# Patient Record
Sex: Female | Born: 1945 | Race: White | Hispanic: No | Marital: Married | Smoking: Former smoker
Health system: Southern US, Community
[De-identification: ages and names within clinical notes are randomized; demographics above are authoritative.]

## PROBLEM LIST (undated history)

## (undated) DIAGNOSIS — Z8719 Personal history of other diseases of the digestive system: Secondary | ICD-10-CM

## (undated) DIAGNOSIS — Z9981 Dependence on supplemental oxygen: Secondary | ICD-10-CM

## (undated) DIAGNOSIS — R0902 Hypoxemia: Secondary | ICD-10-CM

## (undated) DIAGNOSIS — J189 Pneumonia, unspecified organism: Secondary | ICD-10-CM

## (undated) DIAGNOSIS — T4145XA Adverse effect of unspecified anesthetic, initial encounter: Secondary | ICD-10-CM

## (undated) DIAGNOSIS — K219 Gastro-esophageal reflux disease without esophagitis: Secondary | ICD-10-CM

## (undated) DIAGNOSIS — M810 Age-related osteoporosis without current pathological fracture: Secondary | ICD-10-CM

## (undated) DIAGNOSIS — R519 Headache, unspecified: Secondary | ICD-10-CM

## (undated) DIAGNOSIS — T8859XA Other complications of anesthesia, initial encounter: Secondary | ICD-10-CM

## (undated) DIAGNOSIS — I700 Atherosclerosis of aorta: Secondary | ICD-10-CM

## (undated) DIAGNOSIS — I2510 Atherosclerotic heart disease of native coronary artery without angina pectoris: Secondary | ICD-10-CM

## (undated) DIAGNOSIS — E785 Hyperlipidemia, unspecified: Secondary | ICD-10-CM

## (undated) DIAGNOSIS — I10 Essential (primary) hypertension: Secondary | ICD-10-CM

## (undated) DIAGNOSIS — J439 Emphysema, unspecified: Secondary | ICD-10-CM

## (undated) DIAGNOSIS — M1990 Unspecified osteoarthritis, unspecified site: Secondary | ICD-10-CM

## (undated) DIAGNOSIS — K429 Umbilical hernia without obstruction or gangrene: Secondary | ICD-10-CM

## (undated) DIAGNOSIS — R0600 Dyspnea, unspecified: Secondary | ICD-10-CM

## (undated) DIAGNOSIS — J449 Chronic obstructive pulmonary disease, unspecified: Secondary | ICD-10-CM

## (undated) HISTORY — DX: Age-related osteoporosis without current pathological fracture: M81.0

## (undated) HISTORY — PX: ABDOMINAL HYSTERECTOMY: SHX81

## (undated) HISTORY — PX: EYE SURGERY: SHX253

## (undated) HISTORY — PX: FOOT SURGERY: SHX648

## (undated) HISTORY — DX: Essential (primary) hypertension: I10

## (undated) HISTORY — DX: Chronic obstructive pulmonary disease, unspecified: J44.9

## (undated) HISTORY — DX: Hyperlipidemia, unspecified: E78.5

## (undated) HISTORY — PX: CHOLECYSTECTOMY: SHX55

## (undated) HISTORY — DX: Unspecified osteoarthritis, unspecified site: M19.90

## (undated) HISTORY — DX: Emphysema, unspecified: J43.9

---

## 2006-05-24 ENCOUNTER — Ambulatory Visit: Payer: Self-pay | Admitting: Specialist

## 2006-06-10 ENCOUNTER — Ambulatory Visit: Payer: Self-pay | Admitting: Specialist

## 2007-01-27 ENCOUNTER — Ambulatory Visit: Payer: Self-pay | Admitting: Family Medicine

## 2007-03-25 ENCOUNTER — Ambulatory Visit: Payer: Self-pay | Admitting: Internal Medicine

## 2007-11-09 ENCOUNTER — Ambulatory Visit: Payer: Self-pay | Admitting: Internal Medicine

## 2008-12-05 ENCOUNTER — Ambulatory Visit: Payer: Self-pay | Admitting: Specialist

## 2009-01-25 ENCOUNTER — Ambulatory Visit: Payer: Self-pay | Admitting: Anesthesiology

## 2009-03-01 ENCOUNTER — Ambulatory Visit: Payer: Self-pay | Admitting: Anesthesiology

## 2009-03-28 ENCOUNTER — Ambulatory Visit: Payer: Self-pay | Admitting: Anesthesiology

## 2009-05-22 ENCOUNTER — Inpatient Hospital Stay: Payer: Self-pay | Admitting: Internal Medicine

## 2010-07-14 ENCOUNTER — Ambulatory Visit: Payer: Self-pay | Admitting: Internal Medicine

## 2011-04-21 DIAGNOSIS — H903 Sensorineural hearing loss, bilateral: Secondary | ICD-10-CM | POA: Diagnosis not present

## 2011-04-21 DIAGNOSIS — H60509 Unspecified acute noninfective otitis externa, unspecified ear: Secondary | ICD-10-CM | POA: Diagnosis not present

## 2011-04-21 DIAGNOSIS — H6120 Impacted cerumen, unspecified ear: Secondary | ICD-10-CM | POA: Diagnosis not present

## 2011-04-21 DIAGNOSIS — H9319 Tinnitus, unspecified ear: Secondary | ICD-10-CM | POA: Diagnosis not present

## 2011-05-07 DIAGNOSIS — J209 Acute bronchitis, unspecified: Secondary | ICD-10-CM | POA: Diagnosis not present

## 2011-05-07 DIAGNOSIS — J438 Other emphysema: Secondary | ICD-10-CM | POA: Diagnosis not present

## 2011-05-07 DIAGNOSIS — J449 Chronic obstructive pulmonary disease, unspecified: Secondary | ICD-10-CM | POA: Diagnosis not present

## 2011-06-16 ENCOUNTER — Ambulatory Visit: Payer: Self-pay | Admitting: Gastroenterology

## 2011-08-20 DIAGNOSIS — J438 Other emphysema: Secondary | ICD-10-CM | POA: Diagnosis not present

## 2011-08-20 DIAGNOSIS — J449 Chronic obstructive pulmonary disease, unspecified: Secondary | ICD-10-CM | POA: Diagnosis not present

## 2011-08-24 DIAGNOSIS — E785 Hyperlipidemia, unspecified: Secondary | ICD-10-CM | POA: Diagnosis not present

## 2011-08-24 DIAGNOSIS — I10 Essential (primary) hypertension: Secondary | ICD-10-CM | POA: Diagnosis not present

## 2011-08-24 DIAGNOSIS — N390 Urinary tract infection, site not specified: Secondary | ICD-10-CM | POA: Diagnosis not present

## 2012-01-29 DIAGNOSIS — N6489 Other specified disorders of breast: Secondary | ICD-10-CM | POA: Diagnosis not present

## 2012-01-29 DIAGNOSIS — R928 Other abnormal and inconclusive findings on diagnostic imaging of breast: Secondary | ICD-10-CM | POA: Diagnosis not present

## 2012-01-29 DIAGNOSIS — Z853 Personal history of malignant neoplasm of breast: Secondary | ICD-10-CM | POA: Diagnosis not present

## 2012-01-29 DIAGNOSIS — N6089 Other benign mammary dysplasias of unspecified breast: Secondary | ICD-10-CM | POA: Diagnosis not present

## 2012-02-29 ENCOUNTER — Ambulatory Visit: Payer: Self-pay | Admitting: Internal Medicine

## 2012-02-29 DIAGNOSIS — I10 Essential (primary) hypertension: Secondary | ICD-10-CM | POA: Diagnosis not present

## 2012-02-29 DIAGNOSIS — R0602 Shortness of breath: Secondary | ICD-10-CM | POA: Diagnosis not present

## 2012-02-29 DIAGNOSIS — J449 Chronic obstructive pulmonary disease, unspecified: Secondary | ICD-10-CM | POA: Diagnosis not present

## 2012-02-29 DIAGNOSIS — R079 Chest pain, unspecified: Secondary | ICD-10-CM | POA: Diagnosis not present

## 2012-02-29 DIAGNOSIS — J438 Other emphysema: Secondary | ICD-10-CM | POA: Diagnosis not present

## 2012-02-29 DIAGNOSIS — M810 Age-related osteoporosis without current pathological fracture: Secondary | ICD-10-CM | POA: Diagnosis not present

## 2012-02-29 DIAGNOSIS — E785 Hyperlipidemia, unspecified: Secondary | ICD-10-CM | POA: Diagnosis not present

## 2012-02-29 DIAGNOSIS — J4489 Other specified chronic obstructive pulmonary disease: Secondary | ICD-10-CM | POA: Diagnosis not present

## 2012-02-29 DIAGNOSIS — Z0000 Encounter for general adult medical examination without abnormal findings: Secondary | ICD-10-CM | POA: Diagnosis not present

## 2012-03-14 DIAGNOSIS — J438 Other emphysema: Secondary | ICD-10-CM | POA: Diagnosis not present

## 2012-03-14 DIAGNOSIS — J449 Chronic obstructive pulmonary disease, unspecified: Secondary | ICD-10-CM | POA: Diagnosis not present

## 2012-03-14 DIAGNOSIS — R079 Chest pain, unspecified: Secondary | ICD-10-CM | POA: Diagnosis not present

## 2012-03-14 DIAGNOSIS — R0602 Shortness of breath: Secondary | ICD-10-CM | POA: Diagnosis not present

## 2012-03-16 ENCOUNTER — Ambulatory Visit: Payer: Self-pay | Admitting: Internal Medicine

## 2012-03-16 DIAGNOSIS — K219 Gastro-esophageal reflux disease without esophagitis: Secondary | ICD-10-CM | POA: Diagnosis not present

## 2012-03-16 DIAGNOSIS — K449 Diaphragmatic hernia without obstruction or gangrene: Secondary | ICD-10-CM | POA: Diagnosis not present

## 2012-03-18 DIAGNOSIS — R079 Chest pain, unspecified: Secondary | ICD-10-CM | POA: Diagnosis not present

## 2012-03-18 DIAGNOSIS — R0602 Shortness of breath: Secondary | ICD-10-CM | POA: Diagnosis not present

## 2012-03-21 DIAGNOSIS — R079 Chest pain, unspecified: Secondary | ICD-10-CM | POA: Diagnosis not present

## 2012-03-21 DIAGNOSIS — R0602 Shortness of breath: Secondary | ICD-10-CM | POA: Diagnosis not present

## 2012-03-29 DIAGNOSIS — K219 Gastro-esophageal reflux disease without esophagitis: Secondary | ICD-10-CM | POA: Diagnosis not present

## 2012-03-29 DIAGNOSIS — J449 Chronic obstructive pulmonary disease, unspecified: Secondary | ICD-10-CM | POA: Diagnosis not present

## 2012-03-29 DIAGNOSIS — R079 Chest pain, unspecified: Secondary | ICD-10-CM | POA: Diagnosis not present

## 2012-03-29 DIAGNOSIS — R1012 Left upper quadrant pain: Secondary | ICD-10-CM | POA: Diagnosis not present

## 2012-04-19 DIAGNOSIS — R1012 Left upper quadrant pain: Secondary | ICD-10-CM | POA: Diagnosis not present

## 2012-04-28 DIAGNOSIS — N281 Cyst of kidney, acquired: Secondary | ICD-10-CM | POA: Diagnosis not present

## 2012-04-28 DIAGNOSIS — K219 Gastro-esophageal reflux disease without esophagitis: Secondary | ICD-10-CM | POA: Diagnosis not present

## 2012-04-28 DIAGNOSIS — J449 Chronic obstructive pulmonary disease, unspecified: Secondary | ICD-10-CM | POA: Diagnosis not present

## 2012-04-28 DIAGNOSIS — R1012 Left upper quadrant pain: Secondary | ICD-10-CM | POA: Diagnosis not present

## 2012-04-28 DIAGNOSIS — R079 Chest pain, unspecified: Secondary | ICD-10-CM | POA: Diagnosis not present

## 2012-05-03 ENCOUNTER — Ambulatory Visit: Payer: Self-pay | Admitting: Internal Medicine

## 2012-05-03 DIAGNOSIS — R1012 Left upper quadrant pain: Secondary | ICD-10-CM | POA: Diagnosis not present

## 2012-05-03 DIAGNOSIS — I2510 Atherosclerotic heart disease of native coronary artery without angina pectoris: Secondary | ICD-10-CM | POA: Diagnosis not present

## 2012-05-03 DIAGNOSIS — Z9889 Other specified postprocedural states: Secondary | ICD-10-CM | POA: Diagnosis not present

## 2012-05-03 DIAGNOSIS — J984 Other disorders of lung: Secondary | ICD-10-CM | POA: Diagnosis not present

## 2012-05-03 DIAGNOSIS — J8410 Pulmonary fibrosis, unspecified: Secondary | ICD-10-CM | POA: Diagnosis not present

## 2012-05-03 DIAGNOSIS — R079 Chest pain, unspecified: Secondary | ICD-10-CM | POA: Diagnosis not present

## 2012-05-09 DIAGNOSIS — E785 Hyperlipidemia, unspecified: Secondary | ICD-10-CM | POA: Diagnosis not present

## 2012-05-17 DIAGNOSIS — N281 Cyst of kidney, acquired: Secondary | ICD-10-CM | POA: Diagnosis not present

## 2012-05-17 DIAGNOSIS — J438 Other emphysema: Secondary | ICD-10-CM | POA: Diagnosis not present

## 2012-05-17 DIAGNOSIS — J449 Chronic obstructive pulmonary disease, unspecified: Secondary | ICD-10-CM | POA: Diagnosis not present

## 2012-08-23 DIAGNOSIS — I10 Essential (primary) hypertension: Secondary | ICD-10-CM | POA: Diagnosis not present

## 2012-08-23 DIAGNOSIS — E785 Hyperlipidemia, unspecified: Secondary | ICD-10-CM | POA: Diagnosis not present

## 2012-09-20 DIAGNOSIS — J019 Acute sinusitis, unspecified: Secondary | ICD-10-CM | POA: Diagnosis not present

## 2012-09-20 DIAGNOSIS — J449 Chronic obstructive pulmonary disease, unspecified: Secondary | ICD-10-CM | POA: Diagnosis not present

## 2012-09-21 ENCOUNTER — Ambulatory Visit: Payer: Self-pay | Admitting: Internal Medicine

## 2012-09-21 DIAGNOSIS — R918 Other nonspecific abnormal finding of lung field: Secondary | ICD-10-CM | POA: Diagnosis not present

## 2012-09-21 DIAGNOSIS — R05 Cough: Secondary | ICD-10-CM | POA: Diagnosis not present

## 2012-09-21 DIAGNOSIS — R059 Cough, unspecified: Secondary | ICD-10-CM | POA: Diagnosis not present

## 2012-10-27 DIAGNOSIS — N281 Cyst of kidney, acquired: Secondary | ICD-10-CM | POA: Diagnosis not present

## 2012-11-08 DIAGNOSIS — R0602 Shortness of breath: Secondary | ICD-10-CM | POA: Diagnosis not present

## 2012-11-08 DIAGNOSIS — J449 Chronic obstructive pulmonary disease, unspecified: Secondary | ICD-10-CM | POA: Diagnosis not present

## 2012-11-08 DIAGNOSIS — J438 Other emphysema: Secondary | ICD-10-CM | POA: Diagnosis not present

## 2012-11-08 DIAGNOSIS — N281 Cyst of kidney, acquired: Secondary | ICD-10-CM | POA: Diagnosis not present

## 2012-11-16 DIAGNOSIS — R0602 Shortness of breath: Secondary | ICD-10-CM | POA: Diagnosis not present

## 2013-03-06 DIAGNOSIS — Z0000 Encounter for general adult medical examination without abnormal findings: Secondary | ICD-10-CM | POA: Diagnosis not present

## 2013-03-06 DIAGNOSIS — J449 Chronic obstructive pulmonary disease, unspecified: Secondary | ICD-10-CM | POA: Diagnosis not present

## 2013-03-16 DIAGNOSIS — M810 Age-related osteoporosis without current pathological fracture: Secondary | ICD-10-CM | POA: Diagnosis not present

## 2013-03-16 DIAGNOSIS — N958 Other specified menopausal and perimenopausal disorders: Secondary | ICD-10-CM | POA: Diagnosis not present

## 2013-06-13 DIAGNOSIS — J438 Other emphysema: Secondary | ICD-10-CM | POA: Diagnosis not present

## 2013-06-13 DIAGNOSIS — J209 Acute bronchitis, unspecified: Secondary | ICD-10-CM | POA: Diagnosis not present

## 2013-06-13 DIAGNOSIS — R0602 Shortness of breath: Secondary | ICD-10-CM | POA: Diagnosis not present

## 2013-06-13 DIAGNOSIS — J449 Chronic obstructive pulmonary disease, unspecified: Secondary | ICD-10-CM | POA: Diagnosis not present

## 2013-08-08 DIAGNOSIS — M545 Low back pain, unspecified: Secondary | ICD-10-CM | POA: Diagnosis not present

## 2013-09-06 DIAGNOSIS — J449 Chronic obstructive pulmonary disease, unspecified: Secondary | ICD-10-CM | POA: Diagnosis not present

## 2013-09-06 DIAGNOSIS — E7800 Pure hypercholesterolemia, unspecified: Secondary | ICD-10-CM | POA: Diagnosis not present

## 2013-09-13 DIAGNOSIS — Z1231 Encounter for screening mammogram for malignant neoplasm of breast: Secondary | ICD-10-CM | POA: Diagnosis not present

## 2013-09-14 DIAGNOSIS — J449 Chronic obstructive pulmonary disease, unspecified: Secondary | ICD-10-CM | POA: Diagnosis not present

## 2013-09-14 DIAGNOSIS — J438 Other emphysema: Secondary | ICD-10-CM | POA: Diagnosis not present

## 2013-10-11 DIAGNOSIS — H353190 Nonexudative age-related macular degeneration, unspecified eye, stage unspecified: Secondary | ICD-10-CM | POA: Diagnosis not present

## 2013-10-11 DIAGNOSIS — Z961 Presence of intraocular lens: Secondary | ICD-10-CM | POA: Diagnosis not present

## 2013-12-19 DIAGNOSIS — J449 Chronic obstructive pulmonary disease, unspecified: Secondary | ICD-10-CM | POA: Diagnosis not present

## 2013-12-19 DIAGNOSIS — J438 Other emphysema: Secondary | ICD-10-CM | POA: Diagnosis not present

## 2013-12-19 DIAGNOSIS — J069 Acute upper respiratory infection, unspecified: Secondary | ICD-10-CM | POA: Diagnosis not present

## 2013-12-26 DIAGNOSIS — J438 Other emphysema: Secondary | ICD-10-CM | POA: Diagnosis not present

## 2013-12-26 DIAGNOSIS — J301 Allergic rhinitis due to pollen: Secondary | ICD-10-CM | POA: Diagnosis not present

## 2013-12-26 DIAGNOSIS — J449 Chronic obstructive pulmonary disease, unspecified: Secondary | ICD-10-CM | POA: Diagnosis not present

## 2014-01-24 DIAGNOSIS — R0602 Shortness of breath: Secondary | ICD-10-CM | POA: Diagnosis not present

## 2014-01-29 DIAGNOSIS — J449 Chronic obstructive pulmonary disease, unspecified: Secondary | ICD-10-CM | POA: Diagnosis not present

## 2014-01-29 DIAGNOSIS — J439 Emphysema, unspecified: Secondary | ICD-10-CM | POA: Diagnosis not present

## 2014-03-13 LAB — LIPID PANEL
Cholesterol: 269 mg/dL — AB (ref 0–200)
HDL: 89 mg/dL — AB (ref 35–70)
LDL Cholesterol: 151 mg/dL
Triglycerides: 144 mg/dL (ref 40–160)

## 2014-03-13 LAB — CBC AND DIFFERENTIAL
HCT: 44 % (ref 36–46)
Hemoglobin: 14.2 g/dL (ref 12.0–16.0)
Platelets: 338 10*3/uL (ref 150–399)
WBC: 8.2 10*3/mL

## 2014-03-13 LAB — BASIC METABOLIC PANEL
CREATININE: 0.8 mg/dL (ref <=1.1)
GLUCOSE: 83 mg/dL
Potassium: 4.6 mmol/L (ref 3.4–5.3)
Sodium: 134 mmol/L — AB (ref 137–147)

## 2014-03-13 LAB — HEPATIC FUNCTION PANEL
ALK PHOS: 73 U/L (ref 25–125)
ALT: 15 U/L (ref 7–35)
AST: 21 U/L (ref 13–35)

## 2014-03-13 LAB — TSH: TSH: 1.97 u[IU]/mL (ref <=5.90)

## 2014-04-25 DIAGNOSIS — R0602 Shortness of breath: Secondary | ICD-10-CM | POA: Diagnosis not present

## 2014-04-25 DIAGNOSIS — J441 Chronic obstructive pulmonary disease with (acute) exacerbation: Secondary | ICD-10-CM | POA: Diagnosis not present

## 2014-04-30 DIAGNOSIS — J449 Chronic obstructive pulmonary disease, unspecified: Secondary | ICD-10-CM | POA: Diagnosis not present

## 2014-06-12 ENCOUNTER — Ambulatory Visit: Payer: Self-pay | Admitting: Internal Medicine

## 2014-06-12 DIAGNOSIS — R05 Cough: Secondary | ICD-10-CM | POA: Diagnosis not present

## 2014-06-12 DIAGNOSIS — J449 Chronic obstructive pulmonary disease, unspecified: Secondary | ICD-10-CM | POA: Diagnosis not present

## 2014-06-12 DIAGNOSIS — J441 Chronic obstructive pulmonary disease with (acute) exacerbation: Secondary | ICD-10-CM | POA: Diagnosis not present

## 2014-06-12 DIAGNOSIS — J209 Acute bronchitis, unspecified: Secondary | ICD-10-CM | POA: Diagnosis not present

## 2014-06-12 DIAGNOSIS — R0602 Shortness of breath: Secondary | ICD-10-CM | POA: Diagnosis not present

## 2014-06-19 DIAGNOSIS — J209 Acute bronchitis, unspecified: Secondary | ICD-10-CM | POA: Diagnosis not present

## 2014-06-19 DIAGNOSIS — R0602 Shortness of breath: Secondary | ICD-10-CM | POA: Diagnosis not present

## 2014-06-19 DIAGNOSIS — J449 Chronic obstructive pulmonary disease, unspecified: Secondary | ICD-10-CM | POA: Diagnosis not present

## 2014-06-19 DIAGNOSIS — R079 Chest pain, unspecified: Secondary | ICD-10-CM | POA: Diagnosis not present

## 2014-09-12 ENCOUNTER — Encounter: Payer: Self-pay | Admitting: Family Medicine

## 2014-09-12 ENCOUNTER — Ambulatory Visit (INDEPENDENT_AMBULATORY_CARE_PROVIDER_SITE_OTHER): Payer: BLUE CROSS/BLUE SHIELD | Admitting: Family Medicine

## 2014-09-12 VITALS — BP 138/82 | HR 87 | Temp 97.9°F | Ht 63.0 in | Wt 135.0 lb

## 2014-09-12 DIAGNOSIS — E785 Hyperlipidemia, unspecified: Secondary | ICD-10-CM

## 2014-09-12 DIAGNOSIS — F322 Major depressive disorder, single episode, severe without psychotic features: Secondary | ICD-10-CM

## 2014-09-12 DIAGNOSIS — J441 Chronic obstructive pulmonary disease with (acute) exacerbation: Secondary | ICD-10-CM

## 2014-09-12 DIAGNOSIS — I10 Essential (primary) hypertension: Secondary | ICD-10-CM | POA: Diagnosis not present

## 2014-09-12 DIAGNOSIS — J440 Chronic obstructive pulmonary disease with acute lower respiratory infection: Secondary | ICD-10-CM

## 2014-09-12 DIAGNOSIS — M810 Age-related osteoporosis without current pathological fracture: Secondary | ICD-10-CM | POA: Diagnosis not present

## 2014-09-12 DIAGNOSIS — J209 Acute bronchitis, unspecified: Secondary | ICD-10-CM

## 2014-09-12 DIAGNOSIS — F329 Major depressive disorder, single episode, unspecified: Secondary | ICD-10-CM

## 2014-09-12 MED ORDER — ROSUVASTATIN CALCIUM 20 MG PO TABS: 20.0000 mg | ORAL_TABLET | Freq: Every day | ORAL | Status: DC

## 2014-09-12 MED ORDER — TRAMADOL HCL 50 MG PO TABS: 50.0000 mg | ORAL_TABLET | Freq: Four times a day (QID) | ORAL | Status: DC | PRN

## 2014-09-12 NOTE — Progress Notes (Signed)
BP 157/78 mmHg  Pulse 87  Temp(Src) 97.9 F (36.6 C) (Oral)  Ht 5\' 3"  (1.6 m)  Wt 135 lb (61.236 kg)  BMI 23.92 kg/m2   Subjective:    Patient ID: Joyce Rodriguez, female    DOB: [birth year], 69 y.o.   MRN: [id number]  HPI: Keyah Blizard is a 69 y.o. female presenting on 09/12/2014 for Medication Refill   Hypertension This is a chronic problem. The problem is controlled. Pertinent negatives include no headaches, malaise/fatigue, palpitations or peripheral edema. There are no compliance problems.   Hyperlipidemia This is a chronic problem. The problem is controlled. Recent lipid tests were reviewed and are normal. Pertinent negatives include no leg pain or myalgias. There are no compliance problems.   COPD stable followed by pulmonary who rx depression meds also and doing well  Relevant past medical, surgical, family and social history reviewed and updated as indicated. Interim medical history since our last visit reviewed. Allergies and medications reviewed and updated.  Current Outpatient Prescriptions on File Prior to Visit  Medication Sig  . albuterol (PROVENTIL HFA;VENTOLIN HFA) 108 (90 BASE) MCG/ACT inhaler Inhale 2 puffs into the lungs every 6 (six) hours as needed for shortness of breath.  Marland Kitchen aspirin EC 81 MG tablet Take 81 mg by mouth daily.  Marland Kitchen escitalopram (LEXAPRO) 20 MG tablet Take 20 mg by mouth daily.  . Fluticasone-Salmeterol (ADVAIR) 250-50 MCG/DOSE AEPB Inhale 1 puff into the lungs 2 (two) times daily.  . raloxifene (EVISTA) 60 MG tablet Take 60 mg by mouth daily.  Marland Kitchen tiotropium (SPIRIVA) 18 MCG inhalation capsule Place 18 mcg into inhaler and inhale daily.   No current facility-administered medications on file prior to visit.    Review of Systems  Constitutional: Negative for malaise/fatigue.  Cardiovascular: Negative for palpitations.  Musculoskeletal: Negative for myalgias.  Neurological: Negative for headaches.    Per HPI unless specifically indicated  above     Objective:    BP 157/78 mmHg  Pulse 87  Temp(Src) 97.9 F (36.6 C) (Oral)  Ht 5\' 3"  (1.6 m)  Wt 135 lb (61.236 kg)  BMI 23.92 kg/m2  Wt Readings from Last 3 Encounters:  09/12/14 135 lb (61.236 kg)  03/13/14 132 lb (59.875 kg)  03/13/14 132 lb (59.875 kg)    Physical Exam  Constitutional: She is oriented to person, place, and time. She appears well-developed and well-nourished.  Cardiovascular: Normal rate and regular rhythm.   Pulmonary/Chest: Breath sounds normal.  Abdominal: Soft.  Musculoskeletal: She exhibits no edema.  Neurological: She is alert and oriented to person, place, and time.  Skin: Skin is warm and dry.  Psychiatric: She has a normal mood and affect. Her behavior is normal. Judgment and thought content normal.        Assessment & Plan:   Problem List Items Addressed This Visit    None    Visit Diagnoses    Benign hypertension    -  Primary    Relevant Medications    rosuvastatin (CRESTOR) 20 MG tablet    Other Relevant Orders    Basic metabolic panel    Hyperlipemia        Relevant Medications    rosuvastatin (CRESTOR) 20 MG tablet    Other Relevant Orders    ALT    AST    Lipid panel        Meds ordered this encounter  Medications  . rosuvastatin (CRESTOR) 20 MG tablet    Sig: Take 1 tablet (20  mg total) by mouth daily.    Dispense:  90 tablet    Refill:  1  . traMADol (ULTRAM) 50 MG tablet    Sig: Take 1 tablet (50 mg total) by mouth every 6 (six) hours as needed (1-2 tabs).    Dispense:  100 tablet    Refill:  1    Follow up plan: No Follow-up on file.

## 2014-09-13 LAB — BASIC METABOLIC PANEL
BUN/Creatinine Ratio: 15 (ref 11–26)
BUN: 10 mg/dL (ref 8–27)
CALCIUM: 9.7 mg/dL (ref 8.7–10.3)
CO2: 28 mmol/L (ref 18–29)
Chloride: 99 mmol/L (ref 97–108)
Creatinine, Ser: 0.66 mg/dL (ref 0.57–1.00)
GFR calc non Af Amer: 92 mL/min/{1.73_m2} (ref >59)
GFR, EST AFRICAN AMERICAN: 106 mL/min/{1.73_m2} (ref >59)
Glucose: 83 mg/dL (ref 65–99)
Potassium: 4.5 mmol/L (ref 3.5–5.2)
SODIUM: 140 mmol/L (ref 134–144)

## 2014-09-13 LAB — LIPID PANEL
CHOL/HDL RATIO: 2 ratio (ref 0.0–4.4)
Cholesterol, Total: 168 mg/dL (ref 100–199)
HDL: 84 mg/dL (ref >39)
LDL Calculated: 67 mg/dL (ref 0–99)
TRIGLYCERIDES: 86 mg/dL (ref 0–149)
VLDL CHOLESTEROL CAL: 17 mg/dL (ref 5–40)

## 2014-09-13 LAB — AST: AST: 22 IU/L (ref 0–40)

## 2014-09-13 LAB — ALT: ALT: 12 IU/L (ref 0–32)

## 2014-09-18 DIAGNOSIS — J439 Emphysema, unspecified: Secondary | ICD-10-CM | POA: Diagnosis not present

## 2014-09-18 DIAGNOSIS — J449 Chronic obstructive pulmonary disease, unspecified: Secondary | ICD-10-CM | POA: Diagnosis not present

## 2014-09-18 DIAGNOSIS — K219 Gastro-esophageal reflux disease without esophagitis: Secondary | ICD-10-CM | POA: Diagnosis not present

## 2014-10-16 DIAGNOSIS — H3531 Nonexudative age-related macular degeneration: Secondary | ICD-10-CM | POA: Diagnosis not present

## 2014-10-16 DIAGNOSIS — Z961 Presence of intraocular lens: Secondary | ICD-10-CM | POA: Diagnosis not present

## 2014-11-12 DIAGNOSIS — H35342 Macular cyst, hole, or pseudohole, left eye: Secondary | ICD-10-CM | POA: Diagnosis not present

## 2014-11-12 DIAGNOSIS — H3531 Nonexudative age-related macular degeneration: Secondary | ICD-10-CM | POA: Diagnosis not present

## 2014-11-12 DIAGNOSIS — H43813 Vitreous degeneration, bilateral: Secondary | ICD-10-CM | POA: Diagnosis not present

## 2014-11-12 DIAGNOSIS — H35372 Puckering of macula, left eye: Secondary | ICD-10-CM | POA: Diagnosis not present

## 2015-02-07 DIAGNOSIS — J9611 Chronic respiratory failure with hypoxia: Secondary | ICD-10-CM | POA: Diagnosis not present

## 2015-02-07 DIAGNOSIS — R0602 Shortness of breath: Secondary | ICD-10-CM | POA: Diagnosis not present

## 2015-02-07 DIAGNOSIS — J449 Chronic obstructive pulmonary disease, unspecified: Secondary | ICD-10-CM | POA: Diagnosis not present

## 2015-02-14 DIAGNOSIS — H35342 Macular cyst, hole, or pseudohole, left eye: Secondary | ICD-10-CM | POA: Diagnosis not present

## 2015-02-14 DIAGNOSIS — H35372 Puckering of macula, left eye: Secondary | ICD-10-CM | POA: Diagnosis not present

## 2015-02-14 DIAGNOSIS — H353132 Nonexudative age-related macular degeneration, bilateral, intermediate dry stage: Secondary | ICD-10-CM | POA: Diagnosis not present

## 2015-03-19 ENCOUNTER — Encounter: Payer: Self-pay | Admitting: Family Medicine

## 2015-03-19 ENCOUNTER — Ambulatory Visit (INDEPENDENT_AMBULATORY_CARE_PROVIDER_SITE_OTHER): Payer: BLUE CROSS/BLUE SHIELD | Admitting: Family Medicine

## 2015-03-19 VITALS — BP 148/76 | HR 92 | Temp 98.2°F | Ht 62.5 in | Wt 138.0 lb

## 2015-03-19 DIAGNOSIS — M19042 Primary osteoarthritis, left hand: Secondary | ICD-10-CM | POA: Diagnosis not present

## 2015-03-19 DIAGNOSIS — M19041 Primary osteoarthritis, right hand: Secondary | ICD-10-CM | POA: Diagnosis not present

## 2015-03-19 DIAGNOSIS — F329 Major depressive disorder, single episode, unspecified: Secondary | ICD-10-CM | POA: Diagnosis not present

## 2015-03-19 DIAGNOSIS — M810 Age-related osteoporosis without current pathological fracture: Secondary | ICD-10-CM

## 2015-03-19 DIAGNOSIS — J410 Simple chronic bronchitis: Secondary | ICD-10-CM | POA: Diagnosis not present

## 2015-03-19 DIAGNOSIS — E785 Hyperlipidemia, unspecified: Secondary | ICD-10-CM | POA: Diagnosis not present

## 2015-03-19 DIAGNOSIS — I10 Essential (primary) hypertension: Secondary | ICD-10-CM

## 2015-03-19 DIAGNOSIS — Z0000 Encounter for general adult medical examination without abnormal findings: Secondary | ICD-10-CM | POA: Diagnosis not present

## 2015-03-19 DIAGNOSIS — J449 Chronic obstructive pulmonary disease, unspecified: Secondary | ICD-10-CM | POA: Insufficient documentation

## 2015-03-19 DIAGNOSIS — K219 Gastro-esophageal reflux disease without esophagitis: Secondary | ICD-10-CM

## 2015-03-19 LAB — URINALYSIS, ROUTINE W REFLEX MICROSCOPIC
Bilirubin, UA: NEGATIVE
Glucose, UA: NEGATIVE
KETONES UA: NEGATIVE
Nitrite, UA: NEGATIVE
Protein, UA: NEGATIVE
RBC, UA: NEGATIVE
Urobilinogen, Ur: 0.2 mg/dL (ref 0.2–1.0)
pH, UA: 5 (ref 5.0–7.5)

## 2015-03-19 LAB — MICROSCOPIC EXAMINATION

## 2015-03-19 MED ORDER — TRAMADOL HCL 50 MG PO TABS: 50.0000 mg | ORAL_TABLET | Freq: Four times a day (QID) | ORAL | Status: DC | PRN

## 2015-03-19 MED ORDER — RALOXIFENE HCL 60 MG PO TABS: 60.0000 mg | ORAL_TABLET | Freq: Every day | ORAL | Status: DC

## 2015-03-19 MED ORDER — ROSUVASTATIN CALCIUM 20 MG PO TABS: 20.0000 mg | ORAL_TABLET | Freq: Every day | ORAL | Status: DC

## 2015-03-19 MED ORDER — ESCITALOPRAM OXALATE 20 MG PO TABS: 20.0000 mg | ORAL_TABLET | Freq: Every day | ORAL | Status: DC

## 2015-03-19 MED ORDER — PANTOPRAZOLE SODIUM 40 MG PO TBEC: 40.0000 mg | DELAYED_RELEASE_TABLET | Freq: Every day | ORAL | Status: DC

## 2015-03-19 MED ORDER — LOSARTAN POTASSIUM 100 MG PO TABS
100.0000 mg | ORAL_TABLET | Freq: Every day | ORAL | Status: DC
Start: 2015-03-19 — End: 2015-11-07

## 2015-03-19 NOTE — Assessment & Plan Note (Signed)
Followed by pulmonary 

## 2015-03-19 NOTE — Assessment & Plan Note (Signed)
Patient's developed hypertension again will start medication patient education given on medication Will check EKG next visit

## 2015-03-19 NOTE — Assessment & Plan Note (Signed)
The current medical regimen is effective;  continue present plan and medications.  

## 2015-03-19 NOTE — Assessment & Plan Note (Signed)
Has been out of medications and bothered by a lot of reflux symptoms will get started again

## 2015-03-19 NOTE — Assessment & Plan Note (Signed)
Managed with tramadol 50 mg daily

## 2015-03-19 NOTE — Assessment & Plan Note (Signed)
Stable on medications

## 2015-03-19 NOTE — Progress Notes (Signed)
BP 148/76 mmHg  Pulse 92  Temp(Src) 98.2 F (36.8 C)  Ht 5' 2.5" (1.588 m)  Wt 138 lb (62.596 kg)  BMI 24.82 kg/m2  SpO2 99%   Subjective:    Patient ID: Joyce Rodriguez, female    DOB: 1946/04/12, 69 y.o.   MRN: [id number]  HPI: Joyce Rodriguez is a 69 y.o. female  Chief Complaint  Patient presents with  . Annual Exam  . Gastroesophageal Reflux   patient follow-up medical problems also. Follow by pulmonary for COPD and stable. Has been checking blood pressure at home over the last month or so off and on and other places blood pressures been elevated as it is here today. Patient with no symptoms or complaints Takes cholesterol medicine without problems Takes tramadol 50 mg daily for hand arthritis primarily to control her arthritis pretty well.  Relevant past medical, surgical, family and social history reviewed and updated as indicated. Interim medical history since our last visit reviewed. Allergies and medications reviewed and updated.  Review of Systems  Constitutional: Negative.   HENT: Negative.   Eyes: Negative.   Respiratory: Positive for shortness of breath.   Cardiovascular: Negative.   Gastrointestinal: Negative.   Endocrine: Negative.   Genitourinary: Negative.   Musculoskeletal: Negative.   Skin: Negative.   Allergic/Immunologic: Negative.   Neurological: Negative.   Hematological: Negative.   Psychiatric/Behavioral: Negative.     Per HPI unless specifically indicated above     Objective:    BP 148/76 mmHg  Pulse 92  Temp(Src) 98.2 F (36.8 C)  Ht 5' 2.5" (1.588 m)  Wt 138 lb (62.596 kg)  BMI 24.82 kg/m2  SpO2 99%  Wt Readings from Last 3 Encounters:  03/19/15 138 lb (62.596 kg)  09/12/14 135 lb (61.236 kg)  03/13/14 132 lb (59.875 kg)    Physical Exam  Constitutional: She is oriented to person, place, and time. She appears well-developed and well-nourished.  HENT:  Head: Normocephalic and atraumatic.  Right Ear: External ear normal.   Left Ear: External ear normal.  Nose: Nose normal.  Mouth/Throat: Oropharynx is clear and moist.  Eyes: Conjunctivae and EOM are normal. Pupils are equal, round, and reactive to light.  Neck: Normal range of motion. Neck supple. Carotid bruit is not present.  Cardiovascular: Normal rate, regular rhythm and normal heart sounds.   No murmur heard. Pulmonary/Chest: Effort normal and breath sounds normal. She exhibits no mass. Right breast exhibits no mass, no skin change and no tenderness. Left breast exhibits no mass, no skin change and no tenderness. Breasts are symmetrical.  Abdominal: Soft. Bowel sounds are normal. There is no hepatosplenomegaly.  Musculoskeletal: Normal range of motion.  Neurological: She is alert and oriented to person, place, and time.  Skin: No rash noted.  Psychiatric: She has a normal mood and affect. Her behavior is normal. Judgment and thought content normal.    Results for orders placed or performed in visit on [id number]  Basic metabolic panel  Result Value Ref Range   Glucose 83 65 - 99 mg/dL   BUN 10 8 - 27 mg/dL   Creatinine, Ser 0.66 0.57 - 1.00 mg/dL   GFR calc non Af Amer 92 >59 mL/min/1.73   GFR calc Af Amer 106 >59 mL/min/1.73   BUN/Creatinine Ratio 15 11 - 26   Sodium 140 134 - 144 mmol/L   Potassium 4.5 3.5 - 5.2 mmol/L   Chloride 99 97 - 108 mmol/L   CO2 28 18 - 29 mmol/L  Calcium 9.7 8.7 - 10.3 mg/dL  ALT  Result Value Ref Range   ALT 12 0 - 32 IU/L  AST  Result Value Ref Range   AST 22 0 - 40 IU/L  Lipid panel  Result Value Ref Range   Cholesterol, Total 168 100 - 199 mg/dL   Triglycerides 86 0 - 149 mg/dL   HDL 84 >39 mg/dL   VLDL Cholesterol Cal 17 5 - 40 mg/dL   LDL Calculated 67 0 - 99 mg/dL   Chol/HDL Ratio 2.0 0.0 - 4.4 ratio units      Assessment & Plan:   Problem List Items Addressed This Visit      Cardiovascular and Mediastinum   Essential hypertension - Primary    Patient's developed hypertension again will start  medication patient education given on medication Will check EKG next visit       Relevant Medications   rosuvastatin (CRESTOR) 20 MG tablet   losartan (COZAAR) 100 MG tablet   Other Relevant Orders   Comprehensive metabolic panel   CBC with Differential/Platelet   TSH   Urinalysis, Routine w reflex microscopic (not at [facility])     Respiratory   COPD (chronic obstructive pulmonary disease) (HCC)    Followed by pulmonary        Digestive   Acid reflux    Has been out of medications and bothered by a lot of reflux symptoms will get started again      Relevant Medications   pantoprazole (PROTONIX) 40 MG tablet     Musculoskeletal and Integument   Osteoporosis    The current medical regimen is effective;  continue present plan and medications.       Relevant Medications   raloxifene (EVISTA) 60 MG tablet   Other Relevant Orders   TSH   Urinalysis, Routine w reflex microscopic (not at [facility])   Osteoarthritis of both hands    Managed with tramadol 50 mg daily      Relevant Medications   traMADol (ULTRAM) 50 MG tablet   Other Relevant Orders   TSH   Urinalysis, Routine w reflex microscopic (not at [facility])     Other   Major depression, chronic (HCC) (Chronic)    Stable on medications      Relevant Medications   escitalopram (LEXAPRO) 20 MG tablet   Other Relevant Orders   CBC with Differential/Platelet   TSH   Urinalysis, Routine w reflex microscopic (not at [facility])   Hyperlipidemia    The current medical regimen is effective;  continue present plan and medications.       Relevant Medications   rosuvastatin (CRESTOR) 20 MG tablet   losartan (COZAAR) 100 MG tablet   Other Relevant Orders   Comprehensive metabolic panel   Lipid panel   CBC with Differential/Platelet   TSH   Urinalysis, Routine w reflex microscopic (not at [facility])    Other Visit Diagnoses    PE (physical exam), annual            Follow up plan: Return in about 4 weeks (around 04/16/2015) for  BP check, EKG, BMP.

## 2015-03-20 ENCOUNTER — Encounter: Payer: Self-pay | Admitting: Family Medicine

## 2015-03-20 LAB — CBC WITH DIFFERENTIAL/PLATELET
BASOS ABS: 0.1 10*3/uL (ref 0.0–0.2)
Basos: 1 %
EOS (ABSOLUTE): 0.3 10*3/uL (ref 0.0–0.4)
Eos: 4 %
Hematocrit: 41.3 % (ref 34.0–46.6)
Hemoglobin: 13.5 g/dL (ref 11.1–15.9)
Immature Grans (Abs): 0 10*3/uL (ref 0.0–0.1)
Immature Granulocytes: 0 %
LYMPHS ABS: 1.8 10*3/uL (ref 0.7–3.1)
Lymphs: 23 %
MCH: 31.1 pg (ref 26.6–33.0)
MCHC: 32.7 g/dL (ref 31.5–35.7)
MCV: 95 fL (ref 79–97)
MONOS ABS: 0.8 10*3/uL (ref 0.1–0.9)
Monocytes: 10 %
Neutrophils Absolute: 4.8 10*3/uL (ref 1.4–7.0)
Neutrophils: 62 %
Platelets: 314 10*3/uL (ref 150–379)
RBC: 4.34 x10E6/uL (ref 3.77–5.28)
RDW: 13 % (ref 12.3–15.4)
WBC: 7.7 10*3/uL (ref 3.4–10.8)

## 2015-03-20 LAB — LIPID PANEL
CHOL/HDL RATIO: 2.1 ratio (ref 0.0–4.4)
Cholesterol, Total: 179 mg/dL (ref 100–199)
HDL: 84 mg/dL (ref >39)
LDL CALC: 76 mg/dL (ref 0–99)
Triglycerides: 96 mg/dL (ref 0–149)
VLDL CHOLESTEROL CAL: 19 mg/dL (ref 5–40)

## 2015-03-20 LAB — COMPREHENSIVE METABOLIC PANEL
A/G RATIO: 2 (ref 1.1–2.5)
ALT: 22 IU/L (ref 0–32)
AST: 26 IU/L (ref 0–40)
Albumin: 4.5 g/dL (ref 3.6–4.8)
Alkaline Phosphatase: 65 IU/L (ref 39–117)
BUN/Creatinine Ratio: 16 (ref 11–26)
BUN: 11 mg/dL (ref 8–27)
Bilirubin Total: 0.4 mg/dL (ref 0.0–1.2)
CALCIUM: 9.7 mg/dL (ref 8.7–10.3)
CO2: 26 mmol/L (ref 18–29)
Chloride: 99 mmol/L (ref 97–106)
Creatinine, Ser: 0.68 mg/dL (ref 0.57–1.00)
GFR, EST AFRICAN AMERICAN: 104 mL/min/{1.73_m2} (ref >59)
GFR, EST NON AFRICAN AMERICAN: 90 mL/min/{1.73_m2} (ref >59)
Globulin, Total: 2.2 g/dL (ref 1.5–4.5)
Glucose: 86 mg/dL (ref 65–99)
POTASSIUM: 3.9 mmol/L (ref 3.5–5.2)
Sodium: 140 mmol/L (ref 136–144)
TOTAL PROTEIN: 6.7 g/dL (ref 6.0–8.5)

## 2015-03-20 LAB — TSH: TSH: 1.55 u[IU]/mL (ref 0.450–4.500)

## 2015-03-28 ENCOUNTER — Telehealth: Payer: Self-pay | Admitting: Family Medicine

## 2015-03-28 DIAGNOSIS — M19041 Primary osteoarthritis, right hand: Secondary | ICD-10-CM

## 2015-03-28 DIAGNOSIS — M19042 Primary osteoarthritis, left hand: Principal | ICD-10-CM

## 2015-03-28 MED ORDER — TRAMADOL HCL 50 MG PO TABS: ORAL_TABLET | ORAL | Status: DC

## 2015-03-28 NOTE — Telephone Encounter (Signed)
Printed and up front to be faxed.

## 2015-03-28 NOTE — Telephone Encounter (Signed)
Pt called stated she is almost out of her Tramadol. Pt stated the pharmacy told her they needed more information from [place] but there is no record of correspondence from the pharmacy. Pharm is [facility]. Please call [facility] @ [phone] to get this straightened out for the patient. Thanks.

## 2015-03-28 NOTE — Telephone Encounter (Signed)
Rx for Tramadol needs to be printed off to fax to [facility] [phone]

## 2015-04-16 ENCOUNTER — Encounter: Payer: Self-pay | Admitting: Family Medicine

## 2015-04-16 ENCOUNTER — Ambulatory Visit (INDEPENDENT_AMBULATORY_CARE_PROVIDER_SITE_OTHER): Payer: BLUE CROSS/BLUE SHIELD | Admitting: Family Medicine

## 2015-04-16 VITALS — BP 133/80 | HR 91 | Temp 98.0°F | Ht 62.0 in | Wt 136.6 lb

## 2015-04-16 DIAGNOSIS — J410 Simple chronic bronchitis: Secondary | ICD-10-CM

## 2015-04-16 DIAGNOSIS — I10 Essential (primary) hypertension: Secondary | ICD-10-CM | POA: Diagnosis not present

## 2015-04-16 DIAGNOSIS — M19042 Primary osteoarthritis, left hand: Secondary | ICD-10-CM

## 2015-04-16 DIAGNOSIS — M19041 Primary osteoarthritis, right hand: Secondary | ICD-10-CM | POA: Diagnosis not present

## 2015-04-16 NOTE — Assessment & Plan Note (Signed)
Stable

## 2015-04-16 NOTE — Progress Notes (Signed)
BP 133/80 mmHg  Pulse 91  Temp(Src) 98 F (36.7 C)  Ht 5\' 2"  (1.575 m)  Wt 136 lb 9.6 oz (61.961 kg)  BMI 24.98 kg/m2  SpO2 93%   Subjective:    Patient ID: Joyce Rodriguez, female    DOB: 11-07-1945, 70 y.o.   MRN: [id number]  HPI: Joyce Rodriguez is a 70 y.o. female  Chief Complaint  Patient presents with  . Follow-up  . EKG  . Hypertension   Patient follow-up hypertension doing well with medications no complaints taking faithfully without side effects taken every day Breathing has been okay with this weather change Arthritis is been more bothersome especially in her hands Depression stable through the holiday season Relevant past medical, surgical, family and social history reviewed and updated as indicated. Interim medical history since our last visit reviewed. Allergies and medications reviewed and updated.  Review of Systems  Constitutional: Negative.   Respiratory: Negative.   Cardiovascular: Negative.     Per HPI unless specifically indicated above     Objective:    BP 133/80 mmHg  Pulse 91  Temp(Src) 98 F (36.7 C)  Ht 5\' 2"  (1.575 m)  Wt 136 lb 9.6 oz (61.961 kg)  BMI 24.98 kg/m2  SpO2 93%  Wt Readings from Last 3 Encounters:  04/16/15 136 lb 9.6 oz (61.961 kg)  03/19/15 138 lb (62.596 kg)  09/12/14 135 lb (61.236 kg)    Physical Exam  Constitutional: She is oriented to person, place, and time. She appears well-developed and well-nourished. No distress.  HENT:  Head: Normocephalic and atraumatic.  Right Ear: Hearing normal.  Left Ear: Hearing normal.  Nose: Nose normal.  Eyes: Conjunctivae and lids are normal. Right eye exhibits no discharge. Left eye exhibits no discharge. No scleral icterus.  Cardiovascular: Normal rate, regular rhythm and normal heart sounds.   Pulmonary/Chest: Effort normal. No respiratory distress. She has no wheezes. She has no rales.  Musculoskeletal: Normal range of motion.  Neurological: She is alert and oriented to  person, place, and time.  Skin: Skin is intact. No rash noted.  Psychiatric: She has a normal mood and affect. Her speech is normal and behavior is normal. Judgment and thought content normal. Cognition and memory are normal.    Results for orders placed or performed in visit on 03/19/15  Microscopic Examination  Result Value Ref Range   WBC, UA 0-5 0 -  5 /hpf   RBC, UA 0-2 0 -  2 /hpf   Epithelial Cells (non renal) 0-10 0 - 10 /hpf   Mucus, UA Present Not Estab.   Bacteria, UA Few None seen/Few  Comprehensive metabolic panel  Result Value Ref Range   Glucose 86 65 - 99 mg/dL   BUN 11 8 - 27 mg/dL   Creatinine, Ser 0.68 0.57 - 1.00 mg/dL   GFR calc non Af Amer 90 >59 mL/min/1.73   GFR calc Af Amer 104 >59 mL/min/1.73   BUN/Creatinine Ratio 16 11 - 26   Sodium 140 136 - 144 mmol/L   Potassium 3.9 3.5 - 5.2 mmol/L   Chloride 99 97 - 106 mmol/L   CO2 26 18 - 29 mmol/L   Calcium 9.7 8.7 - 10.3 mg/dL   Total Protein 6.7 6.0 - 8.5 g/dL   Albumin 4.5 3.6 - 4.8 g/dL   Globulin, Total 2.2 1.5 - 4.5 g/dL   Albumin/Globulin Ratio 2.0 1.1 - 2.5   Bilirubin Total 0.4 0.0 - 1.2 mg/dL   Alkaline Phosphatase  65 39 - 117 IU/L   AST 26 0 - 40 IU/L   ALT 22 0 - 32 IU/L  Lipid panel  Result Value Ref Range   Cholesterol, Total 179 100 - 199 mg/dL   Triglycerides 96 0 - 149 mg/dL   HDL 84 >39 mg/dL   VLDL Cholesterol Cal 19 5 - 40 mg/dL   LDL Calculated 76 0 - 99 mg/dL   Chol/HDL Ratio 2.1 0.0 - 4.4 ratio units  CBC with Differential/Platelet  Result Value Ref Range   WBC 7.7 3.4 - 10.8 x10E3/uL   RBC 4.34 3.77 - 5.28 x10E6/uL   Hemoglobin 13.5 11.1 - 15.9 g/dL   Hematocrit 41.3 34.0 - 46.6 %   MCV 95 79 - 97 fL   MCH 31.1 26.6 - 33.0 pg   MCHC 32.7 31.5 - 35.7 g/dL   RDW 13.0 12.3 - 15.4 %   Platelets 314 150 - 379 x10E3/uL   Neutrophils 62 %   Lymphs 23 %   Monocytes 10 %   Eos 4 %   Basos 1 %   Neutrophils Absolute 4.8 1.4 - 7.0 x10E3/uL   Lymphocytes Absolute 1.8 0.7 - 3.1  x10E3/uL   Monocytes Absolute 0.8 0.1 - 0.9 x10E3/uL   EOS (ABSOLUTE) 0.3 0.0 - 0.4 x10E3/uL   Basophils Absolute 0.1 0.0 - 0.2 x10E3/uL   Immature Granulocytes 0 %   Immature Grans (Abs) 0.0 0.0 - 0.1 x10E3/uL  TSH  Result Value Ref Range   TSH 1.550 0.450 - 4.500 uIU/mL  Urinalysis, Routine w reflex microscopic (not at [facility])  Result Value Ref Range   Specific Gravity, UA <1.005 (L) 1.005 - 1.030   pH, UA 5.0 5.0 - 7.5   Color, UA Yellow Yellow   Appearance Ur Clear Clear   Leukocytes, UA Trace (A) Negative   Protein, UA Negative Negative/Trace   Glucose, UA Negative Negative   Ketones, UA Negative Negative   RBC, UA Negative Negative   Bilirubin, UA Negative Negative   Urobilinogen, Ur 0.2 0.2 - 1.0 mg/dL   Nitrite, UA Negative Negative   Microscopic Examination See below:       Assessment & Plan:   Problem List Items Addressed This Visit      Cardiovascular and Mediastinum   Essential hypertension    The current medical regimen is effective;  continue present plan and medications.         Respiratory   COPD (chronic obstructive pulmonary disease) (HCC)    Followed by pulmonary         Musculoskeletal and Integument   Osteoarthritis of both hands    Stable       Other Visit Diagnoses    Essential hypertension, benign    -  Primary    Relevant Orders    Basic metabolic panel    EKG [id number] (Completed)      reviewed EKG and normal  Follow up plan: Return in about 6 months (around 10/14/2015) for BMP, med check, lipids, alt, ast.

## 2015-04-16 NOTE — Assessment & Plan Note (Signed)
Followed by pulmonary 

## 2015-04-16 NOTE — Assessment & Plan Note (Signed)
The current medical regimen is effective;  continue present plan and medications.  

## 2015-04-17 ENCOUNTER — Encounter: Payer: Self-pay | Admitting: Family Medicine

## 2015-04-17 LAB — BASIC METABOLIC PANEL
BUN/Creatinine Ratio: 17 (ref 11–26)
BUN: 14 mg/dL (ref 8–27)
CO2: 26 mmol/L (ref 18–29)
CREATININE: 0.83 mg/dL (ref 0.57–1.00)
Calcium: 9.7 mg/dL (ref 8.7–10.3)
Chloride: 99 mmol/L (ref 96–106)
GFR, EST AFRICAN AMERICAN: 84 mL/min/{1.73_m2} (ref >59)
GFR, EST NON AFRICAN AMERICAN: 73 mL/min/{1.73_m2} (ref >59)
Glucose: 88 mg/dL (ref 65–99)
Potassium: 4.6 mmol/L (ref 3.5–5.2)
SODIUM: 142 mmol/L (ref 134–144)

## 2015-05-09 DIAGNOSIS — J449 Chronic obstructive pulmonary disease, unspecified: Secondary | ICD-10-CM | POA: Diagnosis not present

## 2015-05-09 DIAGNOSIS — J9611 Chronic respiratory failure with hypoxia: Secondary | ICD-10-CM | POA: Diagnosis not present

## 2015-05-20 DIAGNOSIS — H35342 Macular cyst, hole, or pseudohole, left eye: Secondary | ICD-10-CM | POA: Diagnosis not present

## 2015-05-20 DIAGNOSIS — H353132 Nonexudative age-related macular degeneration, bilateral, intermediate dry stage: Secondary | ICD-10-CM | POA: Diagnosis not present

## 2015-05-20 DIAGNOSIS — H35372 Puckering of macula, left eye: Secondary | ICD-10-CM | POA: Diagnosis not present

## 2015-05-20 DIAGNOSIS — H43813 Vitreous degeneration, bilateral: Secondary | ICD-10-CM | POA: Diagnosis not present

## 2015-06-12 ENCOUNTER — Other Ambulatory Visit: Payer: Self-pay | Admitting: Family Medicine

## 2015-07-01 DIAGNOSIS — J441 Chronic obstructive pulmonary disease with (acute) exacerbation: Secondary | ICD-10-CM | POA: Diagnosis not present

## 2015-07-08 ENCOUNTER — Encounter: Payer: Self-pay | Admitting: Family Medicine

## 2015-08-01 DIAGNOSIS — J441 Chronic obstructive pulmonary disease with (acute) exacerbation: Secondary | ICD-10-CM | POA: Diagnosis not present

## 2015-08-14 DIAGNOSIS — J440 Chronic obstructive pulmonary disease with acute lower respiratory infection: Secondary | ICD-10-CM | POA: Diagnosis not present

## 2015-08-14 DIAGNOSIS — J9611 Chronic respiratory failure with hypoxia: Secondary | ICD-10-CM | POA: Diagnosis not present

## 2015-08-14 DIAGNOSIS — J301 Allergic rhinitis due to pollen: Secondary | ICD-10-CM | POA: Diagnosis not present

## 2015-08-31 DIAGNOSIS — J441 Chronic obstructive pulmonary disease with (acute) exacerbation: Secondary | ICD-10-CM | POA: Diagnosis not present

## 2015-09-03 ENCOUNTER — Ambulatory Visit: Payer: Self-pay

## 2015-09-17 ENCOUNTER — Encounter: Payer: PPO | Attending: Internal Medicine

## 2015-09-17 VITALS — Ht 63.0 in | Wt 142.0 lb

## 2015-09-17 DIAGNOSIS — J449 Chronic obstructive pulmonary disease, unspecified: Secondary | ICD-10-CM | POA: Insufficient documentation

## 2015-09-17 NOTE — Progress Notes (Signed)
Pulmonary Individual Treatment Plan  Patient Details  Name: Joyce Rodriguez MRN: [id number] Date of Birth: 1946/01/18 Referring Provider: Dr Clayborn Bigness       Pulmonary Rehab from 09/17/2015 in [facility] Cardiac and Pulmonary Rehab   Referring Provider  Clayborn Bigness      Initial Encounter Date:       Pulmonary Rehab from 09/17/2015 in [facility] Cardiac and Pulmonary Rehab   Date  09/17/15   Referring Provider  Clayborn Bigness      Visit Diagnosis: COPD, moderate ([place])  Patient's Home Medications on Admission:  Current outpatient prescriptions:    albuterol (PROVENTIL HFA;VENTOLIN HFA) 108 (90 BASE) MCG/ACT inhaler, Inhale 2 puffs into the lungs every 6 (six) hours as needed for shortness of breath., Disp: , Rfl:    aspirin EC 81 MG tablet, Take 81 mg by mouth daily., Disp: , Rfl:    escitalopram (LEXAPRO) 20 MG tablet, Take 1 tablet (20 mg total) by mouth daily., Disp: 90 tablet, Rfl: 4   Fluticasone-Salmeterol (ADVAIR) 250-50 MCG/DOSE AEPB, Inhale 1 puff into the lungs 2 (two) times daily., Disp: , Rfl:    losartan (COZAAR) 100 MG tablet, Take 1 tablet (100 mg total) by mouth daily., Disp: 90 tablet, Rfl: 4   pantoprazole (PROTONIX) 40 MG tablet, Take 1 tablet (40 mg total) by mouth daily., Disp: 90 tablet, Rfl: 4   raloxifene (EVISTA) 60 MG tablet, TAKE 1 TABLET DAILY, Disp: 90 tablet, Rfl: 3   rosuvastatin (CRESTOR) 20 MG tablet, Take 1 tablet (20 mg total) by mouth daily., Disp: 90 tablet, Rfl: 4   tiotropium (SPIRIVA) 18 MCG inhalation capsule, Place 18 mcg into inhaler and inhale daily., Disp: , Rfl:    traMADol (ULTRAM) 50 MG tablet, 1-2 tabs q6 hours as needed for pain, Disp: 100 tablet, Rfl: 1  Past Medical History: Past Medical History  Diagnosis Date   Hyperlipidemia    Osteoporosis    COPD (chronic obstructive pulmonary disease) (HCC)     Tobacco Use: History  Smoking status   Former Smoker   Types: Cigarettes   Quit date: 03/18/2001  Smokeless tobacco   Never  Used    Labs: Recent Review Flowsheet Data    Labs for ITP Cardiac and Pulmonary Rehab Latest Ref Rng 03/13/2014 09/12/2014 03/19/2015   Cholestrol 100 - 199 mg/dL 269(A) 168 179   LDLCALC 0 - 99 mg/dL 151 67 76   HDL >39 mg/dL 89(A) 84 84   Trlycerides 0 - 149 mg/dL 144 86 96       ADL UCSD:     Pulmonary Assessment Scores      09/17/15 0900       ADL UCSD   ADL Phase Entry     SOB Score total 27     Rest 0     Walk 2     Stairs 3     Bath 2     Dress 1     Shop 2        Pulmonary Function Assessment:     Pulmonary Function Assessment - 09/17/15 0900    Pulmonary Function Tests   RV% 167 %   DLCO% 28 %   Initial Spirometry Results   FVC% 61 %   FEV1% 36 %   FEV1/FVC Ratio 46   Post Bronchodilator Spirometry Results   FVC% 68 %   FEV1% 37 %   FEV1/FVC Ratio 43   Breath   Bilateral Breath Sounds Decreased;Clear   Shortness of Breath  Yes;Fear of Shortness of Breath;Limiting activity      Exercise Target Goals: Date: 09/17/15  Exercise Program Goal: Individual exercise prescription set with THRR, safety & activity barriers. Participant demonstrates ability to understand and report RPE using BORG scale, to self-measure pulse accurately, and to acknowledge the importance of the exercise prescription.  Exercise Prescription Goal: Starting with aerobic activity 30 plus minutes a day, 3 days per week for initial exercise prescription. Provide home exercise prescription and guidelines that participant acknowledges understanding prior to discharge.  Activity Barriers & Risk Stratification:     Activity Barriers & Cardiac Risk Stratification - 09/17/15 0900    Activity Barriers & Cardiac Risk Stratification   Activity Barriers Shortness of Breath;Deconditioning;Muscular Weakness   Cardiac Risk Stratification Moderate      6 Minute Walk:     6 Minute Walk      09/17/15 1536       6 Minute Walk   Distance 858 feet     Walk Time 6 minutes     # of Rest  Breaks 0     MPH 1.63     METS 2.48     RPE 13     VO2 Peak 8.7     Symptoms No     Resting HR 100 bpm     Resting BP 118/66 mmHg     Max Ex. HR 120 bpm     Max Ex. BP 132/68 mmHg     Interval HR   Baseline HR 100     1 Minute HR 110     2 Minute HR 114     3 Minute HR 116     4 Minute HR 118     5 Minute HR 120     6 Minute HR 118     2 Minute Post HR 106     Interval Heart Rate? Yes     Interval Oxygen   Interval Oxygen? Yes     Baseline Oxygen Saturation % 92 %     Baseline Liters of Oxygen 1 L     1 Minute Oxygen Saturation % 95 %     1 Minute Liters of Oxygen 1 L     2 Minute Oxygen Saturation % 93 %     2 Minute Liters of Oxygen 1 L     3 Minute Oxygen Saturation % 91 %     3 Minute Liters of Oxygen 1 L     4 Minute Oxygen Saturation % 88 %     4 Minute Liters of Oxygen 1 L     5 Minute Oxygen Saturation % 88 %     5 Minute Liters of Oxygen 1 L     6 Minute Oxygen Saturation % 88 %     6 Minute Liters of Oxygen 1 L     2 Minute Post Oxygen Saturation % 94 %     2 Minute Post Liters of Oxygen 1 L        Initial Exercise Prescription:     Initial Exercise Prescription - 09/17/15 1500    Date of Initial Exercise RX and Referring Provider   Date 09/17/15   Referring Provider Clayborn Bigness   Oxygen   Oxygen Continuous   Liters 1   Treadmill   MPH 1.5   Grade 0   Minutes 15   METs 2.4   NuStep   Level 3   Watts 30   Minutes 15  METs 2.4   Recumbant Elliptical   Level 1   RPM 50   Watts 30   Minutes 15   METs 2.4   Biostep-RELP   Level 3   Watts 30   Minutes 15   METs 2.4   Prescription Details   Frequency (times per week) 3-5   Duration Progress to 45 minutes of aerobic exercise without signs/symptoms of physical distress   Intensity   THRR 40-80% of Max Heartrate 120-141   Ratings of Perceived Exertion 11-15   Perceived Dyspnea 0-4   Progression   Progression Continue to progress workloads to maintain intensity without signs/symptoms of  physical distress.   Resistance Training   Training Prescription Yes   Weight 2   Reps 10-12      Perform Capillary Blood Glucose checks as needed.  Exercise Prescription Changes:   Exercise Comments:     Exercise Comments      09/17/15 1605           Exercise Comments Pt did not report any musculoskeletal issues - expected to progress well with exercise.  Noticed that she felt better during 6 min walk with 1 L contiuous oxygen.          Discharge Exercise Prescription (Final Exercise Prescription Changes):    Nutrition:  Target Goals: Understanding of nutrition guidelines, daily intake of sodium 1500mg , cholesterol 200mg , calories 30% from fat and 7% or less from saturated fats, daily to have 5 or more servings of fruits and vegetables.  Biometrics:     Pre Biometrics - 09/17/15 1608    Pre Biometrics   Height 5\' 3"  (1.6 m)   Weight 142 lb (64.411 kg)   Waist Circumference 36 inches   Hip Circumference 41.25 inches   Waist to Hip Ratio 0.87 %   BMI (Calculated) 25.2       Nutrition Therapy Plan and Nutrition Goals:     Nutrition Therapy & Goals - 09/17/15 0900    Nutrition Therapy   Diet Prefers not to see the dietitian; Husband cooks healthy meals with vegetables; she does not use salt, but does like sweets; her gopal is to loss 5lbs.      Nutrition Discharge: Rate Your Plate Scores:   Psychosocial: Target Goals: Acknowledge presence or absence of depression, maximize coping skills, provide positive support system. Participant is able to verbalize types and ability to use techniques and skills needed for reducing stress and depression.  Initial Review & Psychosocial Screening:     Initial Psych Review & Screening - 09/17/15 0900    Family Dynamics   Good Support System? Yes   Comments Ms Akard has good support from her children and husband. She states she has no depression, although she does miss walking distances without shortness of breath  and is looking forward to partivccipation in [facility].   Barriers   Psychosocial barriers to participate in program The patient should benefit from training in stress management and relaxation.;There are no identifiable barriers or psychosocial needs.   Screening Interventions   Interventions Encouraged to exercise      Quality of Life Scores:     Quality of Life - 09/17/15 0900    Quality of Life Scores   Health/Function Pre 20.19 %   Socioeconomic Pre 26 %   Psych/Spiritual Pre 24.86 %   Family Pre 27.6 %   GLOBAL Pre 23.26 %      PHQ-9:     Recent Review Flowsheet Data  Depression screen [facility] 2/9 09/17/2015 09/12/2014   Decreased Interest 0 0   Down, Depressed, Hopeless 0 0   PHQ - 2 Score 0 0   Altered sleeping 0 -   Tired, decreased energy 1 -   Change in appetite 0 -   Feeling bad or failure about yourself  0 -   Trouble concentrating 0 -   Moving slowly or fidgety/restless 0 -   Suicidal thoughts 0 -   PHQ-9 Score 1 -   Difficult doing work/chores Somewhat difficult -      Psychosocial Evaluation and Intervention:   Psychosocial Re-Evaluation:  Education: Education Goals: Education classes will be provided on a weekly basis, covering required topics. Participant will state understanding/return demonstration of topics presented.  Learning Barriers/Preferences:     Learning Barriers/Preferences - 09/17/15 0900    Learning Barriers/Preferences   Learning Barriers None   Learning Preferences Group Instruction;Individual Instruction;Pictoral;Skilled Demonstration;Verbal Instruction;Video;Written Material      Education Topics: Initial Evaluation Education: - Verbal, written and demonstration of respiratory meds, RPE/PD scales, oximetry and breathing techniques. Instruction on use of nebulizers and MDIs: cleaning and proper use, rinsing mouth with steroid doses and importance of monitoring MDI activations.          Pulmonary Rehab from 09/17/2015 in [facility]  Cardiac and Pulmonary Rehab   Date  09/17/15   Educator  LB   Instruction Review Code  2- meets goals/outcomes      General Nutrition Guidelines/Fats and Fiber: -Group instruction provided by verbal, written material, models and posters to present the general guidelines for heart healthy nutrition. Gives an explanation and review of dietary fats and fiber.   Controlling Sodium/Reading Food Labels: -Group verbal and written material supporting the discussion of sodium use in heart healthy nutrition. Review and explanation with models, verbal and written materials for utilization of the food label.   Exercise Physiology & Risk Factors: - Group verbal and written instruction with models to review the exercise physiology of the cardiovascular system and associated critical values. Details cardiovascular disease risk factors and the goals associated with each risk factor.   Aerobic Exercise & Resistance Training: - Gives group verbal and written discussion on the health impact of inactivity. On the components of aerobic and resistive training programs and the benefits of this training and how to safely progress through these programs.   Flexibility, Balance, General Exercise Guidelines: - Provides group verbal and written instruction on the benefits of flexibility and balance training programs. Provides general exercise guidelines with specific guidelines to those with heart or lung disease. Demonstration and skill practice provided.   Stress Management: - Provides group verbal and written instruction about the health risks of elevated stress, cause of high stress, and healthy ways to reduce stress.   Depression: - Provides group verbal and written instruction on the correlation between heart/lung disease and depressed mood, treatment options, and the stigmas associated with seeking treatment.   Exercise & Equipment Safety: - Individual verbal instruction and demonstration of equipment  use and safety with use of the equipment.      Pulmonary Rehab from 09/17/2015 in [facility] Cardiac and Pulmonary Rehab   Date  09/17/15   Educator  LB   Instruction Review Code  2- meets goals/outcomes      Infection Prevention: - Provides verbal and written material to individual with discussion of infection control including proper hand washing and proper equipment cleaning during exercise session.      Pulmonary Rehab from 09/17/2015  in [facility] Cardiac and Pulmonary Rehab   Date  09/17/15   Educator  LB   Instruction Review Code  2- meets goals/outcomes      Falls Prevention: - Provides verbal and written material to individual with discussion of falls prevention and safety.      Pulmonary Rehab from 09/17/2015 in [facility] Cardiac and Pulmonary Rehab   Date  09/17/15   Educator  LB   Instruction Review Code  2- meets goals/outcomes      Diabetes: - Individual verbal and written instruction to review signs/symptoms of diabetes, desired ranges of glucose level fasting, after meals and with exercise. Advice that pre and post exercise glucose checks will be done for 3 sessions at entry of program.   Chronic Lung Diseases: - Group verbal and written instruction to review new updates, new respiratory medications, new advancements in procedures and treatments. Provide informative websites and "800" numbers of self-education.   Lung Procedures: - Group verbal and written instruction to describe testing methods done to diagnose lung disease. Review the outcome of test results. Describe the treatment choices: Pulmonary Function Tests, ABGs and oximetry.   Energy Conservation: - Provide group verbal and written instruction for methods to conserve energy, plan and organize activities. Instruct on pacing techniques, use of adaptive equipment and posture/positioning to relieve shortness of breath.   Triggers: - Group verbal and written instruction to review types of environmental controls: home  humidity, furnaces, filters, dust mite/pet prevention, HEPA vacuums. To discuss weather changes, air quality and the benefits of nasal washing.   Exacerbations: - Group verbal and written instruction to provide: warning signs, infection symptoms, calling MD promptly, preventive modes, and value of vaccinations. Review: effective airway clearance, coughing and/or vibration techniques. Create an Sports administrator.   Oxygen: - Individual and group verbal and written instruction on oxygen therapy. Includes supplement oxygen, available portable oxygen systems, continuous and intermittent flow rates, oxygen safety, concentrators, and Medicare reimbursement for oxygen.      Pulmonary Rehab from 09/17/2015 in [facility] Cardiac and Pulmonary Rehab   Date  09/17/15   Educator  LB   Instruction Review Code  2- meets goals/outcomes      Respiratory Medications: - Group verbal and written instruction to review medications for lung disease. Drug class, frequency, complications, importance of spacers, rinsing mouth after steroid MDI's, and proper cleaning methods for nebulizers.      Pulmonary Rehab from 09/17/2015 in [facility] Cardiac and Pulmonary Rehab   Date  09/17/15   Educator  LB   Instruction Review Code  2- meets goals/outcomes      AED/CPR: - Group verbal and written instruction with the use of models to demonstrate the basic use of the AED with the basic ABC's of resuscitation.   Breathing Retraining: - Provides individuals verbal and written instruction on purpose, frequency, and proper technique of diaphragmatic breathing and pursed-lipped breathing. Applies individual practice skills.      Pulmonary Rehab from 09/17/2015 in [facility] Cardiac and Pulmonary Rehab   Date  09/17/15   Educator  LB   Instruction Review Code  2- meets goals/outcomes      Anatomy and Physiology of the Lungs: - Group verbal and written instruction with the use of models to provide basic lung anatomy and physiology related to  function, structure and complications of lung disease.   Heart Failure: - Group verbal and written instruction on the basics of heart failure: signs/symptoms, treatments, explanation of ejection fraction, enlarged heart and cardiomyopathy.  Sleep Apnea: - Individual verbal and written instruction to review Obstructive Sleep Apnea. Review of risk factors, methods for diagnosing and types of masks and machines for OSA.   Anxiety: - Provides group, verbal and written instruction on the correlation between heart/lung disease and anxiety, treatment options, and management of anxiety.   Relaxation: - Provides group, verbal and written instruction about the benefits of relaxation for patients with heart/lung disease. Also provides patients with examples of relaxation techniques.   Knowledge Questionnaire Score:     Knowledge Questionnaire Score - 09/17/15 0900    Knowledge Questionnaire Score   Pre Score 7/10       Core Components/Risk Factors/Patient Goals at Admission:     Personal Goals and Risk Factors at Admission - 09/17/15 0900    Core Components/Risk Factors/Patient Goals on Admission   Sedentary Yes  Ms Timko's goal is to increase her walking. she may plan to walk in their pool this summer.   Intervention Provide advice, education, support and counseling about physical activity/exercise needs.;Develop an individualized exercise prescription for aerobic and resistive training based on initial evaluation findings, risk stratification, comorbidities and participant's personal goals.   Expected Outcomes Achievement of increased cardiorespiratory fitness and enhanced flexibility, muscular endurance and strength shown through measurements of functional capacity and personal statement of participant.   Increase Strength and Stamina Yes   Intervention Provide advice, education, support and counseling about physical activity/exercise needs.;Develop an individualized exercise  prescription for aerobic and resistive training based on initial evaluation findings, risk stratification, comorbidities and participant's personal goals.   Expected Outcomes Achievement of increased cardiorespiratory fitness and enhanced flexibility, muscular endurance and strength shown through measurements of functional capacity and personal statement of participant.   Improve shortness of breath with ADL's Yes   Intervention Provide education, individualized exercise plan and daily activity instruction to help decrease symptoms of SOB with activities of daily living.   Expected Outcomes Short Term: Achieves a reduction of symptoms when performing activities of daily living.   Develop more efficient breathing techniques such as purse lipped breathing and diaphragmatic breathing; and practicing self-pacing with activity Yes   Intervention Provide education, demonstration and support about specific breathing techniuqes utilized for more efficient breathing. Include techniques such as pursed lipped breathing, diaphragmatic breathing and self-pacing activity.   Expected Outcomes Short Term: Participant will be able to demonstrate and use breathing techniques as needed throughout daily activities.   Increase knowledge of respiratory medications and ability to use respiratory devices properly  Yes  Uses oxygen for sleep and activity - 1l/m; she needs to be encouraged to use with activity on continuous flow; uses Advair, Spiriva, Albuterol MDIs and SVN with Albuterol.   Intervention Provide education and demonstration as needed of appropriate use of medications, inhalers, and oxygen therapy.   Expected Outcomes Short Term: Achieves understanding of medications use. Understands that oxygen is a medication prescribed by physician. Demonstrates appropriate use of inhaler and oxygen therapy.   Hypertension Yes   Intervention Provide education on lifestyle modifcations including regular physical  activity/exercise, weight management, moderate sodium restriction and increased consumption of fresh fruit, vegetables, and low fat dairy, alcohol moderation, and smoking cessation.;Monitor prescription use compliance.   Expected Outcomes Short Term: Continued assessment and intervention until BP is < 140/39mm HG in hypertensive participants. < 130/38mm HG in hypertensive participants with diabetes, heart failure or chronic kidney disease.;Long Term: Maintenance of blood pressure at goal levels.      Core Components/Risk Factors/Patient Goals Review:  Core Components/Risk Factors/Patient Goals at Discharge (Final Review):    ITP Comments:   Comments: Ms Winkelmann will start [facility] on 09/23/2015 and will attend 2 days/week.

## 2015-09-17 NOTE — Patient Instructions (Signed)
Patient Instructions  Patient Details  Name: Joyce Rodriguez MRN: [id number] Date of Birth: 1946-02-06 Referring Provider:  Lavera Guise, MD  Below are the personal goals you chose as well as exercise and nutrition goals. Our goal is to help you keep on track towards obtaining and maintaining your goals. We will be discussing your progress on these goals with you throughout the program.  Initial Exercise Prescription:     Initial Exercise Prescription - 09/17/15 1500    Date of Initial Exercise RX and Referring Provider   Date 09/17/15   Referring Provider Clayborn Bigness   Oxygen   Oxygen Continuous   Liters 1   Treadmill   MPH 1.5   Grade 0   Minutes 15   METs 2.4   NuStep   Level 3   Watts 30   Minutes 15   METs 2.4   Recumbant Elliptical   Level 1   RPM 50   Watts 30   Minutes 15   METs 2.4   Biostep-RELP   Level 3   Watts 30   Minutes 15   METs 2.4   Prescription Details   Frequency (times per week) 3-5   Duration Progress to 45 minutes of aerobic exercise without signs/symptoms of physical distress   Intensity   THRR 40-80% of Max Heartrate 120-141   Ratings of Perceived Exertion 11-15   Perceived Dyspnea 0-4   Progression   Progression Continue to progress workloads to maintain intensity without signs/symptoms of physical distress.   Resistance Training   Training Prescription Yes   Weight 2   Reps 10-12      Exercise Goals: Frequency: Be able to perform aerobic exercise three times per week working toward 3-5 days per week.  Intensity: Work with a perceived exertion of 11 (fairly light) - 15 (hard) as tolerated. Follow your new exercise prescription and watch for changes in prescription as you progress with the program. Changes will be reviewed with you when they are made.  Duration: You should be able to do 30 minutes of continuous aerobic exercise in addition to a 5 minute warm-up and a 5 minute cool-down routine.  Nutrition Goals: Your personal  nutrition goals will be established when you do your nutrition analysis with the dietician.  The following are nutrition guidelines to follow: Cholesterol < 200mg /day Sodium < 1500mg /day Fiber: Women over 50 yrs - 21 grams per day  Personal Goals:     Personal Goals and Risk Factors at Admission - 09/17/15 0900    Core Components/Risk Factors/Patient Goals on Admission   Sedentary Yes  [name]'s goal is to increase her walking. she may plan to walk in their pool this summer.   Intervention Provide advice, education, support and counseling about physical activity/exercise needs.;Develop an individualized exercise prescription for aerobic and resistive training based on initial evaluation findings, risk stratification, comorbidities and participant's personal goals.   Expected Outcomes Achievement of increased cardiorespiratory fitness and enhanced flexibility, muscular endurance and strength shown through measurements of functional capacity and personal statement of participant.   Increase Strength and Stamina Yes   Intervention Provide advice, education, support and counseling about physical activity/exercise needs.;Develop an individualized exercise prescription for aerobic and resistive training based on initial evaluation findings, risk stratification, comorbidities and participant's personal goals.   Expected Outcomes Achievement of increased cardiorespiratory fitness and enhanced flexibility, muscular endurance and strength shown through measurements of functional capacity and personal statement of participant.   Improve shortness of breath with  ADL's Yes   Intervention Provide education, individualized exercise plan and daily activity instruction to help decrease symptoms of SOB with activities of daily living.   Expected Outcomes Short Term: Achieves a reduction of symptoms when performing activities of daily living.   Develop more efficient breathing techniques such as purse lipped  breathing and diaphragmatic breathing; and practicing self-pacing with activity Yes   Intervention Provide education, demonstration and support about specific breathing techniuqes utilized for more efficient breathing. Include techniques such as pursed lipped breathing, diaphragmatic breathing and self-pacing activity.   Expected Outcomes Short Term: Participant will be able to demonstrate and use breathing techniques as needed throughout daily activities.   Increase knowledge of respiratory medications and ability to use respiratory devices properly  Yes  Uses oxygen for sleep and activity - 1l/m; she needs to be encouraged to use with activity on continuous flow; uses Advair, Spiriva, Albuterol MDIs and SVN with Albuterol.   Intervention Provide education and demonstration as needed of appropriate use of medications, inhalers, and oxygen therapy.   Expected Outcomes Short Term: Achieves understanding of medications use. Understands that oxygen is a medication prescribed by physician. Demonstrates appropriate use of inhaler and oxygen therapy.   Hypertension Yes   Intervention Provide education on lifestyle modifcations including regular physical activity/exercise, weight management, moderate sodium restriction and increased consumption of fresh fruit, vegetables, and low fat dairy, alcohol moderation, and smoking cessation.;Monitor prescription use compliance.   Expected Outcomes Short Term: Continued assessment and intervention until BP is < 140/36mm HG in hypertensive participants. < 130/13mm HG in hypertensive participants with diabetes, heart failure or chronic kidney disease.;Long Term: Maintenance of blood pressure at goal levels.      Tobacco Use Initial Evaluation: History  Smoking status   Former Smoker   Types: Cigarettes   Quit date: 03/18/2001  Smokeless tobacco   Never Used    Copy of goals given to participant.

## 2015-09-23 ENCOUNTER — Encounter: Payer: PPO | Admitting: *Deleted

## 2015-09-23 ENCOUNTER — Encounter: Payer: Self-pay | Admitting: *Deleted

## 2015-09-23 ENCOUNTER — Telehealth: Payer: Self-pay | Admitting: Family Medicine

## 2015-09-23 DIAGNOSIS — J449 Chronic obstructive pulmonary disease, unspecified: Secondary | ICD-10-CM

## 2015-09-23 DIAGNOSIS — E785 Hyperlipidemia, unspecified: Secondary | ICD-10-CM

## 2015-09-23 DIAGNOSIS — M19041 Primary osteoarthritis, right hand: Secondary | ICD-10-CM

## 2015-09-23 DIAGNOSIS — K219 Gastro-esophageal reflux disease without esophagitis: Secondary | ICD-10-CM

## 2015-09-23 DIAGNOSIS — M19042 Primary osteoarthritis, left hand: Principal | ICD-10-CM

## 2015-09-23 MED ORDER — TRAMADOL HCL 50 MG PO TABS: ORAL_TABLET | ORAL | Status: DC

## 2015-09-23 MED ORDER — ROSUVASTATIN CALCIUM 20 MG PO TABS: 20.0000 mg | ORAL_TABLET | Freq: Every day | ORAL | Status: DC

## 2015-09-23 MED ORDER — PANTOPRAZOLE SODIUM 40 MG PO TBEC: 40.0000 mg | DELAYED_RELEASE_TABLET | Freq: Every day | ORAL | Status: DC

## 2015-09-23 NOTE — Telephone Encounter (Signed)
Rx's written and sent in. Tramadol needs to be picked up. Thanks.

## 2015-09-23 NOTE — Telephone Encounter (Signed)
[facility] called stated pt wants her medications sent  to [facility], they need a new RX sent to them for the following medications:  Crestor Protonix Tramadol  Please call with any issues. Thanks. Pt told pharmacy she needs these because she is going out of town.

## 2015-09-23 NOTE — Progress Notes (Unsigned)
Pulmonary Individual Treatment Plan  Patient Details  Name: Joyce Rodriguez MRN: [id number] Date of Birth: May 28, 1945 Referring Provider:        Pulmonary Rehab from 09/17/2015 in [facility] Cardiac and Pulmonary Rehab   Referring Provider  Clayborn Bigness      Initial Encounter Date:       Pulmonary Rehab from 09/17/2015 in [facility] Cardiac and Pulmonary Rehab   Date  09/17/15   Referring Provider  Clayborn Bigness      Visit Diagnosis: COPD, moderate ([facility])  Patient's Home Medications on Admission:  Current outpatient prescriptions:  .  albuterol (PROVENTIL HFA;VENTOLIN HFA) 108 (90 BASE) MCG/ACT inhaler, Inhale 2 puffs into the lungs every 6 (six) hours as needed for shortness of breath., Disp: , Rfl:  .  aspirin EC 81 MG tablet, Take 81 mg by mouth daily., Disp: , Rfl:  .  escitalopram (LEXAPRO) 20 MG tablet, Take 1 tablet (20 mg total) by mouth daily., Disp: 90 tablet, Rfl: 4 .  Fluticasone-Salmeterol (ADVAIR) 250-50 MCG/DOSE AEPB, Inhale 1 puff into the lungs 2 (two) times daily., Disp: , Rfl:  .  losartan (COZAAR) 100 MG tablet, Take 1 tablet (100 mg total) by mouth daily., Disp: 90 tablet, Rfl: 4 .  pantoprazole (PROTONIX) 40 MG tablet, Take 1 tablet (40 mg total) by mouth daily., Disp: 90 tablet, Rfl: 4 .  raloxifene (EVISTA) 60 MG tablet, TAKE 1 TABLET DAILY, Disp: 90 tablet, Rfl: 3 .  rosuvastatin (CRESTOR) 20 MG tablet, Take 1 tablet (20 mg total) by mouth daily., Disp: 90 tablet, Rfl: 4 .  tiotropium (SPIRIVA) 18 MCG inhalation capsule, Place 18 mcg into inhaler and inhale daily., Disp: , Rfl:  .  traMADol (ULTRAM) 50 MG tablet, 1-2 tabs q6 hours as needed for pain, Disp: 100 tablet, Rfl: 1  Past Medical History: Past Medical History  Diagnosis Date  . Hyperlipidemia   . Osteoporosis   . COPD (chronic obstructive pulmonary disease) (HCC)     Tobacco Use: History  Smoking status  . Former Smoker  . Types: Cigarettes  . Quit date: 03/18/2001  Smokeless tobacco  . Never Used     Labs: Recent Review Flowsheet Data    Labs for ITP Cardiac and Pulmonary Rehab Latest Ref Rng 03/13/2014 09/12/2014 03/19/2015   Cholestrol 100 - 199 mg/dL 269(A) 168 179   LDLCALC 0 - 99 mg/dL 151 67 76   HDL >39 mg/dL 89(A) 84 84   Trlycerides 0 - 149 mg/dL 144 86 96       ADL [facility]SD:     Pulmonary Assessment Scores      09/17/15 0900       ADL UCSD   ADL Phase Entry     SOB Score total 27     Rest 0     Walk 2     Stairs 3     Bath 2     Dress 1     Shop 2        Pulmonary Function Assessment:     Pulmonary Function Assessment - 09/17/15 0900    Pulmonary Function Tests   RV% 167 %   DLCO% 28 %   Initial Spirometry Results   FVC% 61 %   FEV1% 36 %   FEV1/FVC Ratio 46   Post Bronchodilator Spirometry Results   FVC% 68 %   FEV1% 37 %   FEV1/FVC Ratio 43   Breath   Bilateral Breath Sounds Decreased;Clear   Shortness of Breath Yes;Fear of  Shortness of Breath;Limiting activity      Exercise Target Goals:    Exercise Program Goal: Individual exercise prescription set with THRR, safety & activity barriers. Participant demonstrates ability to understand and report RPE using BORG scale, to self-measure pulse accurately, and to acknowledge the importance of the exercise prescription.  Exercise Prescription Goal: Starting with aerobic activity 30 plus minutes a day, 3 days per week for initial exercise prescription. Provide home exercise prescription and guidelines that participant acknowledges understanding prior to discharge.  Activity Barriers & Risk Stratification:     Activity Barriers & Cardiac Risk Stratification - 09/17/15 0900    Activity Barriers & Cardiac Risk Stratification   Activity Barriers Shortness of Breath;Deconditioning;Muscular Weakness   Cardiac Risk Stratification Moderate      6 Minute Walk:     6 Minute Walk      09/17/15 1536       6 Minute Walk   Distance 858 feet     Walk Time 6 minutes     # of Rest Breaks 0     MPH  1.63     METS 2.48     RPE 13     VO2 Peak 8.7     Symptoms No     Resting HR 100 bpm     Resting BP 118/66 mmHg     Max Ex. HR 120 bpm     Max Ex. BP 132/68 mmHg     Interval HR   Baseline HR 100     1 Minute HR 110     2 Minute HR 114     3 Minute HR 116     4 Minute HR 118     5 Minute HR 120     6 Minute HR 118     2 Minute Post HR 106     Interval Heart Rate? Yes     Interval Oxygen   Interval Oxygen? Yes     Baseline Oxygen Saturation % 92 %     Baseline Liters of Oxygen 1 L     1 Minute Oxygen Saturation % 95 %     1 Minute Liters of Oxygen 1 L     2 Minute Oxygen Saturation % 93 %     2 Minute Liters of Oxygen 1 L     3 Minute Oxygen Saturation % 91 %     3 Minute Liters of Oxygen 1 L     4 Minute Oxygen Saturation % 88 %     4 Minute Liters of Oxygen 1 L     5 Minute Oxygen Saturation % 88 %     5 Minute Liters of Oxygen 1 L     6 Minute Oxygen Saturation % 88 %     6 Minute Liters of Oxygen 1 L     2 Minute Post Oxygen Saturation % 94 %     2 Minute Post Liters of Oxygen 1 L        Initial Exercise Prescription:     Initial Exercise Prescription - 09/17/15 1500    Date of Initial Exercise RX and Referring Provider   Date 09/17/15   Referring Provider Clayborn Bigness   Oxygen   Oxygen Continuous   Liters 1   Treadmill   MPH 1.5   Grade 0   Minutes 15   METs 2.4   NuStep   Level 3   Watts 30   Minutes 15   METs 2.4  Recumbant Elliptical   Level 1   RPM 50   Watts 30   Minutes 15   METs 2.4   Biostep-RELP   Level 3   Watts 30   Minutes 15   METs 2.4   Prescription Details   Frequency (times per week) 3-5   Duration Progress to 45 minutes of aerobic exercise without signs/symptoms of physical distress   Intensity   THRR 40-80% of Max Heartrate 120-141   Ratings of Perceived Exertion 11-15   Perceived Dyspnea 0-4   Progression   Progression Continue to progress workloads to maintain intensity without signs/symptoms of physical distress.    Resistance Training   Training Prescription Yes   Weight 2   Reps 10-12      Perform Capillary Blood Glucose checks as needed.  Exercise Prescription Changes:     Exercise Prescription Changes      09/23/15 1200           Resistance Training   Training Prescription Yes       Weight 2       Reps 10-12       Oxygen   Oxygen Continuous       Liters 1       Treadmill   MPH 1.2       Grade 0       Minutes 15       METs 2.4       NuStep   Level 3       Watts 25       Minutes 15       METs 2.4       Recumbant Elliptical   Level 1  omitted today due to time       RPM 50       Watts 30       Minutes 15       METs 2.4       Biostep-RELP   Level 3       Watts 20       Minutes 8       METs 2.4          Exercise Comments:     Exercise Comments      09/17/15 1605 09/23/15 1213         Exercise Comments Pt did not report any musculoskeletal issues - expected to progress well with exercise.  Noticed that she felt better during 6 min walk with 1 L contiuous oxygen. First full day of exercise!  Patient was oriented to gym and equipment including functions, settings, policies, and procedures.  Patient's individual exercise prescription and treatment plan were reviewed.  All starting workloads were established based on the results of the 6 minute walk test done at initial orientation visit.  The plan for exercise progression was also introduced and progression will be customized based on patient's performance and goals.         Discharge Exercise Prescription (Final Exercise Prescription Changes):     Exercise Prescription Changes - 09/23/15 1200    Resistance Training   Training Prescription Yes   Weight 2   Reps 10-12   Oxygen   Oxygen Continuous   Liters 1   Treadmill   MPH 1.2   Grade 0   Minutes 15   METs 2.4   NuStep   Level 3   Watts 25   Minutes 15   METs 2.4   Recumbant Elliptical   Level 1  omitted  today due to time   RPM 50   Watts 30    Minutes 15   METs 2.4   Biostep-RELP   Level 3   Watts 20   Minutes 8   METs 2.4       Nutrition:  Target Goals: Understanding of nutrition guidelines, daily intake of sodium 1500mg , cholesterol 200mg , calories 30% from fat and 7% or less from saturated fats, daily to have 5 or more servings of fruits and vegetables.  Biometrics:     Pre Biometrics - 09/17/15 1608    Pre Biometrics   Height 5\' 3"  (1.6 m)   Weight 142 lb (64.411 kg)   Waist Circumference 36 inches   Hip Circumference 41.25 inches   Waist to Hip Ratio 0.87 %   BMI (Calculated) 25.2       Nutrition Therapy Plan and Nutrition Goals:     Nutrition Therapy & Goals - 09/17/15 0900    Nutrition Therapy   Diet Prefers not to see the dietitian; Husband cooks healthy meals with vegetables; she does not use salt, but does like sweets; her gopal is to loss 5lbs.      Nutrition Discharge: Rate Your Plate Scores:   Psychosocial: Target Goals: Acknowledge presence or absence of depression, maximize coping skills, provide positive support system. Participant is able to verbalize types and ability to use techniques and skills needed for reducing stress and depression.  Initial Review & Psychosocial Screening:     Initial Psych Review & Screening - 09/17/15 0900    Family Dynamics   Good Support System? Yes   Comments Ms Lemelle has good support from her children and husband. She states she has no depression, although she does miss walking distances without shortness of breath and is looking forward to partivccipation in [facility].   Barriers   Psychosocial barriers to participate in program The patient should benefit from training in stress management and relaxation.;There are no identifiable barriers or psychosocial needs.   Screening Interventions   Interventions Encouraged to exercise      Quality of Life Scores:     Quality of Life - 09/17/15 0900    Quality of Life Scores   Health/Function Pre  20.19 %   Socioeconomic Pre 26 %   Psych/Spiritual Pre 24.86 %   Family Pre 27.6 %   GLOBAL Pre 23.26 %      PHQ-9:     Recent Review Flowsheet Data    Depression screen [facility] 2/9 09/17/2015 09/12/2014   Decreased Interest 0 0   Down, Depressed, Hopeless 0 0   PHQ - 2 Score 0 0   Altered sleeping 0 -   Tired, decreased energy 1 -   Change in appetite 0 -   Feeling bad or failure about yourself  0 -   Trouble concentrating 0 -   Moving slowly or fidgety/restless 0 -   Suicidal thoughts 0 -   PHQ-9 Score 1 -   Difficult doing work/chores Somewhat difficult -      Psychosocial Evaluation and Intervention:   Psychosocial Re-Evaluation:  Education: Education Goals: Education classes will be provided on a weekly basis, covering required topics. Participant will state understanding/return demonstration of topics presented.  Learning Barriers/Preferences:     Learning Barriers/Preferences - 09/17/15 0900    Learning Barriers/Preferences   Learning Barriers None   Learning Preferences Group Instruction;Individual Instruction;Pictoral;Skilled Demonstration;Verbal Instruction;Video;Written Material      Education Topics: Initial Evaluation Education: - Verbal, written and demonstration of  respiratory meds, RPE/PD scales, oximetry and breathing techniques. Instruction on use of nebulizers and MDIs: cleaning and proper use, rinsing mouth with steroid doses and importance of monitoring MDI activations.          Pulmonary Rehab from 09/17/2015 in [facility] Cardiac and Pulmonary Rehab   Date  09/17/15   Educator  LB   Instruction Review Code  2- meets goals/outcomes      General Nutrition Guidelines/Fats and Fiber: -Group instruction provided by verbal, written material, models and posters to present the general guidelines for heart healthy nutrition. Gives an explanation and review of dietary fats and fiber.   Controlling Sodium/Reading Food Labels: -Group verbal and written  material supporting the discussion of sodium use in heart healthy nutrition. Review and explanation with models, verbal and written materials for utilization of the food label.   Exercise Physiology & Risk Factors: - Group verbal and written instruction with models to review the exercise physiology of the cardiovascular system and associated critical values. Details cardiovascular disease risk factors and the goals associated with each risk factor.   Aerobic Exercise & Resistance Training: - Gives group verbal and written discussion on the health impact of inactivity. On the components of aerobic and resistive training programs and the benefits of this training and how to safely progress through these programs.   Flexibility, Balance, General Exercise Guidelines: - Provides group verbal and written instruction on the benefits of flexibility and balance training programs. Provides general exercise guidelines with specific guidelines to those with heart or lung disease. Demonstration and skill practice provided.   Stress Management: - Provides group verbal and written instruction about the health risks of elevated stress, cause of high stress, and healthy ways to reduce stress.   Depression: - Provides group verbal and written instruction on the correlation between heart/lung disease and depressed mood, treatment options, and the stigmas associated with seeking treatment.   Exercise & Equipment Safety: - Individual verbal instruction and demonstration of equipment use and safety with use of the equipment.      Pulmonary Rehab from 09/17/2015 in [facility] Cardiac and Pulmonary Rehab   Date  09/17/15   Educator  LB   Instruction Review Code  2- meets goals/outcomes      Infection Prevention: - Provides verbal and written material to individual with discussion of infection control including proper hand washing and proper equipment cleaning during exercise session.      Pulmonary Rehab from  09/17/2015 in [facility] Cardiac and Pulmonary Rehab   Date  09/17/15   Educator  LB   Instruction Review Code  2- meets goals/outcomes      Falls Prevention: - Provides verbal and written material to individual with discussion of falls prevention and safety.      Pulmonary Rehab from 09/17/2015 in [facility] Cardiac and Pulmonary Rehab   Date  09/17/15   Educator  LB   Instruction Review Code  2- meets goals/outcomes      Diabetes: - Individual verbal and written instruction to review signs/symptoms of diabetes, desired ranges of glucose level fasting, after meals and with exercise. Advice that pre and post exercise glucose checks will be done for 3 sessions at entry of program.   Chronic Lung Diseases: - Group verbal and written instruction to review new updates, new respiratory medications, new advancements in procedures and treatments. Provide informative websites and "800" numbers of self-education.   Lung Procedures: - Group verbal and written instruction to describe testing methods done to diagnose lung  disease. Review the outcome of test results. Describe the treatment choices: Pulmonary Function Tests, ABGs and oximetry.   Energy Conservation: - Provide group verbal and written instruction for methods to conserve energy, plan and organize activities. Instruct on pacing techniques, use of adaptive equipment and posture/positioning to relieve shortness of breath.   Triggers: - Group verbal and written instruction to review types of environmental controls: home humidity, furnaces, filters, dust mite/pet prevention, HEPA vacuums. To discuss weather changes, air quality and the benefits of nasal washing.   Exacerbations: - Group verbal and written instruction to provide: warning signs, infection symptoms, calling MD promptly, preventive modes, and value of vaccinations. Review: effective airway clearance, coughing and/or vibration techniques. Create an Sports administrator.   Oxygen: - Individual  and group verbal and written instruction on oxygen therapy. Includes supplement oxygen, available portable oxygen systems, continuous and intermittent flow rates, oxygen safety, concentrators, and Medicare reimbursement for oxygen.      Pulmonary Rehab from 09/17/2015 in [facility] Cardiac and Pulmonary Rehab   Date  09/17/15   Educator  LB   Instruction Review Code  2- meets goals/outcomes      Respiratory Medications: - Group verbal and written instruction to review medications for lung disease. Drug class, frequency, complications, importance of spacers, rinsing mouth after steroid MDI's, and proper cleaning methods for nebulizers.      Pulmonary Rehab from 09/17/2015 in [facility] Cardiac and Pulmonary Rehab   Date  09/17/15   Educator  LB   Instruction Review Code  2- meets goals/outcomes      AED/CPR: - Group verbal and written instruction with the use of models to demonstrate the basic use of the AED with the basic ABC's of resuscitation.   Breathing Retraining: - Provides individuals verbal and written instruction on purpose, frequency, and proper technique of diaphragmatic breathing and pursed-lipped breathing. Applies individual practice skills.      Pulmonary Rehab from 09/17/2015 in [facility] Cardiac and Pulmonary Rehab   Date  09/17/15   Educator  LB   Instruction Review Code  2- meets goals/outcomes      Anatomy and Physiology of the Lungs: - Group verbal and written instruction with the use of models to provide basic lung anatomy and physiology related to function, structure and complications of lung disease.   Heart Failure: - Group verbal and written instruction on the basics of heart failure: signs/symptoms, treatments, explanation of ejection fraction, enlarged heart and cardiomyopathy.   Sleep Apnea: - Individual verbal and written instruction to review Obstructive Sleep Apnea. Review of risk factors, methods for diagnosing and types of masks and machines for OSA.   Anxiety: -  Provides group, verbal and written instruction on the correlation between heart/lung disease and anxiety, treatment options, and management of anxiety.   Relaxation: - Provides group, verbal and written instruction about the benefits of relaxation for patients with heart/lung disease. Also provides patients with examples of relaxation techniques.   Knowledge Questionnaire Score:     Knowledge Questionnaire Score - 09/17/15 0900    Knowledge Questionnaire Score   Pre Score 7/10       Core Components/Risk Factors/Patient Goals at Admission:     Personal Goals and Risk Factors at Admission - 09/17/15 0900    Core Components/Risk Factors/Patient Goals on Admission   Sedentary Yes  Ms Schepers's goal is to increase her walking. she may plan to walk in their pool this summer.   Intervention Provide advice, education, support and counseling about physical activity/exercise needs.;Develop  an individualized exercise prescription for aerobic and resistive training based on initial evaluation findings, risk stratification, comorbidities and participant's personal goals.   Expected Outcomes Achievement of increased cardiorespiratory fitness and enhanced flexibility, muscular endurance and strength shown through measurements of functional capacity and personal statement of participant.   Increase Strength and Stamina Yes   Intervention Provide advice, education, support and counseling about physical activity/exercise needs.;Develop an individualized exercise prescription for aerobic and resistive training based on initial evaluation findings, risk stratification, comorbidities and participant's personal goals.   Expected Outcomes Achievement of increased cardiorespiratory fitness and enhanced flexibility, muscular endurance and strength shown through measurements of functional capacity and personal statement of participant.   Improve shortness of breath with ADL's Yes   Intervention Provide  education, individualized exercise plan and daily activity instruction to help decrease symptoms of SOB with activities of daily living.   Expected Outcomes Short Term: Achieves a reduction of symptoms when performing activities of daily living.   Develop more efficient breathing techniques such as purse lipped breathing and diaphragmatic breathing; and practicing self-pacing with activity Yes   Intervention Provide education, demonstration and support about specific breathing techniuqes utilized for more efficient breathing. Include techniques such as pursed lipped breathing, diaphragmatic breathing and self-pacing activity.   Expected Outcomes Short Term: Participant will be able to demonstrate and use breathing techniques as needed throughout daily activities.   Increase knowledge of respiratory medications and ability to use respiratory devices properly  Yes  Uses oxygen for sleep and activity - 1l/m; she needs to be encouraged to use with activity on continuous flow; uses Advair, Spiriva, Albuterol MDIs and SVN with Albuterol.   Intervention Provide education and demonstration as needed of appropriate use of medications, inhalers, and oxygen therapy.   Expected Outcomes Short Term: Achieves understanding of medications use. Understands that oxygen is a medication prescribed by physician. Demonstrates appropriate use of inhaler and oxygen therapy.   Hypertension Yes   Intervention Provide education on lifestyle modifcations including regular physical activity/exercise, weight management, moderate sodium restriction and increased consumption of fresh fruit, vegetables, and low fat dairy, alcohol moderation, and smoking cessation.;Monitor prescription use compliance.   Expected Outcomes Short Term: Continued assessment and intervention until BP is < 140/62mm HG in hypertensive participants. < 130/75mm HG in hypertensive participants with diabetes, heart failure or chronic kidney disease.;Long Term:  Maintenance of blood pressure at goal levels.      Core Components/Risk Factors/Patient Goals Review:      Goals and Risk Factor Review      09/23/15 1213           Core Components/Risk Factors/Patient Goals Review   Personal Goals Review Develop more efficient breathing techniques such as purse lipped breathing and diaphragmatic breathing and practicing self-pacing with activity.       Review Pursed lip breathing techniques were discussed and demonstrated with patient. Patient demonstrated understanding of these techniques.       Expected Outcomes Patient will independenly use pursed lip breathing techniques to help tolerate exercise.           Core Components/Risk Factors/Patient Goals at Discharge (Final Review):      Goals and Risk Factor Review - 09/23/15 1213    Core Components/Risk Factors/Patient Goals Review   Personal Goals Review Develop more efficient breathing techniques such as purse lipped breathing and diaphragmatic breathing and practicing self-pacing with activity.   Review Pursed lip breathing techniques were discussed and demonstrated with patient. Patient demonstrated understanding of these techniques.  Expected Outcomes Patient will independenly use pursed lip breathing techniques to help tolerate exercise.       ITP Comments:   Comments: 30 day review

## 2015-09-23 NOTE — Progress Notes (Signed)
Daily Session Note  Patient Details  Name: Kelda Azad MRN: [id number] Date of Birth: 12/01/1945 Referring Provider:        Pulmonary Rehab from 09/17/2015 in [facility] Re[facility]   Referring Provider  Clayborn Bigness      Encounter Date: 09/23/2015  Check In:     Session Check In - 09/23/15 1152    Check-In   Location [facility] & Pul[facility] Re[facility]   Staff Present Carson Myrtle, BS, RRT, Respiratory Therapist;Jacquelina Hewins Amedeo Plenty, BS, ACSM CEP, Exercise Physiologist;Jessica Luan Pulling, MA, ACSM RCEP, Exercise Physiologist   Supervising physician immediately available to respond to emergencies [facility] immediately available ER MD   Physician(s) Dr. Marcelene Butte and Joni Fears   Medication changes reported     No   Fall or balance concerns reported    No   Warm-up and Cool-down Performed on first and last piece of equipment   Resistance Training Performed Yes   VAD Patient? No   Pain Assessment   Currently in Pain? No/denies   Multiple Pain Sites No         Goals Met:  Proper associated with RPD/PD & O2 Sat Independence with exercise equipment Exercise tolerated well Strength training completed today  Goals Unmet:  Not Applicable  Comments: Patient completed exercise prescription and all exercise goals during rehab session. The exercise was tolerated well and the patient is progressing in the program.     Dr. Emily Filbert is Medical Director for [place] and [facility].

## 2015-09-23 NOTE — Telephone Encounter (Signed)
Patient has appointment on 11/07/15  She has only a couple of pills left of Crestor and Tramadol, no Protonix

## 2015-09-24 ENCOUNTER — Other Ambulatory Visit: Payer: Self-pay | Admitting: Family Medicine

## 2015-09-24 DIAGNOSIS — M19041 Primary osteoarthritis, right hand: Secondary | ICD-10-CM

## 2015-09-24 DIAGNOSIS — M19042 Primary osteoarthritis, left hand: Principal | ICD-10-CM

## 2015-09-24 MED ORDER — TRAMADOL HCL 50 MG PO TABS: ORAL_TABLET | ORAL | Status: DC

## 2015-09-27 ENCOUNTER — Encounter: Payer: PPO | Admitting: Respiratory Therapy

## 2015-09-27 DIAGNOSIS — J449 Chronic obstructive pulmonary disease, unspecified: Secondary | ICD-10-CM | POA: Diagnosis not present

## 2015-09-27 NOTE — Progress Notes (Signed)
Daily Session Note  Patient Details  Name: Joyce Rodriguez MRN: [id number] Date of Birth: [birth year] Referring Provider:        Pulmonary Rehab from 09/17/2015 in [facility] Card[facility] and Pulmonary Rehab   Referring Provider  Clayborn Bigness      Encounter Date: 09/27/2015  Check In:     Session Check In - 09/27/15 1222    Check-In   Location [facility]   Staff Present Heath Lark, RN, BSN, CCRP;Carroll Enterkin, RN, Michaela Corner, RRT, RCP, Respiratory Therapist   Supervising physician immediately available to respond to emergencies [facility] immediately available ER MD   Physician(s) Dr. Marcelene Butte and Jacqualine Code   Medication changes reported     No   Fall or balance concerns reported    No   Warm-up and Cool-down Performed on first and last piece of equipment   Resistance Training Performed Yes   VAD Patient? No   Pain Assessment   Currently in Pain? No/denies         Goals Met:  Proper associated with RPD/PD & O2 Sat Independence with exercise equipment Using PLB without cueing & demonstrates good technique Exercise tolerated well Strength training completed today  Goals Unmet:  Not Applicable  Comments: Pt able to follow exercise prescription today without complaint.  Will continue to monitor for progression.    Dr. Emily Filbert is Medical Director for [place] and [facility].

## 2015-09-30 ENCOUNTER — Encounter: Payer: PPO | Admitting: *Deleted

## 2015-09-30 DIAGNOSIS — J449 Chronic obstructive pulmonary disease, unspecified: Secondary | ICD-10-CM

## 2015-09-30 NOTE — Progress Notes (Signed)
Daily Session Note  Patient Details  Name: Joyce Rodriguez MRN: [id number] Date of Birth: 07/15/1945 Referring Provider:        Pulmonary Rehab from 09/17/2015 in [facility] and Pulmonary Rehab   Referring Provider  Khan, fozia      Encounter Date: 09/30/2015  Check In:     Session Check In - 09/30/15 1203    Check-In   Location [facility]   Staff Present Laureen Brown, BS, RRT, Respiratory Therapist;Kelly Hayes, BS, ACSM CEP, Exercise Physiologist;Jessica Hawkins, MA, ACSM RCEP, Exercise Physiologist   Supervising physician immediately available to respond to emergencies [facility] immediately available ER MD   Physician(s) Schaevitz and Stafford   Medication changes reported     No   Fall or balance concerns reported    No   Warm-up and Cool-down Performed on first and last piece of equipment   Resistance Training Performed Yes   VAD Patient? No   Pain Assessment   Currently in Pain? No/denies   Multiple Pain Sites No         Goals Met:  Proper associated with RPD/PD & O2 Sat Independence with exercise equipment Exercise tolerated well Strength training completed today  Goals Unmet:  Not Applicable  Comments: Patient completed exercise prescription and all exercise goals during rehab session. The exercise was tolerated well and the patient is progressing in the program.     Dr. Mark Miller is Medical Director for [facility] and [facility]. 

## 2015-10-01 DIAGNOSIS — J441 Chronic obstructive pulmonary disease with (acute) exacerbation: Secondary | ICD-10-CM | POA: Diagnosis not present

## 2015-10-02 DIAGNOSIS — J449 Chronic obstructive pulmonary disease, unspecified: Secondary | ICD-10-CM

## 2015-10-02 NOTE — Progress Notes (Signed)
Daily Session Note  Patient Details  Name: Joyce Rodriguez MRN: [id number] Date of Birth: Sep 10, 1945 Referring Provider:        Pulmonary Rehab from 09/17/2015 in [facility] Cardiac and Pulmonary Rehab   Referring Provider  Clayborn Bigness      Encounter Date: 10/02/2015  Check In:     Session Check In - 10/02/15 1203    Check-In   Location [facility]   Staff Present Heath Lark, RN, BSN, CCRP;Laureen Owens Shark, BS, RRT, Respiratory Dareen Piano, BA, ACSM CEP, Exercise Physiologist   Supervising physician immediately available to respond to emergencies [facility] immediately available ER MD   Physician(s) Lovena Le and Paduchowitz   Medication changes reported     No   Fall or balance concerns reported    No   Warm-up and Cool-down Performed on first and last piece of equipment   Resistance Training Performed Yes   VAD Patient? No   Pain Assessment   Currently in Pain? No/denies           Exercise Prescription Changes - 10/01/15 1500    Exercise Review   Progression Yes   Response to Exercise   Blood Pressure (Admit) 124/80 mmHg   Blood Pressure (Exercise) 160/84 mmHg   Blood Pressure (Exit) 102/60 mmHg   Heart Rate (Admit) 94 bpm   Heart Rate (Exercise) 118 bpm   Heart Rate (Exit) 102 bpm   Oxygen Saturation (Admit) 95 %   Oxygen Saturation (Exercise) 88 %   Oxygen Saturation (Exit) 96 %   Rating of Perceived Exertion (Exercise) 13   Perceived Dyspnea (Exercise) 3   Symptoms shortness of breath on treadmill   Duration Progress to 45 minutes of aerobic exercise without signs/symptoms of physical distress   Intensity THRR unchanged   Progression   Progression Continue to progress workloads to maintain intensity without signs/symptoms of physical distress.   Average METs 2.1   Resistance Training   Training Prescription Yes   Weight 2   Reps 10-12   Interval Training   Interval Training No   Oxygen   Oxygen Continuous   Liters 2   Treadmill   MPH 1.2   Grade 0   Minutes 15   METs 1.92   NuStep   Level 3   Watts 27   Minutes 15   METs 2   Biostep-RELP   Level 3   Watts 23   Minutes 15   METs 2      Goals Met:  Proper associated with RPD/PD & O2 Sat Independence with exercise equipment Exercise tolerated well Strength training completed today  Goals Unmet:  Not Applicable  Comments: Pt able to follow exercise prescription today without complaint.  Will continue to monitor for progression.    Dr. Emily Filbert is Medical Director for [place] and [facility].

## 2015-10-07 ENCOUNTER — Encounter: Payer: PPO | Admitting: *Deleted

## 2015-10-07 ENCOUNTER — Ambulatory Visit: Payer: BLUE CROSS/BLUE SHIELD | Admitting: Family Medicine

## 2015-10-07 DIAGNOSIS — J449 Chronic obstructive pulmonary disease, unspecified: Secondary | ICD-10-CM | POA: Diagnosis not present

## 2015-10-07 NOTE — Progress Notes (Signed)
Daily Session Note  Patient Details  Name: Derin Matthes MRN: [id number] Date of Birth: 08-Mar-1946 Referring Provider:        Pulmonary Rehab from 09/17/2015 in [facility] Cardiac and Pulmonary Rehab   Referring Provider  Clayborn Bigness      Encounter Date: 10/07/2015  Check In:     Session Check In - 10/07/15 1122    Check-In   Location [facility]   Staff Present Carson Myrtle, BS, RRT, Respiratory Bertis Ruddy, BS, ACSM CEP, Exercise Physiologist;Mary Kellie Shropshire, RN, BSN, MA   Supervising physician immediately available to respond to emergencies [facility] immediately available ER MD   Physician(s) Marcelene Butte and Corky Downs   Medication changes reported     No   Fall or balance concerns reported    No   Warm-up and Cool-down Performed on first and last piece of equipment   Resistance Training Performed Yes   VAD Patient? No   Pain Assessment   Currently in Pain? No/denies   Multiple Pain Sites No         Goals Met:  Proper associated with RPD/PD & O2 Sat Independence with exercise equipment Exercise tolerated well Strength training completed today  Goals Unmet:  Not Applicable  Comments: Patient completed exercise prescription and all exercise goals during rehab session. The exercise was tolerated well and the patient is progressing in the program.     Dr. Emily Filbert is Medical Director for [place] and [facility].

## 2015-10-09 ENCOUNTER — Encounter: Payer: PPO | Admitting: *Deleted

## 2015-10-10 NOTE — Addendum Note (Signed)
Addended by: Lynford Humphrey on: 10/10/2015 01:28 PM   Modules accepted: Level of Service, SmartSet

## 2015-10-10 NOTE — Progress Notes (Signed)
Documentation completed for risk factor review

## 2015-10-10 NOTE — Progress Notes (Signed)
This encounter was created in error - please disregard.

## 2015-10-11 ENCOUNTER — Encounter: Payer: PPO | Admitting: *Deleted

## 2015-10-11 DIAGNOSIS — J449 Chronic obstructive pulmonary disease, unspecified: Secondary | ICD-10-CM

## 2015-10-11 NOTE — Progress Notes (Signed)
Daily Session Note  Patient Details  Name: Joyce Rodriguez MRN: [id number] Date of Birth: Aug 14, 1945 Referring Provider:        Pulmonary Rehab from 09/17/2015 in [facility] Card[facility] and Pulmonary Rehab   Referring Provider  Clayborn Bigness      Encounter Date: 10/11/2015  Check In:     Session Check In - 10/11/15 1100    Check-In   Location [facility]   Staff Present Gerlene Burdock, RN, BSN;Susanne Bice, RN, BSN, CCRP;Rebecca Sickles, DPT, CEEA   Supervising physician immediately available to respond to emergencies [facility] immediately available ER MD   Physician(s) Dr. Jimmye Norman Dr. Corky Downs   Medication changes reported     No   Fall or balance concerns reported    No   Warm-up and Cool-down Performed on first and last piece of equipment   Resistance Training Performed Yes   VAD Patient? No   Pain Assessment   Currently in Pain? No/denies         Goals Met:  Proper associated with RPD/PD & O2 Sat Exercise tolerated well  Goals Unmet:  Not Applicable  Comments:     Dr. Emily Filbert is Medical Director for [place] and [facility].

## 2015-10-14 ENCOUNTER — Encounter: Payer: PPO | Attending: Internal Medicine | Admitting: *Deleted

## 2015-10-14 DIAGNOSIS — J449 Chronic obstructive pulmonary disease, unspecified: Secondary | ICD-10-CM | POA: Diagnosis not present

## 2015-10-14 NOTE — Progress Notes (Signed)
Daily Session Note  Patient Details  Name: Joyce Rodriguez MRN: [id number] Date of Birth: [birth year] Referring Provider:        Pulmonary Rehab from 09/17/2015 in [facility] Cardiac and Pulmonary Rehab   Referring Provider  Clayborn Bigness      Encounter Date: 10/14/2015  Check In:     Session Check In - 10/14/15 1221    Check-In   Location [facility]   Staff Present Carson Myrtle, BS, RRT, Respiratory Therapist;Mary Kellie Shropshire, RN, BSN, [place], BS, ACSM CEP, Exercise Physiologist   Supervising physician immediately available to respond to emergencies [facility] immediately available ER MD   Physician(s) Jimmye Norman and Joni Fears   Medication changes reported     No   Fall or balance concerns reported    No   Warm-up and Cool-down Performed on first and last piece of equipment   Resistance Training Performed Yes   VAD Patient? No   Pain Assessment   Currently in Pain? No/denies   Multiple Pain Sites No         Goals Met:  Proper associated with RPD/PD & O2 Sat Independence with exercise equipment Exercise tolerated well Personal goals reviewed Strength training completed today  Goals Unmet:  Not Applicable  Comments: Hailly has reported that she is having less SOB with daily activities. She has been feeling like she has more energy and has been walking in her pool at home on the days that she does not come to class.    Dr. Emily Filbert is Medical Director for [place] and [facility].

## 2015-10-18 ENCOUNTER — Encounter: Payer: PPO | Admitting: *Deleted

## 2015-10-18 DIAGNOSIS — J449 Chronic obstructive pulmonary disease, unspecified: Secondary | ICD-10-CM

## 2015-10-18 NOTE — Progress Notes (Signed)
Daily Session Note  Patient Details  Name: Toriann Spadoni MRN: [id number] Date of Birth: 1945-05-07 Referring Provider:        Pulmonary Rehab from 09/17/2015 in [facility] Cardiac and Pulmonary Rehab   Referring Provider  Clayborn Bigness      Encounter Date: 10/18/2015  Check In:     Session Check In - 10/18/15 1031    Check-In   Location [facility]   Staff Present Gerlene Burdock, RN, BSN;Mary Kellie Shropshire, RN, BSN, [place], BS, RRT, Respiratory Therapist   Supervising physician immediately available to respond to emergencies [facility] immediately available ER MD   Medication changes reported     No   Fall or balance concerns reported    No   Warm-up and Cool-down Performed on first and last piece of equipment   Resistance Training Performed Yes   VAD Patient? No   Pain Assessment   Currently in Pain? No/denies         Goals Met:  Proper associated with RPD/PD & O2 Sat Exercise tolerated well  Goals Unmet:  Not Applicable  Comments:     Dr. Emily Filbert is Medical Director for [place] and [facility].

## 2015-10-21 ENCOUNTER — Encounter: Payer: Self-pay | Admitting: *Deleted

## 2015-10-21 ENCOUNTER — Encounter: Payer: PPO | Admitting: *Deleted

## 2015-10-21 DIAGNOSIS — J449 Chronic obstructive pulmonary disease, unspecified: Secondary | ICD-10-CM

## 2015-10-21 NOTE — Progress Notes (Signed)
Daily Session Note  Patient Details  Name: Joyce Rodriguez MRN: [id number] Date of Birth: 13-Jun-1945 Referring Provider:        Pulmonary Rehab from 09/17/2015 in [facility] Cardiac and Pulmonary Rehab   Referring Provider  Clayborn Bigness      Encounter Date: 10/21/2015  Check In:     Session Check In - 10/21/15 1044    Check-In   Location [facility]   Staff Present Carson Myrtle, BS, RRT, Respiratory Therapist;Leatrice Parilla Amedeo Plenty, BS, ACSM CEP, Exercise Physiologist;Jessica Luan Pulling, MA, ACSM RCEP, Exercise Physiologist   Supervising physician immediately available to respond to emergencies [facility] immediately available ER MD   Physician(s) Darl Householder and Corky Downs   Medication changes reported     No   Fall or balance concerns reported    No   Warm-up and Cool-down Performed on first and last piece of equipment   Resistance Training Performed Yes   VAD Patient? No   Pain Assessment   Currently in Pain? No/denies   Multiple Pain Sites No         Goals Met:  Proper associated with RPD/PD & O2 Sat Independence with exercise equipment Exercise tolerated well Strength training completed today  Goals Unmet:  Not Applicable  Comments: Patient completed exercise prescription and all exercise goals during rehab session. The exercise was tolerated well and the patient is progressing in the program.     Dr. Emily Filbert is Medical Director for [place] and [facility].

## 2015-10-21 NOTE — Progress Notes (Signed)
Pulmonary Individual Treatment Plan  Patient Details  Name: Joyce Rodriguez MRN: [id number] Date of Birth: 08-23-1945 Referring Provider:        Pulmonary Rehab from 09/17/2015 in [facility] Cardiac and Pulmonary Rehab   Referring Provider  Clayborn Bigness      Initial Encounter Date:       Pulmonary Rehab from 09/17/2015 in [facility] Cardiac and Pulmonary Rehab   Date  09/17/15   Referring Provider  Clayborn Bigness      Visit Diagnosis: COPD, moderate ([place])  Patient's Home Medications on Admission:  Current outpatient prescriptions:  .  albuterol (PROVENTIL HFA;VENTOLIN HFA) 108 (90 BASE) MCG/ACT inhaler, Inhale 2 puffs into the lungs every 6 (six) hours as needed for shortness of breath., Disp: , Rfl:  .  aspirin EC 81 MG tablet, Take 81 mg by mouth daily., Disp: , Rfl:  .  escitalopram (LEXAPRO) 20 MG tablet, Take 1 tablet (20 mg total) by mouth daily., Disp: 90 tablet, Rfl: 4 .  Fluticasone-Salmeterol (ADVAIR) 250-50 MCG/DOSE AEPB, Inhale 1 puff into the lungs 2 (two) times daily., Disp: , Rfl:  .  losartan (COZAAR) 100 MG tablet, Take 1 tablet (100 mg total) by mouth daily., Disp: 90 tablet, Rfl: 4 .  pantoprazole (PROTONIX) 40 MG tablet, Take 1 tablet (40 mg total) by mouth daily., Disp: 30 tablet, Rfl: 1 .  raloxifene (EVISTA) 60 MG tablet, TAKE 1 TABLET DAILY, Disp: 90 tablet, Rfl: 3 .  rosuvastatin (CRESTOR) 20 MG tablet, Take 1 tablet (20 mg total) by mouth daily., Disp: 30 tablet, Rfl: 1 .  tiotropium (SPIRIVA) 18 MCG inhalation capsule, Place 18 mcg into inhaler and inhale daily., Disp: , Rfl:  .  traMADol (ULTRAM) 50 MG tablet, 1-2 tabs q6 hours as needed for pain, Disp: 100 tablet, Rfl: 0  Past Medical History: Past Medical History  Diagnosis Date  . Hyperlipidemia   . Osteoporosis   . COPD (chronic obstructive pulmonary disease) (HCC)     Tobacco Use: History  Smoking status  . Former Smoker  . Types: Cigarettes  . Quit date: 03/18/2001  Smokeless tobacco  . Never Used     Labs: Recent Review Flowsheet Data    Labs for ITP Cardiac and Pulmonary Rehab Latest Ref Rng 03/13/2014 09/12/2014 03/19/2015   Cholestrol 100 - 199 mg/dL 269(A) 168 179   LDLCALC 0 - 99 mg/dL 151 67 76   HDL >39 mg/dL 89(A) 84 84   Trlycerides 0 - 149 mg/dL 144 86 96       ADL UCSD:     Pulmonary Assessment Scores      09/17/15 0900       ADL UCSD   ADL Phase Entry     SOB Score total 27     Rest 0     Walk 2     Stairs 3     Bath 2     Dress 1     Shop 2        Pulmonary Function Assessment:     Pulmonary Function Assessment - 09/17/15 0900    Pulmonary Function Tests   RV% 167 %   DLCO% 28 %   Initial Spirometry Results   FVC% 61 %   FEV1% 36 %   FEV1/FVC Ratio 46   Post Bronchodilator Spirometry Results   FVC% 68 %   FEV1% 37 %   FEV1/FVC Ratio 43   Breath   Bilateral Breath Sounds Decreased;Clear   Shortness of Breath Yes;Fear of  Shortness of Breath;Limiting activity      Exercise Target Goals:    Exercise Program Goal: Individual exercise prescription set with THRR, safety & activity barriers. Participant demonstrates ability to understand and report RPE using BORG scale, to self-measure pulse accurately, and to acknowledge the importance of the exercise prescription.  Exercise Prescription Goal: Starting with aerobic activity 30 plus minutes a day, 3 days per week for initial exercise prescription. Provide home exercise prescription and guidelines that participant acknowledges understanding prior to discharge.  Activity Barriers & Risk Stratification:     Activity Barriers & Cardiac Risk Stratification - 09/17/15 0900    Activity Barriers & Cardiac Risk Stratification   Activity Barriers Shortness of Breath;Deconditioning;Muscular Weakness   Cardiac Risk Stratification Moderate      6 Minute Walk:     6 Minute Walk      09/17/15 1536       6 Minute Walk   Distance 858 feet     Walk Time 6 minutes     # of Rest Breaks 0     MPH  1.63     METS 2.48     RPE 13     VO2 Peak 8.7     Symptoms No     Resting HR 100 bpm     Resting BP 118/66 mmHg     Max Ex. HR 120 bpm     Max Ex. BP 132/68 mmHg     Interval HR   Baseline HR 100     1 Minute HR 110     2 Minute HR 114     3 Minute HR 116     4 Minute HR 118     5 Minute HR 120     6 Minute HR 118     2 Minute Post HR 106     Interval Heart Rate? Yes     Interval Oxygen   Interval Oxygen? Yes     Baseline Oxygen Saturation % 92 %     Baseline Liters of Oxygen 1 L     1 Minute Oxygen Saturation % 95 %     1 Minute Liters of Oxygen 1 L     2 Minute Oxygen Saturation % 93 %     2 Minute Liters of Oxygen 1 L     3 Minute Oxygen Saturation % 91 %     3 Minute Liters of Oxygen 1 L     4 Minute Oxygen Saturation % 88 %     4 Minute Liters of Oxygen 1 L     5 Minute Oxygen Saturation % 88 %     5 Minute Liters of Oxygen 1 L     6 Minute Oxygen Saturation % 88 %     6 Minute Liters of Oxygen 1 L     2 Minute Post Oxygen Saturation % 94 %     2 Minute Post Liters of Oxygen 1 L        Initial Exercise Prescription:     Initial Exercise Prescription - 09/17/15 1500    Date of Initial Exercise RX and Referring Provider   Date 09/17/15   Referring Provider Clayborn Bigness   Oxygen   Oxygen Continuous   Liters 1   Treadmill   MPH 1.5   Grade 0   Minutes 15   METs 2.4   NuStep   Level 3   Watts 30   Minutes 15   METs 2.4  Recumbant Elliptical   Level 1   RPM 50   Watts 30   Minutes 15   METs 2.4   Biostep-RELP   Level 3   Watts 30   Minutes 15   METs 2.4   Prescription Details   Frequency (times per week) 3-5   Duration Progress to 45 minutes of aerobic exercise without signs/symptoms of physical distress   Intensity   THRR 40-80% of Max Heartrate 120-141   Ratings of Perceived Exertion 11-15   Perceived Dyspnea 0-4   Progression   Progression Continue to progress workloads to maintain intensity without signs/symptoms of physical distress.    Resistance Training   Training Prescription Yes   Weight 2   Reps 10-12      Perform Capillary Blood Glucose checks as needed.  Exercise Prescription Changes:     Exercise Prescription Changes      09/23/15 1200 10/01/15 1500 10/16/15 1500       Exercise Review   Progression  Yes Yes     Response to Exercise   Blood Pressure (Admit)  124/80 mmHg 120/68 mmHg     Blood Pressure (Exercise)  160/84 mmHg 148/82 mmHg     Blood Pressure (Exit)  102/60 mmHg 100/70 mmHg     Heart Rate (Admit)  94 bpm 92 bpm     Heart Rate (Exercise)  118 bpm 120 bpm     Heart Rate (Exit)  102 bpm 103 bpm     Oxygen Saturation (Admit)  95 % 91 %     Oxygen Saturation (Exercise)  88 % 89 %     Oxygen Saturation (Exit)  96 % 97 %     Rating of Perceived Exertion (Exercise)  13 11     Perceived Dyspnea (Exercise)  3 3     Symptoms  shortness of breath on treadmill      Duration  Progress to 45 minutes of aerobic exercise without signs/symptoms of physical distress Progress to 45 minutes of aerobic exercise without signs/symptoms of physical distress     Intensity  THRR unchanged THRR unchanged     Progression   Progression  Continue to progress workloads to maintain intensity without signs/symptoms of physical distress. Continue to progress workloads to maintain intensity without signs/symptoms of physical distress.     Average METs  2.1 2.8     Resistance Training   Training Prescription Yes Yes Yes     Weight 2 2 2      Reps 10-12 10-12 10-12     Interval Training   Interval Training  No No     Oxygen   Oxygen Continuous Continuous Continuous     Liters 1 2 2      Treadmill   MPH 1.2 1.2 1.4     Grade 0 0 0     Minutes 15 15 15      METs 2.4 1.92 2.07     NuStep   Level 3 3 4      Watts 25 27 36     Minutes 15 15 15      METs 2.4 2 2.7     Recumbant Elliptical   Level 1  omitted today due to time       RPM 50       Watts 30       Minutes 15       METs 2.4       Biostep-RELP   Level 3  3 3  Watts 20 23 30      Minutes 8 15 15      METs 2.4 2 2.5     Home Exercise Plan   Plans to continue exercise at   Home  walking in pool at home     Frequency   Add 2 additional days to program exercise sessions.        Exercise Comments:     Exercise Comments      09/17/15 1605 09/23/15 1213 10/01/15 1551 10/14/15 1222 10/16/15 1515   Exercise Comments Pt did not report any musculoskeletal issues - expected to progress well with exercise.  Noticed that she felt better during 6 min walk with 1 L contiuous oxygen. First full day of exercise!  Patient was oriented to gym and equipment including functions, settings, policies, and procedures.  Patient's individual exercise prescription and treatment plan were reviewed.  All starting workloads were established based on the results of the 6 minute walk test done at initial orientation visit.  The plan for exercise progression was also introduced and progression will be customized based on patient's performance and goals. Syriana is off to a good start with her rehab.  She seems to be enjoying coming to class.  She has already progressed to a full 15 min on the treadmill!  We will continue to work with her to increase her workloads to build up strength and stamina. Fallan has reported that she is having less SOB with daily activities. She has been feeling like she has more energy and has been walking in her pool at home on the days that she does not come to class.  Gaylyn has been doing great with exercise.  She continues to make good progression and we hope she will continue to do so.      Discharge Exercise Prescription (Final Exercise Prescription Changes):     Exercise Prescription Changes - 10/16/15 1500    Exercise Review   Progression Yes   Response to Exercise   Blood Pressure (Admit) 120/68 mmHg   Blood Pressure (Exercise) 148/82 mmHg   Blood Pressure (Exit) 100/70 mmHg   Heart Rate (Admit) 92 bpm   Heart Rate (Exercise) 120 bpm    Heart Rate (Exit) 103 bpm   Oxygen Saturation (Admit) 91 %   Oxygen Saturation (Exercise) 89 %   Oxygen Saturation (Exit) 97 %   Rating of Perceived Exertion (Exercise) 11   Perceived Dyspnea (Exercise) 3   Duration Progress to 45 minutes of aerobic exercise without signs/symptoms of physical distress   Intensity THRR unchanged   Progression   Progression Continue to progress workloads to maintain intensity without signs/symptoms of physical distress.   Average METs 2.8   Resistance Training   Training Prescription Yes   Weight 2   Reps 10-12   Interval Training   Interval Training No   Oxygen   Oxygen Continuous   Liters 2   Treadmill   MPH 1.4   Grade 0   Minutes 15   METs 2.07   NuStep   Level 4   Watts 36   Minutes 15   METs 2.7   Biostep-RELP   Level 3   Watts 30   Minutes 15   METs 2.5   Home Exercise Plan   Plans to continue exercise at Home  walking in pool at home   Frequency Add 2 additional days to program exercise sessions.       Nutrition:  Target Goals: Understanding of nutrition guidelines,  daily intake of sodium <1513m, cholesterol <2011m calories 30% from fat and 7% or less from saturated fats, daily to have 5 or more servings of fruits and vegetables.  Biometrics:     Pre Biometrics - 09/17/15 1608    Pre Biometrics   Height 5' 3"  (1.6 m)   Weight 142 lb (64.411 kg)   Waist Circumference 36 inches   Hip Circumference 41.25 inches   Waist to Hip Ratio 0.87 %   BMI (Calculated) 25.2       Nutrition Therapy Plan and Nutrition Goals:     Nutrition Therapy & Goals - 09/17/15 0900    Nutrition Therapy   Diet Prefers not to see the dietitian; Husband cooks healthy meals with vegetables; she does not use salt, but does like sweets; her gopal is to loss 5lbs.      Nutrition Discharge: Rate Your Plate Scores:   Psychosocial: Target Goals: Acknowledge presence or absence of depression, maximize coping skills, provide positive support  system. Participant is able to verbalize types and ability to use techniques and skills needed for reducing stress and depression.  Initial Review & Psychosocial Screening:     Initial Psych Review & Screening - 09/17/15 0900    Family Dynamics   Good Support System? Yes   Comments Ms CaArteagaas good support from her children and husband. She states she has no depression, although she does miss walking distances without shortness of breath and is looking forward to partivccipation in [facility].   Barriers   Psychosocial barriers to participate in program The patient should benefit from training in stress management and relaxation.;There are no identifiable barriers or psychosocial needs.   Screening Interventions   Interventions Encouraged to exercise      Quality of Life Scores:     Quality of Life - 09/17/15 0900    Quality of Life Scores   Health/Function Pre 20.19 %   Socioeconomic Pre 26 %   Psych/Spiritual Pre 24.86 %   Family Pre 27.6 %   GLOBAL Pre 23.26 %      PHQ-9:     Recent Review Flowsheet Data    Depression screen [employer]9 09/17/2015 09/12/2014   Decreased Interest 0 0   Down, Depressed, Hopeless 0 0   PHQ - 2 Score 0 0   Altered sleeping 0 -   Tired, decreased energy 1 -   Change in appetite 0 -   Feeling bad or failure about yourself  0 -   Trouble concentrating 0 -   Moving slowly or fidgety/restless 0 -   Suicidal thoughts 0 -   PHQ-9 Score 1 -   Difficult doing work/chores Somewhat difficult -      Psychosocial Evaluation and Intervention:     Psychosocial Evaluation - 09/30/15 1116    Psychosocial Evaluation & Interventions   Interventions Encouraged to exercise with the program and follow exercise prescription   Comments Counselor met with Ms. CaSenoday for initial psychosocial evaluation.  She is a [age]ear old who has been diagnosed with COPD.  Ms. CaFarleras a strong support system with a spouse of 25 years and (3) adult children who  live close by.  She reports sleeping "okay" for the most part and occasionally has to take an OTC sleep aid.  She denies a history of depression or anxiety, although she states her daughter was concerned about her at one point and insisted she get on Rx for symptoms.  So she has been  taking Rx  for her mood for many years and denies any current symptoms.  She states her mood now is typically positive and she has minimal stress in her life.  Her goals for this program are to improve her breathing and increase her energy to be able to do more and travel more with her husband who just retired.  Counselor will follow with Ms. Rudy throughout the course of this program.        Psychosocial Re-Evaluation:     Psychosocial Re-Evaluation      10/14/15 1144           Psychosocial Re-Evaluation   Comments Counselor follow up with Ms. Loud reporting she is "surprised" how much she is enjoying this class and how much progress she is already experiencing.  She stated she was able to walk 25 laps in her pool this past weekend and her family have mentioned how much stronger her voice sounds on the phone lately.  Ms. Sosinski  maintains a positive attitude and reports minimal stress in her life at this time.  Counselor will continue to follow with Ms. Riesgo and commended her on her progress and commitment to consistency in exercising, both here and at home.           Education: Education Goals: Education classes will be provided on a weekly basis, covering required topics. Participant will state understanding/return demonstration of topics presented.  Learning Barriers/Preferences:     Learning Barriers/Preferences - 09/17/15 0900    Learning Barriers/Preferences   Learning Barriers None   Learning Preferences Group Instruction;Individual Instruction;Pictoral;Skilled Demonstration;Verbal Instruction;Video;Written Material      Education Topics: Initial Evaluation Education: - Verbal,  written and demonstration of respiratory meds, RPE/PD scales, oximetry and breathing techniques. Instruction on use of nebulizers and MDIs: cleaning and proper use, rinsing mouth with steroid doses and importance of monitoring MDI activations.          Pulmonary Rehab from 10/11/2015 in [facility] Cardiac and Pulmonary Rehab   Date  09/17/15   Educator  LB   Instruction Review Code  2- meets goals/outcomes      General Nutrition Guidelines/Fats and Fiber: -Group instruction provided by verbal, written material, models and posters to present the general guidelines for heart healthy nutrition. Gives an explanation and review of dietary fats and fiber.   Controlling Sodium/Reading Food Labels: -Group verbal and written material supporting the discussion of sodium use in heart healthy nutrition. Review and explanation with models, verbal and written materials for utilization of the food label.      Pulmonary Rehab from 10/11/2015 in [facility] Cardiac and Pulmonary Rehab   Date  09/30/15   Educator  CR   Instruction Review Code  2- meets goals/outcomes      Exercise Physiology & Risk Factors: - Group verbal and written instruction with models to review the exercise physiology of the cardiovascular system and associated critical values. Details cardiovascular disease risk factors and the goals associated with each risk factor.   Aerobic Exercise & Resistance Training: - Gives group verbal and written discussion on the health impact of inactivity. On the components of aerobic and resistive training programs and the benefits of this training and how to safely progress through these programs.   Flexibility, Balance, General Exercise Guidelines: - Provides group verbal and written instruction on the benefits of flexibility and balance training programs. Provides general exercise guidelines with specific guidelines to those with heart or lung disease. Demonstration and skill practice provided.  Stress  Management: - Provides group verbal and written instruction about the health risks of elevated stress, cause of high stress, and healthy ways to reduce stress.   Depression: - Provides group verbal and written instruction on the correlation between heart/lung disease and depressed mood, treatment options, and the stigmas associated with seeking treatment.   Exercise & Equipment Safety: - Individual verbal instruction and demonstration of equipment use and safety with use of the equipment.      Pulmonary Rehab from 10/11/2015 in [facility] Cardiac and Pulmonary Rehab   Date  09/17/15   Educator  LB   Instruction Review Code  2- meets goals/outcomes      Infection Prevention: - Provides verbal and written material to individual with discussion of infection control including proper hand washing and proper equipment cleaning during exercise session.      Pulmonary Rehab from 10/11/2015 in [facility] Cardiac and Pulmonary Rehab   Date  09/17/15   Educator  LB   Instruction Review Code  2- meets goals/outcomes      Falls Prevention: - Provides verbal and written material to individual with discussion of falls prevention and safety.      Pulmonary Rehab from 10/11/2015 in [facility] Cardiac and Pulmonary Rehab   Date  09/17/15   Educator  LB   Instruction Review Code  2- meets goals/outcomes      Diabetes: - Individual verbal and written instruction to review signs/symptoms of diabetes, desired ranges of glucose level fasting, after meals and with exercise. Advice that pre and post exercise glucose checks will be done for 3 sessions at entry of program.   Chronic Lung Diseases: - Group verbal and written instruction to review new updates, new respiratory medications, new advancements in procedures and treatments. Provide informative websites and "800" numbers of self-education.   Lung Procedures: - Group verbal and written instruction to describe testing methods done to diagnose lung disease. Review  the outcome of test results. Describe the treatment choices: Pulmonary Function Tests, ABGs and oximetry.   Energy Conservation: - Provide group verbal and written instruction for methods to conserve energy, plan and organize activities. Instruct on pacing techniques, use of adaptive equipment and posture/positioning to relieve shortness of breath.   Triggers: - Group verbal and written instruction to review types of environmental controls: home humidity, furnaces, filters, dust mite/pet prevention, HEPA vacuums. To discuss weather changes, air quality and the benefits of nasal washing.   Exacerbations: - Group verbal and written instruction to provide: warning signs, infection symptoms, calling MD promptly, preventive modes, and value of vaccinations. Review: effective airway clearance, coughing and/or vibration techniques. Create an Sports administrator.      Pulmonary Rehab from 10/11/2015 in [facility] Cardiac and Pulmonary Rehab   Date  10/07/15   Educator  LB   Instruction Review Code  2- meets goals/outcomes      Oxygen: - Individual and group verbal and written instruction on oxygen therapy. Includes supplement oxygen, available portable oxygen systems, continuous and intermittent flow rates, oxygen safety, concentrators, and Medicare reimbursement for oxygen.      Pulmonary Rehab from 10/11/2015 in [facility] Cardiac and Pulmonary Rehab   Date  09/17/15   Educator  LB   Instruction Review Code  2- meets goals/outcomes      Respiratory Medications: - Group verbal and written instruction to review medications for lung disease. Drug class, frequency, complications, importance of spacers, rinsing mouth after steroid MDI's, and proper cleaning methods for nebulizers.  Pulmonary Rehab from 10/11/2015 in [facility] Cardiac and Pulmonary Rehab   Date  09/17/15   Educator  LB   Instruction Review Code  2- meets goals/outcomes      AED/CPR: - Group verbal and written instruction with the use of models to  demonstrate the basic use of the AED with the basic ABC's of resuscitation.   Breathing Retraining: - Provides individuals verbal and written instruction on purpose, frequency, and proper technique of diaphragmatic breathing and pursed-lipped breathing. Applies individual practice skills.      Pulmonary Rehab from 10/11/2015 in [facility] Cardiac and Pulmonary Rehab   Date  09/17/15   Educator  LB   Instruction Review Code  2- meets goals/outcomes      Anatomy and Physiology of the Lungs: - Group verbal and written instruction with the use of models to provide basic lung anatomy and physiology related to function, structure and complications of lung disease.      Pulmonary Rehab from 10/11/2015 in [facility] Cardiac and Pulmonary Rehab   Date  09/27/15   Educator  [place]   Instruction Review Code  2- meets goals/outcomes      Heart Failure: - Group verbal and written instruction on the basics of heart failure: signs/symptoms, treatments, explanation of ejection fraction, enlarged heart and cardiomyopathy.      Pulmonary Rehab from 10/11/2015 in [facility] Cardiac and Pulmonary Rehab   Date  10/11/15 [facility] your numbers]   Educator  C. EnterkinRN   Instruction Review Code  2- meets goals/outcomes      Sleep Apnea: - Individual verbal and written instruction to review Obstructive Sleep Apnea. Review of risk factors, methods for diagnosing and types of masks and machines for OSA.   Anxiety: - Provides group, verbal and written instruction on the correlation between heart/lung disease and anxiety, treatment options, and management of anxiety.   Relaxation: - Provides group, verbal and written instruction about the benefits of relaxation for patients with heart/lung disease. Also provides patients with examples of relaxation techniques.   Knowledge Questionnaire Score:     Knowledge Questionnaire Score - 09/17/15 0900    Knowledge Questionnaire Score   Pre Score 7/10       Core Components/Risk  Factors/Patient Goals at Admission:     Personal Goals and Risk Factors at Admission - 09/17/15 0900    Core Components/Risk Factors/Patient Goals on Admission   Sedentary Yes  Ms Deckard's goal is to increase her walking. she may plan to walk in their pool this summer.   Intervention Provide advice, education, support and counseling about physical activity/exercise needs.;Develop an individualized exercise prescription for aerobic and resistive training based on initial evaluation findings, risk stratification, comorbidities and participant's personal goals.   Expected Outcomes Achievement of increased cardiorespiratory fitness and enhanced flexibility, muscular endurance and strength shown through measurements of functional capacity and personal statement of participant.   Increase Strength and Stamina Yes   Intervention Provide advice, education, support and counseling about physical activity/exercise needs.;Develop an individualized exercise prescription for aerobic and resistive training based on initial evaluation findings, risk stratification, comorbidities and participant's personal goals.   Expected Outcomes Achievement of increased cardiorespiratory fitness and enhanced flexibility, muscular endurance and strength shown through measurements of functional capacity and personal statement of participant.   Improve shortness of breath with ADL's Yes   Intervention Provide education, individualized exercise plan and daily activity instruction to help decrease symptoms of SOB with activities of daily living.   Expected Outcomes Short Term: Achieves a  reduction of symptoms when performing activities of daily living.   Develop more efficient breathing techniques such as purse lipped breathing and diaphragmatic breathing; and practicing self-pacing with activity Yes   Intervention Provide education, demonstration and support about specific breathing techniuqes utilized for more efficient breathing.  Include techniques such as pursed lipped breathing, diaphragmatic breathing and self-pacing activity.   Expected Outcomes Short Term: Participant will be able to demonstrate and use breathing techniques as needed throughout daily activities.   Increase knowledge of respiratory medications and ability to use respiratory devices properly  Yes  Uses oxygen for sleep and activity - 1l/m; she needs to be encouraged to use with activity on continuous flow; uses Advair, Spiriva, Albuterol MDIs and SVN with Albuterol.   Intervention Provide education and demonstration as needed of appropriate use of medications, inhalers, and oxygen therapy.   Expected Outcomes Short Term: Achieves understanding of medications use. Understands that oxygen is a medication prescribed by physician. Demonstrates appropriate use of inhaler and oxygen therapy.   Hypertension Yes   Intervention Provide education on lifestyle modifcations including regular physical activity/exercise, weight management, moderate sodium restriction and increased consumption of fresh fruit, vegetables, and low fat dairy, alcohol moderation, and smoking cessation.;Monitor prescription use compliance.   Expected Outcomes Short Term: Continued assessment and intervention until BP is < 140/48m HG in hypertensive participants. < 130/867mHG in hypertensive participants with diabetes, heart failure or chronic kidney disease.;Long Term: Maintenance of blood pressure at goal levels.      Core Components/Risk Factors/Patient Goals Review:      Goals and Risk Factor Review      09/23/15 1213 09/30/15 1000 10/07/15 1000 10/10/15 1318 10/10/15 1329   Core Components/Risk Factors/Patient Goals Review   Personal Goals Review Develop more efficient breathing techniques such as purse lipped breathing and diaphragmatic breathing and practicing self-pacing with activity. Increase knowledge of respiratory medications and ability to use respiratory devices properly.  Develop more efficient breathing techniques such as purse lipped breathing and diaphragmatic breathing and practicing self-pacing with activity.;Improve shortness of breath with ADL's Hypertension;Lipids Hypertension;Lipids   Review Pursed lip breathing techniques were discussed and demonstrated with patient. Patient demonstrated understanding of these techniques. Ms CaRobersonad a go day on the  treadmill and maintained her O2Sat's in the 90's which reassured and gave her more confidence in using the treadmill. she recognizes the importance of increasing her oxygen to 2l/m continuous and how this can help her with activity. Ms CaNicolltates she is not using her Albuterol Inhaler, only occasionally, instead she is using PLB. She is very aware of weather and her breathing and stays inside on "bad air" days. and does a good job managing her shortness of breath . ElEbelinontinues to maintain good BP control.  She continues to take her hypertension and cholesterol medications as ordered. ElDivaontinues to maintain good BP control.  She continues to take her hypertension and cholesterol medications as ordered.   Expected Outcomes Patient will independenly use pursed lip breathing techniques to help tolerate exercise.  Continue progressing with oxygen use with her exercise goals. Continue using PLB to manage her COPD. COntinued control of her risk factors through meds, exerse and nutrion plan. COntinued control of her risk factors through meds, exerse and nutrion plan.     10/14/15 1615           Core Components/Risk Factors/Patient Goals Review   Personal Goals Review Sedentary       Review Ms CaCahnlans to  continue her exercise after [facility]. Her husband is currently looking for a piece of exercise equipment they can use at home.       Expected Outcomes Continue exercising with exercise equipment at home.          Core Components/Risk Factors/Patient Goals at Discharge (Final Review):       Goals and Risk Factor Review - 10/14/15 1615    Core Components/Risk Factors/Patient Goals Review   Personal Goals Review Sedentary   Review Ms Lahmann plans to continue her exercise after [facility]. Her husband is currently looking for a piece of exercise equipment they can use at home.   Expected Outcomes Continue exercising with exercise equipment at home.      ITP Comments:   Comments: 30 Day Review

## 2015-10-23 ENCOUNTER — Encounter: Payer: PPO | Admitting: *Deleted

## 2015-10-23 DIAGNOSIS — J449 Chronic obstructive pulmonary disease, unspecified: Secondary | ICD-10-CM | POA: Diagnosis not present

## 2015-10-23 NOTE — Progress Notes (Signed)
Daily Session Note  Patient Details  Name: Joellyn Grandt MRN: [id number] Date of Birth: [birth year] Referring Provider:        Pulmonary Rehab from 09/17/2015 in [facility] and Pulmonary Rehab   Referring Provider  Clayborn Bigness      Encounter Date: 10/23/2015  Check In:     Session Check In - 10/23/15 1157    Check-In   Location [facility]-Cardiac & Pulmonary Rehab   Staff Present Alberteen Sam, MA, ACSM RCEP, Exercise Physiologist;Amanda Oletta Darter, BA, ACSM CEP, Exercise Physiologist;Laureen Janell Quiet, RRT, Respiratory Therapist   Supervising physician immediately available to respond to emergencies [facility] immediately available ER MD   Physician(s) Edd Fabian and Jimmye Norman   Medication changes reported     No   Fall or balance concerns reported    No   Warm-up and Cool-down Performed on first and last piece of equipment   Resistance Training Performed Yes   VAD Patient? No   Pain Assessment   Currently in Pain? No/denies   Multiple Pain Sites No         Goals Met:  Proper associated with RPD/PD & O2 Sat Independence with exercise equipment Using PLB without cueing & demonstrates good technique Exercise tolerated well Strength training completed today  Goals Unmet:  Not Applicable  Comments: Pt able to follow exercise prescription today without complaint.  Will continue to monitor for progression.    Dr. Emily Filbert is Medical Director for [place] and [facility].

## 2015-10-28 ENCOUNTER — Other Ambulatory Visit: Payer: Self-pay | Admitting: Family Medicine

## 2015-10-28 ENCOUNTER — Encounter: Payer: PPO | Admitting: *Deleted

## 2015-10-28 DIAGNOSIS — J449 Chronic obstructive pulmonary disease, unspecified: Secondary | ICD-10-CM

## 2015-10-28 NOTE — Progress Notes (Signed)
Daily Session Note  Patient Details  Name: Karter Haire MRN: [id number] Date of Birth: [birth year] Referring Provider:        Pulmonary Rehab from 09/17/2015 in [facility] and Pulmonary Rehab   Referring Provider  Clayborn Bigness      Encounter Date: 10/28/2015  Check In:     Session Check In - 10/28/15 1131    Check-In   Location [facility]-Cardiac & Pulmonary Rehab   Staff Present Carson Myrtle, BS, RRT, Respiratory Therapist;Kennadi Albany Amedeo Plenty, BS, ACSM CEP, Exercise Physiologist;Jessica Luan Pulling, MA, ACSM RCEP, Exercise Physiologist   Supervising physician immediately available to respond to emergencies [facility] immediately available ER MD   Physician(s) Joni Fears and Corky Downs   Medication changes reported     No   Fall or balance concerns reported    No   Warm-up and Cool-down Performed on first and last piece of equipment   Resistance Training Performed Yes   VAD Patient? No   Pain Assessment   Currently in Pain? No/denies   Multiple Pain Sites No         Goals Met:  Proper associated with RPD/PD & O2 Sat Independence with exercise equipment Exercise tolerated well Strength training completed today  Goals Unmet:  Not Applicable  Comments: Patient completed exercise prescription and all exercise goals during rehab session. The exercise was tolerated well and the patient is progressing in the program.     Dr. Emily Filbert is Medical Director for [place] and [facility].

## 2015-10-31 DIAGNOSIS — J441 Chronic obstructive pulmonary disease with (acute) exacerbation: Secondary | ICD-10-CM | POA: Diagnosis not present

## 2015-11-01 ENCOUNTER — Encounter: Payer: PPO | Admitting: *Deleted

## 2015-11-01 DIAGNOSIS — J449 Chronic obstructive pulmonary disease, unspecified: Secondary | ICD-10-CM

## 2015-11-01 NOTE — Progress Notes (Signed)
Daily Session Note  Patient Details  Name: Joyce Rodriguez MRN: [id number] Date of Birth: Aug 08, 1945 Referring Provider:        Pulmonary Rehab from 09/17/2015 in [facility] Cardiac and Pulmonary Rehab   Referring Provider  Clayborn Bigness      Encounter Date: 11/01/2015  Check In:     Session Check In - 11/01/15 1144    Check-In   Staff Present Gerlene Burdock, RN, BSN;Stacey Blanch Media, RRT, RCP, Respiratory Dareen Piano, BA, ACSM CEP, Exercise Physiologist   Supervising physician immediately available to respond to emergencies [facility] immediately available ER MD   Physician(s) Dr. Jeral Fruit and Dr. Cinda Quest   Medication changes reported     No   Fall or balance concerns reported    No   Warm-up and Cool-down Performed on first and last piece of equipment   Resistance Training Performed Yes   Pain Assessment   Currently in Pain? No/denies         Goals Met:  Proper associated with RPD/PD & O2 Sat Exercise tolerated well  Goals Unmet:  Not Applicable  Comments:     Dr. Emily Filbert is Medical Director for [place] and [facility].

## 2015-11-04 ENCOUNTER — Encounter: Payer: PPO | Admitting: *Deleted

## 2015-11-04 DIAGNOSIS — J449 Chronic obstructive pulmonary disease, unspecified: Secondary | ICD-10-CM

## 2015-11-04 NOTE — Progress Notes (Signed)
Daily Session Note  Patient Details  Name: Joyce Rodriguez MRN: [id number] Date of Birth: Feb 18, 1946 Referring Provider:   April Manson Pulmonary Rehab from 09/17/2015 in [facility] Cardiac and Pulmonary Rehab  Referring Provider  Clayborn Bigness      Encounter Date: 11/04/2015  Check In:     Session Check In - 11/04/15 1155      Check-In   Location [facility]-Cardiac & Pulmonary Rehab   Staff Present Alberteen Sam, MA, ACSM RCEP, Exercise Physiologist;Kelly Amedeo Plenty, BS, ACSM CEP, Exercise Physiologist;Laureen Owens Shark, BS, RRT, Respiratory Therapist   Supervising physician immediately available to respond to emergencies [facility] immediately available ER MD   Physician(s) Drs. Archie Balboa and [place]   Medication changes reported     No   Fall or balance concerns reported    No   Warm-up and Cool-down Performed on first and last piece of equipment   Resistance Training Performed Yes   VAD Patient? No     Pain Assessment   Currently in Pain? No/denies   Multiple Pain Sites No         Goals Met:  Proper associated with RPD/PD & O2 Sat Independence with exercise equipment Improved SOB with ADL's Using PLB without cueing & demonstrates good technique Exercise tolerated well Personal goals reviewed Strength training completed today  Goals Unmet:  Not Applicable  Comments: Pt able to follow exercise prescription today without complaint.  Will continue to monitor for progression. Reviewed home exercise with pt today.  Pt plans to walk in pool and at home for exercise.  She is planning to get a home gym and stationary bike to use as well.  Reviewed THR, pulse, RPE, sign and symptoms, pulse oximetry, and when to call 911 or MD.  Also discussed weather considerations and indoor options.  Pt voiced understanding. Alberteen Sam, MA, ACSM RCEP 11/04/2015 12:01 PM    Dr. Emily Filbert is Medical Director for [place] and [facility].

## 2015-11-07 ENCOUNTER — Ambulatory Visit (INDEPENDENT_AMBULATORY_CARE_PROVIDER_SITE_OTHER): Payer: PPO | Admitting: Family Medicine

## 2015-11-07 ENCOUNTER — Encounter: Payer: Self-pay | Admitting: Family Medicine

## 2015-11-07 VITALS — BP 135/80 | HR 87 | Temp 97.8°F | Ht 62.5 in | Wt 139.0 lb

## 2015-11-07 DIAGNOSIS — I10 Essential (primary) hypertension: Secondary | ICD-10-CM | POA: Diagnosis not present

## 2015-11-07 DIAGNOSIS — M19042 Primary osteoarthritis, left hand: Secondary | ICD-10-CM | POA: Diagnosis not present

## 2015-11-07 DIAGNOSIS — E785 Hyperlipidemia, unspecified: Secondary | ICD-10-CM | POA: Diagnosis not present

## 2015-11-07 DIAGNOSIS — Z23 Encounter for immunization: Secondary | ICD-10-CM | POA: Diagnosis not present

## 2015-11-07 DIAGNOSIS — F329 Major depressive disorder, single episode, unspecified: Secondary | ICD-10-CM | POA: Diagnosis not present

## 2015-11-07 DIAGNOSIS — J410 Simple chronic bronchitis: Secondary | ICD-10-CM | POA: Diagnosis not present

## 2015-11-07 DIAGNOSIS — M19041 Primary osteoarthritis, right hand: Secondary | ICD-10-CM

## 2015-11-07 LAB — LP+ALT+AST PICCOLO, WAIVED
ALT (SGPT) Piccolo, Waived: 18 U/L (ref 10–47)
AST (SGOT) PICCOLO, WAIVED: 33 U/L (ref 11–38)
CHOL/HDL RATIO PICCOLO,WAIVE: 2.1 mg/dL
CHOLESTEROL PICCOLO, WAIVED: 165 mg/dL (ref <200)
HDL Chol Piccolo, Waived: 80 mg/dL (ref >59)
LDL Chol Calc Piccolo Waived: 66 mg/dL (ref <100)
TRIGLYCERIDES PICCOLO,WAIVED: 94 mg/dL (ref <150)
VLDL Chol Calc Piccolo,Waive: 19 mg/dL (ref <30)

## 2015-11-07 MED ORDER — PANTOPRAZOLE SODIUM 40 MG PO TBEC: 40.0000 mg | DELAYED_RELEASE_TABLET | Freq: Every day | ORAL | 2 refills | Status: DC

## 2015-11-07 MED ORDER — ROSUVASTATIN CALCIUM 20 MG PO TABS: 20.0000 mg | ORAL_TABLET | Freq: Every day | ORAL | 2 refills | Status: DC

## 2015-11-07 MED ORDER — LOSARTAN POTASSIUM 100 MG PO TABS: 100.0000 mg | ORAL_TABLET | Freq: Every day | ORAL | 2 refills | Status: DC

## 2015-11-07 MED ORDER — RALOXIFENE HCL 60 MG PO TABS: 60.0000 mg | ORAL_TABLET | Freq: Every day | ORAL | 2 refills | Status: DC

## 2015-11-07 MED ORDER — TRAMADOL HCL 50 MG PO TABS: ORAL_TABLET | ORAL | 0 refills | Status: DC

## 2015-11-07 MED ORDER — ESCITALOPRAM OXALATE 20 MG PO TABS: 20.0000 mg | ORAL_TABLET | Freq: Every day | ORAL | 2 refills | Status: DC

## 2015-11-07 NOTE — Assessment & Plan Note (Signed)
The current medical regimen is effective;  continue present plan and medications.  

## 2015-11-07 NOTE — Patient Instructions (Addendum)
Tdap Vaccine (Tetanus, Diphtheria and Pertussis): What You Need to Know 1. Why get vaccinated? Tetanus, diphtheria and pertussis are very serious diseases. Tdap vaccine can protect us from these diseases. And, Tdap vaccine given to pregnant women can protect newborn babies against pertussis. TETANUS (Lockjaw) is rare in the [place] today. It causes painful muscle tightening and stiffness, usually all over the body.  It can lead to tightening of muscles in the head and neck so you can't open your mouth, swallow, or sometimes even breathe. Tetanus kills about 1 out of 10 people who are infected even after receiving the best medical care. DIPHTHERIA is also rare in the [place] today. It can cause a thick coating to form in the back of the throat.  It can lead to breathing problems, heart failure, paralysis, and death. PERTUSSIS (Whooping Cough) causes severe coughing spells, which can cause difficulty breathing, vomiting and disturbed sleep.  It can also lead to weight loss, incontinence, and rib fractures. Up to 2 in 100 adolescents and 5 in 100 adults with pertussis are hospitalized or have complications, which could include pneumonia or death. These diseases are caused by bacteria. Diphtheria and pertussis are spread from person to person through secretions from coughing or sneezing. Tetanus enters the body through cuts, scratches, or wounds. Before vaccines, as many as 200,000 cases of diphtheria, 200,000 cases of pertussis, and hundreds of cases of tetanus, were reported in the [place] each year. Since vaccination began, reports of cases for tetanus and diphtheria have dropped by about 99% and for pertussis by about 80%. 2. Tdap vaccine Tdap vaccine can protect adolescents and adults from tetanus, diphtheria, and pertussis. One dose of Tdap is routinely given at age 11 or 12. People who did not get Tdap at that age should get it as soon as possible. Tdap is especially important  for healthcare professionals and anyone having close contact with a baby younger than 12 months. Pregnant women should get a dose of Tdap during every pregnancy, to protect the newborn from pertussis. Infants are most at risk for severe, life-threatening complications from pertussis. Another vaccine, called Td, protects against tetanus and diphtheria, but not pertussis. A Td booster should be given every 10 years. Tdap may be given as one of these boosters if you have never gotten Tdap before. Tdap may also be given after a severe cut or burn to prevent tetanus infection. Your doctor or the person giving you the vaccine can give you more information. Tdap may safely be given at the same time as other vaccines. 3. Some people should not get this vaccine  A person who has ever had a life-threatening allergic reaction after a previous dose of any diphtheria, tetanus or pertussis containing vaccine, OR has a severe allergy to any part of this vaccine, should not get Tdap vaccine. Tell the person giving the vaccine about any severe allergies.  Anyone who had coma or long repeated seizures within 7 days after a childhood dose of DTP or DTaP, or a previous dose of Tdap, should not get Tdap, unless a cause other than the vaccine was found. They can still get Td.  Talk to your doctor if you:  have seizures or another nervous system problem,  had severe pain or swelling after any vaccine containing diphtheria, tetanus or pertussis,  ever had a condition called Guillain-Barr Syndrome (GBS),  aren't feeling well on the day the shot is scheduled. 4. Risks With any medicine, including vaccines, there is   a chance of side effects. These are usually mild and go away on their own. Serious reactions are also possible but are rare. Most people who get Tdap vaccine do not have any problems with it. Mild problems following Tdap (Did not interfere with activities)  Pain where the shot was given (about 3 in 4  adolescents or 2 in 3 adults)  Redness or swelling where the shot was given (about 1 person in 5)  Mild fever of at least 100.4F (up to about 1 in 25 adolescents or 1 in 100 adults)  Headache (about 3 or 4 people in 10)  Tiredness (about 1 person in 3 or 4)  Nausea, vomiting, diarrhea, stomach ache (up to 1 in 4 adolescents or 1 in 10 adults)  Chills, sore joints (about 1 person in 10)  Body aches (about 1 person in 3 or 4)  Rash, swollen glands (uncommon) Moderate problems following Tdap (Interfered with activities, but did not require medical attention)  Pain where the shot was given (up to 1 in 5 or 6)  Redness or swelling where the shot was given (up to about 1 in 16 adolescents or 1 in 12 adults)  Fever over 102F (about 1 in 100 adolescents or 1 in 250 adults)  Headache (about 1 in 7 adolescents or 1 in 10 adults)  Nausea, vomiting, diarrhea, stomach ache (up to 1 or 3 people in 100)  Swelling of the entire arm where the shot was given (up to about 1 in 500). Severe problems following Tdap (Unable to perform usual activities; required medical attention)  Swelling, severe pain, bleeding and redness in the arm where the shot was given (rare). Problems that could happen after any vaccine:  People sometimes faint after a medical procedure, including vaccination. Sitting or lying down for about 15 minutes can help prevent fainting, and injuries caused by a fall. Tell your doctor if you feel dizzy, or have vision changes or ringing in the ears.  Some people get severe pain in the shoulder and have difficulty moving the arm where a shot was given. This happens very rarely.  Any medication can cause a severe allergic reaction. Such reactions from a vaccine are very rare, estimated at fewer than 1 in a million doses, and would happen within a few minutes to a few hours after the vaccination. As with any medicine, there is a very remote chance of a vaccine causing a serious  injury or death. The safety of vaccines is always being monitored. For more information, visit: www.cdc.gov/vaccinesafety/ 5. What if there is a serious problem? What should I look for?  Look for anything that concerns you, such as signs of a severe allergic reaction, very high fever, or unusual behavior.  Signs of a severe allergic reaction can include hives, swelling of the face and throat, difficulty breathing, a fast heartbeat, dizziness, and weakness. These would usually start a few minutes to a few hours after the vaccination. What should I do?  If you think it is a severe allergic reaction or other emergency that can't wait, call 9-1-1 or get the person to the nearest hospital. Otherwise, call your doctor.  Afterward, the reaction should be reported to the Vaccine Adverse Event Reporting System (VAERS). Your doctor might file this report, or you can do it yourself through the VAERS web site at www.vaers.hhs.gov, or by calling 1-800-822-7967. VAERS does not give medical advice.  6. The National Vaccine Injury Compensation Program The National Vaccine Injury Compensation Program (  VICP) is a federal program that was created to compensate people who may have been injured by certain vaccines. Persons who believe they may have been injured by a vaccine can learn about the program and about filing a claim by calling 1-800-338-2382 or visiting the VICP website at www.hrsa.gov/vaccinecompensation. There is a time limit to file a claim for compensation. 7. How can I learn more?  Ask your doctor. He or she can give you the vaccine package insert or suggest other sources of information.  Call your local or state health department.  Contact the Centers for Disease Control and Prevention (CDC):  Call 1-800-232-4636 (1-800-CDC-INFO) or  Visit CDC's website at www.cdc.gov/vaccines CDC Tdap Vaccine VIS (06/06/13)   This information is not intended to replace advice given to you by your health care  provider. Make sure you discuss any questions you have with your health care provider.   Document Released: 09/29/2011 Document Revised: 04/20/2014 Document Reviewed: 07/12/2013 Elsevier Interactive Patient Education 2016 Elsevier Inc.  

## 2015-11-07 NOTE — Progress Notes (Signed)
BP 135/80 (BP Location: Left Arm, Patient Position: Sitting, Cuff Size: Small)   Pulse 87   Temp 97.8 F (36.6 C)   Ht 5' 2.5" (1.588 m)   Wt 139 lb (63 kg)   SpO2 95%   BMI 25.02 kg/m    Subjective:    Patient ID: Joyce Rodriguez, female    DOB: 09/23/1945, 70 y.o.   MRN: [id number]  HPI: Joyce Rodriguez is a 70 y.o. female  Chief Complaint  Patient presents with  . Hypertension  . Hyperlipidemia  . Depression    Patient all in all doing doing really well COPD getting pulmonary rehabilitation which is helped a tremendous amount. Patient's now more active. With better lifestyle blood pressures dropped had to reduce losartan from 100-50 mg and doing well with good blood pressure control Depression cholesterol all stable on current medications  Relevant past medical, surgical, family and social history reviewed and updated as indicated. Interim medical history since our last visit reviewed. Allergies and medications reviewed and updated.  Review of Systems  Constitutional: Negative.   Respiratory: Negative.   Cardiovascular: Negative.     Per HPI unless specifically indicated above     Objective:    BP 135/80 (BP Location: Left Arm, Patient Position: Sitting, Cuff Size: Small)   Pulse 87   Temp 97.8 F (36.6 C)   Ht 5' 2.5" (1.588 m)   Wt 139 lb (63 kg)   SpO2 95%   BMI 25.02 kg/m   Wt Readings from Last 3 Encounters:  11/07/15 139 lb (63 kg)  09/17/15 142 lb (64.4 kg)  04/16/15 136 lb 9.6 oz (62 kg)    Physical Exam  Constitutional: She is oriented to person, place, and time. She appears well-developed and well-nourished. No distress.  HENT:  Head: Normocephalic and atraumatic.  Right Ear: Hearing normal.  Left Ear: Hearing normal.  Nose: Nose normal.  Eyes: Conjunctivae and lids are normal. Right eye exhibits no discharge. Left eye exhibits no discharge. No scleral icterus.  Cardiovascular: Normal rate, regular rhythm and normal heart sounds.     Pulmonary/Chest: Effort normal. No respiratory distress. She has wheezes. She has no rales.  Musculoskeletal: Normal range of motion.  Patient with some left neck shoulder discomfort trigger point identified left trapezius area recommended massage therapy  Neurological: She is alert and oriented to person, place, and time.  Skin: Skin is intact. No rash noted.  Psychiatric: She has a normal mood and affect. Her speech is normal and behavior is normal. Judgment and thought content normal. Cognition and memory are normal.    Results for orders placed or performed in visit on [id number]  Basic metabolic panel  Result Value Ref Range   Glucose 88 65 - 99 mg/dL   BUN 14 8 - 27 mg/dL   Creatinine, Ser 0.83 0.57 - 1.00 mg/dL   GFR calc non Af Amer 73 >59 mL/min/1.73   GFR calc Af Amer 84 >59 mL/min/1.73   BUN/Creatinine Ratio 17 11 - 26   Sodium 142 134 - 144 mmol/L   Potassium 4.6 3.5 - 5.2 mmol/L   Chloride 99 96 - 106 mmol/L   CO2 26 18 - 29 mmol/L   Calcium 9.7 8.7 - 10.3 mg/dL      Assessment & Plan:   Problem List Items Addressed This Visit      Cardiovascular and Mediastinum   Essential hypertension - Primary    The current medical regimen is effective;  continue present plan  and medications.       Relevant Medications   rosuvastatin (CRESTOR) 20 MG tablet   losartan (COZAAR) 100 MG tablet   Other Relevant Orders   LP+ALT+AST Piccolo, Waived   Basic metabolic panel     Respiratory   COPD (chronic obstructive pulmonary disease) (HCC)    The current medical regimen is effective;  continue present plan and medications.         Musculoskeletal and Integument   Osteoarthritis of both hands   Relevant Medications   traMADol (ULTRAM) 50 MG tablet     Other   Major depression, chronic (HCC) (Chronic)    The current medical regimen is effective;  continue present plan and medications.       Relevant Medications   escitalopram (LEXAPRO) 20 MG tablet   Hyperlipidemia     The current medical regimen is effective;  continue present plan and medications.       Relevant Medications   rosuvastatin (CRESTOR) 20 MG tablet   losartan (COZAAR) 100 MG tablet   Other Relevant Orders   LP+ALT+AST Piccolo, Waived   Basic metabolic panel    Other Visit Diagnoses    Need for Tdap vaccination       Relevant Orders   Tdap vaccine greater than or equal to 7yo IM (Completed)       Follow up plan: Return in about 6 months (around 05/09/2016) for Physical Exam.

## 2015-11-08 ENCOUNTER — Encounter: Payer: PPO | Admitting: *Deleted

## 2015-11-08 DIAGNOSIS — J449 Chronic obstructive pulmonary disease, unspecified: Secondary | ICD-10-CM

## 2015-11-08 LAB — BASIC METABOLIC PANEL
BUN / CREAT RATIO: 13 (ref 12–28)
BUN: 10 mg/dL (ref 8–27)
CHLORIDE: 97 mmol/L (ref 96–106)
CO2: 27 mmol/L (ref 18–29)
Calcium: 10.1 mg/dL (ref 8.7–10.3)
Creatinine, Ser: 0.8 mg/dL (ref 0.57–1.00)
GFR calc non Af Amer: 76 mL/min/{1.73_m2} (ref >59)
GFR, EST AFRICAN AMERICAN: 88 mL/min/{1.73_m2} (ref >59)
Glucose: 80 mg/dL (ref 65–99)
POTASSIUM: 4.4 mmol/L (ref 3.5–5.2)
Sodium: 140 mmol/L (ref 134–144)

## 2015-11-08 NOTE — Progress Notes (Signed)
Daily Session Note  Patient Details  Name: Joyce Rodriguez MRN: [id number] Date of Birth: 11/13/1945 Referring Provider:   Flowsheet Row Pulmonary Rehab from 09/17/2015 in [facility] Cardiac and Pulmonary Rehab  Referring Provider  Clayborn Bigness      Encounter Date: 11/08/2015  Check In:     Session Check In - 11/08/15 1052      Check-In   Location [facility]-Cardiac & Pulmonary Rehab   Staff Present Frederich Cha, RRT, RCP, Respiratory Therapist;Draya Felker, RN, BSN;Mary Kellie Shropshire, RN, BSN, MA   Supervising physician immediately available to respond to emergencies [facility] immediately available ER MD   Physician(s) Dr. Claudette Head and Dr. Edd Fabian   Medication changes reported     No   Fall or balance concerns reported    No   Warm-up and Cool-down Performed on first and last piece of equipment   VAD Patient? No     Pain Assessment   Currently in Pain? No/denies         Goals Met:  Proper associated with RPD/PD & O2 Sat Exercise tolerated well  Goals Unmet:  Not Applicable  Comments:     Dr. Emily Filbert is Medical Director for [facility] and [facility].

## 2015-11-11 ENCOUNTER — Encounter: Payer: PPO | Admitting: *Deleted

## 2015-11-11 ENCOUNTER — Encounter: Payer: Self-pay | Admitting: Family Medicine

## 2015-11-11 DIAGNOSIS — J449 Chronic obstructive pulmonary disease, unspecified: Secondary | ICD-10-CM | POA: Diagnosis not present

## 2015-11-11 NOTE — Progress Notes (Signed)
Daily Session Note  Patient Details  Name: Joyce Rodriguez MRN: [id number] Date of Birth: Dec 05, 1945 Referring Provider:   Flowsheet Row Pulmonary Rehab from 09/17/2015 in [facility] Cardiac and Pulmonary Rehab  Referring Provider  Clayborn Bigness      Encounter Date: 11/11/2015  Check In:     Session Check In - 11/11/15 1009      Check-In   Location [facility]-Cardiac & Pulmonary Rehab   Staff Present Carson Myrtle, BS, RRT, Respiratory Therapist;Tulio Facundo Amedeo Plenty, BS, ACSM CEP, Exercise Physiologist;Jessica Luan Pulling, MA, ACSM RCEP, Exercise Physiologist   Supervising physician immediately available to respond to emergencies [facility] immediately available ER MD   Physician(s) Kerman Passey and Quale   Medication changes reported     No   Fall or balance concerns reported    No   Warm-up and Cool-down Performed on first and last piece of equipment   Resistance Training Performed Yes   VAD Patient? No     Pain Assessment   Currently in Pain? No/denies   Multiple Pain Sites No         Goals Met:  Proper associated with RPD/PD & O2 Sat Independence with exercise equipment Exercise tolerated well Strength training completed today  Goals Unmet:  Not Applicable  Comments: Patient completed exercise prescription and all exercise goals during rehab session. The exercise was tolerated well and the patient is progressing in the program.     Dr. Emily Filbert is Medical Director for [facility] and [facility].

## 2015-11-13 ENCOUNTER — Encounter: Payer: PPO | Attending: Internal Medicine

## 2015-11-13 DIAGNOSIS — H353132 Nonexudative age-related macular degeneration, bilateral, intermediate dry stage: Secondary | ICD-10-CM | POA: Diagnosis not present

## 2015-11-13 DIAGNOSIS — H35342 Macular cyst, hole, or pseudohole, left eye: Secondary | ICD-10-CM | POA: Diagnosis not present

## 2015-11-13 DIAGNOSIS — J449 Chronic obstructive pulmonary disease, unspecified: Secondary | ICD-10-CM | POA: Insufficient documentation

## 2015-11-13 DIAGNOSIS — H43813 Vitreous degeneration, bilateral: Secondary | ICD-10-CM | POA: Diagnosis not present

## 2015-11-13 DIAGNOSIS — H35372 Puckering of macula, left eye: Secondary | ICD-10-CM | POA: Diagnosis not present

## 2015-11-14 DIAGNOSIS — J9611 Chronic respiratory failure with hypoxia: Secondary | ICD-10-CM | POA: Diagnosis not present

## 2015-11-14 DIAGNOSIS — J449 Chronic obstructive pulmonary disease, unspecified: Secondary | ICD-10-CM | POA: Diagnosis not present

## 2015-11-15 ENCOUNTER — Encounter: Payer: PPO | Admitting: *Deleted

## 2015-11-15 DIAGNOSIS — J449 Chronic obstructive pulmonary disease, unspecified: Secondary | ICD-10-CM

## 2015-11-15 NOTE — Progress Notes (Signed)
Daily Session Note  Patient Details  Name: Joyce Rodriguez MRN: [id number] Date of Birth: 10/04/1945 Referring Provider:   Flowsheet Row Pulmonary Rehab from 09/17/2015 in [facility] Cardiac and Pulmonary Rehab  Referring Provider  Khan, fozia      Encounter Date: 11/15/2015  Check In:     Session Check In - 11/15/15 1158      Check-In   Location [facility]-Cardiac & Pulmonary Rehab   Staff Present Susanne Bice, RN, BSN, CCRP;Carroll Enterkin, RN, BSN;Laureen Brown, BS, RRT, Respiratory Therapist   Supervising physician immediately available to respond to emergencies [facility] immediately available ER MD   Physician(s) Drs: Paduchowski and Yao   Medication changes reported     No   Fall or balance concerns reported    No   Warm-up and Cool-down Performed on first and last piece of equipment   Resistance Training Performed Yes   VAD Patient? No     Pain Assessment   Currently in Pain? No/denies         Goals Met:  Exercise tolerated well Strength training completed today  Goals Unmet:  Not Applicable  Comments: Doing well with exercise prescription progression.    Dr. Mark Miller is Medical Director for [facility] and [facility]. 

## 2015-11-18 ENCOUNTER — Encounter: Payer: PPO | Admitting: *Deleted

## 2015-11-18 ENCOUNTER — Encounter: Payer: Self-pay | Admitting: *Deleted

## 2015-11-18 DIAGNOSIS — J449 Chronic obstructive pulmonary disease, unspecified: Secondary | ICD-10-CM | POA: Diagnosis not present

## 2015-11-18 NOTE — Progress Notes (Signed)
Pulmonary Individual Treatment Plan  Patient Details  Name: Joyce Rodriguez MRN: [id number] Date of Birth: February 12, 1946 Referring Provider:   Flowsheet Row Pulmonary Rehab from 09/17/2015 in [facility] Cardiac and Pulmonary Rehab  Referring Provider  Clayborn Bigness      Initial Encounter Date:  Flowsheet Row Pulmonary Rehab from 09/17/2015 in [facility] Cardiac and Pulmonary Rehab  Date  09/17/15  Referring Provider  Clayborn Bigness      Visit Diagnosis: COPD, moderate ([place])  Patient's Home Medications on Admission:  Current Outpatient Prescriptions:  .  albuterol (PROVENTIL HFA;VENTOLIN HFA) 108 (90 BASE) MCG/ACT inhaler, Inhale 2 puffs into the lungs every 6 (six) hours as needed for shortness of breath., Disp: , Rfl:  .  aspirin EC 81 MG tablet, Take 81 mg by mouth daily., Disp: , Rfl:  .  escitalopram (LEXAPRO) 20 MG tablet, Take 1 tablet (20 mg total) by mouth daily., Disp: 90 tablet, Rfl: 2 .  Fluticasone-Salmeterol (ADVAIR) 250-50 MCG/DOSE AEPB, Inhale 1 puff into the lungs 2 (two) times daily., Disp: , Rfl:  .  losartan (COZAAR) 100 MG tablet, Take 1 tablet (100 mg total) by mouth daily., Disp: 90 tablet, Rfl: 2 .  pantoprazole (PROTONIX) 40 MG tablet, Take 1 tablet (40 mg total) by mouth daily., Disp: 90 tablet, Rfl: 2 .  raloxifene (EVISTA) 60 MG tablet, Take 1 tablet (60 mg total) by mouth daily., Disp: 90 tablet, Rfl: 2 .  rosuvastatin (CRESTOR) 20 MG tablet, Take 1 tablet (20 mg total) by mouth daily., Disp: 90 tablet, Rfl: 2 .  tiotropium (SPIRIVA) 18 MCG inhalation capsule, Place 18 mcg into inhaler and inhale daily., Disp: , Rfl:  .  traMADol (ULTRAM) 50 MG tablet, 1-2 tabs q6 hours as needed for pain, Disp: 100 tablet, Rfl: 0  Past Medical History: Past Medical History:  Diagnosis Date  . COPD (chronic obstructive pulmonary disease) ([place])   . Hyperlipidemia   . Osteoporosis     Tobacco Use: History  Smoking Status  . Former Smoker  . Types: Cigarettes  . Quit date: 03/18/2001   Smokeless Tobacco  . Never Used    Labs: Recent Review Flowsheet Data    Labs for ITP Cardiac and Pulmonary Rehab Latest Ref Rng & Units 03/13/2014 09/12/2014 03/19/2015 11/07/2015   Cholestrol <200 mg/dL 269(A) 168 179 165   LDLCALC 0 - 99 mg/dL 151 67 76 -   HDL >39 mg/dL 89(A) 84 84 -   Trlycerides 0 - 149 mg/dL 144 86 96 -       ADL UCSD:     Pulmonary Assessment Scores    Row Name 09/17/15 0900 11/08/15 1051       ADL UCSD   ADL Phase Entry Mid    SOB Score total 27 44    Rest 0 0    Walk 2 3    Stairs 3 4    Bath 2 1    Dress 1 2    Shop 2 2       Pulmonary Function Assessment:     Pulmonary Function Assessment - 09/17/15 0900      Pulmonary Function Tests   RV% 167 %   DLCO% 28 %     Initial Spirometry Results   FVC% 61 %   FEV1% 36 %   FEV1/FVC Ratio 46     Post Bronchodilator Spirometry Results   FVC% 68 %   FEV1% 37 %   FEV1/FVC Ratio 43     Breath  Bilateral Breath Sounds Decreased;Clear   Shortness of Breath Yes;Fear of Shortness of Breath;Limiting activity      Exercise Target Goals:    Exercise Program Goal: Individual exercise prescription set with THRR, safety & activity barriers. Participant demonstrates ability to understand and report RPE using BORG scale, to self-measure pulse accurately, and to acknowledge the importance of the exercise prescription.  Exercise Prescription Goal: Starting with aerobic activity 30 plus minutes a day, 3 days per week for initial exercise prescription. Provide home exercise prescription and guidelines that participant acknowledges understanding prior to discharge.  Activity Barriers & Risk Stratification:     Activity Barriers & Cardiac Risk Stratification - 09/17/15 0900      Activity Barriers & Cardiac Risk Stratification   Activity Barriers Shortness of Breath;Deconditioning;Muscular Weakness   Cardiac Risk Stratification Moderate      6 Minute Walk:     6 Minute Walk    Row Name  09/17/15 1536 11/08/15 1051       6 Minute Walk   Phase  - Mid Program    Distance 858 feet 940 feet    Distance % Change  - 9.5 %    Walk Time 6 minutes 6 minutes    # of Rest Breaks 0 0    MPH 1.63 1.78    METS 2.48 2.46    RPE 13 13    Perceived Dyspnea   - 3    VO2 Peak 8.7 8.6    Symptoms No No    Resting HR 100 bpm 88 bpm    Resting BP 118/66 122/70    Max Ex. HR 120 bpm 120 bpm    Max Ex. BP 132/68 110/76    2 Minute Post BP  - 100/70      Interval HR   Baseline HR 100 88    1 Minute HR 110 106    2 Minute HR 114 112    3 Minute HR 116 110    4 Minute HR 118 110    5 Minute HR 120 119    6 Minute HR 118 120    2 Minute Post HR 106 103    Interval Heart Rate? Yes Yes      Interval Oxygen   Interval Oxygen? Yes Yes    Baseline Oxygen Saturation % 92 % 93 %    Baseline Liters of Oxygen 1 L 0 L  Room Air    1 Minute Oxygen Saturation % 95 % 93 %    1 Minute Liters of Oxygen 1 L 0 L    2 Minute Oxygen Saturation % 93 % 87 %    2 Minute Liters of Oxygen 1 L 0 L    3 Minute Oxygen Saturation % 91 % 84 %    3 Minute Liters of Oxygen 1 L 0 L    4 Minute Oxygen Saturation % 88 % 82 %    4 Minute Liters of Oxygen 1 L 0 L    5 Minute Oxygen Saturation % 88 % 84 %    5 Minute Liters of Oxygen 1 L 2 L    6 Minute Oxygen Saturation % 88 % 84 %    6 Minute Liters of Oxygen 1 L 2 L    2 Minute Post Oxygen Saturation % 94 % 97 %    2 Minute Post Liters of Oxygen 1 L 2 L       Initial Exercise Prescription:  Initial Exercise Prescription - 09/17/15 1500      Date of Initial Exercise RX and Referring Provider   Date 09/17/15   Referring Provider Clayborn Bigness     Oxygen   Oxygen Continuous   Liters 1     Treadmill   MPH 1.5   Grade 0   Minutes 15   METs 2.4     NuStep   Level 3   Watts 30   Minutes 15   METs 2.4     Recumbant Elliptical   Level 1   RPM 50   Watts 30   Minutes 15   METs 2.4     Biostep-RELP   Level 3   Watts 30   Minutes 15    METs 2.4     Prescription Details   Frequency (times per week) 3-5   Duration Progress to 45 minutes of aerobic exercise without signs/symptoms of physical distress     Intensity   THRR 40-80% of Max Heartrate 120-141   Ratings of Perceived Exertion 11-15   Perceived Dyspnea 0-4     Progression   Progression Continue to progress workloads to maintain intensity without signs/symptoms of physical distress.     Resistance Training   Training Prescription Yes   Weight 2   Reps 10-12      Perform Capillary Blood Glucose checks as needed.  Exercise Prescription Changes:     Exercise Prescription Changes    Row Name 09/23/15 1200 10/01/15 1500 10/16/15 1500 10/30/15 1000 11/08/15 1000     Exercise Review   Progression  - Yes Yes Yes Yes     Response to Exercise   Blood Pressure (Admit)  - 124/80 120/68 110/64  -   Blood Pressure (Exercise)  - 160/84 148/82 120/66  -   Blood Pressure (Exit)  - 102/60 100/70 114/64  -   Heart Rate (Admit)  - 94 bpm 92 bpm 86 bpm  -   Heart Rate (Exercise)  - 118 bpm 120 bpm 119 bpm  -   Heart Rate (Exit)  - 102 bpm 103 bpm 103 bpm  -   Oxygen Saturation (Admit)  - 95 % 91 % 92 %  -   Oxygen Saturation (Exercise)  - 88 % 89 % 94 %  -   Oxygen Saturation (Exit)  - 96 % 97 % 96 %  -   Rating of Perceived Exertion (Exercise)  - 13 11 11   -   Perceived Dyspnea (Exercise)  - 3 3 3   -   Symptoms  - shortness of breath on treadmill  - shortness of breath on treadmill shortness of breath on treadmill   Comments  -  -  -  - Home Exercise Guidelines given 11/04/15   Duration  - Progress to 45 minutes of aerobic exercise without signs/symptoms of physical distress Progress to 45 minutes of aerobic exercise without signs/symptoms of physical distress Progress to 45 minutes of aerobic exercise without signs/symptoms of physical distress Progress to 45 minutes of aerobic exercise without signs/symptoms of physical distress   Intensity  - THRR unchanged THRR  unchanged THRR unchanged THRR unchanged     Progression   Progression  - Continue to progress workloads to maintain intensity without signs/symptoms of physical distress. Continue to progress workloads to maintain intensity without signs/symptoms of physical distress. Continue to progress workloads to maintain intensity without signs/symptoms of physical distress. Continue to progress workloads to maintain intensity without signs/symptoms of physical distress.  Average METs  - 2.1 2.8 2.7 2.7     Resistance Training   Training Prescription Yes Yes Yes Yes Yes   Weight 2 2 2 2 2    Reps 10-12 10-12 10-12 10-12 10-12     Interval Training   Interval Training  - No No No No     Oxygen   Oxygen Continuous Continuous Continuous Continuous Continuous   Liters 1 2 2 2 2      Treadmill   MPH 1.2 1.2 1.4 1.4 1.4   Grade 0 0 0 0.5 0.5   Minutes 15 15 15 15 15    METs 2.4 1.92 2.07 2.17 2.17     NuStep   Level 3 3 4 5 5    Watts 25 27 36  -  -   Minutes 15 15 15 15 15    METs 2.4 2 2.7 2.9 2.9     Recumbant Elliptical   Level 1  omitted today due to time  -  -  -  -   RPM 50  -  -  -  -   Watts 30  -  -  -  -   Minutes 15  -  -  -  -   METs 2.4  -  -  -  -     Biostep-RELP   Level 3 3 3 3 3    Watts 20 23 30   -  -   Minutes 8 15 15 15 15    METs 2.4 2 2.5 3 3      Home Exercise Plan   Plans to continue exercise at  -  - Home  walking in pool at home Home  walking in pool at home Home  walking in pool at home   Frequency  -  - Add 2 additional days to program exercise sessions. Add 2 additional days to program exercise sessions. Add 2 additional days to program exercise sessions.   [place] Name 11/13/15 1500             Exercise Review   Progression Yes         Response to Exercise   Blood Pressure (Admit) 118/64       Blood Pressure (Exercise) 140/80       Blood Pressure (Exit) 124/70       Heart Rate (Admit) 88 bpm       Heart Rate (Exercise) 134 bpm       Heart Rate  (Exit) 108 bpm       Oxygen Saturation (Admit) 93 %       Oxygen Saturation (Exercise) 92 %       Oxygen Saturation (Exit) 95 %       Rating of Perceived Exertion (Exercise) 15       Perceived Dyspnea (Exercise) 3       Symptoms shortness of breath on treadmill       Comments Home Exercise Guidelines given 11/04/15       Duration Progress to 45 minutes of aerobic exercise without signs/symptoms of physical distress       Intensity THRR unchanged         Progression   Progression Continue to progress workloads to maintain intensity without signs/symptoms of physical distress.       Average METs 2.62         Resistance Training   Training Prescription Yes       Weight 2       Reps 10-12  Interval Training   Interval Training No         Oxygen   Oxygen Continuous       Liters 2         Treadmill   MPH 1.4       Grade 0.5       Minutes 15       METs 2.17         NuStep   Level 5       Minutes 15       METs 2.7         Biostep-RELP   Level 4       Minutes 15       METs 3         Home Exercise Plan   Plans to continue exercise at Home  walking in pool at home       Frequency Add 2 additional days to program exercise sessions.          Exercise Comments:     Exercise Comments    Row Name 09/17/15 1605 09/23/15 1213 10/01/15 1551 10/14/15 1222 10/16/15 1515   Exercise Comments Pt did not report any musculoskeletal issues - expected to progress well with exercise.  Noticed that she felt better during 6 min walk with 1 L contiuous oxygen. First full day of exercise!  Patient was oriented to gym and equipment including functions, settings, policies, and procedures.  Patient's individual exercise prescription and treatment plan were reviewed.  All starting workloads were established based on the results of the 6 minute walk test done at initial orientation visit.  The plan for exercise progression was also introduced and progression will be customized based on  patient's performance and goals. Dashonda is off to a good start with her rehab.  She seems to be enjoying coming to class.  She has already progressed to a full 15 min on the treadmill!  We will continue to work with her to increase her workloads to build up strength and stamina. Jonathon has reported that she is having less SOB with daily activities. She has been feeling like she has more energy and has been walking in her pool at home on the days that she does not come to class.  Shaletha has been doing great with exercise.  She continues to make good progression and we hope she will continue to do so.   Row Name 10/30/15 1040 11/04/15 1157 11/08/15 1054 11/13/15 1524     Exercise Comments Nataliya continues to do well with exercise.  She is walking in the pool at home on her off days.  We will continue to monitor for progress. Reviewed home exercise with pt today.  Pt plans to walk in pool and at home for exercise.  She is planning to get a home gym and stationary bike to use as well.  Reviewed THR, pulse, RPE, sign and symptoms, pulse oximetry, and when to call 911 or MD.  Also discussed weather considerations and indoor options.  Pt voiced understanding. Thurma completed her mid program 6 min walk test today.  She improved by 60f.  She started her walk on room air, but did desaturate and finished on 2L.  She has been exercising with 2L. EKaylenacontinues to do well with exercise.  She is up to level 4 on the BioStep.  We will continue to monitor for progress.       Discharge Exercise Prescription (Final Exercise Prescription Changes):  Exercise Prescription Changes - 11/13/15 1500      Exercise Review   Progression Yes     Response to Exercise   Blood Pressure (Admit) 118/64   Blood Pressure (Exercise) 140/80   Blood Pressure (Exit) 124/70   Heart Rate (Admit) 88 bpm   Heart Rate (Exercise) 134 bpm   Heart Rate (Exit) 108 bpm   Oxygen Saturation (Admit) 93 %   Oxygen Saturation (Exercise) 92 %    Oxygen Saturation (Exit) 95 %   Rating of Perceived Exertion (Exercise) 15   Perceived Dyspnea (Exercise) 3   Symptoms shortness of breath on treadmill   Comments Home Exercise Guidelines given 11/04/15   Duration Progress to 45 minutes of aerobic exercise without signs/symptoms of physical distress   Intensity THRR unchanged     Progression   Progression Continue to progress workloads to maintain intensity without signs/symptoms of physical distress.   Average METs 2.62     Resistance Training   Training Prescription Yes   Weight 2   Reps 10-12     Interval Training   Interval Training No     Oxygen   Oxygen Continuous   Liters 2     Treadmill   MPH 1.4   Grade 0.5   Minutes 15   METs 2.17     NuStep   Level 5   Minutes 15   METs 2.7     Biostep-RELP   Level 4   Minutes 15   METs 3     Home Exercise Plan   Plans to continue exercise at Home  walking in pool at home   Frequency Add 2 additional days to program exercise sessions.       Nutrition:  Target Goals: Understanding of nutrition guidelines, daily intake of sodium <1576m, cholesterol <2083m calories 30% from fat and 7% or less from saturated fats, daily to have 5 or more servings of fruits and vegetables.  Biometrics:     Pre Biometrics - 09/17/15 1608      Pre Biometrics   Height 5' 3"  (1.6 m)   Weight 142 lb (64.4 kg)   Waist Circumference 36 inches   Hip Circumference 41.25 inches   Waist to Hip Ratio 0.87 %   BMI (Calculated) 25.2       Nutrition Therapy Plan and Nutrition Goals:     Nutrition Therapy & Goals - 09/17/15 0900      Nutrition Therapy   Diet Prefers not to see the dietitian; Husband cooks healthy meals with vegetables; she does not use salt, but does like sweets; her gopal is to loss 5lbs.      Nutrition Discharge: Rate Your Plate Scores:   Psychosocial: Target Goals: Acknowledge presence or absence of depression, maximize coping skills, provide positive support  system. Participant is able to verbalize types and ability to use techniques and skills needed for reducing stress and depression.  Initial Review & Psychosocial Screening:     Initial Psych Review & Screening - 09/17/15 0900      Family Dynamics   Good Support System? Yes   Comments Ms CaAustadas good support from her children and husband. She states she has no depression, although she does miss walking distances without shortness of breath and is looking forward to partivccipation in [facility].     Barriers   Psychosocial barriers to participate in program The patient should benefit from training in stress management and relaxation.;There are no identifiable barriers or psychosocial needs.  Screening Interventions   Interventions Encouraged to exercise      Quality of Life Scores:     Quality of Life - 09/17/15 0900      Quality of Life Scores   Health/Function Pre 20.19 %   Socioeconomic Pre 26 %   Psych/Spiritual Pre 24.86 %   Family Pre 27.6 %   GLOBAL Pre 23.26 %      PHQ-9: Recent Review Flowsheet Data    Depression screen [facility] 2/9 09/17/2015 09/12/2014   Decreased Interest 0 0   Down, Depressed, Hopeless 0 0   PHQ - 2 Score 0 0   Altered sleeping 0 -   Tired, decreased energy 1 -   Change in appetite 0 -   Feeling bad or failure about yourself  0 -   Trouble concentrating 0 -   Moving slowly or fidgety/restless 0 -   Suicidal thoughts 0 -   PHQ-9 Score 1 -   Difficult doing work/chores Somewhat difficult -      Psychosocial Evaluation and Intervention:     Psychosocial Evaluation - 09/30/15 1116      Psychosocial Evaluation & Interventions   Interventions Encouraged to exercise with the program and follow exercise prescription   Comments Counselor met with Ms. Petrella today for initial psychosocial evaluation.  She is a 70 year old who has been diagnosed with COPD.  Ms. Omura has a strong support system with a spouse of 25 years and (3) adult  children who live close by.  She reports sleeping "okay" for the most part and occasionally has to take an OTC sleep aid.  She denies a history of depression or anxiety, although she states her daughter was concerned about her at one point and insisted she get on Rx for symptoms.  So she has been taking Rx  for her mood for many years and denies any current symptoms.  She states her mood now is typically positive and she has minimal stress in her life.  Her goals for this program are to improve her breathing and increase her energy to be able to do more and travel more with her husband who just retired.  Counselor will follow with Ms. Mayfield throughout the course of this program.        Psychosocial Re-Evaluation:     Psychosocial Re-Evaluation    [place] Name 10/14/15 1144 11/11/15 1114           Psychosocial Re-Evaluation   Comments Counselor follow up with Ms. Klutts reporting she is "surprised" how much she is enjoying this class and how much progress she is already experiencing.  She stated she was able to walk 25 laps in her pool this past weekend and her family have mentioned how much stronger her voice sounds on the phone lately.  Ms. Abeln  maintains a positive attitude and reports minimal stress in her life at this time.  Counselor will continue to follow with Ms. Digilio and commended her on her progress and commitment to consistency in exercising, both here and at home.   Counselor follow up with Ms. Megan Salon.  She stated she was walking better and breathing better since coming into this class.  She was able to go to the beach recently for a short vacation and states she was amazed at how well she did during that trip.  Her mood continues to be positive.  She is contending with some muscle tension in her neck with her Dr. recommending a massage in  the near future.  Counselor will follow with Ms. Wesenberg during the course of treatment in thie program.          Education: Education  Goals: Education classes will be provided on a weekly basis, covering required topics. Participant will state understanding/return demonstration of topics presented.  Learning Barriers/Preferences:     Learning Barriers/Preferences - 09/17/15 0900      Learning Barriers/Preferences   Learning Barriers None   Learning Preferences Group Instruction;Individual Instruction;Pictoral;Skilled Demonstration;Verbal Instruction;Video;Written Material      Education Topics: Initial Evaluation Education: - Verbal, written and demonstration of respiratory meds, RPE/PD scales, oximetry and breathing techniques. Instruction on use of nebulizers and MDIs: cleaning and proper use, rinsing mouth with steroid doses and importance of monitoring MDI activations. Flowsheet Row Pulmonary Rehab from 11/15/2015 in [facility] Cardiac and Pulmonary Rehab  Date  09/17/15  Educator  LB  Instruction Review Code  2- meets goals/outcomes      General Nutrition Guidelines/Fats and Fiber: -Group instruction provided by verbal, written material, models and posters to present the general guidelines for heart healthy nutrition. Gives an explanation and review of dietary fats and fiber. Flowsheet Row Pulmonary Rehab from 11/15/2015 in [facility] Cardiac and Pulmonary Rehab  Date  11/04/15  Educator  CR  Instruction Review Code  2- meets goals/outcomes      Controlling Sodium/Reading Food Labels: -Group verbal and written material supporting the discussion of sodium use in heart healthy nutrition. Review and explanation with models, verbal and written materials for utilization of the food label. Flowsheet Row Pulmonary Rehab from 11/15/2015 in [facility] Cardiac and Pulmonary Rehab  Date  09/30/15  Educator  CR  Instruction Review Code  2- meets goals/outcomes      Exercise Physiology & Risk Factors: - Group verbal and written instruction with models to review the exercise physiology of the cardiovascular system and associated critical  values. Details cardiovascular disease risk factors and the goals associated with each risk factor.   Aerobic Exercise & Resistance Training: - Gives group verbal and written discussion on the health impact of inactivity. On the components of aerobic and resistive training programs and the benefits of this training and how to safely progress through these programs.   Flexibility, Balance, General Exercise Guidelines: - Provides group verbal and written instruction on the benefits of flexibility and balance training programs. Provides general exercise guidelines with specific guidelines to those with heart or lung disease. Demonstration and skill practice provided.   Stress Management: - Provides group verbal and written instruction about the health risks of elevated stress, cause of high stress, and healthy ways to reduce stress.   Depression: - Provides group verbal and written instruction on the correlation between heart/lung disease and depressed mood, treatment options, and the stigmas associated with seeking treatment.   Exercise & Equipment Safety: - Individual verbal instruction and demonstration of equipment use and safety with use of the equipment. Flowsheet Row Pulmonary Rehab from 11/15/2015 in [facility] Cardiac and Pulmonary Rehab  Date  09/17/15  Educator  LB  Instruction Review Code  2- meets goals/outcomes      Infection Prevention: - Provides verbal and written material to individual with discussion of infection control including proper hand washing and proper equipment cleaning during exercise session. Flowsheet Row Pulmonary Rehab from 11/15/2015 in [facility] Cardiac and Pulmonary Rehab  Date  09/17/15  Educator  LB  Instruction Review Code  2- meets goals/outcomes      Falls Prevention: - Provides verbal and written  material to individual with discussion of falls prevention and safety. Flowsheet Row Pulmonary Rehab from 11/15/2015 in [facility] Cardiac and Pulmonary Rehab  Date   09/17/15  Educator  LB  Instruction Review Code  2- meets goals/outcomes      Diabetes: - Individual verbal and written instruction to review signs/symptoms of diabetes, desired ranges of glucose level fasting, after meals and with exercise. Advice that pre and post exercise glucose checks will be done for 3 sessions at entry of program.   Chronic Lung Diseases: - Group verbal and written instruction to review new updates, new respiratory medications, new advancements in procedures and treatments. Provide informative websites and "800" numbers of self-education. Flowsheet Row Pulmonary Rehab from 11/15/2015 in [facility] Cardiac and Pulmonary Rehab  Date  10/28/15  Educator  LB  Instruction Review Code  2- meets goals/outcomes      Lung Procedures: - Group verbal and written instruction to describe testing methods done to diagnose lung disease. Review the outcome of test results. Describe the treatment choices: Pulmonary Function Tests, ABGs and oximetry. Flowsheet Row Pulmonary Rehab from 11/15/2015 in [facility] Cardiac and Pulmonary Rehab  Date  11/01/15  Educator  Erline Levine  Instruction Review Code  2- meets goals/outcomes      Energy Conservation: - Provide group verbal and written instruction for methods to conserve energy, plan and organize activities. Instruct on pacing techniques, use of adaptive equipment and posture/positioning to relieve shortness of breath.   Triggers: - Group verbal and written instruction to review types of environmental controls: home humidity, furnaces, filters, dust mite/pet prevention, HEPA vacuums. To discuss weather changes, air quality and the benefits of nasal washing.   Exacerbations: - Group verbal and written instruction to provide: warning signs, infection symptoms, calling MD promptly, preventive modes, and value of vaccinations. Review: effective airway clearance, coughing and/or vibration techniques. Create an Sports administrator. Flowsheet Row Pulmonary  Rehab from 11/15/2015 in [facility] Cardiac and Pulmonary Rehab  Date  10/07/15  Educator  LB  Instruction Review Code  2- meets goals/outcomes      Oxygen: - Individual and group verbal and written instruction on oxygen therapy. Includes supplement oxygen, available portable oxygen systems, continuous and intermittent flow rates, oxygen safety, concentrators, and Medicare reimbursement for oxygen. Flowsheet Row Pulmonary Rehab from 11/15/2015 in [facility] Cardiac and Pulmonary Rehab  Date  09/17/15  Educator  LB  Instruction Review Code  2- meets goals/outcomes      Respiratory Medications: - Group verbal and written instruction to review medications for lung disease. Drug class, frequency, complications, importance of spacers, rinsing mouth after steroid MDI's, and proper cleaning methods for nebulizers. Flowsheet Row Pulmonary Rehab from 11/15/2015 in [facility] Cardiac and Pulmonary Rehab  Date  09/17/15  Educator  LB  Instruction Review Code  2- meets goals/outcomes      AED/CPR: - Group verbal and written instruction with the use of models to demonstrate the basic use of the AED with the basic ABC's of resuscitation. Flowsheet Row Pulmonary Rehab from 11/15/2015 in [facility] Cardiac and Pulmonary Rehab  Date  11/15/15  Educator  CE  Instruction Review Code  2- meets goals/outcomes      Breathing Retraining: - Provides individuals verbal and written instruction on purpose, frequency, and proper technique of diaphragmatic breathing and pursed-lipped breathing. Applies individual practice skills. Flowsheet Row Pulmonary Rehab from 11/15/2015 in [facility] Cardiac and Pulmonary Rehab  Date  09/17/15  Educator  LB  Instruction Review Code  2- meets goals/outcomes  Anatomy and Physiology of the Lungs: - Group verbal and written instruction with the use of models to provide basic lung anatomy and physiology related to function, structure and complications of lung disease. Flowsheet Row Pulmonary Rehab from  11/15/2015 in [facility] Cardiac and Pulmonary Rehab  Date  09/27/15  Educator  SJ  Instruction Review Code  2- meets goals/outcomes      Heart Failure: - Group verbal and written instruction on the basics of heart failure: signs/symptoms, treatments, explanation of ejection fraction, enlarged heart and cardiomyopathy. Flowsheet Row Pulmonary Rehab from 11/15/2015 in [facility] Cardiac and Pulmonary Rehab  Date  10/11/15 [facility] your numbers]  Educator  C. EnterkinRN  Instruction Review Code  2- meets goals/outcomes      Sleep Apnea: - Individual verbal and written instruction to review Obstructive Sleep Apnea. Review of risk factors, methods for diagnosing and types of masks and machines for OSA.   Anxiety: - Provides group, verbal and written instruction on the correlation between heart/lung disease and anxiety, treatment options, and management of anxiety. Flowsheet Row Pulmonary Rehab from 11/15/2015 in [facility] Cardiac and Pulmonary Rehab  Date  10/23/15  Educator  [facility]  Instruction Review Code  2- Meets goals/outcomes      Relaxation: - Provides group, verbal and written instruction about the benefits of relaxation for patients with heart/lung disease. Also provides patients with examples of relaxation techniques.   Knowledge Questionnaire Score:     Knowledge Questionnaire Score - 09/17/15 0900      Knowledge Questionnaire Score   Pre Score 7/10       Core Components/Risk Factors/Patient Goals at Admission:     Personal Goals and Risk Factors at Admission - 09/17/15 0900      Core Components/Risk Factors/Patient Goals on Admission   Sedentary Yes  Ms Lias's goal is to increase her walking. she may plan to walk in their pool this summer.   Intervention Provide advice, education, support and counseling about physical activity/exercise needs.;Develop an individualized exercise prescription for aerobic and resistive training based on initial evaluation findings, risk stratification,  comorbidities and participant's personal goals.   Expected Outcomes Achievement of increased cardiorespiratory fitness and enhanced flexibility, muscular endurance and strength shown through measurements of functional capacity and personal statement of participant.   Increase Strength and Stamina Yes   Intervention Provide advice, education, support and counseling about physical activity/exercise needs.;Develop an individualized exercise prescription for aerobic and resistive training based on initial evaluation findings, risk stratification, comorbidities and participant's personal goals.   Expected Outcomes Achievement of increased cardiorespiratory fitness and enhanced flexibility, muscular endurance and strength shown through measurements of functional capacity and personal statement of participant.   Improve shortness of breath with ADL's Yes   Intervention Provide education, individualized exercise plan and daily activity instruction to help decrease symptoms of SOB with activities of daily living.   Expected Outcomes Short Term: Achieves a reduction of symptoms when performing activities of daily living.   Develop more efficient breathing techniques such as purse lipped breathing and diaphragmatic breathing; and practicing self-pacing with activity Yes   Intervention Provide education, demonstration and support about specific breathing techniuqes utilized for more efficient breathing. Include techniques such as pursed lipped breathing, diaphragmatic breathing and self-pacing activity.   Expected Outcomes Short Term: Participant will be able to demonstrate and use breathing techniques as needed throughout daily activities.   Increase knowledge of respiratory medications and ability to use respiratory devices properly  Yes  Uses oxygen for sleep and activity -  1l/m; she needs to be encouraged to use with activity on continuous flow; uses Advair, Spiriva, Albuterol MDIs and SVN with Albuterol.    Intervention Provide education and demonstration as needed of appropriate use of medications, inhalers, and oxygen therapy.   Expected Outcomes Short Term: Achieves understanding of medications use. Understands that oxygen is a medication prescribed by physician. Demonstrates appropriate use of inhaler and oxygen therapy.   Hypertension Yes   Intervention Provide education on lifestyle modifcations including regular physical activity/exercise, weight management, moderate sodium restriction and increased consumption of fresh fruit, vegetables, and low fat dairy, alcohol moderation, and smoking cessation.;Monitor prescription use compliance.   Expected Outcomes Short Term: Continued assessment and intervention until BP is < 140/38m HG in hypertensive participants. < 130/870mHG in hypertensive participants with diabetes, heart failure or chronic kidney disease.;Long Term: Maintenance of blood pressure at goal levels.      Core Components/Risk Factors/Patient Goals Review:      Goals and Risk Factor Review    Row Name 09/23/15 1213 09/30/15 1000 10/07/15 1000 10/10/15 1318 10/10/15 1329     Core Components/Risk Factors/Patient Goals Review   Personal Goals Review Develop more efficient breathing techniques such as purse lipped breathing and diaphragmatic breathing and practicing self-pacing with activity. Increase knowledge of respiratory medications and ability to use respiratory devices properly. Develop more efficient breathing techniques such as purse lipped breathing and diaphragmatic breathing and practicing self-pacing with activity.;Improve shortness of breath with ADL's Hypertension;Lipids Hypertension;Lipids   Review Pursed lip breathing techniques were discussed and demonstrated with patient. Patient demonstrated understanding of these techniques. Ms CaBejaranoad a go day on the  treadmill and maintained her O2Sat's in the 90's which reassured and gave her more confidence in using the  treadmill. she recognizes the importance of increasing her oxygen to 2l/m continuous and how this can help her with activity. Ms CaCritztates she is not using her Albuterol Inhaler, only occasionally, instead she is using PLB. She is very aware of weather and her breathing and stays inside on "bad air" days. and does a good job managing her shortness of breath . ElDalyceontinues to maintain good BP control.  She continues to take her hypertension and cholesterol medications as ordered. ElAlleneontinues to maintain good BP control.  She continues to take her hypertension and cholesterol medications as ordered.   Expected Outcomes Patient will independenly use pursed lip breathing techniques to help tolerate exercise.  Continue progressing with oxygen use with her exercise goals. Continue using PLB to manage her COPD. COntinued control of her risk factors through meds, exerse and nutrion plan. COntinued control of her risk factors through meds, exerse and nutrion plan.   [place] 10/14/15 1615 11/04/15 1157 11/04/15 1529         Core Components/Risk Factors/Patient Goals Review   Personal Goals Review Sedentary Sedentary;Improve shortness of breath with ADL's;Increase Strength and Stamina Hypertension;Develop more efficient breathing techniques such as purse lipped breathing and diaphragmatic breathing and practicing self-pacing with activity.;Increase knowledge of respiratory medications and ability to use respiratory devices properly.;Improve shortness of breath with ADL's     Review Ms CaLuepkelans to continue her exercise after [facility]. Her husband is currently looking for a piece of exercise equipment they can use at home. ElCleoas not been doing much exercise at home due to the weather recently.  She is getting in the pool when she is able and is planning to buy some equipment for home use.  She has  noticed that she is able to do more at home with improved SOB and strength and stamina. Ms Fitzhenry  has maintained acceptable BP's at rest and with exercise. She has a good understanding of her MDI's and is very compliant in taking them everyday. She uses PLB during her exercise goals and activities at home. She  has noticed an improvement in her shortness of breath with activites at home and does adjust her oxygen as needed with activity from 2 to 3l/m.     Expected Outcomes Continue exercising with exercise equipment at home. Reviewed home exercise guidelines with Wilhemina today to add in more at home.  She will also continue to come ot exercise and education to continue to boost her strength and stamina.  -        Core Components/Risk Factors/Patient Goals at Discharge (Final Review):      Goals and Risk Factor Review - 11/04/15 1529      Core Components/Risk Factors/Patient Goals Review   Personal Goals Review Hypertension;Develop more efficient breathing techniques such as purse lipped breathing and diaphragmatic breathing and practicing self-pacing with activity.;Increase knowledge of respiratory medications and ability to use respiratory devices properly.;Improve shortness of breath with ADL's   Review Ms Melchor has maintained acceptable BP's at rest and with exercise. She has a good understanding of her MDI's and is very compliant in taking them everyday. She uses PLB during her exercise goals and activities at home. She  has noticed an improvement in her shortness of breath with activites at home and does adjust her oxygen as needed with activity from 2 to 3l/m.      ITP Comments:   Comments: 30 Day Review

## 2015-11-18 NOTE — Progress Notes (Signed)
Daily Session Note  Patient Details  Name: Joyce Rodriguez MRN: [id number] Date of Birth: 03-19-1946 Referring Provider:   Flowsheet Row Pulmonary Rehab from 09/17/2015 in [facility] Cardiac and Pulmonary Rehab  Referring Provider  Clayborn Bigness      Encounter Date: 11/18/2015  Check In:     Session Check In - 11/18/15 1119      Check-In   Location [facility]-Cardiac & Pulmonary Rehab   Staff Present Carson Myrtle, BS, RRT, Respiratory Therapist;Erian Rosengren Amedeo Plenty, BS, ACSM CEP, Exercise Physiologist;Jessica Luan Pulling, MA, ACSM RCEP, Exercise Physiologist   Supervising physician immediately available to respond to emergencies [facility] immediately available ER MD   Physician(s) Burlene Arnt and Kinner   Medication changes reported     No   Fall or balance concerns reported    No   Warm-up and Cool-down Performed on first and last piece of equipment   Resistance Training Performed Yes   VAD Patient? No     Pain Assessment   Currently in Pain? No/denies   Multiple Pain Sites No         Goals Met:  Proper associated with RPD/PD & O2 Sat Independence with exercise equipment Exercise tolerated well Strength training completed today  Goals Unmet:  Not Applicable  Comments: Pt able to follow exercise prescription today without complaint.  Will continue to monitor for progression.    Dr. Emily Filbert is Medical Director for [place] and [facility]monary Rehabilitation.

## 2015-11-22 ENCOUNTER — Encounter: Payer: PPO | Admitting: *Deleted

## 2015-11-22 DIAGNOSIS — J449 Chronic obstructive pulmonary disease, unspecified: Secondary | ICD-10-CM | POA: Diagnosis not present

## 2015-11-22 NOTE — Progress Notes (Signed)
Daily Session Note  Patient Details  Name: Joyce Rodriguez MRN: [id number] Date of Birth: 1946-01-05 Referring Provider:   Flowsheet Row Pulmonary Rehab from 09/17/2015 in [facility] Cardiac and Pulmonary Rehab  Referring Provider  Clayborn Bigness      Encounter Date: 11/22/2015  Check In:     Session Check In - 11/22/15 1109      Check-In   Location [facility]-Cardiac & Pulmonary Rehab   Staff Present Heath Lark, RN, BSN, CCRP;Laureen Owens Shark, BS, RRT, Respiratory Therapist;Quindon Denker, RN, BSN   Supervising physician immediately available to respond to emergencies [facility] immediately available ER MD   Physician(s) Dr. Jacqualine Code and Dr. Corky Downs   Medication changes reported     No   Fall or balance concerns reported    No   Warm-up and Cool-down Performed on first and last piece of equipment   Resistance Training Performed Yes   VAD Patient? No     Pain Assessment   Currently in Pain? No/denies         Goals Met:  Proper associated with RPD/PD & O2 Sat Exercise tolerated well  Goals Unmet:  Not Applicable  Comments:     Dr. Emily Filbert is Medical Director for [place] and [facility] Pulmonary Rehabilitation.

## 2015-11-25 ENCOUNTER — Encounter: Payer: PPO | Admitting: *Deleted

## 2015-11-25 DIAGNOSIS — J449 Chronic obstructive pulmonary disease, unspecified: Secondary | ICD-10-CM

## 2015-11-25 NOTE — Progress Notes (Signed)
Daily Session Note  Patient Details  Name: Joyce Rodriguez MRN: [id number] Date of Birth: [birth year] Referring Provider:   Flowsheet Row Pulmonary Rehab from 09/17/2015 in [facility] Cardiac and Pulmonary Rehab  Referring Provider  Clayborn Bigness      Encounter Date: 11/25/2015  Check In:     Session Check In - 11/25/15 1015      Check-In   Location [facility]-Cardiac & Pulmonary Rehab   Staff Present Carson Myrtle, BS, RRT, Respiratory Bertis Ruddy, BS, ACSM CEP, Exercise Physiologist;Rebecca Brayton El, DPT, CEEA   Supervising physician immediately available to respond to emergencies [facility] immediately available ER MD   Physician(s) Dr. Bayard Beaver and Dr. Marcelene Butte   Medication changes reported     No   Fall or balance concerns reported    No   Warm-up and Cool-down Performed on first and last piece of equipment   Resistance Training Performed Yes   VAD Patient? No     Pain Assessment   Currently in Pain? No/denies   Multiple Pain Sites No         Goals Met:  Proper associated with RPD/PD & O2 Sat Independence with exercise equipment Exercise tolerated well Strength training completed today  Goals Unmet:  Not Applicable  Comments: Pt able to follow exercise prescription today without complaint.  Will continue to monitor for progression.    Dr. Emily Filbert is Medical Director for [name] and [facility] Pulmonary Rehabilitation.

## 2015-11-29 DIAGNOSIS — J449 Chronic obstructive pulmonary disease, unspecified: Secondary | ICD-10-CM | POA: Diagnosis not present

## 2015-11-29 NOTE — Progress Notes (Signed)
Daily Session Note  Patient Details  Name: Joyce Rodriguez MRN: [id number] Date of Birth: Sep 16, 1945 Referring Provider:   Flowsheet Row Pulmonary Rehab from 09/17/2015 in [facility] Cardiac and Pulmonary Rehab  Referring Provider  Clayborn Bigness      Encounter Date: 11/29/2015  Check In:     Session Check In - 11/29/15 1029      Check-In   Location [facility]-Cardiac & Pulmonary Rehab   Staff Present Frederich Cha, RRT, RCP, Respiratory Dareen Piano, BA, ACSM CEP, Exercise Physiologist;Carroll Enterkin, RN, BSN   Supervising physician immediately available to respond to emergencies [facility] immediately available ER MD   Physician(s) Alfred Levins and Jimmye Norman   Medication changes reported     No   Fall or balance concerns reported    No   Warm-up and Cool-down Performed on first and last piece of equipment   Resistance Training Performed Yes   VAD Patient? No     Pain Assessment   Currently in Pain? No/denies   Multiple Pain Sites No         Goals Met:  Proper associated with RPD/PD & O2 Sat Independence with exercise equipment Exercise tolerated well Strength training completed today  Goals Unmet:  Not Applicable  Comments: Pt able to follow exercise prescription today without complaint.  Will continue to monitor for progression.   Dr. Emily Filbert is Medical Director for [place] and [facility]monary Rehabilitation.

## 2015-12-01 DIAGNOSIS — J441 Chronic obstructive pulmonary disease with (acute) exacerbation: Secondary | ICD-10-CM | POA: Diagnosis not present

## 2015-12-02 ENCOUNTER — Encounter: Payer: PPO | Admitting: *Deleted

## 2015-12-02 DIAGNOSIS — J449 Chronic obstructive pulmonary disease, unspecified: Secondary | ICD-10-CM

## 2015-12-02 NOTE — Progress Notes (Signed)
Daily Session Note  Patient Details  Name: Joyce Rodriguez MRN: [id number] Date of Birth: 1945-06-12 Referring Provider:   Flowsheet Row Pulmonary Rehab from 09/17/2015 in [facility] Cardiac and Pulmonary Rehab  Referring Provider  Clayborn Bigness      Encounter Date: 12/02/2015  Check In:     Session Check In - 12/02/15 1006      Check-In   Location [facility]-Cardiac & Pulmonary Rehab   Staff Present Carson Myrtle, BS, RRT, Respiratory Therapist;Rhiannon Sassaman Amedeo Plenty, BS, ACSM CEP, Exercise Physiologist;Jessica Luan Pulling, MA, ACSM RCEP, Exercise Physiologist   Supervising physician immediately available to respond to emergencies [facility] immediately available ER MD   Physician(s) Alfred Levins and Jimmye Norman    Medication changes reported     No   Fall or balance concerns reported    No   Warm-up and Cool-down Performed on first and last piece of equipment   Resistance Training Performed Yes   VAD Patient? No     Pain Assessment   Currently in Pain? No/denies   Multiple Pain Sites No         Goals Met:  Proper associated with RPD/PD & O2 Sat Independence with exercise equipment Exercise tolerated well Strength training completed today  Goals Unmet:  Not Applicable  Comments: Pt able to follow exercise prescription today without complaint.  Will continue to monitor for progression.    Dr. Emily Filbert is Medical Director for [place] and [facility]monary Rehabilitation.

## 2015-12-06 ENCOUNTER — Encounter: Payer: PPO | Admitting: *Deleted

## 2015-12-06 DIAGNOSIS — J449 Chronic obstructive pulmonary disease, unspecified: Secondary | ICD-10-CM | POA: Diagnosis not present

## 2015-12-06 NOTE — Progress Notes (Signed)
Daily Session Note  Patient Details  Name: Meghann Landing MRN: [id number] Date of Birth: 06-16-1945 Referring Provider:   Flowsheet Row Pulmonary Rehab from 09/17/2015 in [facility] Cardiac and Pulmonary Rehab  Referring Provider  Clayborn Bigness      Encounter Date: 12/06/2015  Check In:     Session Check In - 12/06/15 1148      Check-In   Staff Present Heath Lark, RN, BSN, CCRP;Mary Kellie Shropshire, RN, BSN, Cathlean Cower, RRT, RCP, Respiratory Therapist   Supervising physician immediately available to respond to emergencies [facility] immediately available ER MD   Physician(s) Drs: Clearnce Hasten and Archie Balboa   Medication changes reported     No   Fall or balance concerns reported    No   Warm-up and Cool-down Performed on first and last piece of equipment   Resistance Training Performed Yes   VAD Patient? No     Pain Assessment   Currently in Pain? No/denies         Goals Met:  Proper associated with RPD/PD & O2 Sat Independence with exercise equipment Exercise tolerated well Strength training completed today  Goals Unmet:  Not Applicable  Comments: Doing well with exercise prescription progression.    Dr. Emily Filbert is Medical Director for [place] and [facility]monary Rehabilitation.

## 2015-12-09 ENCOUNTER — Encounter: Payer: PPO | Admitting: *Deleted

## 2015-12-09 DIAGNOSIS — J449 Chronic obstructive pulmonary disease, unspecified: Secondary | ICD-10-CM | POA: Diagnosis not present

## 2015-12-09 NOTE — Progress Notes (Signed)
Daily Session Note  Patient Details  Name: Joyce Rodriguez MRN: [id number] Date of Birth: 03-Aug-1945 Referring Provider:   April Manson Pulmonary Rehab from 09/17/2015 in [facility] Cardiac and Pulmonary Rehab  Referring Provider  Clayborn Bigness      Encounter Date: 12/09/2015  Check In:     Session Check In - 12/09/15 1020      Check-In   Location [facility]-Cardiac & Pulmonary Rehab   Staff Present Alberteen Sam, MA, ACSM RCEP, Exercise Physiologist;Kelly Amedeo Plenty, BS, ACSM CEP, Exercise Physiologist;Laureen Owens Shark, BS, RRT, Respiratory Therapist   Supervising physician immediately available to respond to emergencies [facility] immediately available ER MD   Physician(s) Drs. Edd Fabian and Yao   Medication changes reported     No   Fall or balance concerns reported    No   Warm-up and Cool-down Performed as group-led Location manager Performed Yes   VAD Patient? No     Pain Assessment   Currently in Pain? No/denies   Multiple Pain Sites No         Goals Met:  Proper associated with RPD/PD & O2 Sat Independence with exercise equipment Using PLB without cueing & demonstrates good technique Exercise tolerated well Strength training completed today  Goals Unmet:  Not Applicable  Comments: Pt able to follow exercise prescription today without complaint.  Will continue to monitor for progression.    Dr. Emily Filbert is Medical Director for [name] and [facility] Pulmonary Rehabilitation.

## 2015-12-13 ENCOUNTER — Encounter: Payer: PPO | Attending: Internal Medicine | Admitting: *Deleted

## 2015-12-13 DIAGNOSIS — J449 Chronic obstructive pulmonary disease, unspecified: Secondary | ICD-10-CM | POA: Diagnosis not present

## 2015-12-13 NOTE — Progress Notes (Signed)
Daily Session Note  Patient Details  Name: Joyce Rodriguez MRN: [id number] Date of Birth: 1945/11/28 Referring Provider:   April Manson Pulmonary Rehab from 09/17/2015 in [facility] Cardiac and Pulmonary Rehab  Referring Provider  Clayborn Bigness      Encounter Date: 12/13/2015  Check In:     Session Check In - 12/13/15 1100      Check-In   Location [facility]-Cardiac & Pulmonary Rehab   Staff Present Gerlene Burdock, RN, BSN;Stacey Blanch Media, RRT, RCP, Respiratory Dareen Piano, BA, ACSM CEP, Exercise Physiologist   Supervising physician immediately available to respond to emergencies [facility] immediately available ER MD   Physician(s) Drs. Jimmye Norman and Archie Balboa   Medication changes reported     No   Fall or balance concerns reported    No   Warm-up and Cool-down Performed as group-led Location manager Performed Yes   VAD Patient? No     Pain Assessment   Currently in Pain? No/denies   Multiple Pain Sites No         Goals Met:  Proper associated with RPD/PD & O2 Sat Independence with exercise equipment Using PLB without cueing & demonstrates good technique Exercise tolerated well Strength training completed today  Goals Unmet:  Not Applicable   Comments:  Pt able to follow exercise prescription today without complaint.  Will continue to monitor for progression.    Dr. Emily Filbert is Medical Director for [place] and [facility].

## 2015-12-17 ENCOUNTER — Encounter: Payer: Self-pay | Admitting: *Deleted

## 2015-12-17 DIAGNOSIS — J449 Chronic obstructive pulmonary disease, unspecified: Secondary | ICD-10-CM

## 2015-12-17 NOTE — Progress Notes (Signed)
Pulmonary Individual Treatment Plan  Patient Details  Name: Joyce Rodriguez MRN: [id number] Date of Birth: [birth year] Referring Provider:   Flowsheet Row Pulmonary Rehab from 09/17/2015 in [facility] Cardiac and Pulmonary Rehab  Referring Provider  Clayborn Bigness      Initial Encounter Date:  Flowsheet Row Pulmonary Rehab from 09/17/2015 in [facility] Cardiac and Pulmonary Rehab  Date  09/17/15  Referring Provider  Clayborn Bigness      Visit Diagnosis: COPD, moderate ([place])  Patient's Home Medications on Admission:  Current Outpatient Prescriptions:  .  albuterol (PROVENTIL HFA;VENTOLIN HFA) 108 (90 BASE) MCG/ACT inhaler, Inhale 2 puffs into the lungs every 6 (six) hours as needed for shortness of breath., Disp: , Rfl:  .  aspirin EC 81 MG tablet, Take 81 mg by mouth daily., Disp: , Rfl:  .  escitalopram (LEXAPRO) 20 MG tablet, Take 1 tablet (20 mg total) by mouth daily., Disp: 90 tablet, Rfl: 2 .  Fluticasone-Salmeterol (ADVAIR) 250-50 MCG/DOSE AEPB, Inhale 1 puff into the lungs 2 (two) times daily., Disp: , Rfl:  .  losartan (COZAAR) 100 MG tablet, Take 1 tablet (100 mg total) by mouth daily., Disp: 90 tablet, Rfl: 2 .  pantoprazole (PROTONIX) 40 MG tablet, Take 1 tablet (40 mg total) by mouth daily., Disp: 90 tablet, Rfl: 2 .  raloxifene (EVISTA) 60 MG tablet, Take 1 tablet (60 mg total) by mouth daily., Disp: 90 tablet, Rfl: 2 .  rosuvastatin (CRESTOR) 20 MG tablet, Take 1 tablet (20 mg total) by mouth daily., Disp: 90 tablet, Rfl: 2 .  tiotropium (SPIRIVA) 18 MCG inhalation capsule, Place 18 mcg into inhaler and inhale daily., Disp: , Rfl:  .  traMADol (ULTRAM) 50 MG tablet, 1-2 tabs q6 hours as needed for pain, Disp: 100 tablet, Rfl: 0  Past Medical History: Past Medical History:  Diagnosis Date  . COPD (chronic obstructive pulmonary disease) ([place])   . Hyperlipidemia   . Osteoporosis     Tobacco Use: History  Smoking Status  . Former Smoker  . Types: Cigarettes  . Quit date: 03/18/2001   Smokeless Tobacco  . Never Used    Labs: Recent Review Flowsheet Data    Labs for ITP Cardiac and Pulmonary Rehab Latest Ref Rng & Units 03/13/2014 09/12/2014 03/19/2015 11/07/2015   Cholestrol <200 mg/dL 269(A) 168 179 165   LDLCALC 0 - 99 mg/dL 151 67 76 -   HDL >39 mg/dL 89(A) 84 84 -   Trlycerides 0 - 149 mg/dL 144 86 96 -       ADL UCSD:     Pulmonary Assessment Scores    Row Name 09/17/15 0900 11/08/15 1051       ADL UCSD   ADL Phase Entry Mid    SOB Score total 27 44    Rest 0 0    Walk 2 3    Stairs 3 4    Bath 2 1    Dress 1 2    Shop 2 2       Pulmonary Function Assessment:     Pulmonary Function Assessment - 09/17/15 0900      Pulmonary Function Tests   RV% 167 %   DLCO% 28 %     Initial Spirometry Results   FVC% 61 %   FEV1% 36 %   FEV1/FVC Ratio 46     Post Bronchodilator Spirometry Results   FVC% 68 %   FEV1% 37 %   FEV1/FVC Ratio 43     Breath  Bilateral Breath Sounds Decreased;Clear   Shortness of Breath Yes;Fear of Shortness of Breath;Limiting activity      Exercise Target Goals:    Exercise Program Goal: Individual exercise prescription set with THRR, safety & activity barriers. Participant demonstrates ability to understand and report RPE using BORG scale, to self-measure pulse accurately, and to acknowledge the importance of the exercise prescription.  Exercise Prescription Goal: Starting with aerobic activity 30 plus minutes a day, 3 days per week for initial exercise prescription. Provide home exercise prescription and guidelines that participant acknowledges understanding prior to discharge.  Activity Barriers & Risk Stratification:     Activity Barriers & Cardiac Risk Stratification - 09/17/15 0900      Activity Barriers & Cardiac Risk Stratification   Activity Barriers Shortness of Breath;Deconditioning;Muscular Weakness   Cardiac Risk Stratification Moderate      6 Minute Walk:     6 Minute Walk    Row Name  09/17/15 1536 11/08/15 1051       6 Minute Walk   Phase  - Mid Program    Distance 858 feet 940 feet    Distance % Change  - 9.5 %    Walk Time 6 minutes 6 minutes    # of Rest Breaks 0 0    MPH 1.63 1.78    METS 2.48 2.46    RPE 13 13    Perceived Dyspnea   - 3    VO2 Peak 8.7 8.6    Symptoms No No    Resting HR 100 bpm 88 bpm    Resting BP 118/66 122/70    Max Ex. HR 120 bpm 120 bpm    Max Ex. BP 132/68 110/76    2 Minute Post BP  - 100/70      Interval HR   Baseline HR 100 88    1 Minute HR 110 106    2 Minute HR 114 112    3 Minute HR 116 110    4 Minute HR 118 110    5 Minute HR 120 119    6 Minute HR 118 120    2 Minute Post HR 106 103    Interval Heart Rate? Yes Yes      Interval Oxygen   Interval Oxygen? Yes Yes    Baseline Oxygen Saturation % 92 % 93 %    Baseline Liters of Oxygen 1 L 0 L  Room Air    1 Minute Oxygen Saturation % 95 % 93 %    1 Minute Liters of Oxygen 1 L 0 L    2 Minute Oxygen Saturation % 93 % 87 %    2 Minute Liters of Oxygen 1 L 0 L    3 Minute Oxygen Saturation % 91 % 84 %    3 Minute Liters of Oxygen 1 L 0 L    4 Minute Oxygen Saturation % 88 % 82 %    4 Minute Liters of Oxygen 1 L 0 L    5 Minute Oxygen Saturation % 88 % 84 %    5 Minute Liters of Oxygen 1 L 2 L    6 Minute Oxygen Saturation % 88 % 84 %    6 Minute Liters of Oxygen 1 L 2 L    2 Minute Post Oxygen Saturation % 94 % 97 %    2 Minute Post Liters of Oxygen 1 L 2 L       Initial Exercise Prescription:  Initial Exercise Prescription - 09/17/15 1500      Date of Initial Exercise RX and Referring Provider   Date 09/17/15   Referring Provider Clayborn Bigness     Oxygen   Oxygen Continuous   Liters 1     Treadmill   MPH 1.5   Grade 0   Minutes 15   METs 2.4     NuStep   Level 3   Watts 30   Minutes 15   METs 2.4     Recumbant Elliptical   Level 1   RPM 50   Watts 30   Minutes 15   METs 2.4     Biostep-RELP   Level 3   Watts 30   Minutes 15    METs 2.4     Prescription Details   Frequency (times per week) 3-5   Duration Progress to 45 minutes of aerobic exercise without signs/symptoms of physical distress     Intensity   THRR 40-80% of Max Heartrate 120-141   Ratings of Perceived Exertion 11-15   Perceived Dyspnea 0-4     Progression   Progression Continue to progress workloads to maintain intensity without signs/symptoms of physical distress.     Resistance Training   Training Prescription Yes   Weight 2   Reps 10-12      Perform Capillary Blood Glucose checks as needed.  Exercise Prescription Changes:     Exercise Prescription Changes    Row Name 09/23/15 1200 10/01/15 1500 10/16/15 1500 10/30/15 1000 11/08/15 1000     Exercise Review   Progression  - Yes Yes Yes Yes     Response to Exercise   Blood Pressure (Admit)  - 124/80 120/68 110/64  -   Blood Pressure (Exercise)  - 160/84 148/82 120/66  -   Blood Pressure (Exit)  - 102/60 100/70 114/64  -   Heart Rate (Admit)  - 94 bpm 92 bpm 86 bpm  -   Heart Rate (Exercise)  - 118 bpm 120 bpm 119 bpm  -   Heart Rate (Exit)  - 102 bpm 103 bpm 103 bpm  -   Oxygen Saturation (Admit)  - 95 % 91 % 92 %  -   Oxygen Saturation (Exercise)  - 88 % 89 % 94 %  -   Oxygen Saturation (Exit)  - 96 % 97 % 96 %  -   Rating of Perceived Exertion (Exercise)  - 13 11 11   -   Perceived Dyspnea (Exercise)  - 3 3 3   -   Symptoms  - shortness of breath on treadmill  - shortness of breath on treadmill shortness of breath on treadmill   Comments  -  -  -  - Home Exercise Guidelines given 11/04/15   Duration  - Progress to 45 minutes of aerobic exercise without signs/symptoms of physical distress Progress to 45 minutes of aerobic exercise without signs/symptoms of physical distress Progress to 45 minutes of aerobic exercise without signs/symptoms of physical distress Progress to 45 minutes of aerobic exercise without signs/symptoms of physical distress   Intensity  - THRR unchanged THRR  unchanged THRR unchanged THRR unchanged     Progression   Progression  - Continue to progress workloads to maintain intensity without signs/symptoms of physical distress. Continue to progress workloads to maintain intensity without signs/symptoms of physical distress. Continue to progress workloads to maintain intensity without signs/symptoms of physical distress. Continue to progress workloads to maintain intensity without signs/symptoms of physical distress.  Average METs  - 2.1 2.8 2.7 2.7     Resistance Training   Training Prescription Yes Yes Yes Yes Yes   Weight 2 2 2 2 2    Reps 10-12 10-12 10-12 10-12 10-12     Interval Training   Interval Training  - No No No No     Oxygen   Oxygen Continuous Continuous Continuous Continuous Continuous   Liters 1 2 2 2 2      Treadmill   MPH 1.2 1.2 1.4 1.4 1.4   Grade 0 0 0 0.5 0.5   Minutes 15 15 15 15 15    METs 2.4 1.92 2.07 2.17 2.17     NuStep   Level 3 3 4 5 5    Watts 25 27 36  -  -   Minutes 15 15 15 15 15    METs 2.4 2 2.7 2.9 2.9     Recumbant Elliptical   Level 1  omitted today due to time  -  -  -  -   RPM 50  -  -  -  -   Watts 30  -  -  -  -   Minutes 15  -  -  -  -   METs 2.4  -  -  -  -     Biostep-RELP   Level 3 3 3 3 3    Watts 20 23 30   -  -   Minutes 8 15 15 15 15    METs 2.4 2 2.5 3 3      Home Exercise Plan   Plans to continue exercise at  -  - Home  walking in pool at home Home  walking in pool at home Home  walking in pool at home   Frequency  -  - Add 2 additional days to program exercise sessions. Add 2 additional days to program exercise sessions. Add 2 additional days to program exercise sessions.   [place] Name 11/13/15 1500 11/27/15 1400 12/11/15 1500         Exercise Review   Progression Yes Yes Yes       Response to Exercise   Blood Pressure (Admit) 118/64 110/68 102/74     Blood Pressure (Exercise) 140/80 144/70 138/70     Blood Pressure (Exit) 124/70 110/70 106/64     Heart Rate (Admit) 88  bpm 85 bpm 93 bpm     Heart Rate (Exercise) 134 bpm 128 bpm 128 bpm     Heart Rate (Exit) 108 bpm 91 bpm 107 bpm     Oxygen Saturation (Admit) 93 % 92 % 92 %     Oxygen Saturation (Exercise) 92 % 87 % 91 %     Oxygen Saturation (Exit) 95 % 95 % 97 %     Rating of Perceived Exertion (Exercise) 15 13 13      Perceived Dyspnea (Exercise) 3 3 3      Symptoms shortness of breath on treadmill shortness of breath on treadmill shortness of breath on treadmill     Comments Home Exercise Guidelines given 11/04/15 Home Exercise Guidelines given 11/04/15 Home Exercise Guidelines given 11/04/15     Duration Progress to 45 minutes of aerobic exercise without signs/symptoms of physical distress Progress to 45 minutes of aerobic exercise without signs/symptoms of physical distress Progress to 45 minutes of aerobic exercise without signs/symptoms of physical distress     Intensity THRR unchanged THRR unchanged THRR unchanged       Progression   Progression Continue  to progress workloads to maintain intensity without signs/symptoms of physical distress. Continue to progress workloads to maintain intensity without signs/symptoms of physical distress. Continue to progress workloads to maintain intensity without signs/symptoms of physical distress.     Average METs 2.62 2.7 2.78       Resistance Training   Training Prescription Yes Yes Yes     Weight 2 3 lbs 3 lbs  2 lbs left hand     Reps 10-12 10-15 10-15       Interval Training   Interval Training No No No       Oxygen   Oxygen Continuous Continuous Continuous     Liters 2 2 2   3L on treadmill       Treadmill   MPH 1.4 1.5 1.5     Grade 0.5 0.5 0.5     Minutes 15 15 15      METs 2.17 2.2 2.25       NuStep   Level 5 5 5      Minutes 15 15 15      METs 2.7 2.9 3.1       Biostep-RELP   Level 4 5 5      Minutes 15 15 15      METs 3 3 3        Home Exercise Plan   Plans to continue exercise at Home  walking in pool at home Home  walking in pool at  home Home  walking in pool at home     Frequency Add 2 additional days to program exercise sessions. Add 2 additional days to program exercise sessions. Add 2 additional days to program exercise sessions.        Exercise Comments:     Exercise Comments    Row Name 09/17/15 1605 09/23/15 1213 10/01/15 1551 10/14/15 1222 10/16/15 1515   Exercise Comments Pt did not report any musculoskeletal issues - expected to progress well with exercise.  Noticed that she felt better during 6 min walk with 1 L contiuous oxygen. First full day of exercise!  Patient was oriented to gym and equipment including functions, settings, policies, and procedures.  Patient's individual exercise prescription and treatment plan were reviewed.  All starting workloads were established based on the results of the 6 minute walk test done at initial orientation visit.  The plan for exercise progression was also introduced and progression will be customized based on patient's performance and goals. Yuleidy is off to a good start with her rehab.  She seems to be enjoying coming to class.  She has already progressed to a full 15 min on the treadmill!  We will continue to work with her to increase her workloads to build up strength and stamina. Shannan has reported that she is having less SOB with daily activities. She has been feeling like she has more energy and has been walking in her pool at home on the days that she does not come to class.  Matalie has been doing great with exercise.  She continues to make good progression and we hope she will continue to do so.   Row Name 10/30/15 1040 11/04/15 1157 11/08/15 1054 11/13/15 1524 11/27/15 1444   Exercise Comments Zahava continues to do well with exercise.  She is walking in the pool at home on her off days.  We will continue to monitor for progress. Reviewed home exercise with pt today.  Pt plans to walk in pool and at home for exercise.  She is planning to  get a home gym and stationary bike to  use as well.  Reviewed THR, pulse, RPE, sign and symptoms, pulse oximetry, and when to call 911 or MD.  Also discussed weather considerations and indoor options.  Pt voiced understanding. Dasja completed her mid program 6 min walk test today.  She improved by 48f.  She started her walk on room air, but did desaturate and finished on 2L.  She has been exercising with 2L. EAashvicontinues to do well with exercise.  She is up to level 4 on the BioStep.  We will continue to monitor for progress. EPalestineis making good progress with exercise.  She is walking at home and is up to level 5 on the BioStep.  We will continue to monitor for progression.   Row Name 12/11/15 1526           Exercise Comments EJasiahcontinues to do well with exercise.  She is up to level 6 now on the stepper.  We will continue to monitor for progression.          Discharge Exercise Prescription (Final Exercise Prescription Changes):     Exercise Prescription Changes - 12/11/15 1500      Exercise Review   Progression Yes     Response to Exercise   Blood Pressure (Admit) 102/74   Blood Pressure (Exercise) 138/70   Blood Pressure (Exit) 106/64   Heart Rate (Admit) 93 bpm   Heart Rate (Exercise) 128 bpm   Heart Rate (Exit) 107 bpm   Oxygen Saturation (Admit) 92 %   Oxygen Saturation (Exercise) 91 %   Oxygen Saturation (Exit) 97 %   Rating of Perceived Exertion (Exercise) 13   Perceived Dyspnea (Exercise) 3   Symptoms shortness of breath on treadmill   Comments Home Exercise Guidelines given 11/04/15   Duration Progress to 45 minutes of aerobic exercise without signs/symptoms of physical distress   Intensity THRR unchanged     Progression   Progression Continue to progress workloads to maintain intensity without signs/symptoms of physical distress.   Average METs 2.78     Resistance Training   Training Prescription Yes   Weight 3 lbs  2 lbs left hand   Reps 10-15     Interval Training   Interval Training No      Oxygen   Oxygen Continuous   Liters 2  3L on treadmill     Treadmill   MPH 1.5   Grade 0.5   Minutes 15   METs 2.25     NuStep   Level 5   Minutes 15   METs 3.1     Biostep-RELP   Level 5   Minutes 15   METs 3     Home Exercise Plan   Plans to continue exercise at Home  walking in pool at home   Frequency Add 2 additional days to program exercise sessions.       Nutrition:  Target Goals: Understanding of nutrition guidelines, daily intake of sodium <15048m cholesterol <20040mcalories 30% from fat and 7% or less from saturated fats, daily to have 5 or more servings of fruits and vegetables.  Biometrics:     Pre Biometrics - 09/17/15 1608      Pre Biometrics   Height 5' 3"  (1.6 m)   Weight 142 lb (64.4 kg)   Waist Circumference 36 inches   Hip Circumference 41.25 inches   Waist to Hip Ratio 0.87 %   BMI (Calculated) 25.2  Nutrition Therapy Plan and Nutrition Goals:     Nutrition Therapy & Goals - 09/17/15 0900      Nutrition Therapy   Diet Prefers not to see the dietitian; Husband cooks healthy meals with vegetables; she does not use salt, but does like sweets; her gopal is to loss 5lbs.      Nutrition Discharge: Rate Your Plate Scores:   Psychosocial: Target Goals: Acknowledge presence or absence of depression, maximize coping skills, provide positive support system. Participant is able to verbalize types and ability to use techniques and skills needed for reducing stress and depression.  Initial Review & Psychosocial Screening:     Initial Psych Review & Screening - 09/17/15 0900      Family Dynamics   Good Support System? Yes   Comments Ms Zynda has good support from her children and husband. She states she has no depression, although she does miss walking distances without shortness of breath and is looking forward to partivccipation in [facility].     Barriers   Psychosocial barriers to participate in program The patient should  benefit from training in stress management and relaxation.;There are no identifiable barriers or psychosocial needs.     Screening Interventions   Interventions Encouraged to exercise      Quality of Life Scores:     Quality of Life - 09/17/15 0900      Quality of Life Scores   Health/Function Pre 20.19 %   Socioeconomic Pre 26 %   Psych/Spiritual Pre 24.86 %   Family Pre 27.6 %   GLOBAL Pre 23.26 %      PHQ-9: Recent Review Flowsheet Data    Depression screen [facility] 2/9 09/17/2015 09/12/2014   Decreased Interest 0 0   Down, Depressed, Hopeless 0 0   PHQ - 2 Score 0 0   Altered sleeping 0 -   Tired, decreased energy 1 -   Change in appetite 0 -   Feeling bad or failure about yourself  0 -   Trouble concentrating 0 -   Moving slowly or fidgety/restless 0 -   Suicidal thoughts 0 -   PHQ-9 Score 1 -   Difficult doing work/chores Somewhat difficult -      Psychosocial Evaluation and Intervention:     Psychosocial Evaluation - 09/30/15 1116      Psychosocial Evaluation & Interventions   Interventions Encouraged to exercise with the program and follow exercise prescription   Comments Counselor met with Ms. Weaber today for initial psychosocial evaluation.  She is a 70 year old who has been diagnosed with COPD.  Ms. Bartko has a strong support system with a spouse of 25 years and (3) adult children who live close by.  She reports sleeping "okay" for the most part and occasionally has to take an OTC sleep aid.  She denies a history of depression or anxiety, although she states her daughter was concerned about her at one point and insisted she get on Rx for symptoms.  So she has been taking Rx  for her mood for many years and denies any current symptoms.  She states her mood now is typically positive and she has minimal stress in her life.  Her goals for this program are to improve her breathing and increase her energy to be able to do more and travel more with her husband who just  retired.  Counselor will follow with Ms. Mulhall throughout the course of this program.        Psychosocial  Re-Evaluation:     Psychosocial Re-Evaluation    Row Name 10/14/15 1144 11/11/15 1114 11/25/15 1145         Psychosocial Re-Evaluation   Comments Counselor follow up with Ms. Lynds reporting she is "surprised" how much she is enjoying this class and how much progress she is already experiencing.  She stated she was able to walk 25 laps in her pool this past weekend and her family have mentioned how much stronger her voice sounds on the phone lately.  Ms. Barros  maintains a positive attitude and reports minimal stress in her life at this time.  Counselor will continue to follow with Ms. Kren and commended her on her progress and commitment to consistency in exercising, both here and at home.   Counselor follow up with Ms. Megan Salon.  She stated she was walking better and breathing better since coming into this class.  She was able to go to the beach recently for a short vacation and states she was amazed at how well she did during that trip.  Her mood continues to be positive.  She is contending with some muscle tension in her neck with her Dr. recommending a massage in the near future.  Counselor will follow with Ms. Staten during the course of treatment in thie program.   Counselor follow up with Ms. Palen reporting continued positive results from this program.  She states she "loves it" and is able to do so much more now since beginning.  She even "cleaned her wood floors" this past week and her spouse was amazed.  Her mood is typically positive and her Dr. states "all is well."  She recently began to have difficulty with getting to sleep and counselor encouraged a natural OTC sleep aid if this does not improve as well as speaking to Dr. or Pharmacist.  Counselor will continue to follow with Ms. Megan Salon.         Education: Education Goals: Education classes will be provided  on a weekly basis, covering required topics. Participant will state understanding/return demonstration of topics presented.  Learning Barriers/Preferences:     Learning Barriers/Preferences - 09/17/15 0900      Learning Barriers/Preferences   Learning Barriers None   Learning Preferences Group Instruction;Individual Instruction;Pictoral;Skilled Demonstration;Verbal Instruction;Video;Written Material      Education Topics: Initial Evaluation Education: - Verbal, written and demonstration of respiratory meds, RPE/PD scales, oximetry and breathing techniques. Instruction on use of nebulizers and MDIs: cleaning and proper use, rinsing mouth with steroid doses and importance of monitoring MDI activations. Flowsheet Row Pulmonary Rehab from 12/06/2015 in [facility] Cardiac and Pulmonary Rehab  Date  09/17/15  Educator  LB  Instruction Review Code  2- meets goals/outcomes      General Nutrition Guidelines/Fats and Fiber: -Group instruction provided by verbal, written material, models and posters to present the general guidelines for heart healthy nutrition. Gives an explanation and review of dietary fats and fiber. Flowsheet Row Pulmonary Rehab from 12/06/2015 in [facility] Cardiac and Pulmonary Rehab  Date  11/04/15  Educator  CR  Instruction Review Code  2- meets goals/outcomes      Controlling Sodium/Reading Food Labels: -Group verbal and written material supporting the discussion of sodium use in heart healthy nutrition. Review and explanation with models, verbal and written materials for utilization of the food label. Flowsheet Row Pulmonary Rehab from 12/06/2015 in [facility] Cardiac and Pulmonary Rehab  Date  09/30/15  Educator  CR  Instruction Review Code  2- meets goals/outcomes  Exercise Physiology & Risk Factors: - Group verbal and written instruction with models to review the exercise physiology of the cardiovascular system and associated critical values. Details cardiovascular disease  risk factors and the goals associated with each risk factor.   Aerobic Exercise & Resistance Training: - Gives group verbal and written discussion on the health impact of inactivity. On the components of aerobic and resistive training programs and the benefits of this training and how to safely progress through these programs.   Flexibility, Balance, General Exercise Guidelines: - Provides group verbal and written instruction on the benefits of flexibility and balance training programs. Provides general exercise guidelines with specific guidelines to those with heart or lung disease. Demonstration and skill practice provided.   Stress Management: - Provides group verbal and written instruction about the health risks of elevated stress, cause of high stress, and healthy ways to reduce stress.   Depression: - Provides group verbal and written instruction on the correlation between heart/lung disease and depressed mood, treatment options, and the stigmas associated with seeking treatment.   Exercise & Equipment Safety: - Individual verbal instruction and demonstration of equipment use and safety with use of the equipment. Flowsheet Row Pulmonary Rehab from 12/06/2015 in [facility] Cardiac and Pulmonary Rehab  Date  09/17/15  Educator  LB  Instruction Review Code  2- meets goals/outcomes      Infection Prevention: - Provides verbal and written material to individual with discussion of infection control including proper hand washing and proper equipment cleaning during exercise session. Flowsheet Row Pulmonary Rehab from 12/06/2015 in [facility] Cardiac and Pulmonary Rehab  Date  09/17/15  Educator  LB  Instruction Review Code  2- meets goals/outcomes      Falls Prevention: - Provides verbal and written material to individual with discussion of falls prevention and safety. Flowsheet Row Pulmonary Rehab from 12/06/2015 in [facility] Cardiac and Pulmonary Rehab  Date  09/17/15  Educator  LB   Instruction Review Code  2- meets goals/outcomes      Diabetes: - Individual verbal and written instruction to review signs/symptoms of diabetes, desired ranges of glucose level fasting, after meals and with exercise. Advice that pre and post exercise glucose checks will be done for 3 sessions at entry of program.   Chronic Lung Diseases: - Group verbal and written instruction to review new updates, new respiratory medications, new advancements in procedures and treatments. Provide informative websites and "800" numbers of self-education. Flowsheet Row Pulmonary Rehab from 12/06/2015 in [facility] Cardiac and Pulmonary Rehab  Date  10/28/15  Educator  LB  Instruction Review Code  2- meets goals/outcomes      Lung Procedures: - Group verbal and written instruction to describe testing methods done to diagnose lung disease. Review the outcome of test results. Describe the treatment choices: Pulmonary Function Tests, ABGs and oximetry. Flowsheet Row Pulmonary Rehab from 12/06/2015 in [facility] Cardiac and Pulmonary Rehab  Date  11/01/15  Educator  Erline Levine  Instruction Review Code  2- meets goals/outcomes      Energy Conservation: - Provide group verbal and written instruction for methods to conserve energy, plan and organize activities. Instruct on pacing techniques, use of adaptive equipment and posture/positioning to relieve shortness of breath.   Triggers: - Group verbal and written instruction to review types of environmental controls: home humidity, furnaces, filters, dust mite/pet prevention, HEPA vacuums. To discuss weather changes, air quality and the benefits of nasal washing.   Exacerbations: - Group verbal and written instruction to provide: warning signs,  infection symptoms, calling MD promptly, preventive modes, and value of vaccinations. Review: effective airway clearance, coughing and/or vibration techniques. Create an Sports administrator. Flowsheet Row Pulmonary Rehab from 12/06/2015 in  [facility] Cardiac and Pulmonary Rehab  Date  10/07/15  Educator  LB  Instruction Review Code  2- meets goals/outcomes      Oxygen: - Individual and group verbal and written instruction on oxygen therapy. Includes supplement oxygen, available portable oxygen systems, continuous and intermittent flow rates, oxygen safety, concentrators, and Medicare reimbursement for oxygen. Flowsheet Row Pulmonary Rehab from 12/06/2015 in [facility] Cardiac and Pulmonary Rehab  Date  09/17/15  Educator  LB  Instruction Review Code  2- meets goals/outcomes      Respiratory Medications: - Group verbal and written instruction to review medications for lung disease. Drug class, frequency, complications, importance of spacers, rinsing mouth after steroid MDI's, and proper cleaning methods for nebulizers. Flowsheet Row Pulmonary Rehab from 12/06/2015 in [facility] Cardiac and Pulmonary Rehab  Date  09/17/15  Educator  LB  Instruction Review Code  2- meets goals/outcomes      AED/CPR: - Group verbal and written instruction with the use of models to demonstrate the basic use of the AED with the basic ABC's of resuscitation. Flowsheet Row Pulmonary Rehab from 12/06/2015 in [facility] Cardiac and Pulmonary Rehab  Date  11/15/15  Educator  CE  Instruction Review Code  2- meets goals/outcomes      Breathing Retraining: - Provides individuals verbal and written instruction on purpose, frequency, and proper technique of diaphragmatic breathing and pursed-lipped breathing. Applies individual practice skills. Flowsheet Row Pulmonary Rehab from 12/06/2015 in [facility] Cardiac and Pulmonary Rehab  Date  09/17/15  Educator  LB  Instruction Review Code  2- meets goals/outcomes      Anatomy and Physiology of the Lungs: - Group verbal and written instruction with the use of models to provide basic lung anatomy and physiology related to function, structure and complications of lung disease. Flowsheet Row Pulmonary Rehab from 12/06/2015 in [facility]  Cardiac and Pulmonary Rehab  Date  10/04/15  Educator  [place]  Instruction Review Code  2- meets goals/outcomes      Heart Failure: - Group verbal and written instruction on the basics of heart failure: signs/symptoms, treatments, explanation of ejection fraction, enlarged heart and cardiomyopathy. Flowsheet Row Pulmonary Rehab from 12/06/2015 in [facility] Cardiac and Pulmonary Rehab  Date  10/11/15 [facility] [address] your numbers]  Educator  C. EnterkinRN  Instruction Review Code  2- meets goals/outcomes      Sleep Apnea: - Individual verbal and written instruction to review Obstructive Sleep Apnea. Review of risk factors, methods for diagnosing and types of masks and machines for OSA.   Anxiety: - Provides group, verbal and written instruction on the correlation between heart/lung disease and anxiety, treatment options, and management of anxiety. Flowsheet Row Pulmonary Rehab from 12/06/2015 in [facility] Cardiac and Pulmonary Rehab  Date  10/23/15  Educator  [facility]  Instruction Review Code  2- Meets goals/outcomes      Relaxation: - Provides group, verbal and written instruction about the benefits of relaxation for patients with heart/lung disease. Also provides patients with examples of relaxation techniques.   Knowledge Questionnaire Score:     Knowledge Questionnaire Score - 09/17/15 0900      Knowledge Questionnaire Score   Pre Score 7/10       Core Components/Risk Factors/Patient Goals at Admission:     Personal Goals and Risk Factors at Admission - 09/17/15 0900  Core Components/Risk Factors/Patient Goals on Admission   Sedentary Yes  Ms Pant's goal is to increase her walking. she may plan to walk in their pool this summer.   Intervention Provide advice, education, support and counseling about physical activity/exercise needs.;Develop an individualized exercise prescription for aerobic and resistive training based on initial evaluation findings, risk stratification, comorbidities  and participant's personal goals.   Expected Outcomes Achievement of increased cardiorespiratory fitness and enhanced flexibility, muscular endurance and strength shown through measurements of functional capacity and personal statement of participant.   Increase Strength and Stamina Yes   Intervention Provide advice, education, support and counseling about physical activity/exercise needs.;Develop an individualized exercise prescription for aerobic and resistive training based on initial evaluation findings, risk stratification, comorbidities and participant's personal goals.   Expected Outcomes Achievement of increased cardiorespiratory fitness and enhanced flexibility, muscular endurance and strength shown through measurements of functional capacity and personal statement of participant.   Improve shortness of breath with ADL's Yes   Intervention Provide education, individualized exercise plan and daily activity instruction to help decrease symptoms of SOB with activities of daily living.   Expected Outcomes Short Term: Achieves a reduction of symptoms when performing activities of daily living.   Develop more efficient breathing techniques such as purse lipped breathing and diaphragmatic breathing; and practicing self-pacing with activity Yes   Intervention Provide education, demonstration and support about specific breathing techniuqes utilized for more efficient breathing. Include techniques such as pursed lipped breathing, diaphragmatic breathing and self-pacing activity.   Expected Outcomes Short Term: Participant will be able to demonstrate and use breathing techniques as needed throughout daily activities.   Increase knowledge of respiratory medications and ability to use respiratory devices properly  Yes  Uses oxygen for sleep and activity - 1l/m; she needs to be encouraged to use with activity on continuous flow; uses Advair, Spiriva, Albuterol MDIs and SVN with Albuterol.   Intervention  Provide education and demonstration as needed of appropriate use of medications, inhalers, and oxygen therapy.   Expected Outcomes Short Term: Achieves understanding of medications use. Understands that oxygen is a medication prescribed by physician. Demonstrates appropriate use of inhaler and oxygen therapy.   Hypertension Yes   Intervention Provide education on lifestyle modifcations including regular physical activity/exercise, weight management, moderate sodium restriction and increased consumption of fresh fruit, vegetables, and low fat dairy, alcohol moderation, and smoking cessation.;Monitor prescription use compliance.   Expected Outcomes Short Term: Continued assessment and intervention until BP is < 140/52m HG in hypertensive participants. < 130/827mHG in hypertensive participants with diabetes, heart failure or chronic kidney disease.;Long Term: Maintenance of blood pressure at goal levels.      Core Components/Risk Factors/Patient Goals Review:      Goals and Risk Factor Review    Row Name 09/23/15 1213 09/30/15 1000 10/07/15 1000 10/10/15 1318 10/10/15 1329     Core Components/Risk Factors/Patient Goals Review   Personal Goals Review Develop more efficient breathing techniques such as purse lipped breathing and diaphragmatic breathing and practicing self-pacing with activity. Increase knowledge of respiratory medications and ability to use respiratory devices properly. Develop more efficient breathing techniques such as purse lipped breathing and diaphragmatic breathing and practicing self-pacing with activity.;Improve shortness of breath with ADL's Hypertension;Lipids Hypertension;Lipids   Review Pursed lip breathing techniques were discussed and demonstrated with patient. Patient demonstrated understanding of these techniques. Ms CaKnezevicad a go day on the  treadmill and maintained her O2Sat's in the 90's which reassured and gave her more confidence  in using the treadmill. she  recognizes the importance of increasing her oxygen to 2l/m continuous and how this can help her with activity. Ms Rauth states she is not using her Albuterol Inhaler, only occasionally, instead she is using PLB. She is very aware of weather and her breathing and stays inside on "bad air" days. and does a good job managing her shortness of breath . Concettina continues to maintain good BP control.  She continues to take her hypertension and cholesterol medications as ordered. Alee continues to maintain good BP control.  She continues to take her hypertension and cholesterol medications as ordered.   Expected Outcomes Patient will independenly use pursed lip breathing techniques to help tolerate exercise.  Continue progressing with oxygen use with her exercise goals. Continue using PLB to manage her COPD. COntinued control of her risk factors through meds, exerse and nutrion plan. COntinued control of her risk factors through meds, exerse and nutrion plan.   [place] Name 10/14/15 1615 11/04/15 1157 11/04/15 1529 11/22/15 1123 11/26/15 0723     Core Components/Risk Factors/Patient Goals Review   Personal Goals Review Sedentary Sedentary;Improve shortness of breath with ADL's;Increase Strength and Stamina Hypertension;Develop more efficient breathing techniques such as purse lipped breathing and diaphragmatic breathing and practicing self-pacing with activity.;Increase knowledge of respiratory medications and ability to use respiratory devices properly.;Improve shortness of breath with ADL's Lipids;Hypertension Increase knowledge of respiratory medications and ability to use respiratory devices properly.;Develop more efficient breathing techniques such as purse lipped breathing and diaphragmatic breathing and practicing self-pacing with activity.;Improve shortness of breath with ADL's;Sedentary;Increase Strength and Stamina   Review Ms Cassidy plans to continue her exercise after [facility]. Her husband is currently  looking for a piece of exercise equipment they can use at home. Anuja has not been doing much exercise at home due to the weather recently.  She is getting in the pool when she is able and is planning to buy some equipment for home use.  She has noticed that she is able to do more at home with improved SOB and strength and stamina. Ms Wojdyla has maintained acceptable BP's at rest and with exercise. She has a good understanding of her MDI's and is very compliant in taking them everyday. She uses PLB during her exercise goals and activities at home. She  has noticed an improvement in her shortness of breath with activites at home and does adjust her oxygen as needed with activity from 2 to 3l/m. BP readings remain in good levels. Recent blod panel done,reports Cholesterol nu mbers are good.  Antoria continues to be compliant with  her meds, exercise and nutriotion plan.  She has needed to drecrease her BP med by half, her MD is aware. Ms Neubert states she is feeling stronger and doing more walk around their house. The other day she mopped their hardwood floor in the kitchen - which she had not done for time time. She still has good and bad days with her shortness of breath, but with pacing and PLB, she manages the breathing well.   Expected Outcomes Continue exercising with exercise equipment at home. Reviewed home exercise guidelines with [place] today to add in more at home.  She will also continue to come ot exercise and education to continue to boost her strength and stamina.  - Continue with meds, exercise plan and nutrition to maintain risk factor control.  -      Core Components/Risk Factors/Patient Goals at Discharge (Final Review):  Goals and Risk Factor Review - 11/26/15 0723      Core Components/Risk Factors/Patient Goals Review   Personal Goals Review Increase knowledge of respiratory medications and ability to use respiratory devices properly.;Develop more efficient breathing techniques  such as purse lipped breathing and diaphragmatic breathing and practicing self-pacing with activity.;Improve shortness of breath with ADL's;Sedentary;Increase Strength and Stamina   Review Ms Vandivier states she is feeling stronger and doing more walk around their house. The other day she mopped their hardwood floor in the kitchen - which she had not done for time time. She still has good and bad days with her shortness of breath, but with pacing and PLB, she manages the breathing well.      ITP Comments:   Comments: 30 Day Review

## 2015-12-20 ENCOUNTER — Encounter: Payer: PPO | Admitting: *Deleted

## 2015-12-20 DIAGNOSIS — J449 Chronic obstructive pulmonary disease, unspecified: Secondary | ICD-10-CM

## 2015-12-20 NOTE — Progress Notes (Signed)
Daily Session Note  Patient Details  Name: Rosabelle Jupin MRN: [id number] Date of Birth: 02-May-1945 Referring Provider:   Flowsheet Row Pulmonary Rehab from 09/17/2015 in [facility] Cardiac and Pulmonary Rehab  Referring Provider  Clayborn Bigness      Encounter Date: 12/20/2015  Check In:     Session Check In - 12/20/15 1240      Check-In   Staff Present Heath Lark, RN, BSN, CCRP;Carroll Enterkin, RN, Michaela Corner, RRT, RCP, Respiratory Therapist   Supervising physician immediately available to respond to emergencies [facility] immediately available ER MD   Physician(s) Drs: Corky Downs and Burlene Arnt   Medication changes reported     No   Fall or balance concerns reported    No   Warm-up and Cool-down Performed on first and last piece of equipment   Resistance Training Performed Yes   VAD Patient? No     Pain Assessment   Currently in Pain? No/denies         Goals Met:  Exercise tolerated well Strength training completed today  Goals Unmet:  Not Applicable  Comments: Doing well with exercise prescription progression.    Dr. Emily Filbert is Medical Director for [place] and [facility] Pulmonary Rehabilitation.

## 2015-12-27 ENCOUNTER — Encounter: Payer: PPO | Admitting: Respiratory Therapy

## 2015-12-27 DIAGNOSIS — J449 Chronic obstructive pulmonary disease, unspecified: Secondary | ICD-10-CM | POA: Diagnosis not present

## 2015-12-27 NOTE — Progress Notes (Signed)
Daily Session Note  Patient Details  Name: Joyce Rodriguez MRN: [id number] Date of Birth: [birth year] Referring Provider:   Flowsheet Row Pulmonary Rehab from 09/17/2015 in [facility] Cardiac and Pulmonary Rehab  Referring Provider  Clayborn Bigness      Encounter Date: 12/27/2015  Check In:     Session Check In - 12/27/15 1059      Check-In   Location [facility]-Cardiac & Pulmonary Rehab   Staff Present Heath Lark, RN, BSN, CCRP;Brei Pociask Blanch Media, RRT, RCP, Respiratory Therapist;Carroll Enterkin, RN, BSN   Supervising physician immediately available to respond to emergencies [facility] immediately available ER MD   Physician(s) Dr. Quentin Cornwall & McShane   Medication changes reported     No   Fall or balance concerns reported    No   Warm-up and Cool-down Performed on first and last piece of equipment   Resistance Training Performed Yes   VAD Patient? No     Pain Assessment   Currently in Pain? No/denies         Goals Met:  Proper associated with RPD/PD & O2 Sat Independence with exercise equipment Using PLB without cueing & demonstrates good technique Exercise tolerated well Strength training completed today  Goals Unmet:  Not Applicable  Comments: Pt able to follow exercise prescription today without complaint.  Will continue to monitor for progression.    Dr. Emily Filbert is Medical Director for [place] and [facility] Pulmonary Rehabilitation.

## 2015-12-30 DIAGNOSIS — J449 Chronic obstructive pulmonary disease, unspecified: Secondary | ICD-10-CM

## 2015-12-30 NOTE — Progress Notes (Signed)
Daily Session Note  Patient Details  Name: Joyce Rodriguez MRN: [id number] Date of Birth: October 25, 1945 Referring Provider:   Flowsheet Row Pulmonary Rehab from 09/17/2015 in [facility] Cardiac and Pulmonary Rehab  Referring Provider  Clayborn Bigness      Encounter Date: 12/30/2015  Check In:     Session Check In - 12/30/15 1149      Check-In   Location [facility]-Cardiac & Pulmonary Rehab   Staff Present Earlean Shawl, BS, ACSM CEP, Exercise Physiologist;Amanda Oletta Darter, BA, ACSM CEP, Exercise Physiologist;Laureen Owens Shark, BS, RRT, Respiratory Therapist   Supervising physician immediately available to respond to emergencies [facility] immediately available ER MD   Physician(s) Elenore Rota   Medication changes reported     No   Fall or balance concerns reported    No   Warm-up and Cool-down Performed as group-led instruction   Resistance Training Performed Yes   VAD Patient? No     Pain Assessment   Currently in Pain? No/denies   Multiple Pain Sites No         Goals Met:  Proper associated with RPD/PD & O2 Sat Independence with exercise equipment Exercise tolerated well Strength training completed today  Goals Unmet:  Not Applicable  Comments: Pt able to follow exercise prescription today without complaint.  Will continue to monitor for progression.    Dr. Emily Filbert is Medical Director for [place] and [facility] Pulmonary Rehabilitation.

## 2016-01-01 DIAGNOSIS — J441 Chronic obstructive pulmonary disease with (acute) exacerbation: Secondary | ICD-10-CM | POA: Diagnosis not present

## 2016-01-03 ENCOUNTER — Encounter: Payer: PPO | Admitting: *Deleted

## 2016-01-03 DIAGNOSIS — J449 Chronic obstructive pulmonary disease, unspecified: Secondary | ICD-10-CM | POA: Diagnosis not present

## 2016-01-03 NOTE — Progress Notes (Signed)
Daily Session Note  Patient Details  Name: Joyce Rodriguez MRN: [id number] Date of Birth: Aug 28, 1945 Referring Provider:   Flowsheet Row Pulmonary Rehab from 09/17/2015 in [facility] Cardiac and Pulmonary Rehab  Referring Provider  Clayborn Bigness      Encounter Date: 01/03/2016  Check In:     Session Check In - 01/03/16 1059      Check-In   Location [facility]-Cardiac & Pulmonary Rehab   Staff Present Gerlene Burdock, RN, BSN;Stacey Blanch Media, RRT, RCP, Respiratory Therapist;Susanne Bice, RN, BSN, CCRP   Supervising physician immediately available to respond to emergencies [facility] immediately available ER MD   Physician(s) Dr. Mariea Clonts and Dr. Clearnce Hasten   Medication changes reported     No   Fall or balance concerns reported    No   Warm-up and Cool-down Performed as group-led instruction   Resistance Training Performed Yes   VAD Patient? No     Pain Assessment   Currently in Pain? No/denies         Goals Met:  Proper associated with RPD/PD & O2 Sat Exercise tolerated well  Goals Unmet:  Not Applicable  Comments:     Dr. Emily Filbert is Medical Director for [place] and [facility] Pulmonary Rehabilitation.

## 2016-01-06 ENCOUNTER — Encounter: Payer: PPO | Admitting: *Deleted

## 2016-01-06 DIAGNOSIS — J449 Chronic obstructive pulmonary disease, unspecified: Secondary | ICD-10-CM

## 2016-01-06 NOTE — Progress Notes (Signed)
Daily Session Note  Patient Details  Name: Deliah Strehlow MRN: [id number] Date of Birth: 09-20-1945 Referring Provider:   Flowsheet Row Pulmonary Rehab from 09/17/2015 in [facility] Cardiac and Pulmonary Rehab  Referring Provider  Clayborn Bigness      Encounter Date: 01/06/2016  Check In:     Session Check In - 01/06/16 1211      Check-In   Location [facility]-Cardiac & Pulmonary Rehab   Staff Present Earlean Shawl, BS, ACSM CEP, Exercise Physiologist;Amanda Oletta Darter, BA, ACSM CEP, Exercise Physiologist;Laureen Janell Quiet, RRT, Respiratory Therapist   Supervising physician immediately available to respond to emergencies [facility] immediately available ER MD   Physician(s) Dr. Mariea Clonts and Dr. Clearnce Hasten   Medication changes reported     No   Fall or balance concerns reported    No   Warm-up and Cool-down Performed on first and last piece of equipment   Resistance Training Performed Yes   VAD Patient? No     Pain Assessment   Currently in Pain? No/denies   Multiple Pain Sites No         Goals Met:  Proper associated with RPD/PD & O2 Sat Independence with exercise equipment Exercise tolerated well Strength training completed today  Goals Unmet:  Not Applicable  Comments: Pt able to follow exercise prescription today without complaint.  Will continue to monitor for progression.    Dr. Emily Filbert is Medical Director for [place] and [facility]monary Rehabilitation.

## 2016-01-13 ENCOUNTER — Encounter: Payer: PPO | Attending: Internal Medicine | Admitting: *Deleted

## 2016-01-13 ENCOUNTER — Encounter: Payer: Self-pay | Admitting: Respiratory Therapy

## 2016-01-13 VITALS — Ht 63.0 in | Wt 140.8 lb

## 2016-01-13 DIAGNOSIS — J449 Chronic obstructive pulmonary disease, unspecified: Secondary | ICD-10-CM | POA: Diagnosis not present

## 2016-01-13 NOTE — Progress Notes (Signed)
Pulmonary Individual Treatment Plan  Patient Details  Name: Joyce Rodriguez MRN: [id number] Date of Birth: December 30, 1945 Referring Provider:   Flowsheet Row Pulmonary Rehab from 09/17/2015 in [facility] Cardiac and Pulmonary Rehab  Referring Provider  Clayborn Bigness      Initial Encounter Date:  Flowsheet Row Pulmonary Rehab from 09/17/2015 in [facility] Cardiac and Pulmonary Rehab  Date  09/17/15  Referring Provider  Clayborn Bigness      Visit Diagnosis: COPD, moderate ([place])  Patient's Home Medications on Admission:  Current Outpatient Prescriptions:    albuterol (PROVENTIL HFA;VENTOLIN HFA) 108 (90 BASE) MCG/ACT inhaler, Inhale 2 puffs into the lungs every 6 (six) hours as needed for shortness of breath., Disp: , Rfl:    aspirin EC 81 MG tablet, Take 81 mg by mouth daily., Disp: , Rfl:    escitalopram (LEXAPRO) 20 MG tablet, Take 1 tablet (20 mg total) by mouth daily., Disp: 90 tablet, Rfl: 2   Fluticasone-Salmeterol (ADVAIR) 250-50 MCG/DOSE AEPB, Inhale 1 puff into the lungs 2 (two) times daily., Disp: , Rfl:    losartan (COZAAR) 100 MG tablet, Take 1 tablet (100 mg total) by mouth daily., Disp: 90 tablet, Rfl: 2   pantoprazole (PROTONIX) 40 MG tablet, Take 1 tablet (40 mg total) by mouth daily., Disp: 90 tablet, Rfl: 2   raloxifene (EVISTA) 60 MG tablet, Take 1 tablet (60 mg total) by mouth daily., Disp: 90 tablet, Rfl: 2   rosuvastatin (CRESTOR) 20 MG tablet, Take 1 tablet (20 mg total) by mouth daily., Disp: 90 tablet, Rfl: 2   tiotropium (SPIRIVA) 18 MCG inhalation capsule, Place 18 mcg into inhaler and inhale daily., Disp: , Rfl:    traMADol (ULTRAM) 50 MG tablet, 1-2 tabs q6 hours as needed for pain, Disp: 100 tablet, Rfl: 0  Past Medical History: Past Medical History:  Diagnosis Date   COPD (chronic obstructive pulmonary disease) ([place])    Hyperlipidemia    Osteoporosis     Tobacco Use: History  Smoking Status   Former Smoker   Types: Cigarettes   Quit date: 03/18/2001   Smokeless Tobacco   Never Used    Labs: Recent Review Flowsheet Data    Labs for ITP Cardiac and Pulmonary Rehab Latest Ref Rng & Units 03/13/2014 09/12/2014 03/19/2015 11/07/2015   Cholestrol <200 mg/dL 269(A) 168 179 165   LDLCALC 0 - 99 mg/dL 151 67 76 -   HDL >39 mg/dL 89(A) 84 84 -   Trlycerides <150 mg/dL 144 86 96 94       ADL UCSD:     Pulmonary Assessment Scores    Row Name 09/17/15 0900 11/08/15 1051       ADL UCSD   ADL Phase Entry Mid    SOB Score total 27 44    Rest 0 0    Walk 2 3    Stairs 3 4    Bath 2 1    Dress 1 2    Shop 2 2       Pulmonary Function Assessment:     Pulmonary Function Assessment - 09/17/15 0900      Pulmonary Function Tests   RV% 167 %   DLCO% 28 %     Initial Spirometry Results   FVC% 61 %   FEV1% 36 %   FEV1/FVC Ratio 46     Post Bronchodilator Spirometry Results   FVC% 68 %   FEV1% 37 %   FEV1/FVC Ratio 43     Breath   Bilateral Breath  Sounds Decreased;Clear   Shortness of Breath Yes;Fear of Shortness of Breath;Limiting activity      Exercise Target Goals:    Exercise Program Goal: Individual exercise prescription set with THRR, safety & activity barriers. Participant demonstrates ability to understand and report RPE using BORG scale, to self-measure pulse accurately, and to acknowledge the importance of the exercise prescription.  Exercise Prescription Goal: Starting with aerobic activity 30 plus minutes a day, 3 days per week for initial exercise prescription. Provide home exercise prescription and guidelines that participant acknowledges understanding prior to discharge.  Activity Barriers & Risk Stratification:     Activity Barriers & Cardiac Risk Stratification - 09/17/15 0900      Activity Barriers & Cardiac Risk Stratification   Activity Barriers Shortness of Breath;Deconditioning;Muscular Weakness   Cardiac Risk Stratification Moderate      6 Minute Walk:     6 Minute Walk    Row Name  09/17/15 1536 11/08/15 1051       6 Minute Walk   Phase  -- Mid Program    Distance 858 feet 940 feet    Distance % Change  -- 9.5 %    Walk Time 6 minutes 6 minutes    # of Rest Breaks 0 0    MPH 1.63 1.78    METS 2.48 2.46    RPE 13 13    Perceived Dyspnea   -- 3    VO2 Peak 8.7 8.6    Symptoms No No    Resting HR 100 bpm 88 bpm    Resting BP 118/66 122/70    Max Ex. HR 120 bpm 120 bpm    Max Ex. BP 132/68 110/76    2 Minute Post BP  -- 100/70      Interval HR   Baseline HR 100 88    1 Minute HR 110 106    2 Minute HR 114 112    3 Minute HR 116 110    4 Minute HR 118 110    5 Minute HR 120 119    6 Minute HR 118 120    2 Minute Post HR 106 103    Interval Heart Rate? Yes Yes      Interval Oxygen   Interval Oxygen? Yes Yes    Baseline Oxygen Saturation % 92 % 93 %    Baseline Liters of Oxygen 1 L 0 L  Room Air    1 Minute Oxygen Saturation % 95 % 93 %    1 Minute Liters of Oxygen 1 L 0 L    2 Minute Oxygen Saturation % 93 % 87 %    2 Minute Liters of Oxygen 1 L 0 L    3 Minute Oxygen Saturation % 91 % 84 %    3 Minute Liters of Oxygen 1 L 0 L    4 Minute Oxygen Saturation % 88 % 82 %    4 Minute Liters of Oxygen 1 L 0 L    5 Minute Oxygen Saturation % 88 % 84 %    5 Minute Liters of Oxygen 1 L 2 L    6 Minute Oxygen Saturation % 88 % 84 %    6 Minute Liters of Oxygen 1 L 2 L    2 Minute Post Oxygen Saturation % 94 % 97 %    2 Minute Post Liters of Oxygen 1 L 2 L       Initial Exercise Prescription:     Initial  Exercise Prescription - 09/17/15 1500      Date of Initial Exercise RX and Referring Provider   Date 09/17/15   Referring Provider Clayborn Bigness     Oxygen   Oxygen Continuous   Liters 1     Treadmill   MPH 1.5   Grade 0   Minutes 15   METs 2.4     NuStep   Level 3   Watts 30   Minutes 15   METs 2.4     Recumbant Elliptical   Level 1   RPM 50   Watts 30   Minutes 15   METs 2.4     Biostep-RELP   Level 3   Watts 30   Minutes  15   METs 2.4     Prescription Details   Frequency (times per week) 3-5   Duration Progress to 45 minutes of aerobic exercise without signs/symptoms of physical distress     Intensity   THRR 40-80% of Max Heartrate 120-141   Ratings of Perceived Exertion 11-15   Perceived Dyspnea 0-4     Progression   Progression Continue to progress workloads to maintain intensity without signs/symptoms of physical distress.     Resistance Training   Training Prescription Yes   Weight 2   Reps 10-12      Perform Capillary Blood Glucose checks as needed.  Exercise Prescription Changes:     Exercise Prescription Changes    Row Name 09/23/15 1200 10/01/15 1500 10/16/15 1500 10/30/15 1000 11/08/15 1000     Exercise Review   Progression  -- Yes Yes Yes Yes     Response to Exercise   Blood Pressure (Admit)  -- 124/80 120/68 110/64  --   Blood Pressure (Exercise)  -- 160/84 148/82 120/66  --   Blood Pressure (Exit)  -- 102/60 100/70 114/64  --   Heart Rate (Admit)  -- 94 bpm 92 bpm 86 bpm  --   Heart Rate (Exercise)  -- 118 bpm 120 bpm 119 bpm  --   Heart Rate (Exit)  -- 102 bpm 103 bpm 103 bpm  --   Oxygen Saturation (Admit)  -- 95 % 91 % 92 %  --   Oxygen Saturation (Exercise)  -- 88 % 89 % 94 %  --   Oxygen Saturation (Exit)  -- 96 % 97 % 96 %  --   Rating of Perceived Exertion (Exercise)  -- _0 --   Perceived Dyspnea (Exercise)  -- _1 --   Symptoms  -- shortness of breath on treadmill  -- shortness of breath on treadmill shortness of breath on treadmill   Comments  --  --  --  -- Home Exercise Guidelines given 11/04/15   Duration  -- Progress to 45 minutes of aerobic exercise without signs/symptoms of physical distress Progress to 45 minutes of aerobic exercise without signs/symptoms of physical distress Progress to 45 minutes of aerobic exercise without signs/symptoms of physical distress Progress to 45 minutes of aerobic exercise without signs/symptoms of physical distress    Intensity  -- THRR unchanged THRR unchanged THRR unchanged THRR unchanged     Progression   Progression  -- Continue to progress workloads to maintain intensity without signs/symptoms of physical distress. Continue to progress workloads to maintain intensity without signs/symptoms of physical distress. Continue to progress workloads to maintain intensity without signs/symptoms of physical distress. Continue to progress workloads to maintain intensity without signs/symptoms of physical distress.  Average METs  -- 2.1 2.8 2.7 2.7     Resistance Training   Training Prescription _0    Weight _1 Reps 10-12 10-12 10-12 10-12 10-12     Interval Training   Interval Training  -- No No No No     Oxygen   Oxygen _2    Liters _3 Treadmill   MPH 1.2 1.2 1.4 1.4 1.4   Grade 0 0 0 0.5 0.5   Minutes _4 METs 2.4 1.92 2.07 2.17 2.17     NuStep   Level _5 Watts 25 27 36  --  --   Minutes _6 METs 2.4 2 2.7 2.9 2.9     Recumbant Elliptical   Level 1  omitted today due to time  --  --  --  --   RPM 50  --  --  --  --   Watts 30  --  --  --  --   Minutes 15  --  --  --  --   METs 2.4  --  --  --  --     Biostep-RELP   Level _7 Watts _8 --  --   Minutes _9 METs 2.4 2 2._10 Home Exercise Plan   Plans to continue exercise at  --  -- Home  walking in pool at home Home  walking in pool at home Home  walking in pool at home   Frequency  --  -- Add 2 additional days to program exercise sessions. Add 2 additional days to program exercise sessions. Add 2 additional days to program exercise sessions.   [place] Name 11/13/15 1500 11/27/15 1400 12/11/15 1500 12/24/15 1500 12/30/15 1100     Exercise Review   Progression Yes Yes Yes Yes  --     Response to Exercise   Blood Pressure (Admit) 118/64 110/68 102/74 118/60  --   Blood Pressure  (Exercise) 140/80 144/70 138/70 152/70  --   Blood Pressure (Exit) 124/70 110/70 106/64 106/62  --   Heart Rate (Admit) 88 bpm 85 bpm 93 bpm 92 bpm  --   Heart Rate (Exercise) 134 bpm 128 bpm 128 bpm 139 bpm  --   Heart Rate (Exit) 108 bpm 91 bpm 107 bpm 90 bpm  --   Oxygen Saturation (Admit) 93 % 92 % 92 % 93 %  --   Oxygen Saturation (Exercise) 92 % 87 % 91 % 88 %  --   Oxygen Saturation (Exit) 95 % 95 % 97 % 92 %  --   Rating of Perceived Exertion (Exercise) _11 --   Perceived Dyspnea (Exercise) _12 --   Symptoms shortness of breath on treadmill shortness of breath on treadmill shortness of breath on treadmill shortness of breath on treadmill  --   Comments Home Exercise Guidelines given 11/04/15 Home Exercise Guidelines given 11/04/15 Home Exercise Guidelines given 11/04/15 Home Exercise Guidelines given 11/04/15  --   Duration Progress to 45 minutes of aerobic exercise without signs/symptoms of physical distress Progress to 45 minutes of aerobic exercise without signs/symptoms of physical distress Progress to 45 minutes of aerobic  exercise without signs/symptoms of physical distress Progress to 45 minutes of aerobic exercise without signs/symptoms of physical distress  --   Intensity THRR unchanged THRR unchanged THRR unchanged THRR unchanged  --     Progression   Progression Continue to progress workloads to maintain intensity without signs/symptoms of physical distress. Continue to progress workloads to maintain intensity without signs/symptoms of physical distress. Continue to progress workloads to maintain intensity without signs/symptoms of physical distress. Continue to progress workloads to maintain intensity without signs/symptoms of physical distress.  --   Average METs 2.62 2.7 2.78 2.45  --     Resistance Training   Training Prescription Yes Yes Yes Yes  --   Weight 2 3 lbs 3 lbs  2 lbs left hand 3 lbs  2 lbs left hand  --   Reps 10-12 10-15 10-15 10-15  --      Interval Training   Interval Training No No No No  --     Oxygen   Oxygen Continuous Continuous Continuous Continuous  --   Liters _0 3L on treadmill 2  --     Treadmill   MPH 1.4 1.5 1.5 1.5  --   Grade 0.5 0.5 0.5 1  --   Minutes _1 --   METs 2.17 2.2 2.25 2.36  --     NuStep   Level _2 --   Minutes _3 --   METs 2.7 2.9 3.1 2.6  --     Biostep-RELP   Level _4 --   Minutes _5 --   METs _6 --     Home Exercise Plan   Plans to continue exercise at Home  walking in pool at home Home  walking in pool at home Home  walking in pool at home Home  walking in pool at home Home  has equipment at home   Frequency Add 2 additional days to program exercise sessions. Add 2 additional days to program exercise sessions. Add 2 additional days to program exercise sessions. Add 2 additional days to program exercise sessions. Add 2 additional days to program exercise sessions.   Sharp Name 01/08/16 1400             Exercise Review   Progression Yes         Response to Exercise   Blood Pressure (Admit) 112/70       Blood Pressure (Exercise) 138/78       Blood Pressure (Exit) 116/64       Heart Rate (Admit) 81 bpm       Heart Rate (Exercise) 121 bpm       Heart Rate (Exit) 106 bpm       Oxygen Saturation (Admit) 92 %       Oxygen Saturation (Exercise) 93 %       Oxygen Saturation (Exit) 94 %       Rating of Perceived Exertion (Exercise) 11       Perceived Dyspnea (Exercise) 3       Duration Progress to 45 minutes of aerobic exercise without signs/symptoms of physical distress       Intensity THRR unchanged         Progression   Progression Continue to progress workloads to maintain intensity without signs/symptoms of physical distress.       Average METs 2.5  Resistance Training   Training Prescription Yes       Weight 3       Reps 10-15         Interval Training   Interval Training No         Oxygen   Oxygen  Continuous       Liters 2         Treadmill   MPH 1.8       Grade 1       Minutes 15         NuStep   Level 6       Minutes 15         Biostep-RELP   Level 5       Minutes 15          Exercise Comments:     Exercise Comments    Row Name 09/17/15 1605 09/23/15 1213 10/01/15 1551 10/14/15 1222 10/16/15 1515   Exercise Comments Pt did not report any musculoskeletal issues - expected to progress well with exercise.  Noticed that she felt better during 6 min walk with 1 L contiuous oxygen. First full day of exercise!  Patient was oriented to gym and equipment including functions, settings, policies, and procedures.  Patient's individual exercise prescription and treatment plan were reviewed.  All starting workloads were established based on the results of the 6 minute walk test done at initial orientation visit.  The plan for exercise progression was also introduced and progression will be customized based on patient's performance and goals. Lakeshia is off to a good start with her rehab.  She seems to be enjoying coming to class.  She has already progressed to a full 15 min on the treadmill!  We will continue to work with her to increase her workloads to build up strength and stamina. Hudson has reported that she is having less SOB with daily activities. She has been feeling like she has more energy and has been walking in her pool at home on the days that she does not come to class.  Aleksia has been doing great with exercise.  She continues to make good progression and we hope she will continue to do so.   Row Name 10/30/15 1040 11/04/15 1157 11/08/15 1054 11/13/15 1524 11/27/15 1444   Exercise Comments Ger continues to do well with exercise.  She is walking in the pool at home on her off days.  We will continue to monitor for progress. Reviewed home exercise with pt today.  Pt plans to walk in pool and at home for exercise.  She is planning to get a home gym and stationary bike to use as well.   Reviewed THR, pulse, RPE, sign and symptoms, pulse oximetry, and when to call 911 or MD.  Also discussed weather considerations and indoor options.  Pt voiced understanding. Regan completed her mid program 6 min walk test today.  She improved by 31f.  She started her walk on room air, but did desaturate and finished on 2L.  She has been exercising with 2L. EShatariacontinues to do well with exercise.  She is up to level 4 on the BioStep.  We will continue to monitor for progress. EKamorais making good progress with exercise.  She is walking at home and is up to level 5 on the BioStep.  We will continue to monitor for progression.   RCharlotte HarborName 12/11/15 1526 12/24/15 1520 12/30/15 1151 01/08/16 1410     Exercise Comments  Edgar continues to do well with exercise.  She is up to level 6 now on the stepper.  We will continue to monitor for progression. Remedios has been doing well with exercise.  She has not been feeling 100% this week.  We will continue to monitor for progression. Luevenia has purchased a stationary bike to use at home.  Home exercise was discussed incl heart rate RPE and safety. Elwyn continues to progress well with exercise.       Discharge Exercise Prescription (Final Exercise Prescription Changes):     Exercise Prescription Changes - 01/08/16 1400      Exercise Review   Progression Yes     Response to Exercise   Blood Pressure (Admit) 112/70   Blood Pressure (Exercise) 138/78   Blood Pressure (Exit) 116/64   Heart Rate (Admit) 81 bpm   Heart Rate (Exercise) 121 bpm   Heart Rate (Exit) 106 bpm   Oxygen Saturation (Admit) 92 %   Oxygen Saturation (Exercise) 93 %   Oxygen Saturation (Exit) 94 %   Rating of Perceived Exertion (Exercise) 11   Perceived Dyspnea (Exercise) 3   Duration Progress to 45 minutes of aerobic exercise without signs/symptoms of physical distress   Intensity THRR unchanged     Progression   Progression Continue to progress workloads to maintain intensity without  signs/symptoms of physical distress.   Average METs 2.5     Resistance Training   Training Prescription Yes   Weight 3   Reps 10-15     Interval Training   Interval Training No     Oxygen   Oxygen Continuous   Liters 2     Treadmill   MPH 1.8   Grade 1   Minutes 15     NuStep   Level 6   Minutes 15     Biostep-RELP   Level 5   Minutes 15       Nutrition:  Target Goals: Understanding of nutrition guidelines, daily intake of sodium <1520m, cholesterol <2050m calories 30% from fat and 7% or less from saturated fats, daily to have 5 or more servings of fruits and vegetables.  Biometrics:     Pre Biometrics - 09/17/15 1608      Pre Biometrics   Height 5' 3" (1.6 m)   Weight 142 lb (64.4 kg)   Waist Circumference 36 inches   Hip Circumference 41.25 inches   Waist to Hip Ratio 0.87 %   BMI (Calculated) 25.2       Nutrition Therapy Plan and Nutrition Goals:     Nutrition Therapy & Goals - 09/17/15 0900      Nutrition Therapy   Diet Prefers not to see the dietitian; Husband cooks healthy meals with vegetables; she does not use salt, but does like sweets; her gopal is to loss 5lbs.      Nutrition Discharge: Rate Your Plate Scores:   Psychosocial: Target Goals: Acknowledge presence or absence of depression, maximize coping skills, provide positive support system. Participant is able to verbalize types and ability to use techniques and skills needed for reducing stress and depression.  Initial Review & Psychosocial Screening:     Initial Psych Review & Screening - 09/17/15 0900      Family Dynamics   Good Support System? Yes   Comments Ms CaBarrellas good support from her children and husband. She states she has no depression, although she does miss walking distances without shortness of breath and is looking forward to partivccipation in  [employer].     Barriers   Psychosocial barriers to participate in program The patient should benefit from  training in stress management and relaxation.;There are no identifiable barriers or psychosocial needs.     Screening Interventions   Interventions Encouraged to exercise      Quality of Life Scores:     Quality of Life - 09/17/15 0900      Quality of Life Scores   Health/Function Pre 20.19 %   Socioeconomic Pre 26 %   Psych/Spiritual Pre 24.86 %   Family Pre 27.6 %   GLOBAL Pre 23.26 %      PHQ-9: Recent Review Flowsheet Data    Depression screen [facility] 2/9 09/17/2015 09/12/2014   Decreased Interest 0 0   Down, Depressed, Hopeless 0 0   PHQ - 2 Score 0 0   Altered sleeping 0 -   Tired, decreased energy 1 -   Change in appetite 0 -   Feeling bad or failure about yourself  0 -   Trouble concentrating 0 -   Moving slowly or fidgety/restless 0 -   Suicidal thoughts 0 -   PHQ-9 Score 1 -   Difficult doing work/chores Somewhat difficult -      Psychosocial Evaluation and Intervention:     Psychosocial Evaluation - 09/30/15 1116      Psychosocial Evaluation & Interventions   Interventions Encouraged to exercise with the program and follow exercise prescription   Comments Counselor met with Ms. Goodridge today for initial psychosocial evaluation.  She is a 70 year old who has been diagnosed with COPD.  Ms. Witts has a strong support system with a spouse of 25 years and (3) adult children who live close by.  She reports sleeping "okay" for the most part and occasionally has to take an OTC sleep aid.  She denies a history of depression or anxiety, although she states her daughter was concerned about her at one point and insisted she get on Rx for symptoms.  So she has been taking Rx  for her mood for many years and denies any current symptoms.  She states her mood now is typically positive and she has minimal stress in her life.  Her goals for this program are to improve her breathing and increase her energy to be able to do more and travel more with her husband who just retired.   Counselor will follow with Ms. Wrightson throughout the course of this program.        Psychosocial Re-Evaluation:     Psychosocial Re-Evaluation    Row Name 10/14/15 1144 11/11/15 1114 11/25/15 1145 01/13/16 0726       Psychosocial Re-Evaluation   Comments Counselor follow up with Ms. Stapleton reporting she is "surprised" how much she is enjoying this class and how much progress she is already experiencing.  She stated she was able to walk 25 laps in her pool this past weekend and her family have mentioned how much stronger her voice sounds on the phone lately.  Ms. Apachito  maintains a positive attitude and reports minimal stress in her life at this time.  Counselor will continue to follow with Ms. Castello and commended her on her progress and commitment to consistency in exercising, both here and at home.   Counselor follow up with Ms. Megan Salon.  She stated she was walking better and breathing better since coming into this class.  She was able to go to the beach recently for a short vacation and  states she was amazed at how well she did during that trip.  Her mood continues to be positive.  She is contending with some muscle tension in her neck with her Dr. recommending a massage in the near future.  Counselor will follow with Ms. Galambos during the course of treatment in thie program.   Counselor follow up with Ms. Castrogiovanni reporting continued positive results from this program.  She states she "loves it" and is able to do so much more now since beginning.  She even "cleaned her wood floors" this past week and her spouse was amazed.  Her mood is typically positive and her Dr. states "all is well."  She recently began to have difficulty with getting to sleep and counselor encouraged a natural OTC sleep aid if this does not improve as well as speaking to Dr. or Pharmacist.  Counselor will continue to follow with Ms. Megan Salon.   Ms Rossel is nearing graduation. She has learned to pace herself with  family requests and to say "no" if too shortof breath. Through exercise and COPD education, Ms Burlison has added qualtity to her life.       Education: Education Goals: Education classes will be provided on a weekly basis, covering required topics. Participant will state understanding/return demonstration of topics presented.  Learning Barriers/Preferences:     Learning Barriers/Preferences - 09/17/15 0900      Learning Barriers/Preferences   Learning Barriers None   Learning Preferences Group Instruction;Individual Instruction;Pictoral;Skilled Demonstration;Verbal Instruction;Video;Written Material      Education Topics: Initial Evaluation Education: - Verbal, written and demonstration of respiratory meds, RPE/PD scales, oximetry and breathing techniques. Instruction on use of nebulizers and MDIs: cleaning and proper use, rinsing mouth with steroid doses and importance of monitoring MDI activations. Flowsheet Row Pulmonary Rehab from 12/27/2015 in [facility] Cardiac and Pulmonary Rehab  Date  09/17/15  Educator  LB  Instruction Review Code  2- meets goals/outcomes      General Nutrition Guidelines/Fats and Fiber: -Group instruction provided by verbal, written material, models and posters to present the general guidelines for heart healthy nutrition. Gives an explanation and review of dietary fats and fiber. Flowsheet Row Pulmonary Rehab from 12/27/2015 in [facility] Cardiac and Pulmonary Rehab  Date  11/04/15  Educator  CR  Instruction Review Code  2- meets goals/outcomes      Controlling Sodium/Reading Food Labels: -Group verbal and written material supporting the discussion of sodium use in heart healthy nutrition. Review and explanation with models, verbal and written materials for utilization of the food label. Flowsheet Row Pulmonary Rehab from 12/27/2015 in [facility] Cardiac and Pulmonary Rehab  Date  09/30/15  Educator  CR  Instruction Review Code  2- meets goals/outcomes       Exercise Physiology & Risk Factors: - Group verbal and written instruction with models to review the exercise physiology of the cardiovascular system and associated critical values. Details cardiovascular disease risk factors and the goals associated with each risk factor.   Aerobic Exercise & Resistance Training: - Gives group verbal and written discussion on the health impact of inactivity. On the components of aerobic and resistive training programs and the benefits of this training and how to safely progress through these programs.   Flexibility, Balance, General Exercise Guidelines: - Provides group verbal and written instruction on the benefits of flexibility and balance training programs. Provides general exercise guidelines with specific guidelines to those with heart or lung disease. Demonstration and skill practice provided.   Stress Management: -  Provides group verbal and written instruction about the health risks of elevated stress, cause of high stress, and healthy ways to reduce stress.   Depression: - Provides group verbal and written instruction on the correlation between heart/lung disease and depressed mood, treatment options, and the stigmas associated with seeking treatment.   Exercise & Equipment Safety: - Individual verbal instruction and demonstration of equipment use and safety with use of the equipment. Flowsheet Row Pulmonary Rehab from 12/27/2015 in [facility] Cardiac and Pulmonary Rehab  Date  09/17/15  Educator  LB  Instruction Review Code  2- meets goals/outcomes      Infection Prevention: - Provides verbal and written material to individual with discussion of infection control including proper hand washing and proper equipment cleaning during exercise session. Flowsheet Row Pulmonary Rehab from 12/27/2015 in [facility] Cardiac and Pulmonary Rehab  Date  09/17/15  Educator  LB  Instruction Review Code  2- meets goals/outcomes      Falls Prevention: -  Provides verbal and written material to individual with discussion of falls prevention and safety. Flowsheet Row Pulmonary Rehab from 12/27/2015 in [facility] Cardiac and Pulmonary Rehab  Date  09/17/15  Educator  LB  Instruction Review Code  2- meets goals/outcomes      Diabetes: - Individual verbal and written instruction to review signs/symptoms of diabetes, desired ranges of glucose level fasting, after meals and with exercise. Advice that pre and post exercise glucose checks will be done for 3 sessions at entry of program.   Chronic Lung Diseases: - Group verbal and written instruction to review new updates, new respiratory medications, new advancements in procedures and treatments. Provide informative websites and "800" numbers of self-education. Flowsheet Row Pulmonary Rehab from 12/27/2015 in [facility] Cardiac and Pulmonary Rehab  Date  10/28/15  Educator  LB  Instruction Review Code  2- meets goals/outcomes      Lung Procedures: - Group verbal and written instruction to describe testing methods done to diagnose lung disease. Review the outcome of test results. Describe the treatment choices: Pulmonary Function Tests, ABGs and oximetry. Flowsheet Row Pulmonary Rehab from 12/27/2015 in [facility] Cardiac and Pulmonary Rehab  Date  11/01/15  Educator  Erline Levine  Instruction Review Code  2- meets goals/outcomes      Energy Conservation: - Provide group verbal and written instruction for methods to conserve energy, plan and organize activities. Instruct on pacing techniques, use of adaptive equipment and posture/positioning to relieve shortness of breath.   Triggers: - Group verbal and written instruction to review types of environmental controls: home humidity, furnaces, filters, dust mite/pet prevention, HEPA vacuums. To discuss weather changes, air quality and the benefits of nasal washing.   Exacerbations: - Group verbal and written instruction to provide: warning signs, infection symptoms,  calling MD promptly, preventive modes, and value of vaccinations. Review: effective airway clearance, coughing and/or vibration techniques. Create an Sports administrator. Flowsheet Row Pulmonary Rehab from 12/27/2015 in [facility] Cardiac and Pulmonary Rehab  Date  10/07/15  Educator  LB  Instruction Review Code  2- meets goals/outcomes      Oxygen: - Individual and group verbal and written instruction on oxygen therapy. Includes supplement oxygen, available portable oxygen systems, continuous and intermittent flow rates, oxygen safety, concentrators, and Medicare reimbursement for oxygen. Flowsheet Row Pulmonary Rehab from 12/27/2015 in [facility] Cardiac and Pulmonary Rehab  Date  09/17/15  Educator  LB  Instruction Review Code  2- meets goals/outcomes      Respiratory Medications: - Group verbal and written instruction  to review medications for lung disease. Drug class, frequency, complications, importance of spacers, rinsing mouth after steroid MDI's, and proper cleaning methods for nebulizers. Flowsheet Row Pulmonary Rehab from 12/27/2015 in [facility] Cardiac and Pulmonary Rehab  Date  09/17/15  Educator  LB  Instruction Review Code  2- meets goals/outcomes      AED/CPR: - Group verbal and written instruction with the use of models to demonstrate the basic use of the AED with the basic ABC's of resuscitation. Flowsheet Row Pulmonary Rehab from 12/27/2015 in [facility] Cardiac and Pulmonary Rehab  Date  11/15/15  Educator  CE  Instruction Review Code  2- meets goals/outcomes      Breathing Retraining: - Provides individuals verbal and written instruction on purpose, frequency, and proper technique of diaphragmatic breathing and pursed-lipped breathing. Applies individual practice skills. Flowsheet Row Pulmonary Rehab from 12/27/2015 in [facility] Cardiac and Pulmonary Rehab  Date  09/17/15  Educator  LB  Instruction Review Code  2- meets goals/outcomes      Anatomy and Physiology of the Lungs: - Group verbal  and written instruction with the use of models to provide basic lung anatomy and physiology related to function, structure and complications of lung disease. Flowsheet Row Pulmonary Rehab from 12/27/2015 in [facility] Cardiac and Pulmonary Rehab  Date  10/04/15  Educator  [place]  Instruction Review Code  2- meets goals/outcomes      Heart Failure: - Group verbal and written instruction on the basics of heart failure: signs/symptoms, treatments, explanation of ejection fraction, enlarged heart and cardiomyopathy. Flowsheet Row Pulmonary Rehab from 12/27/2015 in [facility] Cardiac and Pulmonary Rehab  Date  10/11/15 [facility] your numbers]  Educator  C. EnterkinRN  Instruction Review Code  2- meets goals/outcomes      Sleep Apnea: - Individual verbal and written instruction to review Obstructive Sleep Apnea. Review of risk factors, methods for diagnosing and types of masks and machines for OSA.   Anxiety: - Provides group, verbal and written instruction on the correlation between heart/lung disease and anxiety, treatment options, and management of anxiety. Flowsheet Row Pulmonary Rehab from 12/27/2015 in [facility] Cardiac and Pulmonary Rehab  Date  10/23/15  Educator  [facility]  Instruction Review Code  2- Meets goals/outcomes      Relaxation: - Provides group, verbal and written instruction about the benefits of relaxation for patients with heart/lung disease. Also provides patients with examples of relaxation techniques.   Knowledge Questionnaire Score:     Knowledge Questionnaire Score - 09/17/15 0900      Knowledge Questionnaire Score   Pre Score 7/10       Core Components/Risk Factors/Patient Goals at Admission:     Personal Goals and Risk Factors at Admission - 09/17/15 0900      Core Components/Risk Factors/Patient Goals on Admission   Sedentary Yes  Ms Arth's goal is to increase her walking. she may plan to walk in their pool this summer.   Intervention Provide advice, education, support  and counseling about physical activity/exercise needs.;Develop an individualized exercise prescription for aerobic and resistive training based on initial evaluation findings, risk stratification, comorbidities and participant's personal goals.   Expected Outcomes Achievement of increased cardiorespiratory fitness and enhanced flexibility, muscular endurance and strength shown through measurements of functional capacity and personal statement of participant.   Increase Strength and Stamina Yes   Intervention Provide advice, education, support and counseling about physical activity/exercise needs.;Develop an individualized exercise prescription for aerobic and resistive training based on initial evaluation findings, risk stratification, comorbidities  and participant's personal goals.   Expected Outcomes Achievement of increased cardiorespiratory fitness and enhanced flexibility, muscular endurance and strength shown through measurements of functional capacity and personal statement of participant.   Improve shortness of breath with ADL's Yes   Intervention Provide education, individualized exercise plan and daily activity instruction to help decrease symptoms of SOB with activities of daily living.   Expected Outcomes Short Term: Achieves a reduction of symptoms when performing activities of daily living.   Develop more efficient breathing techniques such as purse lipped breathing and diaphragmatic breathing; and practicing self-pacing with activity Yes   Intervention Provide education, demonstration and support about specific breathing techniuqes utilized for more efficient breathing. Include techniques such as pursed lipped breathing, diaphragmatic breathing and self-pacing activity.   Expected Outcomes Short Term: Participant will be able to demonstrate and use breathing techniques as needed throughout daily activities.   Increase knowledge of respiratory medications and ability to use respiratory  devices properly  Yes  Uses oxygen for sleep and activity - 1l/m; she needs to be encouraged to use with activity on continuous flow; uses Advair, Spiriva, Albuterol MDIs and SVN with Albuterol.   Intervention Provide education and demonstration as needed of appropriate use of medications, inhalers, and oxygen therapy.   Expected Outcomes Short Term: Achieves understanding of medications use. Understands that oxygen is a medication prescribed by physician. Demonstrates appropriate use of inhaler and oxygen therapy.   Hypertension Yes   Intervention Provide education on lifestyle modifcations including regular physical activity/exercise, weight management, moderate sodium restriction and increased consumption of fresh fruit, vegetables, and low fat dairy, alcohol moderation, and smoking cessation.;Monitor prescription use compliance.   Expected Outcomes Short Term: Continued assessment and intervention until BP is < 140/37m HG in hypertensive participants. < 130/831mHG in hypertensive participants with diabetes, heart failure or chronic kidney disease.;Long Term: Maintenance of blood pressure at goal levels.      Core Components/Risk Factors/Patient Goals Review:      Goals and Risk Factor Review    Row Name 09/23/15 1213 09/30/15 1000 10/07/15 1000 10/10/15 1318 10/10/15 1329     Core Components/Risk Factors/Patient Goals Review   Personal Goals Review Develop more efficient breathing techniques such as purse lipped breathing and diaphragmatic breathing and practicing self-pacing with activity. Increase knowledge of respiratory medications and ability to use respiratory devices properly. Develop more efficient breathing techniques such as purse lipped breathing and diaphragmatic breathing and practicing self-pacing with activity.;Improve shortness of breath with ADL's Hypertension;Lipids Hypertension;Lipids   Review Pursed lip breathing techniques were discussed and demonstrated with patient.  Patient demonstrated understanding of these techniques. Ms CaCloydad a go day on the  treadmill and maintained her O2Sat's in the 90's which reassured and gave her more confidence in using the treadmill. she recognizes the importance of increasing her oxygen to 2l/m continuous and how this can help her with activity. Ms CaLesmeistertates she is not using her Albuterol Inhaler, only occasionally, instead she is using PLB. She is very aware of weather and her breathing and stays inside on "bad air" days. and does a good job managing her shortness of breath . ElArwaontinues to maintain good BP control.  She continues to take her hypertension and cholesterol medications as ordered. ElKristalynnontinues to maintain good BP control.  She continues to take her hypertension and cholesterol medications as ordered.   Expected Outcomes Patient will independenly use pursed lip breathing techniques to help tolerate exercise.  Continue progressing with oxygen use  with her exercise goals. Continue using PLB to manage her COPD. COntinued control of her risk factors through meds, exerse and nutrion plan. COntinued control of her risk factors through meds, exerse and nutrion plan.   [place] Name 10/14/15 1615 11/04/15 1157 11/04/15 1529 11/22/15 1123 11/26/15 0723     Core Components/Risk Factors/Patient Goals Review   Personal Goals Review Sedentary Sedentary;Improve shortness of breath with ADL's;Increase Strength and Stamina Hypertension;Develop more efficient breathing techniques such as purse lipped breathing and diaphragmatic breathing and practicing self-pacing with activity.;Increase knowledge of respiratory medications and ability to use respiratory devices properly.;Improve shortness of breath with ADL's Lipids;Hypertension Increase knowledge of respiratory medications and ability to use respiratory devices properly.;Develop more efficient breathing techniques such as purse lipped breathing and diaphragmatic breathing and  practicing self-pacing with activity.;Improve shortness of breath with ADL's;Sedentary;Increase Strength and Stamina   Review Ms Leske plans to continue her exercise after [facility]. Her husband is currently looking for a piece of exercise equipment they can use at home. Chessa has not been doing much exercise at home due to the weather recently.  She is getting in the pool when she is able and is planning to buy some equipment for home use.  She has noticed that she is able to do more at home with improved SOB and strength and stamina. Ms Turski has maintained acceptable BP's at rest and with exercise. She has a good understanding of her MDI's and is very compliant in taking them everyday. She uses PLB during her exercise goals and activities at home. She  has noticed an improvement in her shortness of breath with activites at home and does adjust her oxygen as needed with activity from 2 to 3l/m. BP readings remain in good levels. Recent blod panel done,reports Cholesterol nu mbers are good.  Izadora continues to be compliant with  her meds, exercise and nutriotion plan.  She has needed to drecrease her BP med by half, her MD is aware. Ms Hinkson states she is feeling stronger and doing more walk around their house. The other day she mopped their hardwood floor in the kitchen - which she had not done for time time. She still has good and bad days with her shortness of breath, but with pacing and PLB, she manages the breathing well.   Expected Outcomes Continue exercising with exercise equipment at home. Reviewed home exercise guidelines with Deyra today to add in more at home.  She will also continue to come ot exercise and education to continue to boost her strength and stamina.  -- Continue with meds, exercise plan and nutrition to maintain risk factor control.  --   Row Name 12/30/15 1152 12/30/15 1414 01/03/16 1451 01/03/16 1454 01/03/16 1456     Core Components/Risk Factors/Patient Goals Review    Personal Goals Review Hypertension;Increase Strength and Stamina;Improve shortness of breath with ADL's Hypertension Increase knowledge of respiratory medications and ability to use respiratory devices properly. Improve shortness of breath with ADL's Develop more efficient breathing techniques such as purse lipped breathing and diaphragmatic breathing and practicing self-pacing with activity.   Review Anahli reports imporved stamina with daily activities.  Her Dr reduced her BP med by half due to her lifestyle change of adding exercise.  -- Ms. Stella takes Albuterol MDI & SVN, Spiriva and Advair for her respiratory issues.  She has a spacer for her inhaler.  She knows to rinse her mouth after taking Advair.  She also wears 1L O2 during the day and  2L O2 while exercising. Ms. Leiber states that she has noticed that she is able to walk with less SOB and is able to do more housework since she has been exercising in pulmonary rehabilation. Ms. Willow demonstrates proper technique of PLB.    Expected Outcomes  -- Leyana will continue to have BP readings within normal limits by continuing the lifestyle change of exercise. Ms. Tebbetts will decrease her chances of exacerbations and SOB if she continues to take her respiratory medications as directed by her doctor.  Continued strength training and cardiovascular exercise sould help Mr. Ericson have less SOB while performing ADL's. PLB will help Ms. Cegielski regain her breath during exercise.       Core Components/Risk Factors/Patient Goals at Discharge (Final Review):      Goals and Risk Factor Review - 01/03/16 1456      Core Components/Risk Factors/Patient Goals Review   Personal Goals Review Develop more efficient breathing techniques such as purse lipped breathing and diaphragmatic breathing and practicing self-pacing with activity.   Review Ms. Mungin demonstrates proper technique of PLB.    Expected Outcomes PLB will help Ms. Mcgraw regain her  breath during exercise.       ITP Comments:     ITP Comments    Row Name 01/08/16 1411           ITP Comments Sharri continues to progress well with exercise.          Comments: 30 day note review

## 2016-01-13 NOTE — Progress Notes (Signed)
Daily Session Note  Patient Details  Name: Joyce Rodriguez MRN: [id number] Date of Birth: October 22, 1945 Referring Provider:   Flowsheet Row Pulmonary Rehab from 09/17/2015 in [facility] Cardiac and Pulmonary Rehab  Referring Provider  Clayborn Bigness      Encounter Date: 01/13/2016  Check In:     Session Check In - 01/13/16 1012      Check-In   Location [facility]-Cardiac & Pulmonary Rehab   Staff Present Carson Myrtle, BS, RRT, Respiratory Bertis Ruddy, BS, ACSM CEP, Exercise Physiologist;Amanda Oletta Darter, BA, ACSM CEP, Exercise Physiologist   Supervising physician immediately available to respond to emergencies [facility] immediately available ER MD   Physician(s) Dr. Jacqualine Code and Dr. Reita Cliche   Medication changes reported     No   Fall or balance concerns reported    No   Warm-up and Cool-down Performed on first and last piece of equipment   Resistance Training Performed Yes   VAD Patient? No     Pain Assessment   Currently in Pain? No/denies   Multiple Pain Sites No         Goals Met:  Proper associated with RPD/PD & O2 Sat Independence with exercise equipment Exercise tolerated well Strength training completed today  Personal goals reviewed and 6 min walk test done  Goals Unmet:  Not Applicable  Comments: Pt able to follow exercise prescription today without complaint.  Will continue to monitor for progression.      [place] Name 09/17/15 1536 11/08/15 1051 01/13/16 1053     6 Minute Walk   Phase  - Mid Program Discharge   Distance 858 feet 940 feet 920 feet   Distance % Change  - 9.5 % 7 %   Walk Time 6 minutes 6 minutes 6 minutes   # of Rest Breaks 0 0 0   MPH 1.63 1.78 1.74   METS 2.48 2.46 2.8   RPE _0 Perceived Dyspnea   - 3 3   VO2 Peak 8.7 8.6 9.8   Symptoms No No No   Resting HR 100 bpm 88 bpm 92 bpm   Resting BP 118/66 122/70 112/64   Max Ex. HR 120 bpm 120 bpm 126 bpm   Max Ex. BP 132/68 110/76 146/72   2 Minute Post BP  - 100/70 130/70      Interval HR   Baseline HR 100 88 92   1 Minute HR 110 106 118   2 Minute HR 114 112 121   3 Minute HR 116 110 122   4 Minute HR 118 110 126   5 Minute HR 120 119 126   6 Minute HR 118 120 122   2 Minute Post HR 106 103 104   Interval Heart Rate? Yes Yes Yes     Interval Oxygen   Interval Oxygen? Yes Yes Yes   Baseline Oxygen Saturation % 92 % 93 % 94 %   Baseline Liters of Oxygen 1 L 0 L  Room Air 2 L   1 Minute Oxygen Saturation % 95 % 93 % 91 %   1 Minute Liters of Oxygen 1 L 0 L 2 L   2 Minute Oxygen Saturation % 93 % 87 % 94 %   2 Minute Liters of Oxygen 1 L 0 L 2 L   3 Minute Oxygen Saturation % 91 % 84 % 91 %   3 Minute Liters of Oxygen 1 L 0 L 2 L  4 Minute Oxygen Saturation % 88 % 82 % 91 %   4 Minute Liters of Oxygen 1 L 0 L 2 L   5 Minute Oxygen Saturation % 88 % 84 % 90 %   5 Minute Liters of Oxygen 1 L 2 L 2 L   6 Minute Oxygen Saturation % 88 % 84 % 88 %   6 Minute Liters of Oxygen 1 L 2 L 2 L   2 Minute Post Oxygen Saturation % 94 % 97 % 95 %   2 Minute Post Liters of Oxygen 1 L 2 L 2 L         Dr. Emily Filbert is Medical Director for [facility] and [facility] Pulmonary Rehabilitation.

## 2016-01-17 ENCOUNTER — Encounter: Payer: PPO | Admitting: *Deleted

## 2016-01-17 DIAGNOSIS — J449 Chronic obstructive pulmonary disease, unspecified: Secondary | ICD-10-CM

## 2016-01-17 NOTE — Progress Notes (Signed)
Daily Session Note  Patient Details  Name: Joyce Rodriguez MRN: [id number] Date of Birth: 11-11-1945 Referring Provider:   Flowsheet Row Pulmonary Rehab from 09/17/2015 in [facility] Cardiac and Pulmonary Rehab  Referring Provider  Clayborn Bigness      Encounter Date: 01/17/2016  Check In:     Session Check In - 01/17/16 1036      Check-In   Location [facility]-Cardiac & Pulmonary Rehab   Staff Present Carson Myrtle, BS, RRT, Respiratory Therapist;Suleyma Wafer, RN, Levie Heritage, MA, ACSM RCEP, Exercise Physiologist   Supervising physician immediately available to respond to emergencies [facility] immediately available ER MD   Physician(s) Dr. Izola Price and Dr. Reita Cliche   Medication changes reported     No   Fall or balance concerns reported    No   Warm-up and Cool-down Performed as group-led instruction   Resistance Training Performed Yes   VAD Patient? No     Pain Assessment   Currently in Pain? No/denies         Goals Met:  Proper associated with RPD/PD & O2 Sat Exercise tolerated well  Goals Unmet:  Not Applicable  Comments:     Dr. Emily Filbert is Medical Director for [place] and [facility] Pulmonary Rehabilitation.

## 2016-01-20 ENCOUNTER — Encounter: Payer: PPO | Admitting: *Deleted

## 2016-01-20 DIAGNOSIS — J449 Chronic obstructive pulmonary disease, unspecified: Secondary | ICD-10-CM | POA: Diagnosis not present

## 2016-01-20 NOTE — Progress Notes (Signed)
Daily Session Note  Patient Details  Name: Joyce Rodriguez MRN: [id number] Date of Birth: 05-16-1945 Referring Provider:   Flowsheet Row Pulmonary Rehab from 09/17/2015 in [facility] Cardiac and Pulmonary Rehab  Referring Provider  Clayborn Bigness      Encounter Date: 01/20/2016  Check In:     Session Check In - 01/20/16 1121      Check-In   Location [facility]-Cardiac & Pulmonary Rehab   Staff Present Carson Myrtle, BS, RRT, Respiratory Bertis Ruddy, BS, ACSM CEP, Exercise Physiologist;Amanda Oletta Darter, BA, ACSM CEP, Exercise Physiologist   Supervising physician immediately available to respond to emergencies [facility] immediately available ER MD   Physician(s) Dr. Burlene Arnt and Dr. Marcelene Butte   Medication changes reported     No   Fall or balance concerns reported    No   Warm-up and Cool-down Performed on first and last piece of equipment   Resistance Training Performed Yes   VAD Patient? No     Pain Assessment   Currently in Pain? No/denies   Multiple Pain Sites No         Goals Met:  Proper associated with RPD/PD & O2 Sat Independence with exercise equipment Exercise tolerated well Strength training completed today  Goals Unmet:  Not Applicable  Comments: Pt able to follow exercise prescription today without complaint.  Will continue to monitor for progression.    Dr. Emily Filbert is Medical Director for [place] and [facility].

## 2016-01-24 DIAGNOSIS — J449 Chronic obstructive pulmonary disease, unspecified: Secondary | ICD-10-CM

## 2016-01-24 NOTE — Progress Notes (Signed)
Daily Session Note  Patient Details  Name: Joyce Rodriguez MRN: [id number] Date of Birth: 06/15/1945 Referring Provider:   Flowsheet Row Pulmonary Rehab from 09/17/2015 in [facility] Card[facility] and [facility] Re[facility]  Referring Provider  Khan, fozia      Encounter Date: 01/24/2016  Check In:     Session Check In - 01/24/16 1152      Check-In   Location [facility]   Staff Present Susanne Bice, RN, BSN, CCRP;Carroll Enterkin, RN, BSN;Amanda Sommer, BA, ACSM CEP, Exercise Physiologist   Supervising physician immediately available to respond to emergencies [facility] immediately available ER MD   Physician(s) Kinner and Goodman   Medication changes reported     No   Fall or balance concerns reported    No   Warm-up and Cool-down Performed as group-led instruction   Resistance Training Performed Yes   VAD Patient? No     Pain Assessment   Currently in Pain? No/denies   Multiple Pain Sites No           Exercise Prescription Changes - 01/23/16 1200      Exercise Review   Progression Yes     Response to Exercise   Blood Pressure (Admit) 120/68   Blood Pressure (Exercise) 140/68   Blood Pressure (Exit) 124/82   Heart Rate (Admit) 88 bpm   Heart Rate (Exercise) 127 bpm   Heart Rate (Exit) 107 bpm   Oxygen Saturation (Admit) 93 %   Oxygen Saturation (Exercise) 91 %   Oxygen Saturation (Exit) 94 %   Rating of Perceived Exertion (Exercise) 11   Perceived Dyspnea (Exercise) 3   Duration Progress to 45 minutes of aerobic exercise without signs/symptoms of physical distress   Intensity THRR unchanged     Progression   Progression Continue to progress workloads to maintain intensity without signs/symptoms of physical distress.   Average METs 2.9     Resistance Training   Training Prescription Yes   Weight 3   Reps 10-15     Interval Training   Interval Training No     Oxygen   Oxygen Continuous   Liters 2     Treadmill   MPH 1.8   Grade 1   Minutes 15   METs 2.63     NuStep   Level 6   Minutes 15   METs 3.2     Biostep-RELP   Level 5   Minutes 15      Goals Met:  Proper associated with RPD/PD & O2 Sat Independence with exercise equipment Exercise tolerated well Strength training completed today  Goals Unmet:  Not Applicable  Comments: Pt able to follow exercise prescription today without complaint.  Will continue to monitor for progression.    Dr. Mark Miller is Medical Director for [facility] and [facility]. 

## 2016-01-27 ENCOUNTER — Encounter: Payer: PPO | Admitting: *Deleted

## 2016-01-27 DIAGNOSIS — J449 Chronic obstructive pulmonary disease, unspecified: Secondary | ICD-10-CM | POA: Diagnosis not present

## 2016-01-27 NOTE — Progress Notes (Signed)
Daily Session Note  Patient Details  Name: Joyce Rodriguez MRN: [id number] Date of Birth: 1945-05-02 Referring Provider:   Flowsheet Row Pulmonary Rehab from 09/17/2015 in [facility] Card[facility] Pulmonary Rehab  Referring Provider  Clayborn Bigness      Encounter Date: 01/27/2016  Check In:     Session Check In - 01/27/16 1009      Check-In   Location [facility] Pul[facility] Re[facility]   Staff Present Earlean Shawl, BS, ACSM CEP, Exercise Physiologist;Amanda Oletta Darter, BA, ACSM CEP, Exercise Physiologist;Laureen Owens Shark, BS, RRT, Respiratory Therapist   Supervising physician immediately available to respond to emergencies [facility] immediately available ER MD   Physician(s) Joni Fears and Jimmye Norman   Medication changes reported     No   Fall or balance concerns reported    No   Warm-up and Cool-down Performed on first and last piece of equipment   Resistance Training Performed Yes   VAD Patient? No     Pain Assessment   Currently in Pain? No/denies   Multiple Pain Sites No         Goals Met:  Proper associated with RPD/PD & O2 Sat Independence with exercise equipment Exercise tolerated well Strength training completed today  Goals Unmet:  Not Applicable  Comments: Pt able to follow exercise prescription today without complaint.  Will continue to monitor for progression.    Dr. Emily Filbert is Medical Director for [place] and [facility].

## 2016-01-28 NOTE — Progress Notes (Signed)
Discharge Summary  Patient Details  Name: Joyce Rodriguez MRN: [id number] Date of Birth: 1945/04/30 Referring Provider:   Flowsheet Row Pulmonary Rehab from 09/17/2015 in [facility] Re[facility]  Referring Provider  Clayborn Bigness       Number of Visits: 36  Reason for Discharge:  Patient reached a stable level of exercise. Patient independent in their exercise.  Smoking History:  History  Smoking Status   Former Smoker   Types: Cigarettes   Quit date: 03/18/2001  Smokeless Tobacco   Never Used    Diagnosis:  COPD, moderate ([place])  ADL [facility]:     Pulmonary Assessment Scores    Row Name 09/17/15 0900 11/08/15 1051 01/20/16 0743     ADL UCSD   ADL Phase Entry Mid Exit   SOB Score total 27 44 39   Rest 0 0 0   Walk 2 3 0   Stairs 3 4 4    Bath 2 1 2    Dress 1 2 1    Shop 2 2 2       Initial Exercise Prescription:     Initial Exercise Prescription - 09/17/15 1500      Date of Initial Exercise RX and Referring Provider   Date 09/17/15   Referring Provider Clayborn Bigness     Oxygen   Oxygen Continuous   Liters 1     Treadmill   MPH 1.5   Grade 0   Minutes 15   METs 2.4     NuStep   Level 3   Watts 30   Minutes 15   METs 2.4     Recumbant Elliptical   Level 1   RPM 50   Watts 30   Minutes 15   METs 2.4     Biostep-RELP   Level 3   Watts 30   Minutes 15   METs 2.4     Prescription Details   Frequency (times per week) 3-5   Duration Progress to 45 minutes of aerobic exercise without signs/symptoms of physical distress     Intensity   THRR 40-80% of Max Heartrate 120-141   Ratings of Perceived Exertion 11-15   Perceived Dyspnea 0-4     Progression   Progression Continue to progress workloads to maintain intensity without signs/symptoms of physical distress.     Resistance Training   Training Prescription Yes   Weight 2   Reps 10-12      Discharge Exercise Prescription (Final Exercise Prescription Changes):     Exercise  Prescription Changes - 01/23/16 1200      Exercise Review   Progression Yes     Response to Exercise   Blood Pressure (Admit) 120/68   Blood Pressure (Exercise) 140/68   Blood Pressure (Exit) 124/82   Heart Rate (Admit) 88 bpm   Heart Rate (Exercise) 127 bpm   Heart Rate (Exit) 107 bpm   Oxygen Saturation (Admit) 93 %   Oxygen Saturation (Exercise) 91 %   Oxygen Saturation (Exit) 94 %   Rating of Perceived Exertion (Exercise) 11   Perceived Dyspnea (Exercise) 3   Duration Progress to 45 minutes of aerobic exercise without signs/symptoms of physical distress   Intensity THRR unchanged     Progression   Progression Continue to progress workloads to maintain intensity without signs/symptoms of physical distress.   Average METs 2.9     Resistance Training   Training Prescription Yes   Weight 3   Reps 10-15     Interval Training  Interval Training No     Oxygen   Oxygen Continuous   Liters 2     Treadmill   MPH 1.8   Grade 1   Minutes 15   METs 2.63     NuStep   Level 6   Minutes 15   METs 3.2     Biostep-RELP   Level 5   Minutes 15      Functional Capacity:     6 Minute Walk    Row Name 09/17/15 1536 11/08/15 1051 01/13/16 1053     6 Minute Walk   Phase  -- Mid Program Discharge   Distance 858 feet 940 feet 920 feet   Distance % Change  -- 9.5 % 7 %   Walk Time 6 minutes 6 minutes 6 minutes   # of Rest Breaks 0 0 0   MPH 1.63 1.78 1.74   METS 2.48 2.46 2.8   RPE 13 13 13    Perceived Dyspnea   -- 3 3   VO2 Peak 8.7 8.6 9.8   Symptoms No No No   Resting HR 100 bpm 88 bpm 92 bpm   Resting BP 118/66 122/70 112/64   Max Ex. HR 120 bpm 120 bpm 126 bpm   Max Ex. BP 132/68 110/76 146/72   2 Minute Post BP  -- 100/70 130/70     Interval HR   Baseline HR 100 88 92   1 Minute HR 110 106 118   2 Minute HR 114 112 121   3 Minute HR 116 110 122   4 Minute HR 118 110 126   5 Minute HR 120 119 126   6 Minute HR 118 120 122   2 Minute Post HR 106 103  104   Interval Heart Rate? Yes Yes Yes     Interval Oxygen   Interval Oxygen? Yes Yes Yes   Baseline Oxygen Saturation % 92 % 93 % 94 %   Baseline Liters of Oxygen 1 L 0 L  Room Air 2 L   1 Minute Oxygen Saturation % 95 % 93 % 91 %   1 Minute Liters of Oxygen 1 L 0 L 2 L   2 Minute Oxygen Saturation % 93 % 87 % 94 %   2 Minute Liters of Oxygen 1 L 0 L 2 L   3 Minute Oxygen Saturation % 91 % 84 % 91 %   3 Minute Liters of Oxygen 1 L 0 L 2 L   4 Minute Oxygen Saturation % 88 % 82 % 91 %   4 Minute Liters of Oxygen 1 L 0 L 2 L   5 Minute Oxygen Saturation % 88 % 84 % 90 %   5 Minute Liters of Oxygen 1 L 2 L 2 L   6 Minute Oxygen Saturation % 88 % 84 % 88 %   6 Minute Liters of Oxygen 1 L 2 L 2 L   2 Minute Post Oxygen Saturation % 94 % 97 % 95 %   2 Minute Post Liters of Oxygen 1 L 2 L 2 L      Psychological, QOL, Others - Outcomes: PHQ 2/9: Depression screen [facility] 2/9 01/20/2016 09/17/2015 09/12/2014  Decreased Interest 0 0 0  Down, Depressed, Hopeless 0 0 0  PHQ - 2 Score 0 0 0  Altered sleeping 1 0 -  Tired, decreased energy 1 1 -  Change in appetite 0 0 -  Feeling bad or failure  about yourself  0 0 -  Trouble concentrating 0 0 -  Moving slowly or fidgety/restless 0 0 -  Suicidal thoughts 0 0 -  PHQ-9 Score 2 1 -  Difficult doing work/chores Not difficult at all Somewhat difficult -    Quality of Life:     Quality of Life - 01/20/16 0744      Quality of Life Scores   Health/Function Pre 20.19 %   Health/Function Post 17.78 %   Health/Function % Change -11.94 %   Socioeconomic Pre 26 %   Socioeconomic Post 22.57 %   Socioeconomic % Change  -13.19 %   Psych/Spiritual Pre 24.86 %   Psych/Spiritual Post 24.43 %   Psych/Spiritual % Change -1.73 %   Family Pre 27.6 %   Family Post 29.5 %   Family % Change 6.88 %   GLOBAL Pre 23.26 %   GLOBAL Post 21.74 %   GLOBAL % Change -6.53 %      Personal Goals: Goals established at orientation with interventions provided to  work toward goal.     Personal Goals and Risk Factors at Admission - 09/17/15 0900      Core Components/Risk Factors/Patient Goals on Admission   Sedentary Yes  Joyce Rodriguez's goal is to increase her walking. she may plan to walk in their pool this summer.   Intervention Provide advice, education, support and counseling about physical activity/exercise needs.;Develop an individualized exercise prescription for aerobic and resistive training based on initial evaluation findings, risk stratification, comorbidities and participant's personal goals.   Expected Outcomes Achievement of increased cardiorespiratory fitness and enhanced flexibility, muscular endurance and strength shown through measurements of functional capacity and personal statement of participant.   Increase Strength and Stamina Yes   Intervention Provide advice, education, support and counseling about physical activity/exercise needs.;Develop an individualized exercise prescription for aerobic and resistive training based on initial evaluation findings, risk stratification, comorbidities and participant's personal goals.   Expected Outcomes Achievement of increased cardiorespiratory fitness and enhanced flexibility, muscular endurance and strength shown through measurements of functional capacity and personal statement of participant.   Improve shortness of breath with ADL's Yes   Intervention Provide education, individualized exercise plan and daily activity instruction to help decrease symptoms of SOB with activities of daily living.   Expected Outcomes Short Term: Achieves a reduction of symptoms when performing activities of daily living.   Develop more efficient breathing techniques such as purse lipped breathing and diaphragmatic breathing; and practicing self-pacing with activity Yes   Intervention Provide education, demonstration and support about specific breathing techniuqes utilized for more efficient breathing. Include  techniques such as pursed lipped breathing, diaphragmatic breathing and self-pacing activity.   Expected Outcomes Short Term: Participant will be able to demonstrate and use breathing techniques as needed throughout daily activities.   Increase knowledge of respiratory medications and ability to use respiratory devices properly  Yes  Uses oxygen for sleep and activity - 1l/m; she needs to be encouraged to use with activity on continuous flow; uses Advair, Spiriva, Albuterol MDIs and SVN with Albuterol.   Intervention Provide education and demonstration as needed of appropriate use of medications, inhalers, and oxygen therapy.   Expected Outcomes Short Term: Achieves understanding of medications use. Understands that oxygen is a medication prescribed by physician. Demonstrates appropriate use of inhaler and oxygen therapy.   Hypertension Yes   Intervention Provide education on lifestyle modifcations including regular physical activity/exercise, weight management, moderate sodium restriction and increased consumption of fresh fruit,  vegetables, and low fat dairy, alcohol moderation, and smoking cessation.;Monitor prescription use compliance.   Expected Outcomes Short Term: Continued assessment and intervention until BP is < 140/47mm HG in hypertensive participants. < 130/24mm HG in hypertensive participants with diabetes, heart failure or chronic kidney disease.;Long Term: Maintenance of blood pressure at goal levels.       Personal Goals Discharge:     Goals and Risk Factor Review    Row Name 09/23/15 1213 09/30/15 1000 10/07/15 1000 10/10/15 1318 10/10/15 1329     Core Components/Risk Factors/Patient Goals Review   Personal Goals Review Develop more efficient breathing techniques such as purse lipped breathing and diaphragmatic breathing and practicing self-pacing with activity. Increase knowledge of respiratory medications and ability to use respiratory devices properly. Develop more efficient  breathing techniques such as purse lipped breathing and diaphragmatic breathing and practicing self-pacing with activity.;Improve shortness of breath with ADL's Hypertension;Lipids Hypertension;Lipids   Review Pursed lip breathing techniques were discussed and demonstrated with patient. Patient demonstrated understanding of these techniques. Joyce Jakus had a go day on the  treadmill and maintained her O2Sat's in the 90's which reassured and gave her more confidence in using the treadmill. she recognizes the importance of increasing her oxygen to 2l/m continuous and how this can help her with activity. Joyce Milanovich states she is not using her Albuterol Inhaler, only occasionally, instead she is using PLB. She is very aware of weather and her breathing and stays inside on "bad air" days. and does a good job managing her shortness of breath . Donell continues to maintain good BP control.  She continues to take her hypertension and cholesterol medications as ordered. Canice continues to maintain good BP control.  She continues to take her hypertension and cholesterol medications as ordered.   Expected Outcomes Patient will independenly use pursed lip breathing techniques to help tolerate exercise.  Continue progressing with oxygen use with her exercise goals. Continue using PLB to manage her COPD. COntinued control of her risk factors through meds, exerse and nutrion plan. COntinued control of her risk factors through meds, exerse and nutrion plan.   [place] Name 10/14/15 1615 11/04/15 1157 11/04/15 1529 11/22/15 1123 11/26/15 0723     Core Components/Risk Factors/Patient Goals Review   Personal Goals Review Sedentary Sedentary;Improve shortness of breath with ADL's;Increase Strength and Stamina Hypertension;Develop more efficient breathing techniques such as purse lipped breathing and diaphragmatic breathing and practicing self-pacing with activity.;Increase knowledge of respiratory medications and ability to use  respiratory devices properly.;Improve shortness of breath with ADL's Lipids;Hypertension Increase knowledge of respiratory medications and ability to use respiratory devices properly.;Develop more efficient breathing techniques such as purse lipped breathing and diaphragmatic breathing and practicing self-pacing with activity.;Improve shortness of breath with ADL's;Sedentary;Increase Strength and Stamina   Review Joyce Borsch plans to continue her exercise after [facility]. Her husband is currently looking for a piece of exercise equipment they can use at home. Ameyaa has not been doing much exercise at home due to the weather recently.  She is getting in the pool when she is able and is planning to buy some equipment for home use.  She has noticed that she is able to do more at home with improved SOB and strength and stamina. Joyce Holsey has maintained acceptable BP's at rest and with exercise. She has a good understanding of her MDI's and is very compliant in taking them everyday. She uses PLB during her exercise goals and activities at home. She  has noticed an improvement  in her shortness of breath with activites at home and does adjust her oxygen as needed with activity from 2 to 3l/m. BP readings remain in good levels. Recent blod panel done,reports Cholesterol nu mbers are good.  Velencia continues to be compliant with  her meds, exercise and nutriotion plan.  She has needed to drecrease her BP med by half, her MD is aware. Joyce Langer states she is feeling stronger and doing more walk around their house. The other day she mopped their hardwood floor in the kitchen - which she had not done for time time. She still has good and bad days with her shortness of breath, but with pacing and PLB, she manages the breathing well.   Expected Outcomes Continue exercising with exercise equipment at home. Reviewed home exercise guidelines with Jamalia today to add in more at home.  She will also continue to come ot exercise and  education to continue to boost her strength and stamina.  -- Continue with meds, exercise plan and nutrition to maintain risk factor control.  --   Row Name 12/30/15 1152 12/30/15 1414 01/03/16 1451 01/03/16 1454 01/03/16 1456     Core Components/Risk Factors/Patient Goals Review   Personal Goals Review Hypertension;Increase Strength and Stamina;Improve shortness of breath with ADL's Hypertension Increase knowledge of respiratory medications and ability to use respiratory devices properly. Improve shortness of breath with ADL's Develop more efficient breathing techniques such as purse lipped breathing and diaphragmatic breathing and practicing self-pacing with activity.   Review Margaretta reports imporved stamina with daily activities.  Her Dr reduced her BP med by half due to her lifestyle change of adding exercise.  -- Joyce. Balaguer takes Albuterol MDI & SVN, Spiriva and Advair for her respiratory issues.  She has a spacer for her inhaler.  She knows to rinse her mouth after taking Advair.  She also wears 1L O2 during the day and 2L O2 while exercising. Joyce. Bessant states that she has noticed that she is able to walk with less SOB and is able to do more housework since she has been exercising in pulmonary rehabilation. Joyce. Moala demonstrates proper technique of PLB.    Expected Outcomes  -- Raelea will continue to have BP readings within normal limits by continuing the lifestyle change of exercise. Joyce. Schied will decrease her chances of exacerbations and SOB if she continues to take her respiratory medications as directed by her doctor.  Continued strength training and cardiovascular exercise sould help Mr. Pollok have less SOB while performing ADL's. PLB will help Joyce. Curvin regain her breath during exercise.    [place] Name 01/20/16 0748 01/20/16 0752           Core Components/Risk Factors/Patient Goals Review   Personal Goals Review Increase Strength and Stamina;Sedentary;Develop more efficient  breathing techniques such as purse lipped breathing and diaphragmatic breathing and practicing self-pacing with activity.;Improve shortness of breath with ADL's;Increase knowledge of respiratory medications and ability to use respiratory devices properly. Hypertension      Review Joyce Deutschman is nearing graduation from [employer]. She has improved her breathing - noticeable with housework, walking, and exercise. She uses her PLB with all activity and is very compliant with her MDI's and is knowledgeable about her MDI's. She wants to continue exercise and knows the importance of exercise in managing her COPD.  Joyce Panasuk is compliant with her Blood Pressure medicine and has acceptable BP during [facility]. She also manages her diet and salt.  Nutrition & Weight - Outcomes:     Pre Biometrics - 09/17/15 1608      Pre Biometrics   Height 5\' 3"  (1.6 m)   Weight 142 lb (64.4 kg)   Waist Circumference 36 inches   Hip Circumference 41.25 inches   Waist to Hip Ratio 0.87 %   BMI (Calculated) 25.2         Post Biometrics - 01/13/16 1058       Post  Biometrics   Height 5\' 3"  (1.6 m)   Weight 140 lb 12.8 oz (63.9 kg)   Waist Circumference 34.5 inches   Hip Circumference 41 inches   Waist to Hip Ratio 0.84 %   BMI (Calculated) 25      Nutrition:     Nutrition Therapy & Goals - 09/17/15 0900      Nutrition Therapy   Diet Prefers not to see the dietitian; Husband cooks healthy meals with vegetables; she does not use salt, but does like sweets; her gopal is to loss 5lbs.      Nutrition Discharge:   Education Questionnaire Score:     Knowledge Questionnaire Score - 01/20/16 0743      Knowledge Questionnaire Score   Post Score 9/10      Goals reviewed with patient; copy given to patient.

## 2016-01-28 NOTE — Progress Notes (Signed)
Pulmonary Individual Treatment Plan  Patient Details  Name: Fani Rotondo MRN: [id number] Date of Birth: [birth year] Referring Provider:   Flowsheet Row Pulmonary Rehab from 09/17/2015 in [facility]  Referring Provider  Clayborn Bigness      Initial Encounter Date:  Flowsheet Row Pulmonary Rehab from 09/17/2015 in [facility] Card[facility] and Pulmonary Rehab  Date  09/17/15  Referring Provider  Clayborn Bigness      Visit Diagnosis: COPD, moderate ([place])  Patient's Home Medications on Admission:  Current Outpatient Prescriptions:    albuterol (PROVENTIL HFA;VENTOLIN HFA) 108 (90 BASE) MCG/ACT inhaler, Inhale 2 puffs into the lungs every 6 (six) hours as needed for shortness of breath., Disp: , Rfl:    aspirin EC 81 MG tablet, Take 81 mg by mouth daily., Disp: , Rfl:    escitalopram (LEXAPRO) 20 MG tablet, Take 1 tablet (20 mg total) by mouth daily., Disp: 90 tablet, Rfl: 2   Fluticasone-Salmeterol (ADVAIR) 250-50 MCG/DOSE AEPB, Inhale 1 puff into the lungs 2 (two) times daily., Disp: , Rfl:    losartan (COZAAR) 100 MG tablet, Take 1 tablet (100 mg total) by mouth daily., Disp: 90 tablet, Rfl: 2   pantoprazole (PROTONIX) 40 MG tablet, Take 1 tablet (40 mg total) by mouth daily., Disp: 90 tablet, Rfl: 2   raloxifene (EVISTA) 60 MG tablet, Take 1 tablet (60 mg total) by mouth daily., Disp: 90 tablet, Rfl: 2   rosuvastatin (CRESTOR) 20 MG tablet, Take 1 tablet (20 mg total) by mouth daily., Disp: 90 tablet, Rfl: 2   tiotropium (SPIRIVA) 18 MCG inhalation capsule, Place 18 mcg into inhaler and inhale daily., Disp: , Rfl:    traMADol (ULTRAM) 50 MG tablet, 1-2 tabs q6 hours as needed for pain, Disp: 100 tablet, Rfl: 0  Past Medical History: Past Medical History:  Diagnosis Date   COPD (chronic obstructive pulmonary disease) (HCC)    Hyperlipidemia    Osteoporosis     Tobacco Use: History  Smoking Status   Former Smoker   Types: Cigarettes   Quit date: 03/18/2001   Smokeless Tobacco   Never Used    Labs: Recent Review Flowsheet Data    Labs for ITP Cardiac and Pulmonary Rehab Latest Ref Rng & Units 03/13/2014 09/12/2014 03/19/2015 11/07/2015   Cholestrol <200 mg/dL 269(A) 168 179 165   LDLCALC 0 - 99 mg/dL 151 67 76 -   HDL >39 mg/dL 89(A) 84 84 -   Trlycerides <150 mg/dL 144 86 96 94       ADL UCSD:     Pulmonary Assessment Scores    Row Name 09/17/15 0900 11/08/15 1051 01/20/16 0743     ADL UCSD   ADL Phase Entry Mid Exit   SOB Score total 27 44 39   Rest 0 0 0   Walk 2 3 0   Stairs _0 Bath _1 Dress _2 Shop _3 Pulmonary Function Assessment:     Pulmonary Function Assessment - 09/17/15 0900      Pulmonary Function Tests   RV% 167 %   DLCO% 28 %     Initial Spirometry Results   FVC% 61 %   FEV1% 36 %   FEV1/FVC Ratio 46     Post Bronchodilator Spirometry Results   FVC% 68 %   FEV1% 37 %   FEV1/FVC Ratio 43     Breath   Bilateral Breath  Sounds Decreased;Clear   Shortness of Breath Yes;Fear of Shortness of Breath;Limiting activity      Exercise Target Goals:    Exercise Program Goal: Individual exercise prescription set with THRR, safety & activity barriers. Participant demonstrates ability to understand and report RPE using BORG scale, to self-measure pulse accurately, and to acknowledge the importance of the exercise prescription.  Exercise Prescription Goal: Starting with aerobic activity 30 plus minutes a day, 3 days per week for initial exercise prescription. Provide home exercise prescription and guidelines that participant acknowledges understanding prior to discharge.  Activity Barriers & Risk Stratification:     Activity Barriers & Cardiac Risk Stratification - 09/17/15 0900      Activity Barriers & Cardiac Risk Stratification   Activity Barriers Shortness of Breath;Deconditioning;Muscular Weakness   Cardiac Risk Stratification Moderate      6 Minute Walk:     6 Minute  Walk    Row Name 09/17/15 1536 11/08/15 1051 01/13/16 1053     6 Minute Walk   Phase  -- Mid Program Discharge   Distance 858 feet 940 feet 920 feet   Distance % Change  -- 9.5 % 7 %   Walk Time 6 minutes 6 minutes 6 minutes   # of Rest Breaks 0 0 0   MPH 1.63 1.78 1.74   METS 2.48 2.46 2.8   RPE _0 Perceived Dyspnea   -- 3 3   VO2 Peak 8.7 8.6 9.8   Symptoms No No No   Resting HR 100 bpm 88 bpm 92 bpm   Resting BP 118/66 122/70 112/64   Max Ex. HR 120 bpm 120 bpm 126 bpm   Max Ex. BP 132/68 110/76 146/72   2 Minute Post BP  -- 100/70 130/70     Interval HR   Baseline HR 100 88 92   1 Minute HR 110 106 118   2 Minute HR 114 112 121   3 Minute HR 116 110 122   4 Minute HR 118 110 126   5 Minute HR 120 119 126   6 Minute HR 118 120 122   2 Minute Post HR 106 103 104   Interval Heart Rate? Yes Yes Yes     Interval Oxygen   Interval Oxygen? Yes Yes Yes   Baseline Oxygen Saturation % 92 % 93 % 94 %   Baseline Liters of Oxygen 1 L 0 L  Room Air 2 L   1 Minute Oxygen Saturation % 95 % 93 % 91 %   1 Minute Liters of Oxygen 1 L 0 L 2 L   2 Minute Oxygen Saturation % 93 % 87 % 94 %   2 Minute Liters of Oxygen 1 L 0 L 2 L   3 Minute Oxygen Saturation % 91 % 84 % 91 %   3 Minute Liters of Oxygen 1 L 0 L 2 L   4 Minute Oxygen Saturation % 88 % 82 % 91 %   4 Minute Liters of Oxygen 1 L 0 L 2 L   5 Minute Oxygen Saturation % 88 % 84 % 90 %   5 Minute Liters of Oxygen 1 L 2 L 2 L   6 Minute Oxygen Saturation % 88 % 84 % 88 %   6 Minute Liters of Oxygen 1 L 2 L 2 L   2 Minute Post Oxygen Saturation % 94 % 97 % 95 %   2 Minute Post Liters  of Oxygen 1 L 2 L 2 L      Initial Exercise Prescription:     Initial Exercise Prescription - 09/17/15 1500      Date of Initial Exercise RX and Referring Provider   Date 09/17/15   Referring Provider Clayborn Bigness     Oxygen   Oxygen Continuous   Liters 1     Treadmill   MPH 1.5   Grade 0   Minutes 15   METs 2.4      NuStep   Level 3   Watts 30   Minutes 15   METs 2.4     Recumbant Elliptical   Level 1   RPM 50   Watts 30   Minutes 15   METs 2.4     Biostep-RELP   Level 3   Watts 30   Minutes 15   METs 2.4     Prescription Details   Frequency (times per week) 3-5   Duration Progress to 45 minutes of aerobic exercise without signs/symptoms of physical distress     Intensity   THRR 40-80% of Max Heartrate 120-141   Ratings of Perceived Exertion 11-15   Perceived Dyspnea 0-4     Progression   Progression Continue to progress workloads to maintain intensity without signs/symptoms of physical distress.     Resistance Training   Training Prescription Yes   Weight 2   Reps 10-12      Perform Capillary Blood Glucose checks as needed.  Exercise Prescription Changes:     Exercise Prescription Changes    Row Name 09/23/15 1200 10/01/15 1500 10/16/15 1500 10/30/15 1000 11/08/15 1000     Exercise Review   Progression  -- Yes Yes Yes Yes     Response to Exercise   Blood Pressure (Admit)  -- 124/80 120/68 110/64  --   Blood Pressure (Exercise)  -- 160/84 148/82 120/66  --   Blood Pressure (Exit)  -- 102/60 100/70 114/64  --   Heart Rate (Admit)  -- 94 bpm 92 bpm 86 bpm  --   Heart Rate (Exercise)  -- 118 bpm 120 bpm 119 bpm  --   Heart Rate (Exit)  -- 102 bpm 103 bpm 103 bpm  --   Oxygen Saturation (Admit)  -- 95 % 91 % 92 %  --   Oxygen Saturation (Exercise)  -- 88 % 89 % 94 %  --   Oxygen Saturation (Exit)  -- 96 % 97 % 96 %  --   Rating of Perceived Exertion (Exercise)  -- _0 --   Perceived Dyspnea (Exercise)  -- _1 --   Symptoms  -- shortness of breath on treadmill  -- shortness of breath on treadmill shortness of breath on treadmill   Comments  --  --  --  -- Home Exercise Guidelines given 11/04/15   Duration  -- Progress to 45 minutes of aerobic exercise without signs/symptoms of physical distress Progress to 45 minutes of aerobic exercise without signs/symptoms of  physical distress Progress to 45 minutes of aerobic exercise without signs/symptoms of physical distress Progress to 45 minutes of aerobic exercise without signs/symptoms of physical distress   Intensity  -- THRR unchanged THRR unchanged THRR unchanged THRR unchanged     Progression   Progression  -- Continue to progress workloads to maintain intensity without signs/symptoms of physical distress. Continue to progress workloads to maintain intensity without signs/symptoms of physical distress. Continue to progress workloads to  maintain intensity without signs/symptoms of physical distress. Continue to progress workloads to maintain intensity without signs/symptoms of physical distress.   Average METs  -- 2.1 2.8 2.7 2.7     Resistance Training   Training Prescription _0    Weight _1 Reps 10-12 10-12 10-12 10-12 10-12     Interval Training   Interval Training  -- No No No No     Oxygen   Oxygen _2    Liters _3 Treadmill   MPH 1.2 1.2 1.4 1.4 1.4   Grade 0 0 0 0.5 0.5   Minutes _4 METs 2.4 1.92 2.07 2.17 2.17     NuStep   Level _5 Watts 25 27 36  --  --   Minutes _6 METs 2.4 2 2.7 2.9 2.9     Recumbant Elliptical   Level 1  omitted today due to time  --  --  --  --   RPM 50  --  --  --  --   Watts 30  --  --  --  --   Minutes 15  --  --  --  --   METs 2.4  --  --  --  --     Biostep-RELP   Level _7 Watts _8 --  --   Minutes _9 METs 2.4 2 2._10 Home Exercise Plan   Plans to continue exercise at  --  -- Home  walking in pool at home Home  walking in pool at home Home  walking in pool at home   Frequency  --  -- Add 2 additional days to program exercise sessions. Add 2 additional days to program exercise sessions. Add 2 additional days to program exercise sessions.   [place] Name 11/13/15 1500 11/27/15 1400 12/11/15  1500 12/24/15 1500 12/30/15 1100     Exercise Review   Progression Yes Yes Yes Yes  --     Response to Exercise   Blood Pressure (Admit) 118/64 110/68 102/74 118/60  --   Blood Pressure (Exercise) 140/80 144/70 138/70 152/70  --   Blood Pressure (Exit) 124/70 110/70 106/64 106/62  --   Heart Rate (Admit) 88 bpm 85 bpm 93 bpm 92 bpm  --   Heart Rate (Exercise) 134 bpm 128 bpm 128 bpm 139 bpm  --   Heart Rate (Exit) 108 bpm 91 bpm 107 bpm 90 bpm  --   Oxygen Saturation (Admit) 93 % 92 % 92 % 93 %  --   Oxygen Saturation (Exercise) 92 % 87 % 91 % 88 %  --   Oxygen Saturation (Exit) 95 % 95 % 97 % 92 %  --   Rating of Perceived Exertion (Exercise) _11 --   Perceived Dyspnea (Exercise) _12 --   Symptoms shortness of breath on treadmill shortness of breath on treadmill shortness of breath on treadmill shortness of breath on treadmill  --   Comments Home Exercise Guidelines given 11/04/15 Home Exercise Guidelines given 11/04/15 Home Exercise Guidelines given 11/04/15 Home Exercise Guidelines given 11/04/15  --   Duration Progress to 45 minutes of aerobic exercise without signs/symptoms  of physical distress Progress to 45 minutes of aerobic exercise without signs/symptoms of physical distress Progress to 45 minutes of aerobic exercise without signs/symptoms of physical distress Progress to 45 minutes of aerobic exercise without signs/symptoms of physical distress  --   Intensity THRR unchanged THRR unchanged THRR unchanged THRR unchanged  --     Progression   Progression Continue to progress workloads to maintain intensity without signs/symptoms of physical distress. Continue to progress workloads to maintain intensity without signs/symptoms of physical distress. Continue to progress workloads to maintain intensity without signs/symptoms of physical distress. Continue to progress workloads to maintain intensity without signs/symptoms of physical distress.  --   Average METs 2.62 2.7 2.78  2.45  --     Resistance Training   Training Prescription Yes Yes Yes Yes  --   Weight 2 3 lbs 3 lbs  2 lbs left hand 3 lbs  2 lbs left hand  --   Reps 10-12 10-15 10-15 10-15  --     Interval Training   Interval Training No No No No  --     Oxygen   Oxygen Continuous Continuous Continuous Continuous  --   Liters _0 3L on treadmill 2  --     Treadmill   MPH 1.4 1.5 1.5 1.5  --   Grade 0.5 0.5 0.5 1  --   Minutes _1 --   METs 2.17 2.2 2.25 2.36  --     NuStep   Level _2 --   Minutes _3 --   METs 2.7 2.9 3.1 2.6  --     Biostep-RELP   Level _4 --   Minutes _5 --   METs _6 --     Home Exercise Plan   Plans to continue exercise at Home  walking in pool at home Home  walking in pool at home Home  walking in pool at home Home  walking in pool at home Home  has equipment at home   Frequency Add 2 additional days to program exercise sessions. Add 2 additional days to program exercise sessions. Add 2 additional days to program exercise sessions. Add 2 additional days to program exercise sessions. Add 2 additional days to program exercise sessions.   [place] Name 01/08/16 1400 01/23/16 1200           Exercise Review   Progression Yes Yes        Response to Exercise   Blood Pressure (Admit) 112/70 120/68      Blood Pressure (Exercise) 138/78 140/68      Blood Pressure (Exit) 116/64 124/82      Heart Rate (Admit) 81 bpm 88 bpm      Heart Rate (Exercise) 121 bpm 127 bpm      Heart Rate (Exit) 106 bpm 107 bpm      Oxygen Saturation (Admit) 92 % 93 %      Oxygen Saturation (Exercise) 93 % 91 %      Oxygen Saturation (Exit) 94 % 94 %      Rating of Perceived Exertion (Exercise) 11 11      Perceived Dyspnea (Exercise) 3 3      Duration Progress to 45 minutes of aerobic exercise without signs/symptoms of physical distress Progress to 45 minutes of aerobic exercise without signs/symptoms of physical distress  Intensity THRR  unchanged THRR unchanged        Progression   Progression Continue to progress workloads to maintain intensity without signs/symptoms of physical distress. Continue to progress workloads to maintain intensity without signs/symptoms of physical distress.      Average METs 2.5 2.9        Resistance Training   Training Prescription Yes Yes      Weight 3 3      Reps 10-15 10-15        Interval Training   Interval Training No No        Oxygen   Oxygen Continuous Continuous      Liters 2 2        Treadmill   MPH 1.8 1.8      Grade 1 1      Minutes 15 15      METs  -- 2.63        NuStep   Level 6 6      Minutes 15 15      METs  -- 3.2        Biostep-RELP   Level 5 5      Minutes 15 15         Exercise Comments:     Exercise Comments    Row Name 09/17/15 1605 09/23/15 1213 10/01/15 1551 10/14/15 1222 10/16/15 1515   Exercise Comments Pt did not report any musculoskeletal issues - expected to progress well with exercise.  Noticed that she felt better during 6 min walk with 1 L contiuous oxygen. First full day of exercise!  Patient was oriented to gym and equipment including functions, settings, policies, and procedures.  Patient's individual exercise prescription and treatment plan were reviewed.  All starting workloads were established based on the results of the 6 minute walk test done at initial orientation visit.  The plan for exercise progression was also introduced and progression will be customized based on patient's performance and goals. Jenelle is off to a good start with her rehab.  She seems to be enjoying coming to class.  She has already progressed to a full 15 min on the treadmill!  We will continue to work with her to increase her workloads to build up strength and stamina. Maleeka has reported that she is having less SOB with daily activities. She has been feeling like she has more energy and has been walking in her pool at home on the days that she does not come to class.   Eldoris has been doing great with exercise.  She continues to make good progression and we hope she will continue to do so.   Row Name 10/30/15 1040 11/04/15 1157 11/08/15 1054 11/13/15 1524 11/27/15 1444   Exercise Comments Arantxa continues to do well with exercise.  She is walking in the pool at home on her off days.  We will continue to monitor for progress. Reviewed home exercise with pt today.  Pt plans to walk in pool and at home for exercise.  She is planning to get a home gym and stationary bike to use as well.  Reviewed THR, pulse, RPE, sign and symptoms, pulse oximetry, and when to call 911 or MD.  Also discussed weather considerations and indoor options.  Pt voiced understanding. Laporsha completed her mid program 6 min walk test today.  She improved by 49f.  She started her walk on room air, but did desaturate and finished on 2L.  She has been exercising with  2L. Arretta continues to do well with exercise.  She is up to level 4 on the BioStep.  We will continue to monitor for progress. Lataunya is making good progress with exercise.  She is walking at home and is up to level 5 on the BioStep.  We will continue to monitor for progression.   Row Name 12/11/15 1526 12/24/15 1520 12/30/15 1151 01/08/16 1410 01/13/16 1059   Exercise Comments Akansha continues to do well with exercise.  She is up to level 6 now on the stepper.  We will continue to monitor for progression. Sahana has been doing well with exercise.  She has not been feeling 100% this week.  We will continue to monitor for progression. Elizah has purchased a stationary bike to use at home.  Home exercise was discussed incl heart rate RPE and safety. Lasha continues to progress well with exercise. 6 min walk done today. See 6 min walk data for detailed report. Progress and future exercise goals were discussed with patient.    [place] Name 01/23/16 1224           Exercise Comments Sandrine continues to progress well with exercise.          Discharge  Exercise Prescription (Final Exercise Prescription Changes):     Exercise Prescription Changes - 01/23/16 1200      Exercise Review   Progression Yes     Response to Exercise   Blood Pressure (Admit) 120/68   Blood Pressure (Exercise) 140/68   Blood Pressure (Exit) 124/82   Heart Rate (Admit) 88 bpm   Heart Rate (Exercise) 127 bpm   Heart Rate (Exit) 107 bpm   Oxygen Saturation (Admit) 93 %   Oxygen Saturation (Exercise) 91 %   Oxygen Saturation (Exit) 94 %   Rating of Perceived Exertion (Exercise) 11   Perceived Dyspnea (Exercise) 3   Duration Progress to 45 minutes of aerobic exercise without signs/symptoms of physical distress   Intensity THRR unchanged     Progression   Progression Continue to progress workloads to maintain intensity without signs/symptoms of physical distress.   Average METs 2.9     Resistance Training   Training Prescription Yes   Weight 3   Reps 10-15     Interval Training   Interval Training No     Oxygen   Oxygen Continuous   Liters 2     Treadmill   MPH 1.8   Grade 1   Minutes 15   METs 2.63     NuStep   Level 6   Minutes 15   METs 3.2     Biostep-RELP   Level 5   Minutes 15       Nutrition:  Target Goals: Understanding of nutrition guidelines, daily intake of sodium <1574m, cholesterol <2088m calories 30% from fat and 7% or less from saturated fats, daily to have 5 or more servings of fruits and vegetables.  Biometrics:     Pre Biometrics - 09/17/15 1608      Pre Biometrics   Height _0  (1.6 m)   Weight 142 lb (64.4 kg)   Waist Circumference 36 inches   Hip Circumference 41.25 inches   Waist to Hip Ratio 0.87 %   BMI (Calculated) 25.2         Post Biometrics - 01/13/16 1058       Post  Biometrics   Height _1  (1.6 m)   Weight 140 lb 12.8 oz (63.9 kg)   Waist Circumference  34.5 inches   Hip Circumference 41 inches   Waist to Hip Ratio 0.84 %   BMI (Calculated) 25      Nutrition Therapy Plan and  Nutrition Goals:     Nutrition Therapy & Goals - 09/17/15 0900      Nutrition Therapy   Diet Prefers not to see the dietitian; Husband cooks healthy meals with vegetables; she does not use salt, but does like sweets; her gopal is to loss 5lbs.      Nutrition Discharge: Rate Your Plate Scores:   Psychosocial: Target Goals: Acknowledge presence or absence of depression, maximize coping skills, provide positive support system. Participant is able to verbalize types and ability to use techniques and skills needed for reducing stress and depression.  Initial Review & Psychosocial Screening:     Initial Psych Review & Screening - 09/17/15 0900      Family Dynamics   Good Support System? Yes   Comments Ms Crownover has good support from her children and husband. She states she has no depression, although she does miss walking distances without shortness of breath and is looking forward to partivccipation in [facility].     Barriers   Psychosocial barriers to participate in program The patient should benefit from training in stress management and relaxation.;There are no identifiable barriers or psychosocial needs.     Screening Interventions   Interventions Encouraged to exercise      Quality of Life Scores:     Quality of Life - 01/20/16 0744      Quality of Life Scores   Health/Function Pre 20.19 %   Health/Function Post 17.78 %   Health/Function % Change -11.94 %   Socioeconomic Pre 26 %   Socioeconomic Post 22.57 %   Socioeconomic % Change  -13.19 %   Psych/Spiritual Pre 24.86 %   Psych/Spiritual Post 24.43 %   Psych/Spiritual % Change -1.73 %   Family Pre 27.6 %   Family Post 29.5 %   Family % Change 6.88 %   GLOBAL Pre 23.26 %   GLOBAL Post 21.74 %   GLOBAL % Change -6.53 %      PHQ-9: Recent Review Flowsheet Data    Depression screen [facility] 2/9 01/20/2016 09/17/2015 09/12/2014   Decreased Interest 0 0 0   Down, Depressed, Hopeless 0 0 0   PHQ - 2 Score 0 0 0    Altered sleeping 1 0 -   Tired, decreased energy 1 1 -   Change in appetite 0 0 -   Feeling bad or failure about yourself  0 0 -   Trouble concentrating 0 0 -   Moving slowly or fidgety/restless 0 0 -   Suicidal thoughts 0 0 -   PHQ-9 Score 2 1 -   Difficult doing work/chores Not difficult at all Somewhat difficult -      Psychosocial Evaluation and Intervention:     Psychosocial Evaluation - 01/20/16 1130      Discharge Psychosocial Assessment & Intervention   Comments Counselor met with Ms. Grosshans today for a discharge summary.  She reports this program has been "great" for her with the consistent exercise and the educational and social components.  She reports having more energy; walking further and generally being able to do more.  She has some current stress with a grandson getting married at her house soon; but she reports having lots of help and learning to delegate better and know her own limits.   She also states she  is sleeping better now than before she came into this program.  Counselor commended Ms. Abeln for all her progress and encouraged consistent exercise beyond this program.  She reports having some gym equipment at home to maintain consistency in her exercise habits.         Psychosocial Re-Evaluation:     Psychosocial Re-Evaluation    Row Name 10/14/15 1144 11/11/15 1114 11/25/15 1145 01/13/16 0726       Psychosocial Re-Evaluation   Comments Counselor follow up with Ms. Jester reporting she is "surprised" how much she is enjoying this class and how much progress she is already experiencing.  She stated she was able to walk 25 laps in her pool this past weekend and her family have mentioned how much stronger her voice sounds on the phone lately.  Ms. [place]  maintains a positive attitude and reports minimal stress in her life at this time.  Counselor will continue to follow with Ms. Fine and commended her on her progress and commitment to consistency in  exercising, both here and at home.   Counselor follow up with Ms. Megan Salon.  She stated she was walking better and breathing better since coming into this class.  She was able to go to the beach recently for a short vacation and states she was amazed at how well she did during that trip.  Her mood continues to be positive.  She is contending with some muscle tension in her neck with her Dr. recommending a massage in the near future.  Counselor will follow with Ms. Flud during the course of treatment in thie program.   Counselor follow up with Ms. Bushee reporting continued positive results from this program.  She states she "loves it" and is able to do so much more now since beginning.  She even "cleaned her wood floors" this past week and her spouse was amazed.  Her mood is typically positive and her Dr. states "all is well."  She recently began to have difficulty with getting to sleep and counselor encouraged a natural OTC sleep aid if this does not improve as well as speaking to Dr. or Pharmacist.  Counselor will continue to follow with Ms. Megan Salon.   Ms Hornbrook is nearing graduation. She has learned to pace herself with family requests and to say "no" if too shortof breath. Through exercise and COPD education, Ms Sangster has added qualtity to her life.       Education: Education Goals: Education classes will be provided on a weekly basis, covering required topics. Participant will state understanding/return demonstration of topics presented.  Learning Barriers/Preferences:     Learning Barriers/Preferences - 09/17/15 0900      Learning Barriers/Preferences   Learning Barriers None   Learning Preferences Group Instruction;Individual Instruction;Pictoral;Skilled Demonstration;Verbal Instruction;Video;Written Material      Education Topics: Initial Evaluation Education: - Verbal, written and demonstration of respiratory meds, RPE/PD scales, oximetry and breathing techniques. Instruction  on use of nebulizers and MDIs: cleaning and proper use, rinsing mouth with steroid doses and importance of monitoring MDI activations. Flowsheet Row Pulmonary Rehab from 01/27/2016 in [facility] Cardiac and Pulmonary Rehab  Date  09/17/15  Educator  LB  Instruction Review Code  2- meets goals/outcomes      General Nutrition Guidelines/Fats and Fiber: -Group instruction provided by verbal, written material, models and posters to present the general guidelines for heart healthy nutrition. Gives an explanation and review of dietary fats and fiber. Flowsheet Row Pulmonary Rehab from 01/27/2016 in [facility]  Cardiac and Pulmonary Rehab  Date  11/04/15  Educator  CR  Instruction Review Code  2- meets goals/outcomes      Controlling Sodium/Reading Food Labels: -Group verbal and written material supporting the discussion of sodium use in heart healthy nutrition. Review and explanation with models, verbal and written materials for utilization of the food label. Flowsheet Row Pulmonary Rehab from 01/27/2016 in [facility] Cardiac and Pulmonary Rehab  Date  09/30/15  Educator  CR  Instruction Review Code  2- meets goals/outcomes      Exercise Physiology & Risk Factors: - Group verbal and written instruction with models to review the exercise physiology of the cardiovascular system and associated critical values. Details cardiovascular disease risk factors and the goals associated with each risk factor.   Aerobic Exercise & Resistance Training: - Gives group verbal and written discussion on the health impact of inactivity. On the components of aerobic and resistive training programs and the benefits of this training and how to safely progress through these programs.   Flexibility, Balance, General Exercise Guidelines: - Provides group verbal and written instruction on the benefits of flexibility and balance training programs. Provides general exercise guidelines with specific guidelines to those with heart or  lung disease. Demonstration and skill practice provided.   Stress Management: - Provides group verbal and written instruction about the health risks of elevated stress, cause of high stress, and healthy ways to reduce stress.   Depression: - Provides group verbal and written instruction on the correlation between heart/lung disease and depressed mood, treatment options, and the stigmas associated with seeking treatment.   Exercise & Equipment Safety: - Individual verbal instruction and demonstration of equipment use and safety with use of the equipment. Flowsheet Row Pulmonary Rehab from 01/27/2016 in [facility] Cardiac and Pulmonary Rehab  Date  09/17/15  Educator  LB  Instruction Review Code  2- meets goals/outcomes      Infection Prevention: - Provides verbal and written material to individual with discussion of infection control including proper hand washing and proper equipment cleaning during exercise session. Flowsheet Row Pulmonary Rehab from 01/27/2016 in [facility] Cardiac and Pulmonary Rehab  Date  09/17/15  Educator  LB  Instruction Review Code  2- meets goals/outcomes      Falls Prevention: - Provides verbal and written material to individual with discussion of falls prevention and safety. Flowsheet Row Pulmonary Rehab from 01/27/2016 in [facility] Cardiac and Pulmonary Rehab  Date  09/17/15  Educator  LB  Instruction Review Code  2- meets goals/outcomes      Diabetes: - Individual verbal and written instruction to review signs/symptoms of diabetes, desired ranges of glucose level fasting, after meals and with exercise. Advice that pre and post exercise glucose checks will be done for 3 sessions at entry of program.   Chronic Lung Diseases: - Group verbal and written instruction to review new updates, new respiratory medications, new advancements in procedures and treatments. Provide informative websites and "800" numbers of self-education. Flowsheet Row Pulmonary Rehab from  01/27/2016 in [facility] Cardiac and Pulmonary Rehab  Date  01/27/16  Educator  LB  Instruction Review Code  2- meets goals/outcomes      Lung Procedures: - Group verbal and written instruction to describe testing methods done to diagnose lung disease. Review the outcome of test results. Describe the treatment choices: Pulmonary Function Tests, ABGs and oximetry. Flowsheet Row Pulmonary Rehab from 01/27/2016 in [facility] Cardiac and Pulmonary Rehab  Date  11/01/15  Educator  Erline Levine  Instruction Review  Code  2- meets goals/outcomes      Energy Conservation: - Provide group verbal and written instruction for methods to conserve energy, plan and organize activities. Instruct on pacing techniques, use of adaptive equipment and posture/positioning to relieve shortness of breath.   Triggers: - Group verbal and written instruction to review types of environmental controls: home humidity, furnaces, filters, dust mite/pet prevention, HEPA vacuums. To discuss weather changes, air quality and the benefits of nasal washing.   Exacerbations: - Group verbal and written instruction to provide: warning signs, infection symptoms, calling MD promptly, preventive modes, and value of vaccinations. Review: effective airway clearance, coughing and/or vibration techniques. Create an Sports administrator. Flowsheet Row Pulmonary Rehab from 01/27/2016 in [facility] Cardiac and Pulmonary Rehab  Date  10/07/15  Educator  LB  Instruction Review Code  2- meets goals/outcomes      Oxygen: - Individual and group verbal and written instruction on oxygen therapy. Includes supplement oxygen, available portable oxygen systems, continuous and intermittent flow rates, oxygen safety, concentrators, and Medicare reimbursement for oxygen. Flowsheet Row Pulmonary Rehab from 01/27/2016 in [facility] Cardiac and Pulmonary Rehab  Date  09/17/15  Educator  LB  Instruction Review Code  2- meets goals/outcomes      Respiratory Medications: - Group  verbal and written instruction to review medications for lung disease. Drug class, frequency, complications, importance of spacers, rinsing mouth after steroid MDI's, and proper cleaning methods for nebulizers. Flowsheet Row Pulmonary Rehab from 01/27/2016 in [facility] Cardiac and Pulmonary Rehab  Date  09/17/15  Educator  LB  Instruction Review Code  2- meets goals/outcomes      AED/CPR: - Group verbal and written instruction with the use of models to demonstrate the basic use of the AED with the basic ABC's of resuscitation. Flowsheet Row Pulmonary Rehab from 01/27/2016 in [facility] Cardiac and Pulmonary Rehab  Date  11/15/15  Educator  CE  Instruction Review Code  2- meets goals/outcomes      Breathing Retraining: - Provides individuals verbal and written instruction on purpose, frequency, and proper technique of diaphragmatic breathing and pursed-lipped breathing. Applies individual practice skills. Flowsheet Row Pulmonary Rehab from 01/27/2016 in [facility] Cardiac and Pulmonary Rehab  Date  09/17/15  Educator  LB  Instruction Review Code  2- meets goals/outcomes      Anatomy and Physiology of the Lungs: - Group verbal and written instruction with the use of models to provide basic lung anatomy and physiology related to function, structure and complications of lung disease. Flowsheet Row Pulmonary Rehab from 01/27/2016 in [facility] Cardiac and Pulmonary Rehab  Date  10/04/15  Educator  [place]  Instruction Review Code  2- meets goals/outcomes      Heart Failure: - Group verbal and written instruction on the basics of heart failure: signs/symptoms, treatments, explanation of ejection fraction, enlarged heart and cardiomyopathy. Flowsheet Row Pulmonary Rehab from 01/27/2016 in [facility] Cardiac and Pulmonary Rehab  Date  10/11/15 [facility] your numbers]  Educator  C. EnterkinRN  Instruction Review Code  2- meets goals/outcomes      Sleep Apnea: - Individual verbal and written instruction to review  Obstructive Sleep Apnea. Review of risk factors, methods for diagnosing and types of masks and machines for OSA.   Anxiety: - Provides group, verbal and written instruction on the correlation between heart/lung disease and anxiety, treatment options, and management of anxiety. Flowsheet Row Pulmonary Rehab from 01/27/2016 in [facility] Cardiac and Pulmonary Rehab  Date  10/23/15  Educator  [facility]  Instruction Review Code  2- Meets goals/outcomes      Relaxation: - Provides group, verbal and written instruction about the benefits of relaxation for patients with heart/lung disease. Also provides patients with examples of relaxation techniques.   Knowledge Questionnaire Score:     Knowledge Questionnaire Score - 01/20/16 0743      Knowledge Questionnaire Score   Post Score 9/10       Core Components/Risk Factors/Patient Goals at Admission:     Personal Goals and Risk Factors at Admission - 09/17/15 0900      Core Components/Risk Factors/Patient Goals on Admission   Sedentary Yes  Ms Sievert's goal is to increase her walking. she may plan to walk in their pool this summer.   Intervention Provide advice, education, support and counseling about physical activity/exercise needs.;Develop an individualized exercise prescription for aerobic and resistive training based on initial evaluation findings, risk stratification, comorbidities and participant's personal goals.   Expected Outcomes Achievement of increased cardiorespiratory fitness and enhanced flexibility, muscular endurance and strength shown through measurements of functional capacity and personal statement of participant.   Increase Strength and Stamina Yes   Intervention Provide advice, education, support and counseling about physical activity/exercise needs.;Develop an individualized exercise prescription for aerobic and resistive training based on initial evaluation findings, risk stratification, comorbidities and participant's  personal goals.   Expected Outcomes Achievement of increased cardiorespiratory fitness and enhanced flexibility, muscular endurance and strength shown through measurements of functional capacity and personal statement of participant.   Improve shortness of breath with ADL's Yes   Intervention Provide education, individualized exercise plan and daily activity instruction to help decrease symptoms of SOB with activities of daily living.   Expected Outcomes Short Term: Achieves a reduction of symptoms when performing activities of daily living.   Develop more efficient breathing techniques such as purse lipped breathing and diaphragmatic breathing; and practicing self-pacing with activity Yes   Intervention Provide education, demonstration and support about specific breathing techniuqes utilized for more efficient breathing. Include techniques such as pursed lipped breathing, diaphragmatic breathing and self-pacing activity.   Expected Outcomes Short Term: Participant will be able to demonstrate and use breathing techniques as needed throughout daily activities.   Increase knowledge of respiratory medications and ability to use respiratory devices properly  Yes  Uses oxygen for sleep and activity - 1l/m; she needs to be encouraged to use with activity on continuous flow; uses Advair, Spiriva, Albuterol MDIs and SVN with Albuterol.   Intervention Provide education and demonstration as needed of appropriate use of medications, inhalers, and oxygen therapy.   Expected Outcomes Short Term: Achieves understanding of medications use. Understands that oxygen is a medication prescribed by physician. Demonstrates appropriate use of inhaler and oxygen therapy.   Hypertension Yes   Intervention Provide education on lifestyle modifcations including regular physical activity/exercise, weight management, moderate sodium restriction and increased consumption of fresh fruit, vegetables, and low fat dairy, alcohol  moderation, and smoking cessation.;Monitor prescription use compliance.   Expected Outcomes Short Term: Continued assessment and intervention until BP is < 140/76m HG in hypertensive participants. < 130/885mHG in hypertensive participants with diabetes, heart failure or chronic kidney disease.;Long Term: Maintenance of blood pressure at goal levels.      Core Components/Risk Factors/Patient Goals Review:      Goals and Risk Factor Review    Row Name 09/23/15 1213 09/30/15 1000 10/07/15 1000 10/10/15 1318 10/10/15 1329     Core Components/Risk Factors/Patient Goals Review   Personal Goals Review Develop more efficient breathing techniques  such as purse lipped breathing and diaphragmatic breathing and practicing self-pacing with activity. Increase knowledge of respiratory medications and ability to use respiratory devices properly. Develop more efficient breathing techniques such as purse lipped breathing and diaphragmatic breathing and practicing self-pacing with activity.;Improve shortness of breath with ADL's Hypertension;Lipids Hypertension;Lipids   Review Pursed lip breathing techniques were discussed and demonstrated with patient. Patient demonstrated understanding of these techniques. Ms Losee had a go day on the  treadmill and maintained her O2Sat's in the 90's which reassured and gave her more confidence in using the treadmill. she recognizes the importance of increasing her oxygen to 2l/m continuous and how this can help her with activity. Ms Peltz states she is not using her Albuterol Inhaler, only occasionally, instead she is using PLB. She is very aware of weather and her breathing and stays inside on "bad air" days. and does a good job managing her shortness of breath . Lameisha continues to maintain good BP control.  She continues to take her hypertension and cholesterol medications as ordered. Shantee continues to maintain good BP control.  She continues to take her hypertension and  cholesterol medications as ordered.   Expected Outcomes Patient will independenly use pursed lip breathing techniques to help tolerate exercise.  Continue progressing with oxygen use with her exercise goals. Continue using PLB to manage her COPD. COntinued control of her risk factors through meds, exerse and nutrion plan. COntinued control of her risk factors through meds, exerse and nutrion plan.   [place] Name 10/14/15 1615 11/04/15 1157 11/04/15 1529 11/22/15 1123 11/26/15 0723     Core Components/Risk Factors/Patient Goals Review   Personal Goals Review Sedentary Sedentary;Improve shortness of breath with ADL's;Increase Strength and Stamina Hypertension;Develop more efficient breathing techniques such as purse lipped breathing and diaphragmatic breathing and practicing self-pacing with activity.;Increase knowledge of respiratory medications and ability to use respiratory devices properly.;Improve shortness of breath with ADL's Lipids;Hypertension Increase knowledge of respiratory medications and ability to use respiratory devices properly.;Develop more efficient breathing techniques such as purse lipped breathing and diaphragmatic breathing and practicing self-pacing with activity.;Improve shortness of breath with ADL's;Sedentary;Increase Strength and Stamina   Review Ms Ziesmer plans to continue her exercise after [facility]. Her husband is currently looking for a piece of exercise equipment they can use at home. Berlie has not been doing much exercise at home due to the weather recently.  She is getting in the pool when she is able and is planning to buy some equipment for home use.  She has noticed that she is able to do more at home with improved SOB and strength and stamina. Ms Yadao has maintained acceptable BP's at rest and with exercise. She has a good understanding of her MDI's and is very compliant in taking them everyday. She uses PLB during her exercise goals and activities at home. She  has  noticed an improvement in her shortness of breath with activites at home and does adjust her oxygen as needed with activity from 2 to 3l/m. BP readings remain in good levels. Recent blod panel done,reports Cholesterol nu mbers are good.  Maythe continues to be compliant with  her meds, exercise and nutriotion plan.  She has needed to drecrease her BP med by half, her MD is aware. Ms Darius states she is feeling stronger and doing more walk around their house. The other day she mopped their hardwood floor in the kitchen - which she had not done for time time. She still has good and bad  days with her shortness of breath, but with pacing and PLB, she manages the breathing well.   Expected Outcomes Continue exercising with exercise equipment at home. Reviewed home exercise guidelines with Sharni today to add in more at home.  She will also continue to come ot exercise and education to continue to boost her strength and stamina.  -- Continue with meds, exercise plan and nutrition to maintain risk factor control.  --   Row Name 12/30/15 1152 12/30/15 1414 01/03/16 1451 01/03/16 1454 01/03/16 1456     Core Components/Risk Factors/Patient Goals Review   Personal Goals Review Hypertension;Increase Strength and Stamina;Improve shortness of breath with ADL's Hypertension Increase knowledge of respiratory medications and ability to use respiratory devices properly. Improve shortness of breath with ADL's Develop more efficient breathing techniques such as purse lipped breathing and diaphragmatic breathing and practicing self-pacing with activity.   Review Reizy reports imporved stamina with daily activities.  Her Dr reduced her BP med by half due to her lifestyle change of adding exercise.  -- Ms. Valvo takes Albuterol MDI & SVN, Spiriva and Advair for her respiratory issues.  She has a spacer for her inhaler.  She knows to rinse her mouth after taking Advair.  She also wears 1L O2 during the day and 2L O2 while  exercising. Ms. Haft states that she has noticed that she is able to walk with less SOB and is able to do more housework since she has been exercising in pulmonary rehabilation. Ms. Bidwell demonstrates proper technique of PLB.    Expected Outcomes  -- Carlo will continue to have BP readings within normal limits by continuing the lifestyle change of exercise. Ms. Kinn will decrease her chances of exacerbations and SOB if she continues to take her respiratory medications as directed by her doctor.  Continued strength training and cardiovascular exercise sould help Mr. Son have less SOB while performing ADL's. PLB will help Ms. Rossie regain her breath during exercise.    [place] Name 01/20/16 0748 01/20/16 0752           Core Components/Risk Factors/Patient Goals Review   Personal Goals Review Increase Strength and Stamina;Sedentary;Develop more efficient breathing techniques such as purse lipped breathing and diaphragmatic breathing and practicing self-pacing with activity.;Improve shortness of breath with ADL's;Increase knowledge of respiratory medications and ability to use respiratory devices properly. Hypertension      Review Ms Labrador is nearing graduation from [employer]. She has improved her breathing - noticeable with housework, walking, and exercise. She uses her PLB with all activity and is very compliant with her MDI's and is knowledgeable about her MDI's. She wants to continue exercise and knows the importance of exercise in managing her COPD.  Ms Mckiver is compliant with her Blood Pressure medicine and has acceptable BP during [facility]. She also manages her diet and salt.         Core Components/Risk Factors/Patient Goals at Discharge (Final Review):      Goals and Risk Factor Review - 01/20/16 0752      Core Components/Risk Factors/Patient Goals Review   Personal Goals Review Hypertension   Review Ms Belko is compliant with her Blood Pressure medicine and has  acceptable BP during [facility]. She also manages her diet and salt.      ITP Comments:     ITP Comments    Row Name 01/08/16 1411           ITP Comments Sherin continues to progress well with exercise.  Comments: Ms Lotspeich plans to graduate 01/29/2016 and will continue to exercise at home. Her husband is planning to buy a stationary bike which they both can exercise 3 days/week.

## 2016-01-28 NOTE — Patient Instructions (Signed)
Discharge Instructions  Patient Details  Name: Joyce Rodriguez MRN: [id number] Date of Birth: [birth year] Referring Provider:  Lavera Guise, MD   Number of Visits: 36  Reason for Discharge:  Patient reached a stable level of exercise. Patient independent in their exercise.  Smoking History:  History  Smoking Status   Former Smoker   Types: Cigarettes   Quit date: 03/18/2001  Smokeless Tobacco   Never Used    Diagnosis:  COPD, moderate ([place])  Initial Exercise Prescription:     Initial Exercise Prescription - 09/17/15 1500      Date of Initial Exercise RX and Referring Provider   Date 09/17/15   Referring Provider Clayborn Bigness     Oxygen   Oxygen Continuous   Liters 1     Treadmill   MPH 1.5   Grade 0   Minutes 15   METs 2.4     NuStep   Level 3   Watts 30   Minutes 15   METs 2.4     Recumbant Elliptical   Level 1   RPM 50   Watts 30   Minutes 15   METs 2.4     Biostep-RELP   Level 3   Watts 30   Minutes 15   METs 2.4     Prescription Details   Frequency (times per week) 3-5   Duration Progress to 45 minutes of aerobic exercise without signs/symptoms of physical distress     Intensity   THRR 40-80% of Max Heartrate 120-141   Ratings of Perceived Exertion 11-15   Perceived Dyspnea 0-4     Progression   Progression Continue to progress workloads to maintain intensity without signs/symptoms of physical distress.     Resistance Training   Training Prescription Yes   Weight 2   Reps 10-12      Discharge Exercise Prescription (Final Exercise Prescription Changes):     Exercise Prescription Changes - 01/23/16 1200      Exercise Review   Progression Yes     Response to Exercise   Blood Pressure (Admit) 120/68   Blood Pressure (Exercise) 140/68   Blood Pressure (Exit) 124/82   Heart Rate (Admit) 88 bpm   Heart Rate (Exercise) 127 bpm   Heart Rate (Exit) 107 bpm   Oxygen Saturation (Admit) 93 %   Oxygen Saturation (Exercise) 91 %    Oxygen Saturation (Exit) 94 %   Rating of Perceived Exertion (Exercise) 11   Perceived Dyspnea (Exercise) 3   Duration Progress to 45 minutes of aerobic exercise without signs/symptoms of physical distress   Intensity THRR unchanged     Progression   Progression Continue to progress workloads to maintain intensity without signs/symptoms of physical distress.   Average METs 2.9     Resistance Training   Training Prescription Yes   Weight 3   Reps 10-15     Interval Training   Interval Training No     Oxygen   Oxygen Continuous   Liters 2     Treadmill   MPH 1.8   Grade 1   Minutes 15   METs 2.63     NuStep   Level 6   Minutes 15   METs 3.2     Biostep-RELP   Level 5   Minutes 15      Functional Capacity:     6 Minute Walk    Row Name 09/17/15 1536 11/08/15 1051 01/13/16 1053     6 Minute Walk  Phase  -- Mid Program Discharge   Distance 858 feet 940 feet 920 feet   Distance % Change  -- 9.5 % 7 %   Walk Time 6 minutes 6 minutes 6 minutes   # of Rest Breaks 0 0 0   MPH 1.63 1.78 1.74   METS 2.48 2.46 2.8   RPE 13 13 13    Perceived Dyspnea   -- 3 3   VO2 Peak 8.7 8.6 9.8   Symptoms No No No   Resting HR 100 bpm 88 bpm 92 bpm   Resting BP 118/66 122/70 112/64   Max Ex. HR 120 bpm 120 bpm 126 bpm   Max Ex. BP 132/68 110/76 146/72   2 Minute Post BP  -- 100/70 130/70     Interval HR   Baseline HR 100 88 92   1 Minute HR 110 106 118   2 Minute HR 114 112 121   3 Minute HR 116 110 122   4 Minute HR 118 110 126   5 Minute HR 120 119 126   6 Minute HR 118 120 122   2 Minute Post HR 106 103 104   Interval Heart Rate? Yes Yes Yes     Interval Oxygen   Interval Oxygen? Yes Yes Yes   Baseline Oxygen Saturation % 92 % 93 % 94 %   Baseline Liters of Oxygen 1 L 0 L  Room Air 2 L   1 Minute Oxygen Saturation % 95 % 93 % 91 %   1 Minute Liters of Oxygen 1 L 0 L 2 L   2 Minute Oxygen Saturation % 93 % 87 % 94 %   2 Minute Liters of Oxygen 1 L 0 L 2 L    3 Minute Oxygen Saturation % 91 % 84 % 91 %   3 Minute Liters of Oxygen 1 L 0 L 2 L   4 Minute Oxygen Saturation % 88 % 82 % 91 %   4 Minute Liters of Oxygen 1 L 0 L 2 L   5 Minute Oxygen Saturation % 88 % 84 % 90 %   5 Minute Liters of Oxygen 1 L 2 L 2 L   6 Minute Oxygen Saturation % 88 % 84 % 88 %   6 Minute Liters of Oxygen 1 L 2 L 2 L   2 Minute Post Oxygen Saturation % 94 % 97 % 95 %   2 Minute Post Liters of Oxygen 1 L 2 L 2 L      Quality of Life:     Quality of Life - 01/20/16 0744      Quality of Life Scores   Health/Function Pre 20.19 %   Health/Function Post 17.78 %   Health/Function % Change -11.94 %   Socioeconomic Pre 26 %   Socioeconomic Post 22.57 %   Socioeconomic % Change  -13.19 %   Psych/Spiritual Pre 24.86 %   Psych/Spiritual Post 24.43 %   Psych/Spiritual % Change -1.73 %   Family Pre 27.6 %   Family Post 29.5 %   Family % Change 6.88 %   GLOBAL Pre 23.26 %   GLOBAL Post 21.74 %   GLOBAL % Change -6.53 %      Personal Goals: Goals established at orientation with interventions provided to work toward goal.     Personal Goals and Risk Factors at Admission - 09/17/15 0900      Core Components/Risk Factors/Patient Goals on Admission  Sedentary Yes  Ms Matusik's goal is to increase her walking. she may plan to walk in their pool this summer.   Intervention Provide advice, education, support and counseling about physical activity/exercise needs.;Develop an individualized exercise prescription for aerobic and resistive training based on initial evaluation findings, risk stratification, comorbidities and participant's personal goals.   Expected Outcomes Achievement of increased cardiorespiratory fitness and enhanced flexibility, muscular endurance and strength shown through measurements of functional capacity and personal statement of participant.   Increase Strength and Stamina Yes   Intervention Provide advice, education, support and counseling  about physical activity/exercise needs.;Develop an individualized exercise prescription for aerobic and resistive training based on initial evaluation findings, risk stratification, comorbidities and participant's personal goals.   Expected Outcomes Achievement of increased cardiorespiratory fitness and enhanced flexibility, muscular endurance and strength shown through measurements of functional capacity and personal statement of participant.   Improve shortness of breath with ADL's Yes   Intervention Provide education, individualized exercise plan and daily activity instruction to help decrease symptoms of SOB with activities of daily living.   Expected Outcomes Short Term: Achieves a reduction of symptoms when performing activities of daily living.   Develop more efficient breathing techniques such as purse lipped breathing and diaphragmatic breathing; and practicing self-pacing with activity Yes   Intervention Provide education, demonstration and support about specific breathing techniuqes utilized for more efficient breathing. Include techniques such as pursed lipped breathing, diaphragmatic breathing and self-pacing activity.   Expected Outcomes Short Term: Participant will be able to demonstrate and use breathing techniques as needed throughout daily activities.   Increase knowledge of respiratory medications and ability to use respiratory devices properly  Yes  Uses oxygen for sleep and activity - 1l/m; she needs to be encouraged to use with activity on continuous flow; uses Advair, Spiriva, Albuterol MDIs and SVN with Albuterol.   Intervention Provide education and demonstration as needed of appropriate use of medications, inhalers, and oxygen therapy.   Expected Outcomes Short Term: Achieves understanding of medications use. Understands that oxygen is a medication prescribed by physician. Demonstrates appropriate use of inhaler and oxygen therapy.   Hypertension Yes   Intervention Provide  education on lifestyle modifcations including regular physical activity/exercise, weight management, moderate sodium restriction and increased consumption of fresh fruit, vegetables, and low fat dairy, alcohol moderation, and smoking cessation.;Monitor prescription use compliance.   Expected Outcomes Short Term: Continued assessment and intervention until BP is < 140/15mm HG in hypertensive participants. < 130/49mm HG in hypertensive participants with diabetes, heart failure or chronic kidney disease.;Long Term: Maintenance of blood pressure at goal levels.       Personal Goals Discharge:     Goals and Risk Factor Review - 01/20/16 0752      Core Components/Risk Factors/Patient Goals Review   Personal Goals Review Hypertension   Review Ms Skelley is compliant with her Blood Pressure medicine and has acceptable BP during [facility]. She also manages her diet and salt.      Nutrition & Weight - Outcomes:     Pre Biometrics - 09/17/15 1608      Pre Biometrics   Height 5\' 3"  (1.6 m)   Weight 142 lb (64.4 kg)   Waist Circumference 36 inches   Hip Circumference 41.25 inches   Waist to Hip Ratio 0.87 %   BMI (Calculated) 25.2         Post Biometrics - 01/13/16 1058       Post  Biometrics   Height 5\' 3"  (1.6  m)   Weight 140 lb 12.8 oz (63.9 kg)   Waist Circumference 34.5 inches   Hip Circumference 41 inches   Waist to Hip Ratio 0.84 %   BMI (Calculated) 25      Nutrition:     Nutrition Therapy & Goals - 09/17/15 0900      Nutrition Therapy   Diet Prefers not to see the dietitian; Husband cooks healthy meals with vegetables; she does not use salt, but does like sweets; her gopal is to loss 5lbs.      Nutrition Discharge:   Education Questionnaire Score:     Knowledge Questionnaire Score - 01/20/16 0743      Knowledge Questionnaire Score   Post Score 9/10      Goals reviewed with patient; copy given to patient.

## 2016-01-29 DIAGNOSIS — J449 Chronic obstructive pulmonary disease, unspecified: Secondary | ICD-10-CM | POA: Diagnosis not present

## 2016-01-29 NOTE — Progress Notes (Signed)
Daily Session Note  Patient Details  Name: Jmya Uliano MRN: [id number] Date of Birth: July 29, 1945 Referring Provider:   Flowsheet Row Pulmonary Rehab from 09/17/2015 in [facility]  Referring Provider  Clayborn Bigness      Encounter Date: 01/29/2016  Check In:     Session Check In - 01/29/16 1142      Check-In   Location [facility]-Cardiac & Pulmonary Rehab   Staff Present Carson Myrtle, BS, RRT, Respiratory Lennie Hummer, MA, ACSM RCEP, Exercise Physiologist;Tameem Pullara Oletta Darter, BA, ACSM CEP, Exercise Physiologist   Supervising physician immediately available to respond to emergencies [facility] immediately available ER MD   Physician(s) Quentin Cornwall and Joni Fears   Medication changes reported     No   Fall or balance concerns reported    No   Warm-up and Cool-down Performed as group-led Location manager Performed Yes   VAD Patient? No     Pain Assessment   Currently in Pain? No/denies   Multiple Pain Sites No         Goals Met:  Proper associated with RPD/PD & O2 Sat Independence with exercise equipment Exercise tolerated well Strength training completed today  Goals Unmet:  Not Applicable  Comments:  Bellina graduated today from pulmonary rehab with 36 sessions completed.  Details of the patient's exercise prescription and what she needs to do in order to continue the prescription and progress were discussed with patient.  Patient was given a copy of prescription and goals.  Patient verbalized understanding.  Dalisha plans to continue to exercise by using equipment at home.     Dr. Emily Filbert is Medical Director for [facility] and [facility].

## 2016-01-29 NOTE — Patient Instructions (Signed)
Discharge Instructions  Patient Details  Name: Joyce Rodriguez MRN: [id number] Date of Birth: [birth year] Referring Provider:  Lavera Guise, MD   Number of Visits: 36  Reason for Discharge:  Patient reached a stable level of exercise. Patient independent in their exercise.  Smoking History:  History  Smoking Status  . Former Smoker  . Types: Cigarettes  . Quit date: 03/18/2001  Smokeless Tobacco  . Never Used    Diagnosis:  No diagnosis found.  Initial Exercise Prescription:     Initial Exercise Prescription - 09/17/15 1500      Date of Initial Exercise RX and Referring Provider   Date 09/17/15   Referring Provider Clayborn Bigness     Oxygen   Oxygen Continuous   Liters 1     Treadmill   MPH 1.5   Grade 0   Minutes 15   METs 2.4     NuStep   Level 3   Watts 30   Minutes 15   METs 2.4     Recumbant Elliptical   Level 1   RPM 50   Watts 30   Minutes 15   METs 2.4     Biostep-RELP   Level 3   Watts 30   Minutes 15   METs 2.4     Prescription Details   Frequency (times per week) 3-5   Duration Progress to 45 minutes of aerobic exercise without signs/symptoms of physical distress     Intensity   THRR 40-80% of Max Heartrate 120-141   Ratings of Perceived Exertion 11-15   Perceived Dyspnea 0-4     Progression   Progression Continue to progress workloads to maintain intensity without signs/symptoms of physical distress.     Resistance Training   Training Prescription Yes   Weight 2   Reps 10-12      Discharge Exercise Prescription (Final Exercise Prescription Changes):     Exercise Prescription Changes - 01/23/16 1200      Exercise Review   Progression Yes     Response to Exercise   Blood Pressure (Admit) 120/68   Blood Pressure (Exercise) 140/68   Blood Pressure (Exit) 124/82   Heart Rate (Admit) 88 bpm   Heart Rate (Exercise) 127 bpm   Heart Rate (Exit) 107 bpm   Oxygen Saturation (Admit) 93 %   Oxygen Saturation (Exercise) 91 %    Oxygen Saturation (Exit) 94 %   Rating of Perceived Exertion (Exercise) 11   Perceived Dyspnea (Exercise) 3   Duration Progress to 45 minutes of aerobic exercise without signs/symptoms of physical distress   Intensity THRR unchanged     Progression   Progression Continue to progress workloads to maintain intensity without signs/symptoms of physical distress.   Average METs 2.9     Resistance Training   Training Prescription Yes   Weight 3   Reps 10-15     Interval Training   Interval Training No     Oxygen   Oxygen Continuous   Liters 2     Treadmill   MPH 1.8   Grade 1   Minutes 15   METs 2.63     NuStep   Level 6   Minutes 15   METs 3.2     Biostep-RELP   Level 5   Minutes 15      Functional Capacity:     6 Minute Walk    Row Name 09/17/15 1536 11/08/15 1051 01/13/16 1053     6 Minute Walk  Phase  - Mid Program Discharge   Distance 858 feet 940 feet 920 feet   Distance % Change  - 9.5 % 7 %   Walk Time 6 minutes 6 minutes 6 minutes   # of Rest Breaks 0 0 0   MPH 1.63 1.78 1.74   METS 2.48 2.46 2.8   RPE 13 13 13    Perceived Dyspnea   - 3 3   VO2 Peak 8.7 8.6 9.8   Symptoms No No No   Resting HR 100 bpm 88 bpm 92 bpm   Resting BP 118/66 122/70 112/64   Max Ex. HR 120 bpm 120 bpm 126 bpm   Max Ex. BP 132/68 110/76 146/72   2 Minute Post BP  - 100/70 130/70     Interval HR   Baseline HR 100 88 92   1 Minute HR 110 106 118   2 Minute HR 114 112 121   3 Minute HR 116 110 122   4 Minute HR 118 110 126   5 Minute HR 120 119 126   6 Minute HR 118 120 122   2 Minute Post HR 106 103 104   Interval Heart Rate? Yes Yes Yes     Interval Oxygen   Interval Oxygen? Yes Yes Yes   Baseline Oxygen Saturation % 92 % 93 % 94 %   Baseline Liters of Oxygen 1 L 0 L  Room Air 2 L   1 Minute Oxygen Saturation % 95 % 93 % 91 %   1 Minute Liters of Oxygen 1 L 0 L 2 L   2 Minute Oxygen Saturation % 93 % 87 % 94 %   2 Minute Liters of Oxygen 1 L 0 L 2 L   3  Minute Oxygen Saturation % 91 % 84 % 91 %   3 Minute Liters of Oxygen 1 L 0 L 2 L   4 Minute Oxygen Saturation % 88 % 82 % 91 %   4 Minute Liters of Oxygen 1 L 0 L 2 L   5 Minute Oxygen Saturation % 88 % 84 % 90 %   5 Minute Liters of Oxygen 1 L 2 L 2 L   6 Minute Oxygen Saturation % 88 % 84 % 88 %   6 Minute Liters of Oxygen 1 L 2 L 2 L   2 Minute Post Oxygen Saturation % 94 % 97 % 95 %   2 Minute Post Liters of Oxygen 1 L 2 L 2 L      Quality of Life:     Quality of Life - 01/20/16 0744      Quality of Life Scores   Health/Function Pre 20.19 %   Health/Function Post 17.78 %   Health/Function % Change -11.94 %   Socioeconomic Pre 26 %   Socioeconomic Post 22.57 %   Socioeconomic % Change  -13.19 %   Psych/Spiritual Pre 24.86 %   Psych/Spiritual Post 24.43 %   Psych/Spiritual % Change -1.73 %   Family Pre 27.6 %   Family Post 29.5 %   Family % Change 6.88 %   GLOBAL Pre 23.26 %   GLOBAL Post 21.74 %   GLOBAL % Change -6.53 %      Personal Goals: Goals established at orientation with interventions provided to work toward goal.     Personal Goals and Risk Factors at Admission - 09/17/15 0900      Core Components/Risk Factors/Patient Goals on Admission  Sedentary Yes  Ms Metheney's goal is to increase her walking. she may plan to walk in their pool this summer.   Intervention Provide advice, education, support and counseling about physical activity/exercise needs.;Develop an individualized exercise prescription for aerobic and resistive training based on initial evaluation findings, risk stratification, comorbidities and participant's personal goals.   Expected Outcomes Achievement of increased cardiorespiratory fitness and enhanced flexibility, muscular endurance and strength shown through measurements of functional capacity and personal statement of participant.   Increase Strength and Stamina Yes   Intervention Provide advice, education, support and counseling about  physical activity/exercise needs.;Develop an individualized exercise prescription for aerobic and resistive training based on initial evaluation findings, risk stratification, comorbidities and participant's personal goals.   Expected Outcomes Achievement of increased cardiorespiratory fitness and enhanced flexibility, muscular endurance and strength shown through measurements of functional capacity and personal statement of participant.   Improve shortness of breath with ADL's Yes   Intervention Provide education, individualized exercise plan and daily activity instruction to help decrease symptoms of SOB with activities of daily living.   Expected Outcomes Short Term: Achieves a reduction of symptoms when performing activities of daily living.   Develop more efficient breathing techniques such as purse lipped breathing and diaphragmatic breathing; and practicing self-pacing with activity Yes   Intervention Provide education, demonstration and support about specific breathing techniuqes utilized for more efficient breathing. Include techniques such as pursed lipped breathing, diaphragmatic breathing and self-pacing activity.   Expected Outcomes Short Term: Participant will be able to demonstrate and use breathing techniques as needed throughout daily activities.   Increase knowledge of respiratory medications and ability to use respiratory devices properly  Yes  Uses oxygen for sleep and activity - 1l/m; she needs to be encouraged to use with activity on continuous flow; uses Advair, Spiriva, Albuterol MDIs and SVN with Albuterol.   Intervention Provide education and demonstration as needed of appropriate use of medications, inhalers, and oxygen therapy.   Expected Outcomes Short Term: Achieves understanding of medications use. Understands that oxygen is a medication prescribed by physician. Demonstrates appropriate use of inhaler and oxygen therapy.   Hypertension Yes   Intervention Provide education  on lifestyle modifcations including regular physical activity/exercise, weight management, moderate sodium restriction and increased consumption of fresh fruit, vegetables, and low fat dairy, alcohol moderation, and smoking cessation.;Monitor prescription use compliance.   Expected Outcomes Short Term: Continued assessment and intervention until BP is < 140/43mm HG in hypertensive participants. < 130/41mm HG in hypertensive participants with diabetes, heart failure or chronic kidney disease.;Long Term: Maintenance of blood pressure at goal levels.       Personal Goals Discharge:     Goals and Risk Factor Review - 01/20/16 0752      Core Components/Risk Factors/Patient Goals Review   Personal Goals Review Hypertension   Review Ms Barzee is compliant with her Blood Pressure medicine and has acceptable BP during [facility]. She also manages her diet and salt.      Nutrition & Weight - Outcomes:     Pre Biometrics - 09/17/15 1608      Pre Biometrics   Height 5\' 3"  (1.6 m)   Weight 142 lb (64.4 kg)   Waist Circumference 36 inches   Hip Circumference 41.25 inches   Waist to Hip Ratio 0.87 %   BMI (Calculated) 25.2         Post Biometrics - 01/13/16 1058       Post  Biometrics   Height 5\' 3"  (1.6  m)   Weight 140 lb 12.8 oz (63.9 kg)   Waist Circumference 34.5 inches   Hip Circumference 41 inches   Waist to Hip Ratio 0.84 %   BMI (Calculated) 25      Nutrition:     Nutrition Therapy & Goals - 09/17/15 0900      Nutrition Therapy   Diet Prefers not to see the dietitian; Husband cooks healthy meals with vegetables; she does not use salt, but does like sweets; her gopal is to loss 5lbs.      Nutrition Discharge:   Education Questionnaire Score:     Knowledge Questionnaire Score - 01/20/16 0743      Knowledge Questionnaire Score   Post Score 9/10      Goals reviewed with patient; copy given to patient.

## 2016-01-29 NOTE — Progress Notes (Signed)
Pulmonary Individual Treatment Plan  Patient Details  Name: Fani Rotondo MRN: [id number] Date of Birth: [birth year] Referring Provider:   Flowsheet Row Pulmonary Rehab from 09/17/2015 in [facility]  Referring Provider  Clayborn Bigness      Initial Encounter Date:  Flowsheet Row Pulmonary Rehab from 09/17/2015 in [facility] Card[facility] and Pulmonary Rehab  Date  09/17/15  Referring Provider  Clayborn Bigness      Visit Diagnosis: COPD, moderate ([place])  Patient's Home Medications on Admission:  Current Outpatient Prescriptions:    albuterol (PROVENTIL HFA;VENTOLIN HFA) 108 (90 BASE) MCG/ACT inhaler, Inhale 2 puffs into the lungs every 6 (six) hours as needed for shortness of breath., Disp: , Rfl:    aspirin EC 81 MG tablet, Take 81 mg by mouth daily., Disp: , Rfl:    escitalopram (LEXAPRO) 20 MG tablet, Take 1 tablet (20 mg total) by mouth daily., Disp: 90 tablet, Rfl: 2   Fluticasone-Salmeterol (ADVAIR) 250-50 MCG/DOSE AEPB, Inhale 1 puff into the lungs 2 (two) times daily., Disp: , Rfl:    losartan (COZAAR) 100 MG tablet, Take 1 tablet (100 mg total) by mouth daily., Disp: 90 tablet, Rfl: 2   pantoprazole (PROTONIX) 40 MG tablet, Take 1 tablet (40 mg total) by mouth daily., Disp: 90 tablet, Rfl: 2   raloxifene (EVISTA) 60 MG tablet, Take 1 tablet (60 mg total) by mouth daily., Disp: 90 tablet, Rfl: 2   rosuvastatin (CRESTOR) 20 MG tablet, Take 1 tablet (20 mg total) by mouth daily., Disp: 90 tablet, Rfl: 2   tiotropium (SPIRIVA) 18 MCG inhalation capsule, Place 18 mcg into inhaler and inhale daily., Disp: , Rfl:    traMADol (ULTRAM) 50 MG tablet, 1-2 tabs q6 hours as needed for pain, Disp: 100 tablet, Rfl: 0  Past Medical History: Past Medical History:  Diagnosis Date   COPD (chronic obstructive pulmonary disease) (HCC)    Hyperlipidemia    Osteoporosis     Tobacco Use: History  Smoking Status   Former Smoker   Types: Cigarettes   Quit date: 03/18/2001   Smokeless Tobacco   Never Used    Labs: Recent Review Flowsheet Data    Labs for ITP Cardiac and Pulmonary Rehab Latest Ref Rng & Units 03/13/2014 09/12/2014 03/19/2015 11/07/2015   Cholestrol <200 mg/dL 269(A) 168 179 165   LDLCALC 0 - 99 mg/dL 151 67 76 -   HDL >39 mg/dL 89(A) 84 84 -   Trlycerides <150 mg/dL 144 86 96 94       ADL UCSD:     Pulmonary Assessment Scores    Row Name 09/17/15 0900 11/08/15 1051 01/20/16 0743     ADL UCSD   ADL Phase Entry Mid Exit   SOB Score total 27 44 39   Rest 0 0 0   Walk 2 3 0   Stairs _0 Bath _1 Dress _2 Shop _3 Pulmonary Function Assessment:     Pulmonary Function Assessment - 09/17/15 0900      Pulmonary Function Tests   RV% 167 %   DLCO% 28 %     Initial Spirometry Results   FVC% 61 %   FEV1% 36 %   FEV1/FVC Ratio 46     Post Bronchodilator Spirometry Results   FVC% 68 %   FEV1% 37 %   FEV1/FVC Ratio 43     Breath   Bilateral Breath  Sounds Decreased;Clear   Shortness of Breath Yes;Fear of Shortness of Breath;Limiting activity      Exercise Target Goals:    Exercise Program Goal: Individual exercise prescription set with THRR, safety & activity barriers. Participant demonstrates ability to understand and report RPE using BORG scale, to self-measure pulse accurately, and to acknowledge the importance of the exercise prescription.  Exercise Prescription Goal: Starting with aerobic activity 30 plus minutes a day, 3 days per week for initial exercise prescription. Provide home exercise prescription and guidelines that participant acknowledges understanding prior to discharge.  Activity Barriers & Risk Stratification:     Activity Barriers & Cardiac Risk Stratification - 09/17/15 0900      Activity Barriers & Cardiac Risk Stratification   Activity Barriers Shortness of Breath;Deconditioning;Muscular Weakness   Cardiac Risk Stratification Moderate      6 Minute Walk:     6 Minute  Walk    Row Name 09/17/15 1536 11/08/15 1051 01/13/16 1053     6 Minute Walk   Phase  -- Mid Program Discharge   Distance 858 feet 940 feet 920 feet   Distance % Change  -- 9.5 % 7 %   Walk Time 6 minutes 6 minutes 6 minutes   # of Rest Breaks 0 0 0   MPH 1.63 1.78 1.74   METS 2.48 2.46 2.8   RPE _0 Perceived Dyspnea   -- 3 3   VO2 Peak 8.7 8.6 9.8   Symptoms No No No   Resting HR 100 bpm 88 bpm 92 bpm   Resting BP 118/66 122/70 112/64   Max Ex. HR 120 bpm 120 bpm 126 bpm   Max Ex. BP 132/68 110/76 146/72   2 Minute Post BP  -- 100/70 130/70     Interval HR   Baseline HR 100 88 92   1 Minute HR 110 106 118   2 Minute HR 114 112 121   3 Minute HR 116 110 122   4 Minute HR 118 110 126   5 Minute HR 120 119 126   6 Minute HR 118 120 122   2 Minute Post HR 106 103 104   Interval Heart Rate? Yes Yes Yes     Interval Oxygen   Interval Oxygen? Yes Yes Yes   Baseline Oxygen Saturation % 92 % 93 % 94 %   Baseline Liters of Oxygen 1 L 0 L  Room Air 2 L   1 Minute Oxygen Saturation % 95 % 93 % 91 %   1 Minute Liters of Oxygen 1 L 0 L 2 L   2 Minute Oxygen Saturation % 93 % 87 % 94 %   2 Minute Liters of Oxygen 1 L 0 L 2 L   3 Minute Oxygen Saturation % 91 % 84 % 91 %   3 Minute Liters of Oxygen 1 L 0 L 2 L   4 Minute Oxygen Saturation % 88 % 82 % 91 %   4 Minute Liters of Oxygen 1 L 0 L 2 L   5 Minute Oxygen Saturation % 88 % 84 % 90 %   5 Minute Liters of Oxygen 1 L 2 L 2 L   6 Minute Oxygen Saturation % 88 % 84 % 88 %   6 Minute Liters of Oxygen 1 L 2 L 2 L   2 Minute Post Oxygen Saturation % 94 % 97 % 95 %   2 Minute Post Liters  of Oxygen 1 L 2 L 2 L      Initial Exercise Prescription:     Initial Exercise Prescription - 09/17/15 1500      Date of Initial Exercise RX and Referring Provider   Date 09/17/15   Referring Provider Clayborn Bigness     Oxygen   Oxygen Continuous   Liters 1     Treadmill   MPH 1.5   Grade 0   Minutes 15   METs 2.4      NuStep   Level 3   Watts 30   Minutes 15   METs 2.4     Recumbant Elliptical   Level 1   RPM 50   Watts 30   Minutes 15   METs 2.4     Biostep-RELP   Level 3   Watts 30   Minutes 15   METs 2.4     Prescription Details   Frequency (times per week) 3-5   Duration Progress to 45 minutes of aerobic exercise without signs/symptoms of physical distress     Intensity   THRR 40-80% of Max Heartrate 120-141   Ratings of Perceived Exertion 11-15   Perceived Dyspnea 0-4     Progression   Progression Continue to progress workloads to maintain intensity without signs/symptoms of physical distress.     Resistance Training   Training Prescription Yes   Weight 2   Reps 10-12      Perform Capillary Blood Glucose checks as needed.  Exercise Prescription Changes:     Exercise Prescription Changes    Row Name 09/23/15 1200 10/01/15 1500 10/16/15 1500 10/30/15 1000 11/08/15 1000     Exercise Review   Progression  -- Yes Yes Yes Yes     Response to Exercise   Blood Pressure (Admit)  -- 124/80 120/68 110/64  --   Blood Pressure (Exercise)  -- 160/84 148/82 120/66  --   Blood Pressure (Exit)  -- 102/60 100/70 114/64  --   Heart Rate (Admit)  -- 94 bpm 92 bpm 86 bpm  --   Heart Rate (Exercise)  -- 118 bpm 120 bpm 119 bpm  --   Heart Rate (Exit)  -- 102 bpm 103 bpm 103 bpm  --   Oxygen Saturation (Admit)  -- 95 % 91 % 92 %  --   Oxygen Saturation (Exercise)  -- 88 % 89 % 94 %  --   Oxygen Saturation (Exit)  -- 96 % 97 % 96 %  --   Rating of Perceived Exertion (Exercise)  -- _0 --   Perceived Dyspnea (Exercise)  -- _1 --   Symptoms  -- shortness of breath on treadmill  -- shortness of breath on treadmill shortness of breath on treadmill   Comments  --  --  --  -- Home Exercise Guidelines given 11/04/15   Duration  -- Progress to 45 minutes of aerobic exercise without signs/symptoms of physical distress Progress to 45 minutes of aerobic exercise without signs/symptoms of  physical distress Progress to 45 minutes of aerobic exercise without signs/symptoms of physical distress Progress to 45 minutes of aerobic exercise without signs/symptoms of physical distress   Intensity  -- THRR unchanged THRR unchanged THRR unchanged THRR unchanged     Progression   Progression  -- Continue to progress workloads to maintain intensity without signs/symptoms of physical distress. Continue to progress workloads to maintain intensity without signs/symptoms of physical distress. Continue to progress workloads to  maintain intensity without signs/symptoms of physical distress. Continue to progress workloads to maintain intensity without signs/symptoms of physical distress.   Average METs  -- 2.1 2.8 2.7 2.7     Resistance Training   Training Prescription _0    Weight _1 Reps 10-12 10-12 10-12 10-12 10-12     Interval Training   Interval Training  -- No No No No     Oxygen   Oxygen _2    Liters _3 Treadmill   MPH 1.2 1.2 1.4 1.4 1.4   Grade 0 0 0 0.5 0.5   Minutes _4 METs 2.4 1.92 2.07 2.17 2.17     NuStep   Level _5 Watts 25 27 36  --  --   Minutes _6 METs 2.4 2 2.7 2.9 2.9     Recumbant Elliptical   Level 1  omitted today due to time  --  --  --  --   RPM 50  --  --  --  --   Watts 30  --  --  --  --   Minutes 15  --  --  --  --   METs 2.4  --  --  --  --     Biostep-RELP   Level _7 Watts _8 --  --   Minutes _9 METs 2.4 2 2._10 Home Exercise Plan   Plans to continue exercise at  --  -- Home  walking in pool at home Home  walking in pool at home Home  walking in pool at home   Frequency  --  -- Add 2 additional days to program exercise sessions. Add 2 additional days to program exercise sessions. Add 2 additional days to program exercise sessions.   [place] Name 11/13/15 1500 11/27/15 1400 12/11/15  1500 12/24/15 1500 12/30/15 1100     Exercise Review   Progression Yes Yes Yes Yes  --     Response to Exercise   Blood Pressure (Admit) 118/64 110/68 102/74 118/60  --   Blood Pressure (Exercise) 140/80 144/70 138/70 152/70  --   Blood Pressure (Exit) 124/70 110/70 106/64 106/62  --   Heart Rate (Admit) 88 bpm 85 bpm 93 bpm 92 bpm  --   Heart Rate (Exercise) 134 bpm 128 bpm 128 bpm 139 bpm  --   Heart Rate (Exit) 108 bpm 91 bpm 107 bpm 90 bpm  --   Oxygen Saturation (Admit) 93 % 92 % 92 % 93 %  --   Oxygen Saturation (Exercise) 92 % 87 % 91 % 88 %  --   Oxygen Saturation (Exit) 95 % 95 % 97 % 92 %  --   Rating of Perceived Exertion (Exercise) _11 --   Perceived Dyspnea (Exercise) _12 --   Symptoms shortness of breath on treadmill shortness of breath on treadmill shortness of breath on treadmill shortness of breath on treadmill  --   Comments Home Exercise Guidelines given 11/04/15 Home Exercise Guidelines given 11/04/15 Home Exercise Guidelines given 11/04/15 Home Exercise Guidelines given 11/04/15  --   Duration Progress to 45 minutes of aerobic exercise without signs/symptoms  of physical distress Progress to 45 minutes of aerobic exercise without signs/symptoms of physical distress Progress to 45 minutes of aerobic exercise without signs/symptoms of physical distress Progress to 45 minutes of aerobic exercise without signs/symptoms of physical distress  --   Intensity THRR unchanged THRR unchanged THRR unchanged THRR unchanged  --     Progression   Progression Continue to progress workloads to maintain intensity without signs/symptoms of physical distress. Continue to progress workloads to maintain intensity without signs/symptoms of physical distress. Continue to progress workloads to maintain intensity without signs/symptoms of physical distress. Continue to progress workloads to maintain intensity without signs/symptoms of physical distress.  --   Average METs 2.62 2.7 2.78  2.45  --     Resistance Training   Training Prescription Yes Yes Yes Yes  --   Weight 2 3 lbs 3 lbs  2 lbs left hand 3 lbs  2 lbs left hand  --   Reps 10-12 10-15 10-15 10-15  --     Interval Training   Interval Training No No No No  --     Oxygen   Oxygen Continuous Continuous Continuous Continuous  --   Liters _0 3L on treadmill 2  --     Treadmill   MPH 1.4 1.5 1.5 1.5  --   Grade 0.5 0.5 0.5 1  --   Minutes _1 --   METs 2.17 2.2 2.25 2.36  --     NuStep   Level _2 --   Minutes _3 --   METs 2.7 2.9 3.1 2.6  --     Biostep-RELP   Level _4 --   Minutes _5 --   METs _6 --     Home Exercise Plan   Plans to continue exercise at Home  walking in pool at home Home  walking in pool at home Home  walking in pool at home Home  walking in pool at home Home  has equipment at home   Frequency Add 2 additional days to program exercise sessions. Add 2 additional days to program exercise sessions. Add 2 additional days to program exercise sessions. Add 2 additional days to program exercise sessions. Add 2 additional days to program exercise sessions.   [place] Name 01/08/16 1400 01/23/16 1200           Exercise Review   Progression Yes Yes        Response to Exercise   Blood Pressure (Admit) 112/70 120/68      Blood Pressure (Exercise) 138/78 140/68      Blood Pressure (Exit) 116/64 124/82      Heart Rate (Admit) 81 bpm 88 bpm      Heart Rate (Exercise) 121 bpm 127 bpm      Heart Rate (Exit) 106 bpm 107 bpm      Oxygen Saturation (Admit) 92 % 93 %      Oxygen Saturation (Exercise) 93 % 91 %      Oxygen Saturation (Exit) 94 % 94 %      Rating of Perceived Exertion (Exercise) 11 11      Perceived Dyspnea (Exercise) 3 3      Duration Progress to 45 minutes of aerobic exercise without signs/symptoms of physical distress Progress to 45 minutes of aerobic exercise without signs/symptoms of physical distress  Intensity THRR  unchanged THRR unchanged        Progression   Progression Continue to progress workloads to maintain intensity without signs/symptoms of physical distress. Continue to progress workloads to maintain intensity without signs/symptoms of physical distress.      Average METs 2.5 2.9        Resistance Training   Training Prescription Yes Yes      Weight 3 3      Reps 10-15 10-15        Interval Training   Interval Training No No        Oxygen   Oxygen Continuous Continuous      Liters 2 2        Treadmill   MPH 1.8 1.8      Grade 1 1      Minutes 15 15      METs  -- 2.63        NuStep   Level 6 6      Minutes 15 15      METs  -- 3.2        Biostep-RELP   Level 5 5      Minutes 15 15         Exercise Comments:     Exercise Comments    Row Name 09/17/15 1605 09/23/15 1213 10/01/15 1551 10/14/15 1222 10/16/15 1515   Exercise Comments Pt did not report any musculoskeletal issues - expected to progress well with exercise.  Noticed that she felt better during 6 min walk with 1 L contiuous oxygen. First full day of exercise!  Patient was oriented to gym and equipment including functions, settings, policies, and procedures.  Patient's individual exercise prescription and treatment plan were reviewed.  All starting workloads were established based on the results of the 6 minute walk test done at initial orientation visit.  The plan for exercise progression was also introduced and progression will be customized based on patient's performance and goals. Jenelle is off to a good start with her rehab.  She seems to be enjoying coming to class.  She has already progressed to a full 15 min on the treadmill!  We will continue to work with her to increase her workloads to build up strength and stamina. Maleeka has reported that she is having less SOB with daily activities. She has been feeling like she has more energy and has been walking in her pool at home on the days that she does not come to class.   Eldoris has been doing great with exercise.  She continues to make good progression and we hope she will continue to do so.   Row Name 10/30/15 1040 11/04/15 1157 11/08/15 1054 11/13/15 1524 11/27/15 1444   Exercise Comments Arantxa continues to do well with exercise.  She is walking in the pool at home on her off days.  We will continue to monitor for progress. Reviewed home exercise with pt today.  Pt plans to walk in pool and at home for exercise.  She is planning to get a home gym and stationary bike to use as well.  Reviewed THR, pulse, RPE, sign and symptoms, pulse oximetry, and when to call 911 or MD.  Also discussed weather considerations and indoor options.  Pt voiced understanding. Laporsha completed her mid program 6 min walk test today.  She improved by 49f.  She started her walk on room air, but did desaturate and finished on 2L.  She has been exercising with  2L. Arretta continues to do well with exercise.  She is up to level 4 on the BioStep.  We will continue to monitor for progress. Lataunya is making good progress with exercise.  She is walking at home and is up to level 5 on the BioStep.  We will continue to monitor for progression.   Row Name 12/11/15 1526 12/24/15 1520 12/30/15 1151 01/08/16 1410 01/13/16 1059   Exercise Comments Akansha continues to do well with exercise.  She is up to level 6 now on the stepper.  We will continue to monitor for progression. Sahana has been doing well with exercise.  She has not been feeling 100% this week.  We will continue to monitor for progression. Elizah has purchased a stationary bike to use at home.  Home exercise was discussed incl heart rate RPE and safety. Lasha continues to progress well with exercise. 6 min walk done today. See 6 min walk data for detailed report. Progress and future exercise goals were discussed with patient.    [place] Name 01/23/16 1224           Exercise Comments Sandrine continues to progress well with exercise.          Discharge  Exercise Prescription (Final Exercise Prescription Changes):     Exercise Prescription Changes - 01/23/16 1200      Exercise Review   Progression Yes     Response to Exercise   Blood Pressure (Admit) 120/68   Blood Pressure (Exercise) 140/68   Blood Pressure (Exit) 124/82   Heart Rate (Admit) 88 bpm   Heart Rate (Exercise) 127 bpm   Heart Rate (Exit) 107 bpm   Oxygen Saturation (Admit) 93 %   Oxygen Saturation (Exercise) 91 %   Oxygen Saturation (Exit) 94 %   Rating of Perceived Exertion (Exercise) 11   Perceived Dyspnea (Exercise) 3   Duration Progress to 45 minutes of aerobic exercise without signs/symptoms of physical distress   Intensity THRR unchanged     Progression   Progression Continue to progress workloads to maintain intensity without signs/symptoms of physical distress.   Average METs 2.9     Resistance Training   Training Prescription Yes   Weight 3   Reps 10-15     Interval Training   Interval Training No     Oxygen   Oxygen Continuous   Liters 2     Treadmill   MPH 1.8   Grade 1   Minutes 15   METs 2.63     NuStep   Level 6   Minutes 15   METs 3.2     Biostep-RELP   Level 5   Minutes 15       Nutrition:  Target Goals: Understanding of nutrition guidelines, daily intake of sodium <1574m, cholesterol <2088m calories 30% from fat and 7% or less from saturated fats, daily to have 5 or more servings of fruits and vegetables.  Biometrics:     Pre Biometrics - 09/17/15 1608      Pre Biometrics   Height _0  (1.6 m)   Weight 142 lb (64.4 kg)   Waist Circumference 36 inches   Hip Circumference 41.25 inches   Waist to Hip Ratio 0.87 %   BMI (Calculated) 25.2         Post Biometrics - 01/13/16 1058       Post  Biometrics   Height _1  (1.6 m)   Weight 140 lb 12.8 oz (63.9 kg)   Waist Circumference  34.5 inches   Hip Circumference 41 inches   Waist to Hip Ratio 0.84 %   BMI (Calculated) 25      Nutrition Therapy Plan and  Nutrition Goals:     Nutrition Therapy & Goals - 09/17/15 0900      Nutrition Therapy   Diet Prefers not to see the dietitian; Husband cooks healthy meals with vegetables; she does not use salt, but does like sweets; her gopal is to loss 5lbs.      Nutrition Discharge: Rate Your Plate Scores:   Psychosocial: Target Goals: Acknowledge presence or absence of depression, maximize coping skills, provide positive support system. Participant is able to verbalize types and ability to use techniques and skills needed for reducing stress and depression.  Initial Review & Psychosocial Screening:     Initial Psych Review & Screening - 09/17/15 0900      Family Dynamics   Good Support System? Yes   Comments Ms Crownover has good support from her children and husband. She states she has no depression, although she does miss walking distances without shortness of breath and is looking forward to partivccipation in [facility].     Barriers   Psychosocial barriers to participate in program The patient should benefit from training in stress management and relaxation.;There are no identifiable barriers or psychosocial needs.     Screening Interventions   Interventions Encouraged to exercise      Quality of Life Scores:     Quality of Life - 01/20/16 0744      Quality of Life Scores   Health/Function Pre 20.19 %   Health/Function Post 17.78 %   Health/Function % Change -11.94 %   Socioeconomic Pre 26 %   Socioeconomic Post 22.57 %   Socioeconomic % Change  -13.19 %   Psych/Spiritual Pre 24.86 %   Psych/Spiritual Post 24.43 %   Psych/Spiritual % Change -1.73 %   Family Pre 27.6 %   Family Post 29.5 %   Family % Change 6.88 %   GLOBAL Pre 23.26 %   GLOBAL Post 21.74 %   GLOBAL % Change -6.53 %      PHQ-9: Recent Review Flowsheet Data    Depression screen [facility] 2/9 01/20/2016 09/17/2015 09/12/2014   Decreased Interest 0 0 0   Down, Depressed, Hopeless 0 0 0   PHQ - 2 Score 0 0 0    Altered sleeping 1 0 -   Tired, decreased energy 1 1 -   Change in appetite 0 0 -   Feeling bad or failure about yourself  0 0 -   Trouble concentrating 0 0 -   Moving slowly or fidgety/restless 0 0 -   Suicidal thoughts 0 0 -   PHQ-9 Score 2 1 -   Difficult doing work/chores Not difficult at all Somewhat difficult -      Psychosocial Evaluation and Intervention:     Psychosocial Evaluation - 01/20/16 1130      Discharge Psychosocial Assessment & Intervention   Comments Counselor met with Ms. Grosshans today for a discharge summary.  She reports this program has been "great" for her with the consistent exercise and the educational and social components.  She reports having more energy; walking further and generally being able to do more.  She has some current stress with a grandson getting married at her house soon; but she reports having lots of help and learning to delegate better and know her own limits.   She also states she  is sleeping better now than before she came into this program.  Counselor commended Ms. Abeln for all her progress and encouraged consistent exercise beyond this program.  She reports having some gym equipment at home to maintain consistency in her exercise habits.         Psychosocial Re-Evaluation:     Psychosocial Re-Evaluation    Row Name 10/14/15 1144 11/11/15 1114 11/25/15 1145 01/13/16 0726       Psychosocial Re-Evaluation   Comments Counselor follow up with Ms. Jester reporting she is "surprised" how much she is enjoying this class and how much progress she is already experiencing.  She stated she was able to walk 25 laps in her pool this past weekend and her family have mentioned how much stronger her voice sounds on the phone lately.  Ms. [place]  maintains a positive attitude and reports minimal stress in her life at this time.  Counselor will continue to follow with Ms. Fine and commended her on her progress and commitment to consistency in  exercising, both here and at home.   Counselor follow up with Ms. Megan Salon.  She stated she was walking better and breathing better since coming into this class.  She was able to go to the beach recently for a short vacation and states she was amazed at how well she did during that trip.  Her mood continues to be positive.  She is contending with some muscle tension in her neck with her Dr. recommending a massage in the near future.  Counselor will follow with Ms. Flud during the course of treatment in thie program.   Counselor follow up with Ms. Bushee reporting continued positive results from this program.  She states she "loves it" and is able to do so much more now since beginning.  She even "cleaned her wood floors" this past week and her spouse was amazed.  Her mood is typically positive and her Dr. states "all is well."  She recently began to have difficulty with getting to sleep and counselor encouraged a natural OTC sleep aid if this does not improve as well as speaking to Dr. or Pharmacist.  Counselor will continue to follow with Ms. Megan Salon.   Ms Hornbrook is nearing graduation. She has learned to pace herself with family requests and to say "no" if too shortof breath. Through exercise and COPD education, Ms Sangster has added qualtity to her life.       Education: Education Goals: Education classes will be provided on a weekly basis, covering required topics. Participant will state understanding/return demonstration of topics presented.  Learning Barriers/Preferences:     Learning Barriers/Preferences - 09/17/15 0900      Learning Barriers/Preferences   Learning Barriers None   Learning Preferences Group Instruction;Individual Instruction;Pictoral;Skilled Demonstration;Verbal Instruction;Video;Written Material      Education Topics: Initial Evaluation Education: - Verbal, written and demonstration of respiratory meds, RPE/PD scales, oximetry and breathing techniques. Instruction  on use of nebulizers and MDIs: cleaning and proper use, rinsing mouth with steroid doses and importance of monitoring MDI activations. Flowsheet Row Pulmonary Rehab from 01/27/2016 in [facility] Cardiac and Pulmonary Rehab  Date  09/17/15  Educator  LB  Instruction Review Code  2- meets goals/outcomes      General Nutrition Guidelines/Fats and Fiber: -Group instruction provided by verbal, written material, models and posters to present the general guidelines for heart healthy nutrition. Gives an explanation and review of dietary fats and fiber. Flowsheet Row Pulmonary Rehab from 01/27/2016 in [facility]  Cardiac and Pulmonary Rehab  Date  11/04/15  Educator  CR  Instruction Review Code  2- meets goals/outcomes      Controlling Sodium/Reading Food Labels: -Group verbal and written material supporting the discussion of sodium use in heart healthy nutrition. Review and explanation with models, verbal and written materials for utilization of the food label. Flowsheet Row Pulmonary Rehab from 01/27/2016 in [facility] Cardiac and Pulmonary Rehab  Date  09/30/15  Educator  CR  Instruction Review Code  2- meets goals/outcomes      Exercise Physiology & Risk Factors: - Group verbal and written instruction with models to review the exercise physiology of the cardiovascular system and associated critical values. Details cardiovascular disease risk factors and the goals associated with each risk factor.   Aerobic Exercise & Resistance Training: - Gives group verbal and written discussion on the health impact of inactivity. On the components of aerobic and resistive training programs and the benefits of this training and how to safely progress through these programs.   Flexibility, Balance, General Exercise Guidelines: - Provides group verbal and written instruction on the benefits of flexibility and balance training programs. Provides general exercise guidelines with specific guidelines to those with heart or  lung disease. Demonstration and skill practice provided.   Stress Management: - Provides group verbal and written instruction about the health risks of elevated stress, cause of high stress, and healthy ways to reduce stress.   Depression: - Provides group verbal and written instruction on the correlation between heart/lung disease and depressed mood, treatment options, and the stigmas associated with seeking treatment.   Exercise & Equipment Safety: - Individual verbal instruction and demonstration of equipment use and safety with use of the equipment. Flowsheet Row Pulmonary Rehab from 01/27/2016 in [facility] Cardiac and Pulmonary Rehab  Date  09/17/15  Educator  LB  Instruction Review Code  2- meets goals/outcomes      Infection Prevention: - Provides verbal and written material to individual with discussion of infection control including proper hand washing and proper equipment cleaning during exercise session. Flowsheet Row Pulmonary Rehab from 01/27/2016 in [facility] Cardiac and Pulmonary Rehab  Date  09/17/15  Educator  LB  Instruction Review Code  2- meets goals/outcomes      Falls Prevention: - Provides verbal and written material to individual with discussion of falls prevention and safety. Flowsheet Row Pulmonary Rehab from 01/27/2016 in [facility] Cardiac and Pulmonary Rehab  Date  09/17/15  Educator  LB  Instruction Review Code  2- meets goals/outcomes      Diabetes: - Individual verbal and written instruction to review signs/symptoms of diabetes, desired ranges of glucose level fasting, after meals and with exercise. Advice that pre and post exercise glucose checks will be done for 3 sessions at entry of program.   Chronic Lung Diseases: - Group verbal and written instruction to review new updates, new respiratory medications, new advancements in procedures and treatments. Provide informative websites and "800" numbers of self-education. Flowsheet Row Pulmonary Rehab from  01/27/2016 in [facility] Cardiac and Pulmonary Rehab  Date  01/27/16  Educator  LB  Instruction Review Code  2- meets goals/outcomes      Lung Procedures: - Group verbal and written instruction to describe testing methods done to diagnose lung disease. Review the outcome of test results. Describe the treatment choices: Pulmonary Function Tests, ABGs and oximetry. Flowsheet Row Pulmonary Rehab from 01/27/2016 in [facility] Cardiac and Pulmonary Rehab  Date  11/01/15  Educator  Erline Levine  Instruction Review  Code  2- meets goals/outcomes      Energy Conservation: - Provide group verbal and written instruction for methods to conserve energy, plan and organize activities. Instruct on pacing techniques, use of adaptive equipment and posture/positioning to relieve shortness of breath.   Triggers: - Group verbal and written instruction to review types of environmental controls: home humidity, furnaces, filters, dust mite/pet prevention, HEPA vacuums. To discuss weather changes, air quality and the benefits of nasal washing.   Exacerbations: - Group verbal and written instruction to provide: warning signs, infection symptoms, calling MD promptly, preventive modes, and value of vaccinations. Review: effective airway clearance, coughing and/or vibration techniques. Create an Sports administrator. Flowsheet Row Pulmonary Rehab from 01/27/2016 in [facility] Cardiac and Pulmonary Rehab  Date  10/07/15  Educator  LB  Instruction Review Code  2- meets goals/outcomes      Oxygen: - Individual and group verbal and written instruction on oxygen therapy. Includes supplement oxygen, available portable oxygen systems, continuous and intermittent flow rates, oxygen safety, concentrators, and Medicare reimbursement for oxygen. Flowsheet Row Pulmonary Rehab from 01/27/2016 in [facility] Cardiac and Pulmonary Rehab  Date  09/17/15  Educator  LB  Instruction Review Code  2- meets goals/outcomes      Respiratory Medications: - Group  verbal and written instruction to review medications for lung disease. Drug class, frequency, complications, importance of spacers, rinsing mouth after steroid MDI's, and proper cleaning methods for nebulizers. Flowsheet Row Pulmonary Rehab from 01/27/2016 in [facility] Cardiac and Pulmonary Rehab  Date  09/17/15  Educator  LB  Instruction Review Code  2- meets goals/outcomes      AED/CPR: - Group verbal and written instruction with the use of models to demonstrate the basic use of the AED with the basic ABC's of resuscitation. Flowsheet Row Pulmonary Rehab from 01/27/2016 in [facility] Cardiac and Pulmonary Rehab  Date  11/15/15  Educator  CE  Instruction Review Code  2- meets goals/outcomes      Breathing Retraining: - Provides individuals verbal and written instruction on purpose, frequency, and proper technique of diaphragmatic breathing and pursed-lipped breathing. Applies individual practice skills. Flowsheet Row Pulmonary Rehab from 01/27/2016 in [facility] Cardiac and Pulmonary Rehab  Date  09/17/15  Educator  LB  Instruction Review Code  2- meets goals/outcomes      Anatomy and Physiology of the Lungs: - Group verbal and written instruction with the use of models to provide basic lung anatomy and physiology related to function, structure and complications of lung disease. Flowsheet Row Pulmonary Rehab from 01/27/2016 in [facility] Cardiac and Pulmonary Rehab  Date  10/04/15  Educator  [place]  Instruction Review Code  2- meets goals/outcomes      Heart Failure: - Group verbal and written instruction on the basics of heart failure: signs/symptoms, treatments, explanation of ejection fraction, enlarged heart and cardiomyopathy. Flowsheet Row Pulmonary Rehab from 01/27/2016 in [facility] Cardiac and Pulmonary Rehab  Date  10/11/15 [facility] your numbers]  Educator  C. EnterkinRN  Instruction Review Code  2- meets goals/outcomes      Sleep Apnea: - Individual verbal and written instruction to review  Obstructive Sleep Apnea. Review of risk factors, methods for diagnosing and types of masks and machines for OSA.   Anxiety: - Provides group, verbal and written instruction on the correlation between heart/lung disease and anxiety, treatment options, and management of anxiety. Flowsheet Row Pulmonary Rehab from 01/27/2016 in [facility] Cardiac and Pulmonary Rehab  Date  10/23/15  Educator  [facility]  Instruction Review Code  2- Meets goals/outcomes      Relaxation: - Provides group, verbal and written instruction about the benefits of relaxation for patients with heart/lung disease. Also provides patients with examples of relaxation techniques.   Knowledge Questionnaire Score:     Knowledge Questionnaire Score - 01/20/16 0743      Knowledge Questionnaire Score   Post Score 9/10       Core Components/Risk Factors/Patient Goals at Admission:     Personal Goals and Risk Factors at Admission - 09/17/15 0900      Core Components/Risk Factors/Patient Goals on Admission   Sedentary Yes  Ms Sievert's goal is to increase her walking. she may plan to walk in their pool this summer.   Intervention Provide advice, education, support and counseling about physical activity/exercise needs.;Develop an individualized exercise prescription for aerobic and resistive training based on initial evaluation findings, risk stratification, comorbidities and participant's personal goals.   Expected Outcomes Achievement of increased cardiorespiratory fitness and enhanced flexibility, muscular endurance and strength shown through measurements of functional capacity and personal statement of participant.   Increase Strength and Stamina Yes   Intervention Provide advice, education, support and counseling about physical activity/exercise needs.;Develop an individualized exercise prescription for aerobic and resistive training based on initial evaluation findings, risk stratification, comorbidities and participant's  personal goals.   Expected Outcomes Achievement of increased cardiorespiratory fitness and enhanced flexibility, muscular endurance and strength shown through measurements of functional capacity and personal statement of participant.   Improve shortness of breath with ADL's Yes   Intervention Provide education, individualized exercise plan and daily activity instruction to help decrease symptoms of SOB with activities of daily living.   Expected Outcomes Short Term: Achieves a reduction of symptoms when performing activities of daily living.   Develop more efficient breathing techniques such as purse lipped breathing and diaphragmatic breathing; and practicing self-pacing with activity Yes   Intervention Provide education, demonstration and support about specific breathing techniuqes utilized for more efficient breathing. Include techniques such as pursed lipped breathing, diaphragmatic breathing and self-pacing activity.   Expected Outcomes Short Term: Participant will be able to demonstrate and use breathing techniques as needed throughout daily activities.   Increase knowledge of respiratory medications and ability to use respiratory devices properly  Yes  Uses oxygen for sleep and activity - 1l/m; she needs to be encouraged to use with activity on continuous flow; uses Advair, Spiriva, Albuterol MDIs and SVN with Albuterol.   Intervention Provide education and demonstration as needed of appropriate use of medications, inhalers, and oxygen therapy.   Expected Outcomes Short Term: Achieves understanding of medications use. Understands that oxygen is a medication prescribed by physician. Demonstrates appropriate use of inhaler and oxygen therapy.   Hypertension Yes   Intervention Provide education on lifestyle modifcations including regular physical activity/exercise, weight management, moderate sodium restriction and increased consumption of fresh fruit, vegetables, and low fat dairy, alcohol  moderation, and smoking cessation.;Monitor prescription use compliance.   Expected Outcomes Short Term: Continued assessment and intervention until BP is < 140/76m HG in hypertensive participants. < 130/885mHG in hypertensive participants with diabetes, heart failure or chronic kidney disease.;Long Term: Maintenance of blood pressure at goal levels.      Core Components/Risk Factors/Patient Goals Review:      Goals and Risk Factor Review    Row Name 09/23/15 1213 09/30/15 1000 10/07/15 1000 10/10/15 1318 10/10/15 1329     Core Components/Risk Factors/Patient Goals Review   Personal Goals Review Develop more efficient breathing techniques  such as purse lipped breathing and diaphragmatic breathing and practicing self-pacing with activity. Increase knowledge of respiratory medications and ability to use respiratory devices properly. Develop more efficient breathing techniques such as purse lipped breathing and diaphragmatic breathing and practicing self-pacing with activity.;Improve shortness of breath with ADL's Hypertension;Lipids Hypertension;Lipids   Review Pursed lip breathing techniques were discussed and demonstrated with patient. Patient demonstrated understanding of these techniques. Ms Losee had a go day on the  treadmill and maintained her O2Sat's in the 90's which reassured and gave her more confidence in using the treadmill. she recognizes the importance of increasing her oxygen to 2l/m continuous and how this can help her with activity. Ms Peltz states she is not using her Albuterol Inhaler, only occasionally, instead she is using PLB. She is very aware of weather and her breathing and stays inside on "bad air" days. and does a good job managing her shortness of breath . Lameisha continues to maintain good BP control.  She continues to take her hypertension and cholesterol medications as ordered. Shantee continues to maintain good BP control.  She continues to take her hypertension and  cholesterol medications as ordered.   Expected Outcomes Patient will independenly use pursed lip breathing techniques to help tolerate exercise.  Continue progressing with oxygen use with her exercise goals. Continue using PLB to manage her COPD. COntinued control of her risk factors through meds, exerse and nutrion plan. COntinued control of her risk factors through meds, exerse and nutrion plan.   [place] Name 10/14/15 1615 11/04/15 1157 11/04/15 1529 11/22/15 1123 11/26/15 0723     Core Components/Risk Factors/Patient Goals Review   Personal Goals Review Sedentary Sedentary;Improve shortness of breath with ADL's;Increase Strength and Stamina Hypertension;Develop more efficient breathing techniques such as purse lipped breathing and diaphragmatic breathing and practicing self-pacing with activity.;Increase knowledge of respiratory medications and ability to use respiratory devices properly.;Improve shortness of breath with ADL's Lipids;Hypertension Increase knowledge of respiratory medications and ability to use respiratory devices properly.;Develop more efficient breathing techniques such as purse lipped breathing and diaphragmatic breathing and practicing self-pacing with activity.;Improve shortness of breath with ADL's;Sedentary;Increase Strength and Stamina   Review Ms Ziesmer plans to continue her exercise after [facility]. Her husband is currently looking for a piece of exercise equipment they can use at home. Berlie has not been doing much exercise at home due to the weather recently.  She is getting in the pool when she is able and is planning to buy some equipment for home use.  She has noticed that she is able to do more at home with improved SOB and strength and stamina. Ms Yadao has maintained acceptable BP's at rest and with exercise. She has a good understanding of her MDI's and is very compliant in taking them everyday. She uses PLB during her exercise goals and activities at home. She  has  noticed an improvement in her shortness of breath with activites at home and does adjust her oxygen as needed with activity from 2 to 3l/m. BP readings remain in good levels. Recent blod panel done,reports Cholesterol nu mbers are good.  Maythe continues to be compliant with  her meds, exercise and nutriotion plan.  She has needed to drecrease her BP med by half, her MD is aware. Ms Darius states she is feeling stronger and doing more walk around their house. The other day she mopped their hardwood floor in the kitchen - which she had not done for time time. She still has good and bad  days with her shortness of breath, but with pacing and PLB, she manages the breathing well.   Expected Outcomes Continue exercising with exercise equipment at home. Reviewed home exercise guidelines with Sharni today to add in more at home.  She will also continue to come ot exercise and education to continue to boost her strength and stamina.  -- Continue with meds, exercise plan and nutrition to maintain risk factor control.  --   Row Name 12/30/15 1152 12/30/15 1414 01/03/16 1451 01/03/16 1454 01/03/16 1456     Core Components/Risk Factors/Patient Goals Review   Personal Goals Review Hypertension;Increase Strength and Stamina;Improve shortness of breath with ADL's Hypertension Increase knowledge of respiratory medications and ability to use respiratory devices properly. Improve shortness of breath with ADL's Develop more efficient breathing techniques such as purse lipped breathing and diaphragmatic breathing and practicing self-pacing with activity.   Review Reizy reports imporved stamina with daily activities.  Her Dr reduced her BP med by half due to her lifestyle change of adding exercise.  -- Ms. Valvo takes Albuterol MDI & SVN, Spiriva and Advair for her respiratory issues.  She has a spacer for her inhaler.  She knows to rinse her mouth after taking Advair.  She also wears 1L O2 during the day and 2L O2 while  exercising. Ms. Haft states that she has noticed that she is able to walk with less SOB and is able to do more housework since she has been exercising in pulmonary rehabilation. Ms. Bidwell demonstrates proper technique of PLB.    Expected Outcomes  -- Carlo will continue to have BP readings within normal limits by continuing the lifestyle change of exercise. Ms. Kinn will decrease her chances of exacerbations and SOB if she continues to take her respiratory medications as directed by her doctor.  Continued strength training and cardiovascular exercise sould help Mr. Son have less SOB while performing ADL's. PLB will help Ms. Rossie regain her breath during exercise.    [place] Name 01/20/16 0748 01/20/16 0752           Core Components/Risk Factors/Patient Goals Review   Personal Goals Review Increase Strength and Stamina;Sedentary;Develop more efficient breathing techniques such as purse lipped breathing and diaphragmatic breathing and practicing self-pacing with activity.;Improve shortness of breath with ADL's;Increase knowledge of respiratory medications and ability to use respiratory devices properly. Hypertension      Review Ms Labrador is nearing graduation from [employer]. She has improved her breathing - noticeable with housework, walking, and exercise. She uses her PLB with all activity and is very compliant with her MDI's and is knowledgeable about her MDI's. She wants to continue exercise and knows the importance of exercise in managing her COPD.  Ms Mckiver is compliant with her Blood Pressure medicine and has acceptable BP during [facility]. She also manages her diet and salt.         Core Components/Risk Factors/Patient Goals at Discharge (Final Review):      Goals and Risk Factor Review - 01/20/16 0752      Core Components/Risk Factors/Patient Goals Review   Personal Goals Review Hypertension   Review Ms Belko is compliant with her Blood Pressure medicine and has  acceptable BP during [facility]. She also manages her diet and salt.      ITP Comments:     ITP Comments    Row Name 01/08/16 1411           ITP Comments Sherin continues to progress well with exercise.  Comments: Ms Vanepps graduated from [employer] today and will continue exercise at home.

## 2016-01-31 DIAGNOSIS — J441 Chronic obstructive pulmonary disease with (acute) exacerbation: Secondary | ICD-10-CM | POA: Diagnosis not present

## 2016-03-02 DIAGNOSIS — J441 Chronic obstructive pulmonary disease with (acute) exacerbation: Secondary | ICD-10-CM | POA: Diagnosis not present

## 2016-03-18 DIAGNOSIS — R0602 Shortness of breath: Secondary | ICD-10-CM | POA: Diagnosis not present

## 2016-03-19 ENCOUNTER — Ambulatory Visit (INDEPENDENT_AMBULATORY_CARE_PROVIDER_SITE_OTHER): Payer: PPO | Admitting: Family Medicine

## 2016-03-19 ENCOUNTER — Encounter: Payer: Self-pay | Admitting: Family Medicine

## 2016-03-19 VITALS — BP 147/88 | HR 106 | Temp 98.3°F | Ht 62.7 in | Wt 140.2 lb

## 2016-03-19 DIAGNOSIS — I10 Essential (primary) hypertension: Secondary | ICD-10-CM | POA: Diagnosis not present

## 2016-03-19 DIAGNOSIS — Z1159 Encounter for screening for other viral diseases: Secondary | ICD-10-CM

## 2016-03-19 DIAGNOSIS — M19042 Primary osteoarthritis, left hand: Secondary | ICD-10-CM | POA: Diagnosis not present

## 2016-03-19 DIAGNOSIS — E7800 Pure hypercholesterolemia, unspecified: Secondary | ICD-10-CM | POA: Diagnosis not present

## 2016-03-19 DIAGNOSIS — Z23 Encounter for immunization: Secondary | ICD-10-CM | POA: Diagnosis not present

## 2016-03-19 DIAGNOSIS — K219 Gastro-esophageal reflux disease without esophagitis: Secondary | ICD-10-CM

## 2016-03-19 DIAGNOSIS — Z0000 Encounter for general adult medical examination without abnormal findings: Secondary | ICD-10-CM | POA: Diagnosis not present

## 2016-03-19 DIAGNOSIS — M19041 Primary osteoarthritis, right hand: Secondary | ICD-10-CM

## 2016-03-19 DIAGNOSIS — F329 Major depressive disorder, single episode, unspecified: Secondary | ICD-10-CM

## 2016-03-19 DIAGNOSIS — M810 Age-related osteoporosis without current pathological fracture: Secondary | ICD-10-CM

## 2016-03-19 DIAGNOSIS — J410 Simple chronic bronchitis: Secondary | ICD-10-CM

## 2016-03-19 LAB — URINALYSIS, ROUTINE W REFLEX MICROSCOPIC
Bilirubin, UA: NEGATIVE
GLUCOSE, UA: NEGATIVE
KETONES UA: NEGATIVE
LEUKOCYTES UA: NEGATIVE
Nitrite, UA: NEGATIVE
PROTEIN UA: NEGATIVE
RBC UA: NEGATIVE
Urobilinogen, Ur: 0.2 mg/dL (ref 0.2–1.0)
pH, UA: 5 (ref 5.0–7.5)

## 2016-03-19 MED ORDER — TRAMADOL HCL 50 MG PO TABS: ORAL_TABLET | ORAL | 0 refills | Status: DC

## 2016-03-19 MED ORDER — ESCITALOPRAM OXALATE 20 MG PO TABS: 20.0000 mg | ORAL_TABLET | Freq: Every day | ORAL | 4 refills | Status: DC

## 2016-03-19 MED ORDER — PANTOPRAZOLE SODIUM 40 MG PO TBEC: 40.0000 mg | DELAYED_RELEASE_TABLET | Freq: Every day | ORAL | 4 refills | Status: DC

## 2016-03-19 MED ORDER — LOSARTAN POTASSIUM 100 MG PO TABS: 100.0000 mg | ORAL_TABLET | Freq: Every day | ORAL | 4 refills | Status: DC

## 2016-03-19 MED ORDER — ROSUVASTATIN CALCIUM 20 MG PO TABS: 20.0000 mg | ORAL_TABLET | Freq: Every day | ORAL | 4 refills | Status: DC

## 2016-03-19 NOTE — Assessment & Plan Note (Signed)
For osteoporosis patient's been on Evista long-term doesn't remember how long but many years with expensive medications will go ahead and stop.

## 2016-03-19 NOTE — Assessment & Plan Note (Signed)
The current medical regimen is effective;  continue present plan and medications.  

## 2016-03-19 NOTE — Assessment & Plan Note (Signed)
For blood pressure being elevated patient will go back to losartan 100 mg once a day we'll need to recheck renal function and blood pressure response in one or 2 months.

## 2016-03-19 NOTE — Patient Instructions (Signed)

## 2016-03-19 NOTE — Assessment & Plan Note (Signed)
Discussed osteoarthritis care and treatment limitations of tramadol with current political environment. Will give another prescription for tramadol with cautions. Patient will use Advil and Aleve Tylenol from time to time also. May use hot compresses soaks etc.

## 2016-03-19 NOTE — Progress Notes (Signed)
BP (!) 147/88 (BP Location: Left Arm, Patient Position: Sitting, Cuff Size: Normal)   Pulse (!) 106   Temp 98.3 F (36.8 C)   Ht 5' 2.7" (1.593 m)   Wt 140 lb 3.2 oz (63.6 kg)   SpO2 92%   BMI 25.07 kg/m    Subjective:    Patient ID: Joyce Rodriguez, female    DOB: 1945/12/03, 71 y.o.   MRN: [id number]  HPI: Joyce Rodriguez is a 70 y.o. female  Chief Complaint  Patient presents with  . Medicare Wellness    pt would like mammogram with [facility]  Patient all in all doing well has noticed blood pressure being elevated has been taking 50 mg of losartan instead of 100 mg. Blood pressure had been doing well but recently has noticed to be up. Patient otherwise feeling well. Reflux doing well. Cholesterol no issues No problems with Effexor. Has taken tramadol occasionally for arthritis in her hands of the 100 tablets given in July still has several left. Reviewed cautions about tramadol  Relevant past medical, surgical, family and social history reviewed and updated as indicated. Interim medical history since our last visit reviewed. Allergies and medications reviewed and updated.  Review of Systems  Constitutional: Negative.   HENT: Negative.   Eyes: Negative.   Respiratory: Negative.   Cardiovascular: Negative.   Gastrointestinal: Negative.   Endocrine: Negative.   Genitourinary: Negative.   Musculoskeletal: Negative.   Skin: Negative.   Allergic/Immunologic: Negative.   Neurological: Negative.   Hematological: Negative.   Psychiatric/Behavioral: Negative.     Per HPI unless specifically indicated above     Objective:    BP (!) 147/88 (BP Location: Left Arm, Patient Position: Sitting, Cuff Size: Normal)   Pulse (!) 106   Temp 98.3 F (36.8 C)   Ht 5' 2.7" (1.593 m)   Wt 140 lb 3.2 oz (63.6 kg)   SpO2 92%   BMI 25.07 kg/m   Wt Readings from Last 3 Encounters:  03/19/16 140 lb 3.2 oz (63.6 kg)  01/13/16 140 lb 12.8 oz (63.9 kg)  11/07/15 139 lb (63 kg)     Physical Exam  Constitutional: She is oriented to person, place, and time. She appears well-developed and well-nourished.  HENT:  Head: Normocephalic and atraumatic.  Right Ear: External ear normal.  Left Ear: External ear normal.  Nose: Nose normal.  Mouth/Throat: Oropharynx is clear and moist.  Eyes: Conjunctivae and EOM are normal. Pupils are equal, round, and reactive to light.  Neck: Normal range of motion. Neck supple. Carotid bruit is not present.  Cardiovascular: Normal rate, regular rhythm and normal heart sounds.   No murmur heard. Pulmonary/Chest: Effort normal and breath sounds normal. She exhibits no mass. Right breast exhibits no mass, no skin change and no tenderness. Left breast exhibits no mass, no skin change and no tenderness. Breasts are symmetrical.  Abdominal: Soft. Bowel sounds are normal. There is no hepatosplenomegaly.  Musculoskeletal: Normal range of motion.  Marked osteoarthritis changes of hands  Neurological: She is alert and oriented to person, place, and time.  Skin: No rash noted.  Psychiatric: She has a normal mood and affect. Her behavior is normal. Judgment and thought content normal.    Results for orders placed or performed in visit on 11/07/15  LP+ALT+AST Piccolo, [employer]  Result Value Ref Range   ALT (SGPT) Piccolo, Waived 18 10 - 47 U/L   AST (SGOT) Piccolo, Waived 33 11 - 38 U/L   Cholesterol Piccolo,  Waived 165 <200 mg/dL   HDL Chol Piccolo, Waived 80 >59 mg/dL   Triglycerides Piccolo,Waived 94 <150 mg/dL   Chol/HDL Ratio Piccolo,Waive 2.1 mg/dL   LDL Chol Calc Piccolo Waived 66 <100 mg/dL   VLDL Chol Calc Piccolo,Waive 19 <30 mg/dL  Basic metabolic panel  Result Value Ref Range   Glucose 80 65 - 99 mg/dL   BUN 10 8 - 27 mg/dL   Creatinine, Ser 0.80 0.57 - 1.00 mg/dL   GFR calc non Af Amer 76 >59 mL/min/1.73   GFR calc Af Amer 88 >59 mL/min/1.73   BUN/Creatinine Ratio 13 12 - 28   Sodium 140 134 - 144 mmol/L   Potassium 4.4 3.5 -  5.2 mmol/L   Chloride 97 96 - 106 mmol/L   CO2 27 18 - 29 mmol/L   Calcium 10.1 8.7 - 10.3 mg/dL      Assessment & Plan:   Problem List Items Addressed This Visit      Cardiovascular and Mediastinum   Essential hypertension    For blood pressure being elevated patient will go back to losartan 100 mg once a day we'll need to recheck renal function and blood pressure response in one or 2 months.      Relevant Medications   rosuvastatin (CRESTOR) 20 MG tablet   losartan (COZAAR) 100 MG tablet   Other Relevant Orders   Comprehensive metabolic panel   CBC with Differential/Platelet   TSH   Urinalysis, Routine w reflex microscopic     Respiratory   COPD (chronic obstructive pulmonary disease) ([place])    COPD doing stable patient remains off cigarettes. Pulmonary rehabilitation was most effective for helping her breathe has not been doing her exercises lately but will restart. Inhalers doing okay but expensive especially now in the doughnut [place]      Relevant Orders   CBC with Differential/Platelet     Digestive   Acid reflux    The current medical regimen is effective;  continue present plan and medications.       Relevant Medications   pantoprazole (PROTONIX) 40 MG tablet     Musculoskeletal and Integument   Osteoarthritis of both hands    Discussed osteoarthritis care and treatment limitations of tramadol with current political environment. Will give another prescription for tramadol with cautions. Patient will use Advil and Aleve Tylenol from time to time also. May use hot compresses soaks etc.      Relevant Medications   traMADol (ULTRAM) 50 MG tablet   Other Relevant Orders   CBC with Differential/Platelet   Osteoporosis    For osteoporosis patient's been on Evista long-term doesn't remember how long but many years with expensive medications will go ahead and stop.      Relevant Orders   Comprehensive metabolic panel   CBC with Differential/Platelet     Other    Major depression, chronic (Chronic)    The current medical regimen is effective;  continue present plan and medications.       Relevant Medications   escitalopram (LEXAPRO) 20 MG tablet   Other Relevant Orders   TSH   Hyperlipidemia    The current medical regimen is effective;  continue present plan and medications.       Relevant Medications   rosuvastatin (CRESTOR) 20 MG tablet   losartan (COZAAR) 100 MG tablet   Other Relevant Orders   Comprehensive metabolic panel   Lipid panel   CBC with Differential/Platelet   Encounter for hepatitis C screening test  for low risk patient   Relevant Orders   Hepatitis C antibody    Other Visit Diagnoses    Need for influenza vaccination    -  Primary   Relevant Orders   Flu vaccine HIGH DOSE PF (Completed)   PE (physical exam), annual           Follow up plan: Return in about 2 months (around 05/20/2016) for BMP.

## 2016-03-19 NOTE — Assessment & Plan Note (Signed)
COPD doing stable patient remains off cigarettes. Pulmonary rehabilitation was most effective for helping her breathe has not been doing her exercises lately but will restart. Inhalers doing okay but expensive especially now in the doughnut hole

## 2016-03-20 LAB — COMPREHENSIVE METABOLIC PANEL
ALBUMIN: 4.1 g/dL (ref 3.6–4.8)
ALK PHOS: 53 IU/L (ref 39–117)
ALT: 12 IU/L (ref 0–32)
AST: 19 IU/L (ref 0–40)
Albumin/Globulin Ratio: 1.6 (ref 1.2–2.2)
BUN / CREAT RATIO: 14 (ref 12–28)
BUN: 10 mg/dL (ref 8–27)
Bilirubin Total: 0.2 mg/dL (ref 0.0–1.2)
CO2: 27 mmol/L (ref 18–29)
CREATININE: 0.69 mg/dL (ref 0.57–1.00)
Calcium: 9.7 mg/dL (ref 8.7–10.3)
Chloride: 100 mmol/L (ref 96–106)
GFR calc Af Amer: 103 mL/min/{1.73_m2} (ref >59)
GFR calc non Af Amer: 89 mL/min/{1.73_m2} (ref >59)
GLUCOSE: 85 mg/dL (ref 65–99)
Globulin, Total: 2.5 g/dL (ref 1.5–4.5)
Potassium: 4.5 mmol/L (ref 3.5–5.2)
Sodium: 141 mmol/L (ref 134–144)
TOTAL PROTEIN: 6.6 g/dL (ref 6.0–8.5)

## 2016-03-20 LAB — CBC WITH DIFFERENTIAL/PLATELET
BASOS: 1 %
Basophils Absolute: 0.1 10*3/uL (ref 0.0–0.2)
EOS (ABSOLUTE): 0.3 10*3/uL (ref 0.0–0.4)
Eos: 3 %
HEMATOCRIT: 40 % (ref 34.0–46.6)
Hemoglobin: 13.4 g/dL (ref 11.1–15.9)
Immature Grans (Abs): 0 10*3/uL (ref 0.0–0.1)
Immature Granulocytes: 0 %
Lymphocytes Absolute: 1.4 10*3/uL (ref 0.7–3.1)
Lymphs: 17 %
MCH: 31.5 pg (ref 26.6–33.0)
MCHC: 33.5 g/dL (ref 31.5–35.7)
MCV: 94 fL (ref 79–97)
MONOS ABS: 0.7 10*3/uL (ref 0.1–0.9)
Monocytes: 9 %
NEUTROS ABS: 5.7 10*3/uL (ref 1.4–7.0)
NEUTROS PCT: 70 %
PLATELETS: 311 10*3/uL (ref 150–379)
RBC: 4.26 x10E6/uL (ref 3.77–5.28)
RDW: 13.4 % (ref 12.3–15.4)
WBC: 8.2 10*3/uL (ref 3.4–10.8)

## 2016-03-20 LAB — LIPID PANEL
CHOLESTEROL TOTAL: 173 mg/dL (ref 100–199)
Chol/HDL Ratio: 2.1 ratio units (ref 0.0–4.4)
HDL: 83 mg/dL (ref >39)
LDL CALC: 72 mg/dL (ref 0–99)
Triglycerides: 89 mg/dL (ref 0–149)
VLDL CHOLESTEROL CAL: 18 mg/dL (ref 5–40)

## 2016-03-20 LAB — HEPATITIS C ANTIBODY: Hep C Virus Ab: <0.1 s/co ratio (ref 0.0–0.9)

## 2016-03-20 LAB — TSH: TSH: 1.85 u[IU]/mL (ref 0.450–4.500)

## 2016-03-23 ENCOUNTER — Encounter: Payer: Self-pay | Admitting: Family Medicine

## 2016-03-24 ENCOUNTER — Encounter: Payer: PPO | Admitting: Family Medicine

## 2016-03-30 DIAGNOSIS — R0602 Shortness of breath: Secondary | ICD-10-CM | POA: Diagnosis not present

## 2016-03-30 DIAGNOSIS — J9611 Chronic respiratory failure with hypoxia: Secondary | ICD-10-CM | POA: Diagnosis not present

## 2016-03-30 DIAGNOSIS — J449 Chronic obstructive pulmonary disease, unspecified: Secondary | ICD-10-CM | POA: Diagnosis not present

## 2016-03-30 DIAGNOSIS — J440 Chronic obstructive pulmonary disease with acute lower respiratory infection: Secondary | ICD-10-CM | POA: Diagnosis not present

## 2016-04-01 DIAGNOSIS — J441 Chronic obstructive pulmonary disease with (acute) exacerbation: Secondary | ICD-10-CM | POA: Diagnosis not present

## 2016-04-22 ENCOUNTER — Other Ambulatory Visit: Payer: Self-pay | Admitting: Internal Medicine

## 2016-04-22 ENCOUNTER — Ambulatory Visit
Admission: RE | Admit: 2016-04-22 | Discharge: 2016-04-22 | Disposition: A | Discharge status: Home / Self Care | Payer: PPO | Source: Ambulatory Visit | Attending: Internal Medicine | Admitting: Internal Medicine

## 2016-04-22 DIAGNOSIS — J449 Chronic obstructive pulmonary disease, unspecified: Secondary | ICD-10-CM | POA: Insufficient documentation

## 2016-04-22 DIAGNOSIS — J440 Chronic obstructive pulmonary disease with acute lower respiratory infection: Secondary | ICD-10-CM | POA: Diagnosis not present

## 2016-04-22 DIAGNOSIS — R0602 Shortness of breath: Secondary | ICD-10-CM | POA: Diagnosis not present

## 2016-04-22 DIAGNOSIS — R059 Cough, unspecified: Secondary | ICD-10-CM

## 2016-04-22 DIAGNOSIS — I700 Atherosclerosis of aorta: Secondary | ICD-10-CM | POA: Insufficient documentation

## 2016-04-22 DIAGNOSIS — R05 Cough: Secondary | ICD-10-CM | POA: Diagnosis not present

## 2016-04-22 DIAGNOSIS — Z87891 Personal history of nicotine dependence: Secondary | ICD-10-CM | POA: Insufficient documentation

## 2016-04-22 DIAGNOSIS — J9611 Chronic respiratory failure with hypoxia: Secondary | ICD-10-CM | POA: Diagnosis not present

## 2016-05-02 DIAGNOSIS — J441 Chronic obstructive pulmonary disease with (acute) exacerbation: Secondary | ICD-10-CM | POA: Diagnosis not present

## 2016-05-06 ENCOUNTER — Observation Stay
Admission: AD | Admit: 2016-05-06 | Discharge: 2016-05-08 | Disposition: A | Discharge status: Home / Self Care | Payer: PPO | Source: Ambulatory Visit | Attending: Internal Medicine | Admitting: Internal Medicine

## 2016-05-06 ENCOUNTER — Encounter: Payer: Self-pay | Admitting: *Deleted

## 2016-05-06 ENCOUNTER — Observation Stay: Payer: PPO

## 2016-05-06 DIAGNOSIS — J9610 Chronic respiratory failure, unspecified whether with hypoxia or hypercapnia: Secondary | ICD-10-CM | POA: Diagnosis not present

## 2016-05-06 DIAGNOSIS — J441 Chronic obstructive pulmonary disease with (acute) exacerbation: Secondary | ICD-10-CM | POA: Insufficient documentation

## 2016-05-06 DIAGNOSIS — J9621 Acute and chronic respiratory failure with hypoxia: Principal | ICD-10-CM | POA: Insufficient documentation

## 2016-05-06 DIAGNOSIS — I10 Essential (primary) hypertension: Secondary | ICD-10-CM | POA: Insufficient documentation

## 2016-05-06 DIAGNOSIS — Z7951 Long term (current) use of inhaled steroids: Secondary | ICD-10-CM | POA: Insufficient documentation

## 2016-05-06 DIAGNOSIS — Z79899 Other long term (current) drug therapy: Secondary | ICD-10-CM | POA: Diagnosis not present

## 2016-05-06 DIAGNOSIS — T380X5A Adverse effect of glucocorticoids and synthetic analogues, initial encounter: Secondary | ICD-10-CM | POA: Insufficient documentation

## 2016-05-06 DIAGNOSIS — Z7982 Long term (current) use of aspirin: Secondary | ICD-10-CM | POA: Insufficient documentation

## 2016-05-06 DIAGNOSIS — R000 Tachycardia, unspecified: Secondary | ICD-10-CM | POA: Diagnosis not present

## 2016-05-06 DIAGNOSIS — R739 Hyperglycemia, unspecified: Secondary | ICD-10-CM | POA: Insufficient documentation

## 2016-05-06 DIAGNOSIS — R0600 Dyspnea, unspecified: Secondary | ICD-10-CM

## 2016-05-06 DIAGNOSIS — J9612 Chronic respiratory failure with hypercapnia: Secondary | ICD-10-CM

## 2016-05-06 DIAGNOSIS — J440 Chronic obstructive pulmonary disease with acute lower respiratory infection: Secondary | ICD-10-CM | POA: Diagnosis not present

## 2016-05-06 DIAGNOSIS — E785 Hyperlipidemia, unspecified: Secondary | ICD-10-CM | POA: Diagnosis not present

## 2016-05-06 DIAGNOSIS — R05 Cough: Secondary | ICD-10-CM | POA: Diagnosis not present

## 2016-05-06 DIAGNOSIS — J189 Pneumonia, unspecified organism: Secondary | ICD-10-CM

## 2016-05-06 DIAGNOSIS — Z87891 Personal history of nicotine dependence: Secondary | ICD-10-CM | POA: Diagnosis not present

## 2016-05-06 DIAGNOSIS — Z9981 Dependence on supplemental oxygen: Secondary | ICD-10-CM | POA: Diagnosis not present

## 2016-05-06 DIAGNOSIS — J9611 Chronic respiratory failure with hypoxia: Secondary | ICD-10-CM | POA: Diagnosis not present

## 2016-05-06 DIAGNOSIS — J410 Simple chronic bronchitis: Secondary | ICD-10-CM

## 2016-05-06 DIAGNOSIS — R0602 Shortness of breath: Secondary | ICD-10-CM | POA: Diagnosis not present

## 2016-05-06 LAB — BASIC METABOLIC PANEL
Anion gap: 6 (ref 5–15)
BUN: 13 mg/dL (ref 6–20)
CHLORIDE: 105 mmol/L (ref 101–111)
CO2: 31 mmol/L (ref 22–32)
Calcium: 8.8 mg/dL — ABNORMAL LOW (ref 8.9–10.3)
Creatinine, Ser: 0.74 mg/dL (ref 0.44–1.00)
GFR calc non Af Amer: >60 mL/min (ref >60)
Glucose, Bld: 112 mg/dL — ABNORMAL HIGH (ref 65–99)
POTASSIUM: 3.5 mmol/L (ref 3.5–5.1)
SODIUM: 142 mmol/L (ref 135–145)

## 2016-05-06 LAB — CBC
HEMATOCRIT: 37.6 % (ref 35.0–47.0)
HEMOGLOBIN: 12.8 g/dL (ref 12.0–16.0)
MCH: 31.4 pg (ref 26.0–34.0)
MCHC: 34 g/dL (ref 32.0–36.0)
MCV: 92.6 fL (ref 80.0–100.0)
Platelets: 277 10*3/uL (ref 150–440)
RBC: 4.06 MIL/uL (ref 3.80–5.20)
RDW: 13.5 % (ref 11.5–14.5)
WBC: 6.6 10*3/uL (ref 3.6–11.0)

## 2016-05-06 LAB — MAGNESIUM: MAGNESIUM: 1.7 mg/dL (ref 1.7–2.4)

## 2016-05-06 MED ORDER — METHYLPREDNISOLONE SODIUM SUCC 125 MG IJ SOLR
60.0000 mg | Freq: Four times a day (QID) | INTRAMUSCULAR | Status: DC
  Administered 2016-05-06 – 2016-05-08 (×8): 60 mg via INTRAVENOUS
  Filled 2016-05-06 (×8): qty 2

## 2016-05-06 MED ORDER — ROSUVASTATIN CALCIUM 20 MG PO TABS
20.0000 mg | ORAL_TABLET | Freq: Every day | ORAL | Status: DC
  Administered 2016-05-07 – 2016-05-08 (×2): 20 mg via ORAL
  Filled 2016-05-06 (×2): qty 1

## 2016-05-06 MED ORDER — BENZONATATE 100 MG PO CAPS
100.0000 mg | ORAL_CAPSULE | Freq: Three times a day (TID) | ORAL | Status: DC | PRN
  Administered 2016-05-07 (×2): 100 mg via ORAL
  Filled 2016-05-06 (×2): qty 1

## 2016-05-06 MED ORDER — HEPARIN SODIUM (PORCINE) 5000 UNIT/ML IJ SOLN
5000.0000 [IU] | Freq: Three times a day (TID) | INTRAMUSCULAR | Status: DC
  Administered 2016-05-06 – 2016-05-07 (×3): 5000 [IU] via SUBCUTANEOUS
  Filled 2016-05-06 (×3): qty 1

## 2016-05-06 MED ORDER — TIOTROPIUM BROMIDE MONOHYDRATE 18 MCG IN CAPS
18.0000 ug | ORAL_CAPSULE | Freq: Every day | RESPIRATORY_TRACT | Status: DC
  Administered 2016-05-06 – 2016-05-08 (×2): 18 ug via RESPIRATORY_TRACT
  Filled 2016-05-06: qty 5

## 2016-05-06 MED ORDER — SODIUM CHLORIDE 0.9% FLUSH
3.0000 mL | Freq: Two times a day (BID) | INTRAVENOUS | Status: DC
  Administered 2016-05-06 – 2016-05-08 (×4): 3 mL via INTRAVENOUS

## 2016-05-06 MED ORDER — ONDANSETRON HCL 4 MG/2ML IJ SOLN: 4.0000 mg | Freq: Four times a day (QID) | INTRAMUSCULAR | Status: DC | PRN

## 2016-05-06 MED ORDER — SODIUM CHLORIDE 0.9 % IV SOLN: 250.0000 mL | INTRAVENOUS | Status: DC | PRN

## 2016-05-06 MED ORDER — ACETAMINOPHEN 325 MG PO TABS
650.0000 mg | ORAL_TABLET | Freq: Four times a day (QID) | ORAL | Status: DC | PRN
  Administered 2016-05-08 (×2): 650 mg via ORAL
  Filled 2016-05-06 (×2): qty 2

## 2016-05-06 MED ORDER — ONDANSETRON HCL 4 MG PO TABS: 4.0000 mg | ORAL_TABLET | Freq: Four times a day (QID) | ORAL | Status: DC | PRN

## 2016-05-06 MED ORDER — LEVOFLOXACIN IN D5W 750 MG/150ML IV SOLN
750.0000 mg | INTRAVENOUS | Status: DC
Start: 2016-05-06 — End: 2016-05-07
  Administered 2016-05-06: 750 mg via INTRAVENOUS
  Filled 2016-05-06 (×2): qty 150

## 2016-05-06 MED ORDER — MOMETASONE FURO-FORMOTEROL FUM 200-5 MCG/ACT IN AERO
2.0000 | INHALATION_SPRAY | Freq: Two times a day (BID) | RESPIRATORY_TRACT | Status: DC
  Administered 2016-05-06 – 2016-05-08 (×3): 2 via RESPIRATORY_TRACT
  Filled 2016-05-06 (×2): qty 8.8

## 2016-05-06 MED ORDER — PANTOPRAZOLE SODIUM 40 MG PO TBEC
40.0000 mg | DELAYED_RELEASE_TABLET | Freq: Every day | ORAL | Status: DC
  Administered 2016-05-07 – 2016-05-08 (×2): 40 mg via ORAL
  Filled 2016-05-06 (×2): qty 1

## 2016-05-06 MED ORDER — GUAIFENESIN 100 MG/5ML PO SOLN
5.0000 mL | ORAL | Status: DC | PRN
  Administered 2016-05-07: 100 mg via ORAL
  Filled 2016-05-06 (×2): qty 5

## 2016-05-06 MED ORDER — BISACODYL 5 MG PO TBEC
5.0000 mg | DELAYED_RELEASE_TABLET | Freq: Every day | ORAL | Status: DC | PRN
Start: 2016-05-06 — End: 2016-05-08

## 2016-05-06 MED ORDER — SODIUM CHLORIDE 0.9% FLUSH: 3.0000 mL | INTRAVENOUS | Status: DC | PRN

## 2016-05-06 MED ORDER — TRAMADOL HCL 50 MG PO TABS: 50.0000 mg | ORAL_TABLET | Freq: Four times a day (QID) | ORAL | Status: DC | PRN

## 2016-05-06 MED ORDER — ACETAMINOPHEN 650 MG RE SUPP: 650.0000 mg | Freq: Four times a day (QID) | RECTAL | Status: DC | PRN

## 2016-05-06 MED ORDER — ESCITALOPRAM OXALATE 10 MG PO TABS
20.0000 mg | ORAL_TABLET | Freq: Every day | ORAL | Status: DC
  Administered 2016-05-07 – 2016-05-08 (×2): 20 mg via ORAL
  Filled 2016-05-06 (×3): qty 2

## 2016-05-06 MED ORDER — ASPIRIN EC 81 MG PO TBEC
81.0000 mg | DELAYED_RELEASE_TABLET | Freq: Every day | ORAL | Status: DC
  Administered 2016-05-07 – 2016-05-08 (×2): 81 mg via ORAL
  Filled 2016-05-06 (×2): qty 1

## 2016-05-06 MED ORDER — ALBUTEROL SULFATE (2.5 MG/3ML) 0.083% IN NEBU: 2.5000 mg | INHALATION_SOLUTION | RESPIRATORY_TRACT | Status: DC | PRN

## 2016-05-06 MED ORDER — LOSARTAN POTASSIUM 50 MG PO TABS
100.0000 mg | ORAL_TABLET | Freq: Every day | ORAL | Status: DC
  Administered 2016-05-07 – 2016-05-08 (×2): 100 mg via ORAL
  Filled 2016-05-06 (×2): qty 2

## 2016-05-06 MED ORDER — SENNOSIDES-DOCUSATE SODIUM 8.6-50 MG PO TABS: 1.0000 | ORAL_TABLET | Freq: Every evening | ORAL | Status: DC | PRN

## 2016-05-06 NOTE — H&P (Signed)
[place] at [place] NAME: Joyce Rodriguez    MR#:  [id number]  DATE OF BIRTH:  December 14, 1945  DATE OF ADMISSION:  05/06/2016  PRIMARY CARE PHYSICIAN: Golden Pop, MD   REQUESTING/REFERRING PHYSICIAN: Devona Konig, MD.  CHIEF COMPLAINT:  No chief complaint on file.  Cough and shortness of breath for several weeks, worsening for a few days. HISTORY OF PRESENT ILLNESS:  Joyce Rodriguez  is a 71 y.o. female with a known history of COPD, chronic respiratory failure on home oxygen 2 L, hypertension and hyperlipidemia. The patient is sent to the hospital for direct admission by Dr. Humphrey Rolls for COPD exacerbation. The patient complains of cough and shortness of breath for a few weeks. She has been treated with nebulizer and 2 rounds of antibiotics as outpatient without improvement. She also complains of worsening cough and wheezing. But she denies any fever or chills, no orthopnea, nocturnal dyspnea or leg edema. PAST MEDICAL HISTORY:   Past Medical History:  Diagnosis Date  . COPD (chronic obstructive pulmonary disease) ([place])   . Hyperlipidemia   . Osteoporosis     PAST SURGICAL HISTORY:   Past Surgical History:  Procedure Laterality Date  . ABDOMINAL HYSTERECTOMY    . CHOLECYSTECTOMY      SOCIAL HISTORY:   Social History  Substance Use Topics  . Smoking status: Former Smoker    Types: Cigarettes    Quit date: 03/18/2001  . Smokeless tobacco: Never Used  . Alcohol use Yes     Comment: rare    FAMILY HISTORY:   Family History  Problem Relation Age of Onset  . Cancer Mother     ovarian  . Emphysema Father   . Cancer Sister     melanoma  . Cancer Brother     lung    DRUG ALLERGIES:   Allergies  Allergen Reactions  . Codeine     REVIEW OF SYSTEMS:   Review of Systems  Constitutional: Positive for malaise/fatigue. Negative for chills and fever.  HENT: Negative for congestion and sore throat.   Eyes: Negative for blurred  vision and double vision.  Respiratory: Positive for cough, sputum production, shortness of breath and wheezing. Negative for hemoptysis.   Cardiovascular: Negative for chest pain and leg swelling.  Gastrointestinal: Negative for abdominal pain, constipation, nausea and vomiting.  Genitourinary: Negative for dysuria and hematuria.  Musculoskeletal: Negative for joint pain.  Skin: Negative for itching and rash.  Neurological: Negative for dizziness, focal weakness, loss of consciousness and weakness.  Psychiatric/Behavioral: Negative for depression. The patient is not nervous/anxious.     MEDICATIONS AT HOME:   Prior to Admission medications   Medication Sig Start Date End Date Taking? Authorizing Provider  escitalopram (LEXAPRO) 20 MG tablet Take 1 tablet (20 mg total) by mouth daily. 03/19/16  Yes Guadalupe Maple, MD  losartan (COZAAR) 100 MG tablet Take 1 tablet (100 mg total) by mouth daily. 03/19/16  Yes Guadalupe Maple, MD  pantoprazole (PROTONIX) 40 MG tablet Take 1 tablet (40 mg total) by mouth daily. 03/19/16  Yes Guadalupe Maple, MD  rosuvastatin (CRESTOR) 20 MG tablet Take 1 tablet (20 mg total) by mouth daily. 03/19/16  Yes Guadalupe Maple, MD  traMADol (ULTRAM) 50 MG tablet 1-2 tabs q6 hours as needed for pain 03/19/16  Yes Guadalupe Maple, MD  albuterol (PROVENTIL HFA;VENTOLIN HFA) 108 (90 BASE) MCG/ACT inhaler Inhale 2 puffs into the lungs every 6 (six) hours as  needed for shortness of breath.    Historical Provider, MD  aspirin EC 81 MG tablet Take 81 mg by mouth daily.    Historical Provider, MD  Fluticasone-Salmeterol (ADVAIR) 250-50 MCG/DOSE AEPB Inhale 1 puff into the lungs 2 (two) times daily.    Historical Provider, MD  tiotropium (SPIRIVA) 18 MCG inhalation capsule Place 18 mcg into inhaler and inhale daily.    Historical Provider, MD      VITAL SIGNS:  Blood pressure 127/78, pulse 91, temperature 98 F (36.7 C), temperature source Oral, resp. rate 20, height 5\' 3"  (1.6  m), weight 140 lb 14.4 oz (63.9 kg), SpO2 97 %.  PHYSICAL EXAMINATION:  Physical Exam  GENERAL:  71 y.o.-year-old patient lying in the bed with no acute distress.  EYES: Pupils equal, round, reactive to light and accommodation. No scleral icterus. Extraocular muscles intact.  HEENT: Head atraumatic, normocephalic. Oropharynx and nasopharynx clear.  NECK:  Supple, no jugular venous distention. No thyroid enlargement, no tenderness.  LUNGS: Diminished breath sounds bilaterally, mild expiratory wheezing, no rales,rhonchi or crepitation. No use of accessory muscles of respiration.  CARDIOVASCULAR: S1, S2 normal. No murmurs, rubs, or gallops.  ABDOMEN: Soft, nontender, nondistended. Bowel sounds present. No organomegaly or mass.  EXTREMITIES: No pedal edema, cyanosis, or clubbing.  NEUROLOGIC: Cranial nerves II through XII are intact. Muscle strength 5/5 in all extremities. Sensation intact. Gait not checked.  PSYCHIATRIC: The patient is alert and oriented x 3.  SKIN: No obvious rash, lesion, or ulcer.   LABORATORY PANEL:   CBC No results for input(s): WBC, HGB, HCT, PLT in the last 168 hours. ------------------------------------------------------------------------------------------------------------------  Chemistries  No results for input(s): NA, K, CL, CO2, GLUCOSE, BUN, CREATININE, CALCIUM, MG, AST, ALT, ALKPHOS, BILITOT in the last 168 hours.  Invalid input(s): GFRCGP ------------------------------------------------------------------------------------------------------------------  Cardiac Enzymes No results for input(s): TROPONINI in the last 168 hours. ------------------------------------------------------------------------------------------------------------------  RADIOLOGY:  No results found.    IMPRESSION AND PLAN:   COPD exacerbation with chronic respiratory failure on home oxygen. The patient will be placed for observation. Per pulmonary Dr. Humphrey Rolls, start IV steroid and  IV Levaquin, give nebulizer treatment. Robitussin when necessary.  Hypertension. Continue home hypertension medication.  I discussed with Dr. Humphrey Rolls. All the records are reviewed and case discussed with ED provider. Management plans discussed with the patient, family and they are in agreement.  CODE STATUS: Full code  TOTAL TIME TAKING CARE OF THIS PATIENT: 52 minutes.    Demetrios Loll M.D on 05/06/2016 at 5:33 PM  Between 7am to 6pm - Pager - [phone]  After 6pm go to [email or web address] - Proofreader  [facility]  Office  [phone]  CC: Primary care physician; Golden Pop, MD   Note: This dictation was prepared with Dragon dictation along with smaller phrase technology. Any transcriptional errors that result from this process are unintentional.

## 2016-05-07 DIAGNOSIS — I10 Essential (primary) hypertension: Secondary | ICD-10-CM | POA: Diagnosis not present

## 2016-05-07 DIAGNOSIS — J9621 Acute and chronic respiratory failure with hypoxia: Secondary | ICD-10-CM | POA: Diagnosis not present

## 2016-05-07 DIAGNOSIS — J441 Chronic obstructive pulmonary disease with (acute) exacerbation: Secondary | ICD-10-CM | POA: Diagnosis not present

## 2016-05-07 DIAGNOSIS — J209 Acute bronchitis, unspecified: Secondary | ICD-10-CM | POA: Diagnosis not present

## 2016-05-07 MED ORDER — GUAIFENESIN ER 600 MG PO TB12
600.0000 mg | ORAL_TABLET | Freq: Two times a day (BID) | ORAL | Status: DC
  Administered 2016-05-07 – 2016-05-08 (×3): 600 mg via ORAL
  Filled 2016-05-07 (×3): qty 1

## 2016-05-07 MED ORDER — LEVOFLOXACIN 500 MG PO TABS
750.0000 mg | ORAL_TABLET | Freq: Every day | ORAL | Status: DC
  Administered 2016-05-07: 750 mg via ORAL
  Filled 2016-05-07: qty 2

## 2016-05-07 MED ORDER — ENOXAPARIN SODIUM 40 MG/0.4ML ~~LOC~~ SOLN
40.0000 mg | SUBCUTANEOUS | Status: DC
  Administered 2016-05-07: 40 mg via SUBCUTANEOUS
  Filled 2016-05-07: qty 0.4

## 2016-05-07 MED ORDER — ALBUTEROL SULFATE (2.5 MG/3ML) 0.083% IN NEBU
2.5000 mg | INHALATION_SOLUTION | RESPIRATORY_TRACT | Status: DC
  Administered 2016-05-07 – 2016-05-08 (×6): 2.5 mg via RESPIRATORY_TRACT
  Filled 2016-05-07 (×6): qty 3

## 2016-05-07 NOTE — Progress Notes (Signed)
Arrival Method: via wheelchair with volunteer, direct admit Mental Orientation: A&O Telemetry: MX40-01, verified with Elita Quick, NT Skin: intact, no breakdown, verified by Georg Ruddle, RN IV: this RN placed 20g right forearm Pain: none Tubes: O2 2L chronic Safety Measures: Safety Fall Prevention Plan has been given, discussed, non skid socks in place,  2A Orientation: Patient has been orientated to the room, unit & staff.  Family: Has been informed of plan of care.  Orders have been reviewed & implemented. Will continue to monitor the patient. Call light has been placed within reach.  Georgian Co, RN

## 2016-05-07 NOTE — Care Management Obs Status (Signed)
[place] NOTIFICATION   Patient Details  Name: Joyce Rodriguez MRN: [id number] Date of Birth: 08-Sep-1945   Medicare Observation Status Notification Given:  Yes    Katrina Stack, RN 05/07/2016, 1:45 PM

## 2016-05-07 NOTE — Plan of Care (Signed)
Problem: Pain Managment: Goal: General experience of comfort will improve Outcome: Progressing No complaints of pain this shift.  Problem: Physical Regulation: Goal: Ability to maintain clinical measurements within normal limits will improve Outcome: Progressing IV abx, IV steroids & inhalers started. Pt is having some dyspenia on exertion, but catches her breath shortly after, pt is still on the 2L O2 which she wears chronically.

## 2016-05-07 NOTE — Care Management (Signed)
Patient placed in observation from home for exacerbation of copd.  She was a direct admit from Dr Laurelyn Sickle office.  Independent in all adls, denies issues accessing medical care, obtaining medications or with transportation.  Current with her PCP.  She has chronic home continuous 02 through [facility].  She is being evaluated for a portable concentrator.

## 2016-05-07 NOTE — Progress Notes (Signed)
[place] at [place] NAME: Joyce Rodriguez    MR#:  [id number]  DATE OF BIRTH:  1945/10/02  SUBJECTIVE:  CHIEF COMPLAINT:  No chief complaint on file.  The patient is 71 year old Caucasian female with past medical history significant for history of COPD, end-stage, on chronic oxygen therapy at 2 L of oxygen via nasal cannula, essential hypertension, hyperlipidemia, who presents to the hospital due to COPD exacerbation. Patient admitted of cough, shortness of breath going on for the past 2 weeks, worsening and not improving with 2 rounds of antibiotic therapy. She admitted of a wheezing but denied fevers or chills. Chest x-ray on admission to emergency room revealed bibasilar opacities, likely chronic changes per radiologist. Patient feels somewhat better today, denies any sputum production Review of Systems  Constitutional: Negative for chills, fever and weight loss.  HENT: Negative for congestion.   Eyes: Negative for blurred vision and double vision.  Respiratory: Positive for cough, shortness of breath and wheezing. Negative for sputum production.   Cardiovascular: Positive for chest pain. Negative for palpitations, orthopnea, leg swelling and PND.  Gastrointestinal: Negative for abdominal pain, blood in stool, constipation, diarrhea, nausea and vomiting.  Genitourinary: Negative for dysuria, frequency, hematuria and urgency.  Musculoskeletal: Negative for falls.  Neurological: Negative for dizziness, tremors, focal weakness and headaches.  Endo/Heme/Allergies: Does not bruise/bleed easily.  Psychiatric/Behavioral: Negative for depression. The patient does not have insomnia.     VITAL SIGNS: Blood pressure (!) 168/97, pulse (!) 106, temperature 97.9 F (36.6 C), temperature source Oral, resp. rate 18, height 5\' 3"  (1.6 m), weight 63.9 kg (140 lb 14.4 oz), SpO2 95 %.  PHYSICAL EXAMINATION:   GENERAL:  71 y.o.-year-old patient lying in  the bed in mild-to-moderate respiratory distress, tachypneic, uncomfortable, pursed lip breathing.  EYES: Pupils equal, round, reactive to light and accommodation. No scleral icterus. Extraocular muscles intact.  HEENT: Head atraumatic, normocephalic. Oropharynx and nasopharynx clear.  NECK:  Supple, no jugular venous distention. No thyroid enlargement, no tenderness.  LUNGS: Markedly diminished breath sounds bilaterally posteriorly, no wheezing, rales,rhonchi or crepitation. Using accessory muscles of respiration, pursed lip breathing, uncomfortable, tachypneic.  CARDIOVASCULAR: S1, S2 distant, rhythm was regular. No murmurs, rubs, or gallops.  ABDOMEN: Soft, nontender, nondistended. Bowel sounds present. No organomegaly or mass.  EXTREMITIES: No pedal edema, cyanosis, or clubbing.  NEUROLOGIC: Cranial nerves II through XII are intact. Muscle strength 5/5 in all extremities. Sensation intact. Gait not checked.  PSYCHIATRIC: The patient is alert and oriented x 3.  SKIN: No obvious rash, lesion, or ulcer.   ORDERS/RESULTS REVIEWED:   CBC  Recent Labs Lab 05/06/16 2037  WBC 6.6  HGB 12.8  HCT 37.6  PLT 277  MCV 92.6  MCH 31.4  MCHC 34.0  RDW 13.5   ------------------------------------------------------------------------------------------------------------------  Chemistries   Recent Labs Lab 05/06/16 1721 05/06/16 2037  NA  --  142  K  --  3.5  CL  --  105  CO2  --  31  GLUCOSE  --  112*  BUN  --  13  CREATININE  --  0.74  CALCIUM  --  8.8*  MG 1.7  --    ------------------------------------------------------------------------------------------------------------------ estimated creatinine clearance is 59.7 mL/min (by C-G formula based on SCr of 0.74 mg/dL). ------------------------------------------------------------------------------------------------------------------ No results for input(s): TSH, T4TOTAL, T3FREE, THYROIDAB in the last 72 hours.  Invalid input(s):  FREET3  Cardiac Enzymes No results for input(s): CKMB, TROPONINI, MYOGLOBIN in the last 168 hours.  Invalid input(s): CK ------------------------------------------------------------------------------------------------------------------ Invalid input(s): POCBNP ---------------------------------------------------------------------------------------------------------------  RADIOLOGY: X-ray Chest Pa And Lateral  Result Date: 05/06/2016 CLINICAL DATA:  Acute onset of cough and congestion. Initial encounter. EXAM: CHEST  2 VIEW COMPARISON:  Chest radiograph performed 04/22/2016 FINDINGS: The lungs are well-aerated. Mild bibasilar hazy opacities are relatively stable from prior studies and likely reflect chronic bronchitic changes. There is no evidence of pleural effusion or pneumothorax. The heart is normal in size; the mediastinal contour is within normal limits. No acute osseous abnormalities are seen. Clips are noted within the right upper quadrant, reflecting prior cholecystectomy. IMPRESSION: Relatively stable mild bibasilar opacities likely reflect chronic lung changes. No definite acute focal airspace consolidation seen. Electronically Signed   By: Garald Balding M.D.   On: 05/06/2016 19:33    EKG:  Orders placed or performed in visit on 04/16/15  . EKG 12-Lead    ASSESSMENT AND PLAN:  Active Problems:   Dyspnea #1. Acute on chronic respiratory failure with hypoxia due to COPD exacerbation, continue oxygen therapy, now on 2 L of oxygen through nasal cannula, relatively stable #2. COPD exacerbation due to acute bronchitis, attempt sputum cultures if possible, continue antibiotic therapy, steroids, inhalers, following clinically, discussed this pulmonologist, Dr. Humphrey Rolls, Will likely need 1-2 more days to come around #3. Acute bronchitis versus basilar pneumonia, continue levofloxacin, get sputum cultures if possible,  #4. Hyperglycemia due to steroids, supportive therapy, initiate insulin if  needed #5. Essential hypertension, poorly controlled, continue Cozaar, may need to advance medications, follow closely   Management plans discussed with the patient, family and they are in agreement.   DRUG ALLERGIES:  Allergies  Allergen Reactions  . Codeine Nausea And Vomiting    Pt states that she can tolerate Tramadol fine.    CODE STATUS:     Code Status Orders        Start     Ordered   05/06/16 1711  Full code  Continuous     05/06/16 1710    Code Status History    Date Active Date Inactive Code Status Order ID Comments User Context   This patient has a current code status but no historical code status.      TOTAL TIME TAKING CARE OF THIS PATIENT: 40 minutes.    Theodoro Grist M.D on 05/07/2016 at 2:14 PM  Between 7am to 6pm - Pager - [phone]  After 6pm go to [email or web address] - password [email or web address] [facility]  Office  [phone]  CC: Primary care physician; Golden Pop, MD

## 2016-05-07 NOTE — Progress Notes (Signed)
PHARMACIST - PHYSICIAN COMMUNICATION DR:   Ether Griffins CONCERNING: Antibiotic IV to Oral Route Change Policy  RECOMMENDATION: This patient is receiving levofloxacin 750 mg by the intravenous route.  Based on criteria approved by the Pharmacy and Therapeutics Committee, the antibiotic(s) is/are being converted to the equivalent oral dose form(s).   DESCRIPTION: These criteria include:  Patient being treated for a respiratory tract infection, urinary tract infection, cellulitis or clostridium difficile associated diarrhea if on metronidazole  The patient is not neutropenic and does not exhibit a GI malabsorption state  The patient is eating (either orally or via tube) and/or has been taking other orally administered medications for a least 24 hours  The patient is improving clinically and has a Tmax < 100.5  If you have questions about this conversion, please contact the [place], PharmD Pharmacy Resident 05/07/2016 8:45 AM

## 2016-05-08 DIAGNOSIS — J9612 Chronic respiratory failure with hypercapnia: Secondary | ICD-10-CM

## 2016-05-08 DIAGNOSIS — R739 Hyperglycemia, unspecified: Secondary | ICD-10-CM

## 2016-05-08 DIAGNOSIS — J9621 Acute and chronic respiratory failure with hypoxia: Secondary | ICD-10-CM | POA: Diagnosis not present

## 2016-05-08 DIAGNOSIS — J189 Pneumonia, unspecified organism: Secondary | ICD-10-CM

## 2016-05-08 DIAGNOSIS — I10 Essential (primary) hypertension: Secondary | ICD-10-CM | POA: Diagnosis not present

## 2016-05-08 DIAGNOSIS — J209 Acute bronchitis, unspecified: Secondary | ICD-10-CM | POA: Diagnosis not present

## 2016-05-08 DIAGNOSIS — J441 Chronic obstructive pulmonary disease with (acute) exacerbation: Secondary | ICD-10-CM | POA: Diagnosis not present

## 2016-05-08 MED ORDER — GUAIFENESIN ER 600 MG PO TB12: 600.0000 mg | ORAL_TABLET | Freq: Two times a day (BID) | ORAL | 0 refills | Status: DC

## 2016-05-08 MED ORDER — ALPRAZOLAM 0.25 MG PO TABS: 0.2500 mg | ORAL_TABLET | Freq: Three times a day (TID) | ORAL | 0 refills | Status: DC | PRN

## 2016-05-08 MED ORDER — BENZONATATE 100 MG PO CAPS: 100.0000 mg | ORAL_CAPSULE | Freq: Three times a day (TID) | ORAL | 0 refills | Status: DC | PRN

## 2016-05-08 MED ORDER — LEVOFLOXACIN 750 MG PO TABS: 750.0000 mg | ORAL_TABLET | Freq: Every day | ORAL | 0 refills | Status: DC

## 2016-05-08 MED ORDER — GUAIFENESIN 100 MG/5ML PO SOLN: 5.0000 mL | ORAL | 0 refills | Status: DC | PRN

## 2016-05-08 MED ORDER — SENNOSIDES-DOCUSATE SODIUM 8.6-50 MG PO TABS: 1.0000 | ORAL_TABLET | Freq: Every evening | ORAL | 6 refills | Status: DC | PRN

## 2016-05-08 MED ORDER — ALBUTEROL SULFATE (2.5 MG/3ML) 0.083% IN NEBU
2.5000 mg | INHALATION_SOLUTION | Freq: Four times a day (QID) | RESPIRATORY_TRACT | Status: DC
  Administered 2016-05-08: 2.5 mg via RESPIRATORY_TRACT
  Filled 2016-05-08: qty 3

## 2016-05-08 MED ORDER — ALBUTEROL SULFATE (2.5 MG/3ML) 0.083% IN NEBU: 2.5000 mg | INHALATION_SOLUTION | RESPIRATORY_TRACT | 12 refills | Status: DC

## 2016-05-08 MED ORDER — PREDNISONE 10 MG (21) PO TBPK: 10.0000 mg | ORAL_TABLET | Freq: Every day | ORAL | 0 refills | Status: DC

## 2016-05-08 NOTE — Plan of Care (Signed)
Problem: Pain Managment: Goal: General experience of comfort will improve Outcome: Completed/Met Date Met: 05/08/16 Pt with no complaints of pain.  Problem: Physical Regulation: Goal: Ability to maintain clinical measurements within normal limits will improve Outcome: Progressing Pt up ad lib, tolerating well, still on 2L chronic O2. Receiving IV steroids, nebs, and PO abx, with Tessalon for a cough. Pt states she feels better.

## 2016-05-08 NOTE — Discharge Summary (Signed)
[facility] [place] NAME: Joyce Rodriguez    MR#:  [id number]  DATE OF BIRTH:  01-22-1946  DATE OF ADMISSION:  05/06/2016 ADMITTING PHYSICIAN: Loletha Grayer, MD  DATE OF DISCHARGE: 05/08/2016  2:57 PM  PRIMARY CARE PHYSICIAN: Golden Pop, MD     ADMISSION DIAGNOSIS:  Acute chronic respiratory failure Tachycardia  DISCHARGE DIAGNOSIS:  Principal Problem:   Acute on chronic respiratory failure with hypoxia (HCC) Active Problems:   Dyspnea   COPD exacerbation (HCC)   Pneumonia   Hyperglycemia   SECONDARY DIAGNOSIS:   Past Medical History:  Diagnosis Date  . COPD (chronic obstructive pulmonary disease) ([place])   . Hyperlipidemia   . Osteoporosis     .pro HOSPITAL COURSE:  The patient is 72 year old Caucasian female with past medical history significant for history of COPD, end-stage, on chronic oxygen therapy at 2 L of oxygen via nasal cannula, essential hypertension, hyperlipidemia, who presents to the hospital due to COPD exacerbation. Patient admitted of cough, shortness of breath going on for the past 2 weeks, worsening and not improving with 2 rounds of antibiotic therapy. She admitted of a wheezing but denied fevers or chills. Chest x-ray on admission to emergency room revealed bibasilar opacities, likely chronic changes per radiologist. Patient Was initiated on antibiotic therapy, steroids, inhalation therapy and clinically improved. She was felt to be stable to be discharged home today. She was recommended to continue antibiotic therapy, steroid taper and follow up with pulmonologist in about one week after discharge. Discussion by problem: #1. Acute on chronic respiratory failure with hypoxia due to COPD exacerbation, continue oxygen therapy, back on 2 L of oxygen through nasal cannula, initially comfortable  #2. COPD exacerbation due to acute bronchitis/basilar pneumonia, sputum cultures are not collected, continue  antibiotic therapy to complete course, continue steroid taper , inhalers, improved clinically, follow-up with pulmonologist, Dr. Humphrey Rolls within 1 week after discharge #3. Acute bronchitis versus basilar pneumonia, continue levofloxacin, unable to get sputum cultures #4. Hyperglycemia due to steroids, supportive therapy #5. Essential hypertension, satisfactory controlled, continue Cozaar DISCHARGE CONDITIONS:   Stable  CONSULTS OBTAINED:  Treatment Team:  Allyne Gee, MD  DRUG ALLERGIES:   Allergies  Allergen Reactions  . Codeine Nausea And Vomiting    Pt states that she can tolerate Tramadol fine.    DISCHARGE MEDICATIONS:   Discharge Medication List as of 05/08/2016  2:02 PM    START taking these medications   Details  albuterol (PROVENTIL) (2.5 MG/3ML) 0.083% nebulizer solution Take 3 mLs (2.5 mg total) by nebulization every 4 (four) hours., Starting Fri 05/08/2016, Normal    benzonatate (TESSALON) 100 MG capsule Take 1 capsule (100 mg total) by mouth 3 (three) times daily as needed for cough., Starting Fri 05/08/2016, Normal    guaiFENesin (MUCINEX) 600 MG 12 hr tablet Take 1 tablet (600 mg total) by mouth 2 (two) times daily., Starting Fri 05/08/2016, Normal    guaiFENesin (ROBITUSSIN) 100 MG/5ML SOLN Take 5 mLs (100 mg total) by mouth every 4 (four) hours as needed for cough or to loosen phlegm., Starting Fri 05/08/2016, Normal    levofloxacin (LEVAQUIN) 750 MG tablet Take 1 tablet (750 mg total) by mouth daily., Starting Fri 05/08/2016, Normal    predniSONE (STERAPRED UNI-PAK 21 TAB) 10 MG (21) TBPK tablet Take 1 tablet (10 mg total) by mouth daily. Please take 6 pills in the morning on the days 1 and 2 , then taper by 1 pills every  2 days until finished,  thank you, Starting Fri 05/08/2016, Normal    senna-docusate (SENOKOT-S) 8.6-50 MG tablet Take 1 tablet by mouth at bedtime as needed for mild constipation., Starting Fri 05/08/2016, Normal      CONTINUE these medications  which have NOT CHANGED   Details  albuterol (PROVENTIL HFA;VENTOLIN HFA) 108 (90 BASE) MCG/ACT inhaler Inhale 2 puffs into the lungs every 6 (six) hours as needed for shortness of breath., Historical Med    escitalopram (LEXAPRO) 20 MG tablet Take 1 tablet (20 mg total) by mouth daily., Starting Thu 03/19/2016, Normal    Fluticasone-Umeclidin-Vilant (TRELEGY ELLIPTA) 100-62.5-25 MCG/INH AEPB Inhale 1 puff into the lungs daily., Historical Med    losartan (COZAAR) 100 MG tablet Take 1 tablet (100 mg total) by mouth daily., Starting Thu 03/19/2016, Normal    pantoprazole (PROTONIX) 40 MG tablet Take 1 tablet (40 mg total) by mouth daily., Starting Thu 03/19/2016, Normal    rosuvastatin (CRESTOR) 20 MG tablet Take 1 tablet (20 mg total) by mouth daily., Starting Thu 03/19/2016, Normal    traMADol (ULTRAM) 50 MG tablet 1-2 tabs q6 hours as needed for pain, Print    aspirin EC 81 MG tablet Take 81 mg by mouth daily., Historical Med    tiotropium (SPIRIVA) 18 MCG inhalation capsule Place 18 mcg into inhaler and inhale daily., Historical Med      STOP taking these medications     Fluticasone-Salmeterol (ADVAIR) 250-50 MCG/DOSE AEPB          DISCHARGE INSTRUCTIONS:    The patient is to follow-up with primary care physician, primary pulmonologist within 1 week after discharge  If you experience worsening of your admission symptoms, develop shortness of breath, life threatening emergency, suicidal or homicidal thoughts you must seek medical attention immediately by calling 911 or calling your MD immediately  if symptoms less severe.  You Must read complete instructions/literature along with all the possible adverse reactions/side effects for all the Medicines you take and that have been prescribed to you. Take any new Medicines after you have completely understood and accept all the possible adverse reactions/side effects.   Please note  You were cared for by a hospitalist during your  hospital stay. If you have any questions about your discharge medications or the care you received while you were in the hospital after you are discharged, you can call the unit and asked to speak with the hospitalist on call if the hospitalist that took care of you is not available. Once you are discharged, your primary care physician will handle any further medical issues. Please note that NO REFILLS for any discharge medications will be authorized once you are discharged, as it is imperative that you return to your primary care physician (or establish a relationship with a primary care physician if you do not have one) for your aftercare needs so that they can reassess your need for medications and monitor your lab values.    Today   CHIEF COMPLAINT:  No chief complaint on file.   HISTORY OF PRESENT ILLNESS:  Joyce Rodriguez  is a 71 y.o. female with a known history of COPD, end-stage, on chronic oxygen therapy at 2 L of oxygen via nasal cannula, essential hypertension, hyperlipidemia, who presents to the hospital due to COPD exacerbation. Patient admitted of cough, shortness of breath going on for the past 2 weeks, worsening and not improving with 2 rounds of antibiotic therapy. She admitted of a wheezing but denied fevers or chills. Chest  x-ray on admission to emergency room revealed bibasilar opacities, likely chronic changes per radiologist. Patient Was initiated on antibiotic therapy, steroids, inhalation therapy and clinically improved. She was felt to be stable to be discharged home today. She was recommended to continue antibiotic therapy, steroid taper and follow up with pulmonologist in about one week after discharge. Discussion by problem: #1. Acute on chronic respiratory failure with hypoxia due to COPD exacerbation, continue oxygen therapy, back on 2 L of oxygen through nasal cannula, initially comfortable  #2. COPD exacerbation due to acute bronchitis/basilar pneumonia, sputum cultures  are not collected, continue antibiotic therapy to complete course, continue steroid taper , inhalers, improved clinically, follow-up with pulmonologist, Dr. Humphrey Rolls within 1 week after discharge #3. Acute bronchitis versus basilar pneumonia, continue levofloxacin, unable to get sputum cultures #4. Hyperglycemia due to steroids, supportive therapy #5. Essential hypertension, satisfactory controlled, continue Cozaar    VITAL SIGNS:  Blood pressure 130/64, pulse (!) 56, temperature 98.4 F (36.9 C), temperature source Oral, resp. rate 18, height 5\' 3"  (1.6 m), weight 63.9 kg (140 lb 14.4 oz), SpO2 95 %.  I/O:   Intake/Output Summary (Last 24 hours) at 05/08/16 1625 Last data filed at 05/08/16 0900  Gross per 24 hour  Intake              483 ml  Output              700 ml  Net             -217 ml    PHYSICAL EXAMINATION:  GENERAL:  71 y.o.-year-old patient lying in the bed with no acute distress.  EYES: Pupils equal, round, reactive to light and accommodation. No scleral icterus. Extraocular muscles intact.  HEENT: Head atraumatic, normocephalic. Oropharynx and nasopharynx clear.  NECK:  Supple, no jugular venous distention. No thyroid enlargement, no tenderness.  LUNGS: Better air entrance bilaterally posteriorly,  no wheezing, rales,rhonchi or crepitation. No use of accessory muscles of respiration.  CARDIOVASCULAR: S1, S2 normal. No murmurs, rubs, or gallops.  ABDOMEN: Soft, non-tender, non-distended. Bowel sounds present. No organomegaly or mass.  EXTREMITIES: No pedal edema, cyanosis, or clubbing.  NEUROLOGIC: Cranial nerves II through XII are intact. Muscle strength 5/5 in all extremities. Sensation intact. Gait not checked.  PSYCHIATRIC: The patient is alert and oriented x 3.  SKIN: No obvious rash, lesion, or ulcer.   DATA REVIEW:   CBC  Recent Labs Lab 05/06/16 2037  WBC 6.6  HGB 12.8  HCT 37.6  PLT 277    Chemistries   Recent Labs Lab 05/06/16 1721 05/06/16 2037   NA  --  142  K  --  3.5  CL  --  105  CO2  --  31  GLUCOSE  --  112*  BUN  --  13  CREATININE  --  0.74  CALCIUM  --  8.8*  MG 1.7  --     Cardiac Enzymes No results for input(s): TROPONINI in the last 168 hours.  Microbiology Results  Results for orders placed or performed in visit on 03/19/15  Microscopic Examination     Status: None   Collection Time: 03/19/15  8:59 AM  Result Value Ref Range Status   WBC, UA 0-5 0 - 5 /hpf Final   RBC, UA 0-2 0 - 2 /hpf Final   Epithelial Cells (non renal) 0-10 0 - 10 /hpf Final   Mucus, UA Present Not Estab. Final   Bacteria, UA Few None seen/Few Final  RADIOLOGY:  X-ray Chest Pa And Lateral  Result Date: 05/06/2016 CLINICAL DATA:  Acute onset of cough and congestion. Initial encounter. EXAM: CHEST  2 VIEW COMPARISON:  Chest radiograph performed 04/22/2016 FINDINGS: The lungs are well-aerated. Mild bibasilar hazy opacities are relatively stable from prior studies and likely reflect chronic bronchitic changes. There is no evidence of pleural effusion or pneumothorax. The heart is normal in size; the mediastinal contour is within normal limits. No acute osseous abnormalities are seen. Clips are noted within the right upper quadrant, reflecting prior cholecystectomy. IMPRESSION: Relatively stable mild bibasilar opacities likely reflect chronic lung changes. No definite acute focal airspace consolidation seen. Electronically Signed   By: Garald Balding M.D.   On: 05/06/2016 19:33    EKG:   Orders placed or performed in visit on 04/16/15  . EKG 12-Lead      Management plans discussed with the patient, family and they are in agreement.  CODE STATUS:     Code Status Orders        Start     Ordered   05/06/16 1711  Full code  Continuous     05/06/16 1710    Code Status History    Date Active Date Inactive Code Status Order ID Comments User Context   This patient has a current code status but no historical code status.       TOTAL TIME TAKING CARE OF THIS PATIENT: 40 minutes.    Theodoro Grist M.D on 05/08/2016 at 4:25 PM  Between 7am to 6pm - Pager - [phone]  After 6pm go to [email or web address] - password [email or web address] [facility]  Office  [phone]  CC: Primary care physician; Golden Pop, MD

## 2016-05-09 ENCOUNTER — Other Ambulatory Visit: Payer: Self-pay | Admitting: Family Medicine

## 2016-05-09 DIAGNOSIS — M19041 Primary osteoarthritis, right hand: Secondary | ICD-10-CM

## 2016-05-09 DIAGNOSIS — M19042 Primary osteoarthritis, left hand: Principal | ICD-10-CM

## 2016-05-11 NOTE — Telephone Encounter (Signed)
Routing to provider. Appt 05/20/16.

## 2016-05-18 DIAGNOSIS — R42 Dizziness and giddiness: Secondary | ICD-10-CM | POA: Diagnosis not present

## 2016-05-18 DIAGNOSIS — J209 Acute bronchitis, unspecified: Secondary | ICD-10-CM | POA: Diagnosis not present

## 2016-05-18 DIAGNOSIS — J9611 Chronic respiratory failure with hypoxia: Secondary | ICD-10-CM | POA: Diagnosis not present

## 2016-05-18 DIAGNOSIS — J440 Chronic obstructive pulmonary disease with acute lower respiratory infection: Secondary | ICD-10-CM | POA: Diagnosis not present

## 2016-05-20 ENCOUNTER — Encounter: Payer: Self-pay | Admitting: Family Medicine

## 2016-05-20 ENCOUNTER — Ambulatory Visit (INDEPENDENT_AMBULATORY_CARE_PROVIDER_SITE_OTHER): Payer: PPO | Admitting: Family Medicine

## 2016-05-20 VITALS — BP 150/82 | HR 95 | Temp 98.0°F | Ht 63.0 in | Wt 140.0 lb

## 2016-05-20 DIAGNOSIS — J441 Chronic obstructive pulmonary disease with (acute) exacerbation: Secondary | ICD-10-CM | POA: Diagnosis not present

## 2016-05-20 DIAGNOSIS — E7800 Pure hypercholesterolemia, unspecified: Secondary | ICD-10-CM

## 2016-05-20 DIAGNOSIS — I10 Essential (primary) hypertension: Secondary | ICD-10-CM | POA: Diagnosis not present

## 2016-05-20 DIAGNOSIS — Z1231 Encounter for screening mammogram for malignant neoplasm of breast: Secondary | ICD-10-CM | POA: Diagnosis not present

## 2016-05-20 DIAGNOSIS — Z1239 Encounter for other screening for malignant neoplasm of breast: Secondary | ICD-10-CM

## 2016-05-20 MED ORDER — HYDROCHLOROTHIAZIDE 25 MG PO TABS: 25.0000 mg | ORAL_TABLET | Freq: Every day | ORAL | 3 refills | Status: DC

## 2016-05-20 NOTE — Assessment & Plan Note (Signed)
Improving on current care.

## 2016-05-20 NOTE — Progress Notes (Signed)
BP (!) 150/82   Pulse 95   Temp 98 F (36.7 C) (Oral)   Ht 5\' 3"  (1.6 m)   Wt 140 lb (63.5 kg)   SpO2 95%   BMI 24.80 kg/m    Subjective:    Patient ID: Joyce Rodriguez, female    DOB: [birth year], 71 y.o.   MRN: [id number]  HPI: Joyce Rodriguez is a 71 y.o. female  Chief Complaint  Patient presents with  . Follow-up  . Hypertension  Patient with a great deal of medical issues going on with exacerbation of COPD reviewed hospital notes patient now doing better still using home oxygen but has noted blood pressures gone up. COPD stabilizing patient getting her energy back. Taking losartan without problems and faithfully  Relevant past medical, surgical, family and social history reviewed and updated as indicated. Interim medical history since our last visit reviewed. Allergies and medications reviewed and updated.  Review of Systems  Constitutional: Negative.   Respiratory: Positive for cough. Negative for shortness of breath.   Cardiovascular: Negative.     Per HPI unless specifically indicated above     Objective:    BP (!) 150/82   Pulse 95   Temp 98 F (36.7 C) (Oral)   Ht 5\' 3"  (1.6 m)   Wt 140 lb (63.5 kg)   SpO2 95%   BMI 24.80 kg/m   Wt Readings from Last 3 Encounters:  05/20/16 140 lb (63.5 kg)  05/06/16 140 lb 14.4 oz (63.9 kg)  03/19/16 140 lb 3.2 oz (63.6 kg)    Physical Exam  Constitutional: She is oriented to person, place, and time. She appears well-developed and well-nourished. No distress.  HENT:  Head: Normocephalic and atraumatic.  Right Ear: Hearing normal.  Left Ear: Hearing normal.  Nose: Nose normal.  Eyes: Conjunctivae and lids are normal. Right eye exhibits no discharge. Left eye exhibits no discharge. No scleral icterus.  Cardiovascular: Normal rate, regular rhythm and normal heart sounds.   Pulmonary/Chest: Effort normal and breath sounds normal. No respiratory distress.  Musculoskeletal: Normal range of motion.  Neurological: She  is alert and oriented to person, place, and time.  Skin: Skin is intact. No rash noted.  Psychiatric: She has a normal mood and affect. Her speech is normal and behavior is normal. Judgment and thought content normal. Cognition and memory are normal.    Results for orders placed or performed during the hospital encounter of 05/06/16  Magnesium  Result Value Ref Range   Magnesium 1.7 1.7 - 2.4 mg/dL  CBC  Result Value Ref Range   WBC 6.6 3.6 - 11.0 K/uL   RBC 4.06 3.80 - 5.20 MIL/uL   Hemoglobin 12.8 12.0 - 16.0 g/dL   HCT 37.6 35.0 - 47.0 %   MCV 92.6 80.0 - 100.0 fL   MCH 31.4 26.0 - 34.0 pg   MCHC 34.0 32.0 - 36.0 g/dL   RDW 13.5 11.5 - 14.5 %   Platelets 277 150 - 440 K/uL  Basic metabolic panel  Result Value Ref Range   Sodium 142 135 - 145 mmol/L   Potassium 3.5 3.5 - 5.1 mmol/L   Chloride 105 101 - 111 mmol/L   CO2 31 22 - 32 mmol/L   Glucose, Bld 112 (H) 65 - 99 mg/dL   BUN 13 6 - 20 mg/dL   Creatinine, Ser 0.74 0.44 - 1.00 mg/dL   Calcium 8.8 (L) 8.9 - 10.3 mg/dL   GFR calc non Af Amer >60 >60  mL/min   GFR calc Af Amer >60 >60 mL/min   Anion gap 6 5 - 15      Assessment & Plan:   Problem List Items Addressed This Visit      Cardiovascular and Mediastinum   Essential hypertension - Primary    Discuss hypertension Will start hydrochlorothiazide 25 mg      Relevant Medications   hydrochlorothiazide (HYDRODIURIL) 25 MG tablet   Other Relevant Orders   Basic metabolic panel     Respiratory   COPD exacerbation ([place])    Improving on current care.        Other   Hyperlipidemia   Relevant Medications   hydrochlorothiazide (HYDRODIURIL) 25 MG tablet   Other Relevant Orders   Basic metabolic panel    Other Visit Diagnoses    Breast cancer screening       Relevant Orders   MM DIGITAL SCREENING BILATERAL       Follow up plan: Return in about 4 weeks (around 06/17/2016) for BMP.

## 2016-05-20 NOTE — Assessment & Plan Note (Signed)
Discuss hypertension Will start hydrochlorothiazide 25 mg

## 2016-05-21 ENCOUNTER — Encounter: Payer: Self-pay | Admitting: Family Medicine

## 2016-05-21 LAB — BASIC METABOLIC PANEL
BUN / CREAT RATIO: 19 (ref 12–28)
BUN: 14 mg/dL (ref 8–27)
CHLORIDE: 100 mmol/L (ref 96–106)
CO2: 26 mmol/L (ref 18–29)
CREATININE: 0.74 mg/dL (ref 0.57–1.00)
Calcium: 9 mg/dL (ref 8.7–10.3)
GFR calc Af Amer: 96 mL/min/{1.73_m2} (ref >59)
GFR calc non Af Amer: 83 mL/min/{1.73_m2} (ref >59)
GLUCOSE: 84 mg/dL (ref 65–99)
POTASSIUM: 4 mmol/L (ref 3.5–5.2)
SODIUM: 143 mmol/L (ref 134–144)

## 2016-06-02 DIAGNOSIS — J441 Chronic obstructive pulmonary disease with (acute) exacerbation: Secondary | ICD-10-CM | POA: Diagnosis not present

## 2016-06-04 DIAGNOSIS — J441 Chronic obstructive pulmonary disease with (acute) exacerbation: Secondary | ICD-10-CM | POA: Diagnosis not present

## 2016-06-30 DIAGNOSIS — J441 Chronic obstructive pulmonary disease with (acute) exacerbation: Secondary | ICD-10-CM | POA: Diagnosis not present

## 2016-07-02 DIAGNOSIS — J441 Chronic obstructive pulmonary disease with (acute) exacerbation: Secondary | ICD-10-CM | POA: Diagnosis not present

## 2016-07-06 ENCOUNTER — Ambulatory Visit (INDEPENDENT_AMBULATORY_CARE_PROVIDER_SITE_OTHER): Payer: PPO | Admitting: Family Medicine

## 2016-07-06 ENCOUNTER — Encounter: Payer: Self-pay | Admitting: Family Medicine

## 2016-07-06 VITALS — BP 105/74 | HR 112 | Temp 97.9°F | Ht 63.0 in | Wt 138.2 lb

## 2016-07-06 DIAGNOSIS — J441 Chronic obstructive pulmonary disease with (acute) exacerbation: Secondary | ICD-10-CM

## 2016-07-06 DIAGNOSIS — E7800 Pure hypercholesterolemia, unspecified: Secondary | ICD-10-CM | POA: Diagnosis not present

## 2016-07-06 DIAGNOSIS — I10 Essential (primary) hypertension: Secondary | ICD-10-CM

## 2016-07-06 MED ORDER — BENZONATATE 100 MG PO CAPS: 100.0000 mg | ORAL_CAPSULE | Freq: Two times a day (BID) | ORAL | 3 refills | Status: DC | PRN

## 2016-07-06 MED ORDER — AMOXICILLIN-POT CLAVULANATE 875-125 MG PO TABS: 1.0000 | ORAL_TABLET | Freq: Two times a day (BID) | ORAL | 0 refills | Status: DC

## 2016-07-06 NOTE — Assessment & Plan Note (Signed)
Discuss hypertension Will continue hydrochlorothiazide may need to decrease to 12.5 will stop losartan patient will observe blood pressure is may be labile.

## 2016-07-06 NOTE — Assessment & Plan Note (Signed)
Reviewed with ongoing cough will add Augmentin patient education given on risk-benefit of antibiotics.

## 2016-07-06 NOTE — Progress Notes (Signed)
BP 105/74   Pulse (!) 112   Temp 97.9 F (36.6 C) (Oral)   Ht 5\' 3"  (1.6 m)   Wt 138 lb 3.2 oz (62.7 kg)   SpO2 95%   BMI 24.48 kg/m    Subjective:    Patient ID: Joyce Rodriguez, female    DOB: February 25, 1946, 71 y.o.   MRN: [id number]  HPI: Joyce Rodriguez is a 71 y.o. female  Chief Complaint  Patient presents with  . Follow-up  . Hypertension  . Cough    Since after christmas  Patient follow-up COPD exacerbation reviewed hospital notes discharge summaries. Patient's been on levofloxacin but still does cough and a great deal and barely productive but very loose. Using inhalers and doing okay. Taking Crestor without problems. Blood pressure had to stop losartan and his blood pressure got too low hydrochlorothiazide does fine.  Relevant past medical, surgical, family and social history reviewed and updated as indicated. Interim medical history since our last visit reviewed. Allergies and medications reviewed and updated.  Review of Systems  Constitutional: Negative.   Respiratory: Negative.   Cardiovascular: Negative.     Per HPI unless specifically indicated above     Objective:    BP 105/74   Pulse (!) 112   Temp 97.9 F (36.6 C) (Oral)   Ht 5\' 3"  (1.6 m)   Wt 138 lb 3.2 oz (62.7 kg)   SpO2 95%   BMI 24.48 kg/m   Wt Readings from Last 3 Encounters:  07/06/16 138 lb 3.2 oz (62.7 kg)  05/20/16 140 lb (63.5 kg)  05/06/16 140 lb 14.4 oz (63.9 kg)    Physical Exam  Constitutional: She is oriented to person, place, and time. She appears well-developed and well-nourished.  HENT:  Head: Normocephalic and atraumatic.  Eyes: Conjunctivae and EOM are normal.  Neck: Normal range of motion.  Cardiovascular: Normal rate, regular rhythm and normal heart sounds.   Pulmonary/Chest: Effort normal.  Breath sounds diminished  Musculoskeletal: Normal range of motion.  Neurological: She is alert and oriented to person, place, and time.  Skin: No erythema.  Psychiatric: She  has a normal mood and affect. Her behavior is normal. Judgment and thought content normal.    Results for orders placed or performed in visit on 31/51/76  Basic metabolic panel  Result Value Ref Range   Glucose 84 65 - 99 mg/dL   BUN 14 8 - 27 mg/dL   Creatinine, Ser 0.74 0.57 - 1.00 mg/dL   GFR calc non Af Amer 83 >59 mL/min/1.73   GFR calc Af Amer 96 >59 mL/min/1.73   BUN/Creatinine Ratio 19 12 - 28   Sodium 143 134 - 144 mmol/L   Potassium 4.0 3.5 - 5.2 mmol/L   Chloride 100 96 - 106 mmol/L   CO2 26 18 - 29 mmol/L   Calcium 9.0 8.7 - 10.3 mg/dL      Assessment & Plan:   Problem List Items Addressed This Visit      Cardiovascular and Mediastinum   Essential hypertension - Primary    Discuss hypertension Will continue hydrochlorothiazide may need to decrease to 12.5 will stop losartan patient will observe blood pressure is may be labile.      Relevant Orders   Basic metabolic panel     Respiratory   COPD exacerbation ([place])    Reviewed with ongoing cough will add Augmentin patient education given on risk-benefit of antibiotics.      Relevant Medications   benzonatate (TESSALON) 100  MG capsule     Other   Hyperlipidemia    The current medical regimen is effective;  continue present plan and medications.       Relevant Orders   Basic metabolic panel       Follow up plan: Return in about 3 months (around 10/06/2016) for BMP,  Lipids, ALT, AST.

## 2016-07-06 NOTE — Assessment & Plan Note (Signed)
The current medical regimen is effective;  continue present plan and medications.  

## 2016-07-07 ENCOUNTER — Encounter: Payer: Self-pay | Admitting: Family Medicine

## 2016-07-07 LAB — BASIC METABOLIC PANEL
BUN/Creatinine Ratio: 17 (ref 12–28)
BUN: 15 mg/dL (ref 8–27)
CALCIUM: 9.6 mg/dL (ref 8.7–10.3)
CHLORIDE: 94 mmol/L — AB (ref 96–106)
CO2: 28 mmol/L (ref 18–29)
Creatinine, Ser: 0.88 mg/dL (ref 0.57–1.00)
GFR calc non Af Amer: 67 mL/min/{1.73_m2} (ref >59)
GFR, EST AFRICAN AMERICAN: 78 mL/min/{1.73_m2} (ref >59)
GLUCOSE: 93 mg/dL (ref 65–99)
POTASSIUM: 3.7 mmol/L (ref 3.5–5.2)
Sodium: 137 mmol/L (ref 134–144)

## 2016-07-20 DIAGNOSIS — J449 Chronic obstructive pulmonary disease, unspecified: Secondary | ICD-10-CM | POA: Diagnosis not present

## 2016-07-20 DIAGNOSIS — R0602 Shortness of breath: Secondary | ICD-10-CM | POA: Diagnosis not present

## 2016-07-20 DIAGNOSIS — K219 Gastro-esophageal reflux disease without esophagitis: Secondary | ICD-10-CM | POA: Diagnosis not present

## 2016-07-20 DIAGNOSIS — J9611 Chronic respiratory failure with hypoxia: Secondary | ICD-10-CM | POA: Diagnosis not present

## 2016-07-31 DIAGNOSIS — J441 Chronic obstructive pulmonary disease with (acute) exacerbation: Secondary | ICD-10-CM | POA: Diagnosis not present

## 2016-08-02 DIAGNOSIS — J441 Chronic obstructive pulmonary disease with (acute) exacerbation: Secondary | ICD-10-CM | POA: Diagnosis not present

## 2016-08-30 DIAGNOSIS — J441 Chronic obstructive pulmonary disease with (acute) exacerbation: Secondary | ICD-10-CM | POA: Diagnosis not present

## 2016-09-01 DIAGNOSIS — J441 Chronic obstructive pulmonary disease with (acute) exacerbation: Secondary | ICD-10-CM | POA: Diagnosis not present

## 2016-09-29 ENCOUNTER — Other Ambulatory Visit: Payer: Self-pay | Admitting: Family Medicine

## 2016-09-29 DIAGNOSIS — I10 Essential (primary) hypertension: Secondary | ICD-10-CM

## 2016-09-30 DIAGNOSIS — J441 Chronic obstructive pulmonary disease with (acute) exacerbation: Secondary | ICD-10-CM | POA: Diagnosis not present

## 2016-10-02 DIAGNOSIS — J441 Chronic obstructive pulmonary disease with (acute) exacerbation: Secondary | ICD-10-CM | POA: Diagnosis not present

## 2016-10-05 DIAGNOSIS — D2262 Melanocytic nevi of left upper limb, including shoulder: Secondary | ICD-10-CM | POA: Diagnosis not present

## 2016-10-05 DIAGNOSIS — L821 Other seborrheic keratosis: Secondary | ICD-10-CM | POA: Diagnosis not present

## 2016-10-05 DIAGNOSIS — D225 Melanocytic nevi of trunk: Secondary | ICD-10-CM | POA: Diagnosis not present

## 2016-10-05 DIAGNOSIS — D2261 Melanocytic nevi of right upper limb, including shoulder: Secondary | ICD-10-CM | POA: Diagnosis not present

## 2016-10-05 DIAGNOSIS — L538 Other specified erythematous conditions: Secondary | ICD-10-CM | POA: Diagnosis not present

## 2016-10-05 DIAGNOSIS — L820 Inflamed seborrheic keratosis: Secondary | ICD-10-CM | POA: Diagnosis not present

## 2016-10-07 ENCOUNTER — Ambulatory Visit (INDEPENDENT_AMBULATORY_CARE_PROVIDER_SITE_OTHER): Payer: PPO | Admitting: Family Medicine

## 2016-10-07 ENCOUNTER — Encounter: Payer: Self-pay | Admitting: Family Medicine

## 2016-10-07 VITALS — BP 112/72 | HR 86 | Temp 97.9°F | Ht 63.0 in | Wt 140.2 lb

## 2016-10-07 DIAGNOSIS — E7800 Pure hypercholesterolemia, unspecified: Secondary | ICD-10-CM | POA: Diagnosis not present

## 2016-10-07 DIAGNOSIS — I10 Essential (primary) hypertension: Secondary | ICD-10-CM

## 2016-10-07 DIAGNOSIS — J441 Chronic obstructive pulmonary disease with (acute) exacerbation: Secondary | ICD-10-CM

## 2016-10-07 LAB — LP+ALT+AST PICCOLO, WAIVED
ALT (SGPT) Piccolo, Waived: 17 U/L (ref 10–47)
AST (SGOT) Piccolo, Waived: 33 U/L (ref 11–38)
Chol/HDL Ratio Piccolo,Waive: 2.1 mg/dL
Cholesterol Piccolo, Waived: 186 mg/dL (ref <200)
HDL Chol Piccolo, Waived: 29 mg/dL — ABNORMAL LOW (ref >59)
LDL CHOL CALC PICCOLO WAIVED: 70 mg/dL (ref <100)
Triglycerides Piccolo,Waived: 143 mg/dL (ref <150)
VLDL CHOL CALC PICCOLO,WAIVE: 29 mg/dL (ref <30)

## 2016-10-07 MED ORDER — HYDROCHLOROTHIAZIDE 25 MG PO TABS: 25.0000 mg | ORAL_TABLET | Freq: Every day | ORAL | 2 refills | Status: DC

## 2016-10-07 NOTE — Assessment & Plan Note (Signed)
Stable now just completed a round of antibiotics last month.

## 2016-10-07 NOTE — Assessment & Plan Note (Signed)
The current medical regimen is effective;  continue present plan and medications.  

## 2016-10-07 NOTE — Progress Notes (Signed)
BP 112/72 (BP Location: Left Arm, Patient Position: Sitting, Cuff Size: Normal)   Pulse 86   Temp 97.9 F (36.6 C)   Ht 5\' 3"  (1.6 m)   Wt 140 lb 3.2 oz (63.6 kg)   SpO2 95%   BMI 24.84 kg/m    Subjective:    Patient ID: Joyce Rodriguez, female    DOB: 1945/07/03, 71 y.o.   MRN: [id number]  HPI: Joyce Rodriguez is a 71 y.o. female  Chief Complaint  Patient presents with  . Hypertension  . Hyperlipidemia   Blood pressure doing really well taking hydrochlorothiazide 25 mg with good control patient doing well with no side effects. Taking cholesterol medicine without any issues and faithfully.  Relevant past medical, surgical, family and social history reviewed and updated as indicated. Interim medical history since our last visit reviewed. Allergies and medications reviewed and updated.  Review of Systems  Constitutional: Negative.   Respiratory: Negative.   Cardiovascular: Negative.     Per HPI unless specifically indicated above     Objective:    BP 112/72 (BP Location: Left Arm, Patient Position: Sitting, Cuff Size: Normal)   Pulse 86   Temp 97.9 F (36.6 C)   Ht 5\' 3"  (1.6 m)   Wt 140 lb 3.2 oz (63.6 kg)   SpO2 95%   BMI 24.84 kg/m   Wt Readings from Last 3 Encounters:  10/07/16 140 lb 3.2 oz (63.6 kg)  07/06/16 138 lb 3.2 oz (62.7 kg)  05/20/16 140 lb (63.5 kg)    Physical Exam  Constitutional: She is oriented to person, place, and time. She appears well-developed and well-nourished.  HENT:  Head: Normocephalic and atraumatic.  Eyes: Conjunctivae and EOM are normal.  Neck: Normal range of motion.  Cardiovascular: Normal rate, regular rhythm and normal heart sounds.   Pulmonary/Chest: Effort normal and breath sounds normal.  Musculoskeletal: Normal range of motion.  Neurological: She is alert and oriented to person, place, and time.  Skin: No erythema.  Psychiatric: She has a normal mood and affect. Her behavior is normal. Judgment and thought content  normal.    Results for orders placed or performed in visit on 57/84/69  Basic metabolic panel  Result Value Ref Range   Glucose 93 65 - 99 mg/dL   BUN 15 8 - 27 mg/dL   Creatinine, Ser 0.88 0.57 - 1.00 mg/dL   GFR calc non Af Amer 67 >59 mL/min/1.73   GFR calc Af Amer 78 >59 mL/min/1.73   BUN/Creatinine Ratio 17 12 - 28   Sodium 137 134 - 144 mmol/L   Potassium 3.7 3.5 - 5.2 mmol/L   Chloride 94 (L) 96 - 106 mmol/L   CO2 28 18 - 29 mmol/L   Calcium 9.6 8.7 - 10.3 mg/dL      Assessment & Plan:   Problem List Items Addressed This Visit      Cardiovascular and Mediastinum   Essential hypertension - Primary    The current medical regimen is effective;  continue present plan and medications.       Relevant Medications   hydrochlorothiazide (HYDRODIURIL) 25 MG tablet   Other Relevant Orders   Basic metabolic panel   LP+ALT+AST Piccolo, Waived     Respiratory   COPD exacerbation ([place])    Stable now just completed a round of antibiotics last month.      Relevant Medications   SPIRIVA HANDIHALER 18 MCG inhalation capsule   Fluticasone-Salmeterol (ADVAIR DISKUS) 250-50 MCG/DOSE AEPB  Other   Hyperlipidemia    The current medical regimen is effective;  continue present plan and medications.       Relevant Medications   hydrochlorothiazide (HYDRODIURIL) 25 MG tablet   Other Relevant Orders   Basic metabolic panel   LP+ALT+AST Piccolo, Waived       Follow up plan: Return in about 6 months (around 04/08/2017) for Physical Exam.

## 2016-10-08 ENCOUNTER — Encounter: Payer: Self-pay | Admitting: Family Medicine

## 2016-10-08 LAB — BASIC METABOLIC PANEL
BUN/Creatinine Ratio: 14 (ref 12–28)
BUN: 12 mg/dL (ref 8–27)
CALCIUM: 9.7 mg/dL (ref 8.7–10.3)
CO2: 29 mmol/L (ref 20–29)
CREATININE: 0.85 mg/dL (ref 0.57–1.00)
Chloride: 94 mmol/L — ABNORMAL LOW (ref 96–106)
GFR calc Af Amer: 81 mL/min/{1.73_m2} (ref >59)
GFR, EST NON AFRICAN AMERICAN: 70 mL/min/{1.73_m2} (ref >59)
GLUCOSE: 87 mg/dL (ref 65–99)
POTASSIUM: 3.6 mmol/L (ref 3.5–5.2)
Sodium: 138 mmol/L (ref 134–144)

## 2016-11-30 DIAGNOSIS — J441 Chronic obstructive pulmonary disease with (acute) exacerbation: Secondary | ICD-10-CM | POA: Diagnosis not present

## 2016-12-02 DIAGNOSIS — J441 Chronic obstructive pulmonary disease with (acute) exacerbation: Secondary | ICD-10-CM | POA: Diagnosis not present

## 2016-12-31 DIAGNOSIS — J441 Chronic obstructive pulmonary disease with (acute) exacerbation: Secondary | ICD-10-CM | POA: Diagnosis not present

## 2017-01-02 DIAGNOSIS — J441 Chronic obstructive pulmonary disease with (acute) exacerbation: Secondary | ICD-10-CM | POA: Diagnosis not present

## 2017-01-05 DIAGNOSIS — J440 Chronic obstructive pulmonary disease with acute lower respiratory infection: Secondary | ICD-10-CM | POA: Diagnosis not present

## 2017-01-05 DIAGNOSIS — R0602 Shortness of breath: Secondary | ICD-10-CM | POA: Diagnosis not present

## 2017-01-05 DIAGNOSIS — F419 Anxiety disorder, unspecified: Secondary | ICD-10-CM | POA: Diagnosis not present

## 2017-01-05 DIAGNOSIS — J069 Acute upper respiratory infection, unspecified: Secondary | ICD-10-CM | POA: Diagnosis not present

## 2017-01-05 DIAGNOSIS — R05 Cough: Secondary | ICD-10-CM | POA: Diagnosis not present

## 2017-01-05 DIAGNOSIS — R062 Wheezing: Secondary | ICD-10-CM | POA: Diagnosis not present

## 2017-01-21 DIAGNOSIS — J439 Emphysema, unspecified: Secondary | ICD-10-CM | POA: Diagnosis not present

## 2017-01-21 DIAGNOSIS — J449 Chronic obstructive pulmonary disease, unspecified: Secondary | ICD-10-CM | POA: Diagnosis not present

## 2017-01-21 DIAGNOSIS — Z23 Encounter for immunization: Secondary | ICD-10-CM | POA: Diagnosis not present

## 2017-01-21 DIAGNOSIS — R0602 Shortness of breath: Secondary | ICD-10-CM | POA: Diagnosis not present

## 2017-02-17 DIAGNOSIS — Z1231 Encounter for screening mammogram for malignant neoplasm of breast: Secondary | ICD-10-CM | POA: Diagnosis not present

## 2017-03-22 ENCOUNTER — Ambulatory Visit: Payer: PPO

## 2017-04-09 ENCOUNTER — Ambulatory Visit: Payer: Self-pay

## 2017-04-12 ENCOUNTER — Encounter: Payer: PPO | Admitting: Family Medicine

## 2017-04-19 ENCOUNTER — Ambulatory Visit (INDEPENDENT_AMBULATORY_CARE_PROVIDER_SITE_OTHER): Payer: PPO

## 2017-04-19 VITALS — BP 118/78 | HR 66 | Temp 98.2°F | Resp 16 | Ht 63.0 in | Wt 138.4 lb

## 2017-04-19 DIAGNOSIS — I10 Essential (primary) hypertension: Secondary | ICD-10-CM | POA: Diagnosis not present

## 2017-04-19 DIAGNOSIS — E7800 Pure hypercholesterolemia, unspecified: Secondary | ICD-10-CM

## 2017-04-19 DIAGNOSIS — Z0000 Encounter for general adult medical examination without abnormal findings: Secondary | ICD-10-CM

## 2017-04-19 LAB — URINALYSIS, ROUTINE W REFLEX MICROSCOPIC
Bilirubin, UA: NEGATIVE
GLUCOSE, UA: NEGATIVE
Ketones, UA: NEGATIVE
Nitrite, UA: NEGATIVE
PH UA: 5 (ref 5.0–7.5)
PROTEIN UA: NEGATIVE
RBC, UA: NEGATIVE
Specific Gravity, UA: 1.01 (ref 1.005–1.030)
Urobilinogen, Ur: 0.2 mg/dL (ref 0.2–1.0)

## 2017-04-19 LAB — MICROSCOPIC EXAMINATION: BACTERIA UA: NONE SEEN

## 2017-04-19 NOTE — Patient Instructions (Signed)
Joyce Rodriguez , Thank you for taking time to come for your Medicare Wellness Visit. I appreciate your ongoing commitment to your health goals. Please review the following plan we discussed and let me know if I can assist you in the future.   Screening recommendations/referrals: Colonoscopy: completed 06/16/2011 Mammogram: completed 02/17/2017 Bone Density: completed 03/16/2013 Recommended yearly ophthalmology/optometry visit for glaucoma screening and checkup Recommended yearly dental visit for hygiene and checkup  Vaccinations: Influenza vaccine: due now  Pneumococcal vaccine: up to date Tdap vaccine: up to date  Shingles vaccine: up to date    Advanced directives: Advance directive discussed with you today. Even though you declined this today please call our office should you change your mind and we can give you the proper paperwork for you to fill out.  Conditions/risks identified: Recommend drinking at least 6-8 glasses of water a day   Next appointment: Follow up on 04/21/2017 at 2:30 pm with Dr.Crissman. Follow up in one year for your annual wellness exam.    Preventive Care 65 Years and Older, Female Preventive care refers to lifestyle choices and visits with your health care provider that can promote health and wellness. What does preventive care include?  A yearly physical exam. This is also called an annual well check.  Dental exams once or twice a year.  Routine eye exams. Ask your health care provider how often you should have your eyes checked.  Personal lifestyle choices, including:  Daily care of your teeth and gums.  Regular physical activity.  Eating a healthy diet.  Avoiding tobacco and drug use.  Limiting alcohol use.  Practicing safe sex.  Taking low-dose aspirin every day.  Taking vitamin and mineral supplements as recommended by your health care provider. What happens during an annual well check? The services and screenings done by your health care  provider during your annual well check will depend on your age, overall health, lifestyle risk factors, and family history of disease. Counseling  Your health care provider may ask you questions about your:  Alcohol use.  Tobacco use.  Drug use.  Emotional well-being.  Home and relationship well-being.  Sexual activity.  Eating habits.  History of falls.  Memory and ability to understand (cognition).  Work and work Statistician.  Reproductive health. Screening  You may have the following tests or measurements:  Height, weight, and BMI.  Blood pressure.  Lipid and cholesterol levels. These may be checked every 5 years, or more frequently if you are over 76 years old.  Skin check.  Lung cancer screening. You may have this screening every year starting at age 67 if you have a 30-pack-year history of smoking and currently smoke or have quit within the past 15 years.  Fecal occult blood test (FOBT) of the stool. You may have this test every year starting at age 74.  Flexible sigmoidoscopy or colonoscopy. You may have a sigmoidoscopy every 5 years or a colonoscopy every 10 years starting at age 67.  Hepatitis C blood test.  Hepatitis B blood test.  Sexually transmitted disease (STD) testing.  Diabetes screening. This is done by checking your blood sugar (glucose) after you have not eaten for a while (fasting). You may have this done every 1-3 years.  Bone density scan. This is done to screen for osteoporosis. You may have this done starting at age 8.  Mammogram. This may be done every 1-2 years. Talk to your health care provider about how often you should have regular mammograms. Talk  with your health care provider about your test results, treatment options, and if necessary, the need for more tests. Vaccines  Your health care provider may recommend certain vaccines, such as:  Influenza vaccine. This is recommended every year.  Tetanus, diphtheria, and acellular  pertussis (Tdap, Td) vaccine. You may need a Td booster every 10 years.  Zoster vaccine. You may need this after age 18.  Pneumococcal 13-valent conjugate (PCV13) vaccine. One dose is recommended after age 25.  Pneumococcal polysaccharide (PPSV23) vaccine. One dose is recommended after age 89. Talk to your health care provider about which screenings and vaccines you need and how often you need them. This information is not intended to replace advice given to you by your health care provider. Make sure you discuss any questions you have with your health care provider. Document Released: 04/26/2015 Document Revised: 12/18/2015 Document Reviewed: 01/29/2015 Elsevier Interactive Patient Education  2017 [place] Prevention in the Home Falls can cause injuries. They can happen to people of all ages. There are many things you can do to make your home safe and to help prevent falls. What can I do on the outside of my home?  Regularly fix the edges of walkways and driveways and fix any cracks.  Remove anything that might make you trip as you walk through a door, such as a raised step or threshold.  Trim any bushes or trees on the path to your home.  Use bright outdoor lighting.  Clear any walking paths of anything that might make someone trip, such as rocks or tools.  Regularly check to see if handrails are loose or broken. Make sure that both sides of any steps have handrails.  Any raised decks and porches should have guardrails on the edges.  Have any leaves, snow, or ice cleared regularly.  Use sand or salt on walking paths during winter.  Clean up any spills in your garage right away. This includes oil or grease spills. What can I do in the bathroom?  Use night lights.  Install grab bars by the toilet and in the tub and shower. Do not use towel bars as grab bars.  Use non-skid mats or decals in the tub or shower.  If you need to sit down in the shower, use a plastic,  non-slip stool.  Keep the floor dry. Clean up any water that spills on the floor as soon as it happens.  Remove soap buildup in the tub or shower regularly.  Attach bath mats securely with double-sided non-slip rug tape.  Do not have throw rugs and other things on the floor that can make you trip. What can I do in the bedroom?  Use night lights.  Make sure that you have a light by your bed that is easy to reach.  Do not use any sheets or blankets that are too big for your bed. They should not hang down onto the floor.  Have a firm chair that has side arms. You can use this for support while you get dressed.  Do not have throw rugs and other things on the floor that can make you trip. What can I do in the kitchen?  Clean up any spills right away.  Avoid walking on wet floors.  Keep items that you use a lot in easy-to-reach places.  If you need to reach something above you, use a strong step stool that has a grab bar.  Keep electrical cords out of the way.  Do  not use floor polish or wax that makes floors slippery. If you must use wax, use non-skid floor wax.  Do not have throw rugs and other things on the floor that can make you trip. What can I do with my stairs?  Do not leave any items on the stairs.  Make sure that there are handrails on both sides of the stairs and use them. Fix handrails that are broken or loose. Make sure that handrails are as long as the stairways.  Check any carpeting to make sure that it is firmly attached to the stairs. Fix any carpet that is loose or worn.  Avoid having throw rugs at the top or bottom of the stairs. If you do have throw rugs, attach them to the floor with carpet tape.  Make sure that you have a light switch at the top of the stairs and the bottom of the stairs. If you do not have them, ask someone to add them for you. What else can I do to help prevent falls?  Wear shoes that:  Do not have high heels.  Have rubber  bottoms.  Are comfortable and fit you well.  Are closed at the toe. Do not wear sandals.  If you use a stepladder:  Make sure that it is fully opened. Do not climb a closed stepladder.  Make sure that both sides of the stepladder are locked into place.  Ask someone to hold it for you, if possible.  Clearly mark and make sure that you can see:  Any grab bars or handrails.  First and last steps.  Where the edge of each step is.  Use tools that help you move around (mobility aids) if they are needed. These include:  Canes.  Walkers.  Scooters.  Crutches.  Turn on the lights when you go into a dark area. Replace any light bulbs as soon as they burn out.  Set up your furniture so you have a clear path. Avoid moving your furniture around.  If any of your floors are uneven, fix them.  If there are any pets around you, be aware of where they are.  Review your medicines with your doctor. Some medicines can make you feel dizzy. This can increase your chance of falling. Ask your doctor what other things that you can do to help prevent falls. This information is not intended to replace advice given to you by your health care provider. Make sure you discuss any questions you have with your health care provider. Document Released: 01/24/2009 Document Revised: 09/05/2015 Document Reviewed: 05/04/2014 Elsevier Interactive Patient Education  2017 [employer].

## 2017-04-19 NOTE — Progress Notes (Signed)
Subjective:   Joyce Rodriguez is a 72 y.o. female who presents for Medicare Annual (Subsequent) preventive examination.  Review of Systems:   Cardiac Risk Factors include: advanced age (>3men, >48 women);hypertension;dyslipidemia;smoking/ tobacco exposure     Objective:     Vitals: BP 118/78 (BP Location: Left Arm, Patient Position: Sitting)   Pulse 66   Temp 98.2 F (36.8 C) (Oral)   Resp 16   Ht 5\' 3"  (1.6 m)   Wt 138 lb 6.4 oz (62.8 kg)   BMI 24.52 kg/m   Body mass index is 24.52 kg/m.  Advanced Directives 04/19/2017 05/06/2016 09/17/2015  Does Patient Have a Medical Advance Directive? No No No  Does patient want to make changes to medical advance directive? Yes (MAU/Ambulatory/Procedural Areas - Information given) - -  Would patient like information on creating a medical advance directive? - No - Patient declined No - patient declined information    Tobacco Social History   Tobacco Use  Smoking Status Former Smoker  . Types: Cigarettes  . Last attempt to quit: 03/18/2001  . 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 COPD (chronic obstructive pulmonary disease) ([place])   . Hyperlipidemia   . Osteoporosis    Past Surgical History:  Procedure Laterality Date  . ABDOMINAL HYSTERECTOMY    . CHOLECYSTECTOMY     Family History  Problem Relation Age of Onset  . Ovarian cancer Mother   . Emphysema Father   .  Melanoma Sister   . Lung cancer Brother    Social History   Socioeconomic History  . Marital status: Married    Spouse name: None  . Number of children: None  . Years of education: 37  . Highest education level: 12th grade  Social Needs  . Financial resource strain: Not hard at all  . Food insecurity - worry: Never true  . Food insecurity - inability: Never true  . Transportation needs - medical: No  . Transportation needs - non-medical: No  Occupational History  . None  Tobacco Use  . Smoking status: Former Smoker    Types: Cigarettes    Last attempt to quit: 03/18/2001    Years since quitting: 16.0  . Smokeless tobacco: Never Used  Substance and Sexual Activity  . Alcohol use: Yes    Comment: rare  . Drug use: No  . Sexual activity: None  Other Topics Concern  . None  Social History Narrative  . None    Outpatient Encounter Medications as of 04/19/2017  Medication Sig  . albuterol (PROVENTIL HFA;VENTOLIN HFA) 108 (90 BASE) MCG/ACT inhaler Inhale 2 puffs into the lungs every 6 (six) hours as needed for shortness of breath.  Marland Kitchen albuterol (PROVENTIL) (2.5 MG/3ML) 0.083% nebulizer solution Take 3 mLs (2.5 mg total) by nebulization every 4 (four) hours.  Marland Kitchen aspirin EC 81 MG tablet Take 81 mg by mouth daily.  Marland Kitchen escitalopram (  LEXAPRO) 20 MG tablet Take 1 tablet (20 mg total) by mouth daily.  . Fluticasone-Salmeterol (ADVAIR DISKUS) 250-50 MCG/DOSE AEPB Inhale 1 puff into the lungs 2 (two) times daily.  . hydrochlorothiazide (HYDRODIURIL) 25 MG tablet Take 1 tablet (25 mg total) by mouth daily.  . pantoprazole (PROTONIX) 40 MG tablet Take 1 tablet (40 mg total) by mouth daily.  . rosuvastatin (CRESTOR) 20 MG tablet Take 1 tablet (20 mg total) by mouth daily.  Marland Kitchen SPIRIVA HANDIHALER 18 MCG inhalation capsule   . traMADol (ULTRAM) 50 MG tablet Take 1 tablet (50 mg total) by mouth every 12 (twelve) hours as needed. for pain  . ALPRAZolam (XANAX) 0.25 MG tablet Take 1 tablet (0.25 mg  total) by mouth 3 (three) times daily as needed for anxiety. (Patient not taking: Reported on 04/19/2017)  . guaiFENesin (MUCINEX) 600 MG 12 hr tablet Take 1 tablet (600 mg total) by mouth 2 (two) times daily. (Patient not taking: Reported on 04/19/2017)  . [DISCONTINUED] benzonatate (TESSALON) 100 MG capsule Take 1 capsule (100 mg total) by mouth 2 (two) times daily as needed for cough. (Patient not taking: Reported on 04/19/2017)   No facility-administered encounter medications on file as of 04/19/2017.     Activities of Daily Living In your present state of health, do you have any difficulty performing the following activities: 04/19/2017 05/06/2016  Hearing? N N  Vision? Y N  Difficulty concentrating or making decisions? N N  Walking or climbing stairs? Y N  Comment COPD -  Dressing or bathing? N N  Doing errands, shopping? N N  Preparing Food and eating ? N -  Using the Toilet? N -  In the past six months, have you accidently leaked urine? N -  Do you have problems with loss of bowel control? N -  Managing your Medications? N -  Managing your Finances? N -  Housekeeping or managing your Housekeeping? N -  Some recent data might be hidden    Patient Care Team: Guadalupe Maple, MD as PCP - General (Family Medicine) Lollie Sails, MD as Consulting Physician (Gastroenterology) Allyne Gee, MD as Consulting Physician (Internal Medicine)    Assessment:   This is a routine wellness examination for Joyce Rodriguez.  Exercise Activities and Dietary recommendations Current Exercise Habits: The patient does not participate in regular exercise at present, Exercise limited by: respiratory conditions(s)  Goals    . DIET - INCREASE WATER INTAKE     Recommend drinking at least 6-8 glasses of water a day        Fall Risk Fall Risk  04/19/2017 07/06/2016 05/20/2016 09/17/2015 09/12/2014  Falls in the past year? No No No No No   Is the patient's home free of loose throw rugs in walkways, pet beds,  electrical cords, etc?   yes      Grab bars in the bathroom? yes      Handrails on the stairs?   yes      Adequate lighting?   yes  Timed Get Up and Go performed: completed in 8 seconds with no use of assistive devices. Steady gait. No intervention needed at this time  Depression Screen PHQ 2/9 Scores 04/19/2017 01/20/2016 09/17/2015 09/12/2014  PHQ - 2 Score 0 0 0 0  PHQ- 9 Score - 2 1 -     Cognitive Function     6CIT Screen 04/19/2017  What Year? 0 points  What month? 0 points  What time? 0 points  Count  back from 20 0 points  Months in reverse 0 points  Repeat phrase 0 points  Total Score 0    Immunization History  Administered Date(s) Administered  . Influenza, High Dose Seasonal PF 03/19/2016  . Influenza-Unspecified 03/13/2014, 02/20/2015  . Pneumococcal Conjugate-13 03/13/2014  . Pneumococcal-Unspecified 06/27/2001, 01/05/2007  . Td 11/01/2004  . Tdap 11/07/2015  . Zoster 02/09/2008    Qualifies for Shingles Vaccine? Up to date, discussed option for new shingrix vaccine  Screening Tests Health Maintenance  Topic Date Due  . PNA vac Low Risk Adult (2 of 2 - PPSV23) 11/16/2025 (Originally 03/14/2015)  . MAMMOGRAM  02/18/2019  . COLONOSCOPY  06/15/2021  . TETANUS/TDAP  11/06/2025  . INFLUENZA VACCINE  Completed  . DEXA SCAN  Completed  . Hepatitis C Screening  Completed    Cancer Screenings: Lung: Low Dose CT Chest recommended if Age 26-80 years, 30 pack-year currently smoking OR have quit w/in 15years. Patient does not qualify. Breast:  Up to date on Mammogram? Yes   Up to date of Bone Density/Dexa? Yes Colorectal: completed 06/16/2011  Additional Screenings:  Hepatitis B/HIV/Syphillis: not indicated Hepatitis C Screening: done 03/19/2016     Plan:    I have personally reviewed and addressed the Medicare Annual Wellness questionnaire and have noted the following in the patient's chart:  A. Medical and social history B. Use of alcohol, tobacco or illicit drugs   C. Current medications and supplements D. Functional ability and status E.  Nutritional status F.  Physical activity G. Advance directives H. List of other physicians I.  Hospitalizations, surgeries, and ER visits in previous 12 months J.  [place] such as hearing and vision if needed, cognitive and depression L. Referrals and appointments   In addition, I have reviewed and discussed with patient certain preventive protocols, quality metrics, and best practice recommendations. A written personalized care plan for preventive services as well as general preventive health recommendations were provided to patient.   Signed,  Tyler Aas, LPN Nurse Health Advisor   Nurse Notes: patient feels Pantaprazole isnt helping anymore.

## 2017-04-20 LAB — COMPREHENSIVE METABOLIC PANEL
ALK PHOS: 62 IU/L (ref 39–117)
ALT: 17 IU/L (ref 0–32)
AST: 23 IU/L (ref 0–40)
Albumin/Globulin Ratio: 1.5 (ref 1.2–2.2)
Albumin: 4.5 g/dL (ref 3.5–4.8)
BUN/Creatinine Ratio: 17 (ref 12–28)
BUN: 15 mg/dL (ref 8–27)
CHLORIDE: 96 mmol/L (ref 96–106)
CO2: 23 mmol/L (ref 20–29)
CREATININE: 0.87 mg/dL (ref 0.57–1.00)
Calcium: 10.7 mg/dL — ABNORMAL HIGH (ref 8.7–10.3)
GFR calc Af Amer: 78 mL/min/{1.73_m2} (ref >59)
GFR calc non Af Amer: 68 mL/min/{1.73_m2} (ref >59)
GLUCOSE: 88 mg/dL (ref 65–99)
Globulin, Total: 3 g/dL (ref 1.5–4.5)
Potassium: 3.8 mmol/L (ref 3.5–5.2)
Sodium: 141 mmol/L (ref 134–144)
Total Protein: 7.5 g/dL (ref 6.0–8.5)

## 2017-04-20 LAB — CBC
HEMOGLOBIN: 13.5 g/dL (ref 11.1–15.9)
Hematocrit: 40.7 % (ref 34.0–46.6)
MCH: 30.6 pg (ref 26.6–33.0)
MCHC: 33.2 g/dL (ref 31.5–35.7)
MCV: 92 fL (ref 79–97)
PLATELETS: 418 10*3/uL — AB (ref 150–379)
RBC: 4.41 x10E6/uL (ref 3.77–5.28)
RDW: 14.5 % (ref 12.3–15.4)
WBC: 10.2 10*3/uL (ref 3.4–10.8)

## 2017-04-20 LAB — TSH: TSH: 1.33 u[IU]/mL (ref 0.450–4.500)

## 2017-04-21 ENCOUNTER — Ambulatory Visit (INDEPENDENT_AMBULATORY_CARE_PROVIDER_SITE_OTHER): Payer: PPO | Admitting: Family Medicine

## 2017-04-21 ENCOUNTER — Encounter: Payer: Self-pay | Admitting: Family Medicine

## 2017-04-21 VITALS — BP 136/78 | HR 105 | Wt 141.0 lb

## 2017-04-21 DIAGNOSIS — M19042 Primary osteoarthritis, left hand: Secondary | ICD-10-CM

## 2017-04-21 DIAGNOSIS — R739 Hyperglycemia, unspecified: Secondary | ICD-10-CM | POA: Diagnosis not present

## 2017-04-21 DIAGNOSIS — E7800 Pure hypercholesterolemia, unspecified: Secondary | ICD-10-CM

## 2017-04-21 DIAGNOSIS — Z0001 Encounter for general adult medical examination with abnormal findings: Secondary | ICD-10-CM

## 2017-04-21 DIAGNOSIS — I10 Essential (primary) hypertension: Secondary | ICD-10-CM | POA: Diagnosis not present

## 2017-04-21 DIAGNOSIS — Z7189 Other specified counseling: Secondary | ICD-10-CM

## 2017-04-21 DIAGNOSIS — K219 Gastro-esophageal reflux disease without esophagitis: Secondary | ICD-10-CM | POA: Diagnosis not present

## 2017-04-21 DIAGNOSIS — J441 Chronic obstructive pulmonary disease with (acute) exacerbation: Secondary | ICD-10-CM

## 2017-04-21 DIAGNOSIS — M19041 Primary osteoarthritis, right hand: Secondary | ICD-10-CM | POA: Diagnosis not present

## 2017-04-21 DIAGNOSIS — F329 Major depressive disorder, single episode, unspecified: Secondary | ICD-10-CM

## 2017-04-21 DIAGNOSIS — Z1329 Encounter for screening for other suspected endocrine disorder: Secondary | ICD-10-CM

## 2017-04-21 DIAGNOSIS — Z13220 Encounter for screening for lipoid disorders: Secondary | ICD-10-CM | POA: Diagnosis not present

## 2017-04-21 DIAGNOSIS — F341 Dysthymic disorder: Secondary | ICD-10-CM

## 2017-04-21 DIAGNOSIS — Z0000 Encounter for general adult medical examination without abnormal findings: Secondary | ICD-10-CM

## 2017-04-21 MED ORDER — ESCITALOPRAM OXALATE 10 MG PO TABS: 10.0000 mg | ORAL_TABLET | Freq: Every day | ORAL | 4 refills | Status: DC

## 2017-04-21 MED ORDER — TRAMADOL HCL 50 MG PO TABS: 50.0000 mg | ORAL_TABLET | Freq: Two times a day (BID) | ORAL | 0 refills | Status: DC | PRN

## 2017-04-21 MED ORDER — HYDROCHLOROTHIAZIDE 25 MG PO TABS: 25.0000 mg | ORAL_TABLET | Freq: Every day | ORAL | 4 refills | Status: DC

## 2017-04-21 MED ORDER — ESOMEPRAZOLE MAGNESIUM 40 MG PO CPDR: 40.0000 mg | DELAYED_RELEASE_CAPSULE | Freq: Every day | ORAL | 4 refills | Status: DC

## 2017-04-21 MED ORDER — ROSUVASTATIN CALCIUM 20 MG PO TABS: 20.0000 mg | ORAL_TABLET | Freq: Every day | ORAL | 4 refills | Status: DC

## 2017-04-21 NOTE — Assessment & Plan Note (Signed)
The current medical regimen is effective;  continue present plan and medications.  

## 2017-04-21 NOTE — Progress Notes (Signed)
BP 136/78   Pulse (!) 105   Wt 141 lb (64 kg)   SpO2 92%   BMI 24.98 kg/m    Subjective:    Patient ID: Joyce Rodriguez, female    DOB: June 19, 1945, 72 y.o.   MRN: [id number]  HPI: Joyce Rodriguez is a 72 y.o. female  Chief Complaint  Patient presents with  . Annual Exam  patient's primary complaint today is dysphagia with occasional food getting st in her throat and getting much worse as Protonix seems to have quit working. Times has become her best friend and has especially a lot of problems at night. Reviewed medication with patient going through pulmonary without problems.Lexapro is doing okay for nerves Blood pressure cholesterol stable. Wants some tramadol to have for occasional hand osteoarthritis pain and discomfort.  Relevant past medical, surgical, family and social history reviewed and updated as indicated. Interim medical history since our last visit reviewed. Allergies and medications reviewed and updated.  Review of Systems  Constitutional: Negative.   HENT: Negative.   Eyes: Negative.   Respiratory: Negative.   Cardiovascular: Negative.   Gastrointestinal: Negative.   Endocrine: Negative.   Genitourinary: Negative.   Musculoskeletal: Negative.   Skin: Negative.   Allergic/Immunologic: Negative.   Neurological: Negative.   Hematological: Negative.   Psychiatric/Behavioral: Negative.     Per HPI unless specifically indicated above     Objective:    BP 136/78   Pulse (!) 105   Wt 141 lb (64 kg)   SpO2 92%   BMI 24.98 kg/m   Wt Readings from Last 3 Encounters:  04/21/17 141 lb (64 kg)  04/19/17 138 lb 6.4 oz (62.8 kg)  10/07/16 140 lb 3.2 oz (63.6 kg)    Physical Exam  Constitutional: She is oriented to person, place, and time. She appears well-developed and well-nourished.  HENT:  Head: Normocephalic and atraumatic.  Right Ear: External ear normal.  Left Ear: External ear normal.  Nose: Nose normal.  Mouth/Throat: Oropharynx is clear and  moist.  Eyes: Conjunctivae and EOM are normal. Pupils are equal, round, and reactive to light.  Neck: Normal range of motion. Neck supple. Carotid bruit is not present.  Cardiovascular: Normal rate, regular rhythm and normal heart sounds.  No murmur heard. Pulmonary/Chest: Effort normal and breath sounds normal. She exhibits no mass. Right breast exhibits no mass, no skin change and no tenderness. Left breast exhibits no mass, no skin change and no tenderness. Breasts are symmetrical.  Abdominal: Soft. Bowel sounds are normal. There is no hepatosplenomegaly.  Musculoskeletal: Normal range of motion.  Neurological: She is alert and oriented to person, place, and time.  Skin: No rash noted.  Psychiatric: She has a normal mood and affect. Her behavior is normal. Judgment and thought content normal.    Results for orders placed or performed in visit on 04/19/17  Microscopic Examination  Result Value Ref Range   WBC, UA 0-5 0 - 5 /hpf   RBC, UA 0-2 0 - 2 /hpf   Epithelial Cells (non renal) 0-10 0 - 10 /hpf   Renal Epithel, UA 0-10 (A) None seen /hpf   Casts Present None seen /lpf   Cast Type Hyaline casts N/A   Mucus, UA Present Not Estab.   Bacteria, UA None seen None seen/Few  Urinalysis, Routine w reflex microscopic  Result Value Ref Range   Specific Gravity, UA 1.010 1.005 - 1.030   pH, UA 5.0 5.0 - 7.5   Color, UA Yellow Yellow  Appearance Ur Hazy (A) Clear   Leukocytes, UA Trace (A) Negative   Protein, UA Negative Negative/Trace   Glucose, UA Negative Negative   Ketones, UA Negative Negative   RBC, UA Negative Negative   Bilirubin, UA Negative Negative   Urobilinogen, Ur 0.2 0.2 - 1.0 mg/dL   Nitrite, UA Negative Negative   Microscopic Examination See below:   TSH  Result Value Ref Range   TSH 1.330 0.450 - 4.500 uIU/mL  CBC  Result Value Ref Range   WBC 10.2 3.4 - 10.8 x10E3/uL   RBC 4.41 3.77 - 5.28 x10E6/uL   Hemoglobin 13.5 11.1 - 15.9 g/dL   Hematocrit 40.7 34.0  - 46.6 %   MCV 92 79 - 97 fL   MCH 30.6 26.6 - 33.0 pg   MCHC 33.2 31.5 - 35.7 g/dL   RDW 14.5 12.3 - 15.4 %   Platelets 418 (H) 150 - 379 x10E3/uL  Comp Met (CMET)  Result Value Ref Range   Glucose 88 65 - 99 mg/dL   BUN 15 8 - 27 mg/dL   Creatinine, Ser 0.87 0.57 - 1.00 mg/dL   GFR calc non Af Amer 68 >59 mL/min/1.73   GFR calc Af Amer 78 >59 mL/min/1.73   BUN/Creatinine Ratio 17 12 - 28   Sodium 141 134 - 144 mmol/L   Potassium 3.8 3.5 - 5.2 mmol/L   Chloride 96 96 - 106 mmol/L   CO2 23 20 - 29 mmol/L   Calcium 10.7 (H) 8.7 - 10.3 mg/dL   Total Protein 7.5 6.0 - 8.5 g/dL   Albumin 4.5 3.5 - 4.8 g/dL   Globulin, Total 3.0 1.5 - 4.5 g/dL   Albumin/Globulin Ratio 1.5 1.2 - 2.2   Bilirubin Total <0.2 0.0 - 1.2 mg/dL   Alkaline Phosphatase 62 39 - 117 IU/L   AST 23 0 - 40 IU/L   ALT 17 0 - 32 IU/L      Assessment & Plan:   Problem List Items Addressed This Visit      Cardiovascular and Mediastinum   Essential hypertension - Primary    The current medical regimen is effective;  continue present plan and medications.       Relevant Medications   hydrochlorothiazide (HYDRODIURIL) 25 MG tablet   rosuvastatin (CRESTOR) 20 MG tablet     Respiratory   COPD exacerbation (HCC)    The current medical regimen is effective;  continue present plan and medications.         Digestive   Acid reflux    Discuss worsening reflux and dysphagia will refer to GI to further evaluate. Discuss elevation of head of bed  change to Nexium      Relevant Medications   esomeprazole (NEXIUM) 40 MG capsule   Other Relevant Orders   Ambulatory referral to Gastroenterology     Musculoskeletal and Integument   Osteoarthritis of both hands    The current medical regimen is effective;  continue present plan and medications.       Relevant Medications   traMADol (ULTRAM) 50 MG tablet     Other   Major depression, chronic (Chronic)    The current medical regimen is effective;  continue  present plan and medications.       Relevant Medications   escitalopram (LEXAPRO) 10 MG tablet   Hyperlipidemia    The current medical regimen is effective;  continue present plan and medications.       Relevant Medications   hydrochlorothiazide (HYDRODIURIL)  25 MG tablet   rosuvastatin (CRESTOR) 20 MG tablet   Hyperglycemia   Advanced care planning/counseling discussion    A voluntary discussion about advance care planning including the explanation and discussion of advance directives was extensively discussed  with the patient.  Explanation about the health care proxy and Living will was reviewed and packet with forms with explanation of how to fill them out was given.          Other Visit Diagnoses    Thyroid disorder screen       Screening cholesterol level       PE (physical exam), annual           Follow up plan: Return in about 6 months (around 10/19/2017) for BMP,  Lipids, ALT, AST.

## 2017-04-21 NOTE — Assessment & Plan Note (Addendum)
Discuss worsening reflux and dysphagia will refer to GI to further evaluate. Discuss elevation of head of bed  change to Nexium

## 2017-04-21 NOTE — Assessment & Plan Note (Signed)
A voluntary discussion about advance care planning including the explanation and discussion of advance directives was extensively discussed  with the patient.  Explanation about the health care proxy and Living will was reviewed and packet with forms with explanation of how to fill them out was given.    

## 2017-05-07 ENCOUNTER — Other Ambulatory Visit: Payer: Self-pay

## 2017-05-07 ENCOUNTER — Ambulatory Visit (INDEPENDENT_AMBULATORY_CARE_PROVIDER_SITE_OTHER): Payer: PPO | Admitting: Gastroenterology

## 2017-05-07 ENCOUNTER — Encounter: Payer: Self-pay | Admitting: Gastroenterology

## 2017-05-07 VITALS — BP 116/74 | HR 91 | Temp 98.6°F | Ht 63.0 in | Wt 140.0 lb

## 2017-05-07 DIAGNOSIS — R1310 Dysphagia, unspecified: Secondary | ICD-10-CM | POA: Diagnosis not present

## 2017-05-07 DIAGNOSIS — K219 Gastro-esophageal reflux disease without esophagitis: Secondary | ICD-10-CM | POA: Diagnosis not present

## 2017-05-07 DIAGNOSIS — R1319 Other dysphagia: Secondary | ICD-10-CM

## 2017-05-07 NOTE — Progress Notes (Signed)
Cephas Darby, MD [address]  [place]  [place], [place] [address]  Main: [phone]  Fax: [phone]    Gastroenterology Consultation  Referring Provider:     Guadalupe Maple, MD Primary Care Physician:  Guadalupe Maple, MD Primary Gastroenterologist:  Dr. Cephas Darby Reason for Consultation:     Dysphagia, GERD        HPI:   Joyce Rodriguez is a 72 y.o. female referred by Dr. Guadalupe Maple, MD  for consultation & management of chronic GERD. Patient reports that she has been dealing with heartburn for several years and has been on Protonix. Lately, her symptoms have gotten worse associated with difficulty swallowing particularly bread, regurgitation, nocturnal heartburn. Dr. Jeananne Rama switch from Protonix to Nexium 40 mg daily and she reports that her symptoms are significantly better. She otherwise denies any GI symptoms to date.  NSAIDs: None  Antiplts/Anticoagulants/Anti thrombotics: None  GI Procedures: Not had an EGD before She reports having had a colonoscopy a few years ago and is not due for one yet.  Past Medical History:  Diagnosis Date  . COPD (chronic obstructive pulmonary disease) ([place])   . Hyperlipidemia   . Osteoporosis     Past Surgical History:  Procedure Laterality Date  . ABDOMINAL HYSTERECTOMY    . CHOLECYSTECTOMY       Current Outpatient Medications:  .  albuterol (PROVENTIL HFA;VENTOLIN HFA) 108 (90 BASE) MCG/ACT inhaler, Inhale 2 puffs into the lungs every 6 (six) hours as needed for shortness of breath., Disp: , Rfl:  .  aspirin EC 81 MG tablet, Take 81 mg by mouth daily., Disp: , Rfl:  .  escitalopram (LEXAPRO) 10 MG tablet, Take 1 tablet (10 mg total) by mouth daily., Disp: 90 tablet, Rfl: 4 .  esomeprazole (NEXIUM) 40 MG capsule, Take 1 capsule (40 mg total) by mouth daily at 12 noon., Disp: 90 capsule, Rfl: 4 .  Fluticasone-Salmeterol (ADVAIR DISKUS) 250-50 MCG/DOSE AEPB, Inhale 1 puff into the lungs 2 (two) times  daily., Disp: , Rfl:  .  hydrochlorothiazide (HYDRODIURIL) 25 MG tablet, Take 1 tablet (25 mg total) by mouth daily., Disp: 90 tablet, Rfl: 4 .  rosuvastatin (CRESTOR) 20 MG tablet, Take 1 tablet (20 mg total) by mouth daily., Disp: 90 tablet, Rfl: 4 .  SPIRIVA HANDIHALER 18 MCG inhalation capsule, , Disp: , Rfl:  .  traMADol (ULTRAM) 50 MG tablet, Take 1 tablet (50 mg total) by mouth every 12 (twelve) hours as needed. for pain, Disp: 100 tablet, Rfl: 0 Family History  Problem Relation Age of Onset  . Ovarian cancer Mother   . Emphysema Father   . Melanoma Sister   . Lung cancer Brother      Social History   Tobacco Use  . Smoking status: Former Smoker    Types: Cigarettes    Last attempt to quit: 03/18/2001    Years since quitting: 16.1  . Smokeless tobacco: Never Used  Substance Use Topics  . Alcohol use: Yes    Comment: rare  . Drug use: No    Allergies as of 05/07/2017 - Review Complete 05/07/2017  Allergen Reaction Noted  . Codeine Nausea And Vomiting 07/14/2014    Review of Systems:    All systems reviewed and negative except where noted in HPI.   Physical Exam:  BP 116/74   Pulse 91   Temp 98.6 F (37 C) (Oral)   Ht 5\' 3"  (1.6 m)   Wt 140 lb (  63.5 kg)   BMI 24.80 kg/m  No LMP recorded. Patient has had a hysterectomy.  General:   Alert,  Well-developed, well-nourished, pleasant and cooperative in NAD Head:  Normocephalic and atraumatic. Eyes:  Sclera clear, no icterus.   Conjunctiva pink. Ears:  Normal auditory acuity. Nose:  No deformity, discharge, or lesions. Mouth:  No deformity or lesions,oropharynx pink & moist. Neck:  Supple; no masses or thyromegaly. Lungs:  Respirations even and unlabored.  Clear throughout to auscultation.   No wheezes, crackles, or rhonchi. No acute distress. Heart:  Regular rate and rhythm; no murmurs, clicks, rubs, or gallops. Abdomen:  Normal bowel sounds. Soft, non-tender and non-distended without masses, hepatosplenomegaly  or hernias noted.  No guarding or rebound tenderness.   Rectal: Not performed Msk:  Symmetrical without gross deformities. Good, equal movement & strength bilaterally. Pulses:  Normal pulses noted. Extremities:  No clubbing or edema.  No cyanosis. Neurologic:  Alert and oriented x3;  grossly normal neurologically. Skin:  Intact without significant lesions or rashes. No jaundice. Lymph Nodes:  No significant cervical adenopathy. Psych:  Alert and cooperative. Normal mood and affect.  Imaging Studies: Reviewed  Assessment and Plan:   Joyce Rodriguez is a 72 y.o. female with history of COPD, chronic GERD cousins with worsening of GERD symptoms as well as dysphagia. Her symptoms are currently under control on Nexium 40 mg daily along with ranitidine. Given her age and chronicity of symptoms, I recommend performing an upper endoscopy to evaluate for any stricture and Barrett's esophagus  - She will continue Nexium 40 mg daily for now   Follow up in 3 months   Cephas Darby, MD

## 2017-05-11 ENCOUNTER — Encounter
Admission: RE | Disposition: A | Discharge status: Home / Self Care | Payer: Self-pay | Source: Ambulatory Visit | Attending: Gastroenterology

## 2017-05-11 ENCOUNTER — Ambulatory Visit: Payer: PPO | Admitting: Anesthesiology

## 2017-05-11 ENCOUNTER — Ambulatory Visit
Admission: RE | Admit: 2017-05-11 | Discharge: 2017-05-11 | Disposition: A | Discharge status: Home / Self Care | Payer: PPO | Source: Ambulatory Visit | Attending: Gastroenterology | Admitting: Gastroenterology

## 2017-05-11 ENCOUNTER — Encounter: Payer: Self-pay | Admitting: Anesthesiology

## 2017-05-11 DIAGNOSIS — K2960 Other gastritis without bleeding: Secondary | ICD-10-CM | POA: Diagnosis not present

## 2017-05-11 DIAGNOSIS — K2950 Unspecified chronic gastritis without bleeding: Secondary | ICD-10-CM | POA: Diagnosis not present

## 2017-05-11 DIAGNOSIS — J449 Chronic obstructive pulmonary disease, unspecified: Secondary | ICD-10-CM | POA: Insufficient documentation

## 2017-05-11 DIAGNOSIS — K222 Esophageal obstruction: Secondary | ICD-10-CM

## 2017-05-11 DIAGNOSIS — J441 Chronic obstructive pulmonary disease with (acute) exacerbation: Secondary | ICD-10-CM | POA: Diagnosis not present

## 2017-05-11 DIAGNOSIS — K219 Gastro-esophageal reflux disease without esophagitis: Secondary | ICD-10-CM | POA: Insufficient documentation

## 2017-05-11 DIAGNOSIS — R1310 Dysphagia, unspecified: Secondary | ICD-10-CM | POA: Diagnosis not present

## 2017-05-11 DIAGNOSIS — Z79899 Other long term (current) drug therapy: Secondary | ICD-10-CM | POA: Insufficient documentation

## 2017-05-11 DIAGNOSIS — E785 Hyperlipidemia, unspecified: Secondary | ICD-10-CM | POA: Insufficient documentation

## 2017-05-11 DIAGNOSIS — Z885 Allergy status to narcotic agent status: Secondary | ICD-10-CM | POA: Insufficient documentation

## 2017-05-11 DIAGNOSIS — F329 Major depressive disorder, single episode, unspecified: Secondary | ICD-10-CM | POA: Diagnosis not present

## 2017-05-11 DIAGNOSIS — K259 Gastric ulcer, unspecified as acute or chronic, without hemorrhage or perforation: Secondary | ICD-10-CM | POA: Diagnosis not present

## 2017-05-11 DIAGNOSIS — M1990 Unspecified osteoarthritis, unspecified site: Secondary | ICD-10-CM | POA: Insufficient documentation

## 2017-05-11 DIAGNOSIS — Z7982 Long term (current) use of aspirin: Secondary | ICD-10-CM | POA: Diagnosis not present

## 2017-05-11 DIAGNOSIS — Z87891 Personal history of nicotine dependence: Secondary | ICD-10-CM | POA: Diagnosis not present

## 2017-05-11 DIAGNOSIS — R1314 Dysphagia, pharyngoesophageal phase: Secondary | ICD-10-CM | POA: Diagnosis present

## 2017-05-11 DIAGNOSIS — K449 Diaphragmatic hernia without obstruction or gangrene: Secondary | ICD-10-CM | POA: Insufficient documentation

## 2017-05-11 DIAGNOSIS — I10 Essential (primary) hypertension: Secondary | ICD-10-CM | POA: Diagnosis not present

## 2017-05-11 DIAGNOSIS — M810 Age-related osteoporosis without current pathological fracture: Secondary | ICD-10-CM | POA: Insufficient documentation

## 2017-05-11 HISTORY — PX: ESOPHAGOGASTRODUODENOSCOPY (EGD) WITH PROPOFOL: SHX5813

## 2017-05-11 SURGERY — ESOPHAGOGASTRODUODENOSCOPY (EGD) WITH PROPOFOL
Anesthesia: General

## 2017-05-11 MED ORDER — IPRATROPIUM-ALBUTEROL 0.5-2.5 (3) MG/3ML IN SOLN
RESPIRATORY_TRACT | Status: AC
  Filled 2017-05-11: qty 3

## 2017-05-11 MED ORDER — ESOMEPRAZOLE MAGNESIUM 40 MG PO CPDR: 40.0000 mg | DELAYED_RELEASE_CAPSULE | Freq: Two times a day (BID) | ORAL | 0 refills | Status: DC

## 2017-05-11 MED ORDER — PROPOFOL 500 MG/50ML IV EMUL
INTRAVENOUS | Status: DC | PRN
  Administered 2017-05-11: 160 ug/kg/min via INTRAVENOUS

## 2017-05-11 MED ORDER — LIDOCAINE HCL (CARDIAC) 20 MG/ML IV SOLN
INTRAVENOUS | Status: DC | PRN
  Administered 2017-05-11: 50 mg via INTRAVENOUS

## 2017-05-11 MED ORDER — ONDANSETRON HCL 4 MG/2ML IJ SOLN: 4.0000 mg | Freq: Once | INTRAMUSCULAR | Status: DC | PRN

## 2017-05-11 MED ORDER — SODIUM CHLORIDE 0.9 % IV SOLN
INTRAVENOUS | Status: DC
  Administered 2017-05-11: 13:00:00 via INTRAVENOUS

## 2017-05-11 MED ORDER — IPRATROPIUM-ALBUTEROL 0.5-2.5 (3) MG/3ML IN SOLN: 3.0000 mL | Freq: Four times a day (QID) | RESPIRATORY_TRACT | Status: DC

## 2017-05-11 MED ORDER — PROPOFOL 500 MG/50ML IV EMUL
INTRAVENOUS | Status: AC
  Filled 2017-05-11: qty 50

## 2017-05-11 MED ORDER — FENTANYL CITRATE (PF) 100 MCG/2ML IJ SOLN: 25.0000 ug | INTRAMUSCULAR | Status: DC | PRN

## 2017-05-11 MED ORDER — PROPOFOL 10 MG/ML IV BOLUS
INTRAVENOUS | Status: DC | PRN
  Administered 2017-05-11: 20 mg via INTRAVENOUS
  Administered 2017-05-11: 40 mg via INTRAVENOUS

## 2017-05-11 MED ORDER — IPRATROPIUM-ALBUTEROL 0.5-2.5 (3) MG/3ML IN SOLN
3.0000 mL | Freq: Four times a day (QID) | RESPIRATORY_TRACT | Status: DC
  Administered 2017-05-11 (×2): 3 mL via RESPIRATORY_TRACT

## 2017-05-11 NOTE — H&P (Signed)
Joyce Darby, MD [address]  [place]  [place], [place] [address]  Main: [phone]  Fax: [phone] Pager: [phone]  Primary Care Physician:  Guadalupe Maple, MD Primary Gastroenterologist:  Dr. Cephas Rodriguez  Pre-Procedure History & Physical: HPI:  Joyce Rodriguez is a 72 y.o. female is here for an endoscopy.   Past Medical History:  Diagnosis Date  . COPD (chronic obstructive pulmonary disease) ([place])   . Hyperlipidemia   . Osteoporosis     Past Surgical History:  Procedure Laterality Date  . ABDOMINAL HYSTERECTOMY    . CHOLECYSTECTOMY      Prior to Admission medications   Medication Sig Start Date End Date Taking? Authorizing Provider  albuterol (PROVENTIL HFA;VENTOLIN HFA) 108 (90 BASE) MCG/ACT inhaler Inhale 2 puffs into the lungs every 6 (six) hours as needed for shortness of breath.   Yes [provider]  aspirin EC 81 MG tablet Take 81 mg by mouth daily.   Yes [provider]  escitalopram (LEXAPRO) 10 MG tablet Take 1 tablet (10 mg total) by mouth daily. 04/21/17  Yes Crissman, Jeannette How, MD  esomeprazole (NEXIUM) 40 MG capsule Take 1 capsule (40 mg total) by mouth daily at 12 noon. 04/21/17  Yes Crissman, Jeannette How, MD  Fluticasone-Salmeterol (ADVAIR DISKUS) 250-50 MCG/DOSE AEPB Inhale 1 puff into the lungs 2 (two) times daily.   Yes [provider]  hydrochlorothiazide (HYDRODIURIL) 25 MG tablet Take 1 tablet (25 mg total) by mouth daily. 04/21/17  Yes Crissman, Jeannette How, MD  rosuvastatin (CRESTOR) 20 MG tablet Take 1 tablet (20 mg total) by mouth daily. 04/21/17  Yes Guadalupe Maple, MD  SPIRIVA HANDIHALER 18 MCG inhalation capsule  09/04/16  Yes [provider]  traMADol (ULTRAM) 50 MG tablet Take 1 tablet (50 mg total) by mouth every 12 (twelve) hours as needed. for pain 04/21/17  Yes Guadalupe Maple, MD    Allergies as of 05/07/2017 - Review Complete 05/07/2017  Allergen Reaction Noted  . Codeine Nausea And Vomiting  07/14/2014    Family History  Problem Relation Age of Onset  . Ovarian cancer Mother   . Emphysema Father   . Melanoma Sister   . Lung cancer Brother     Social History   Socioeconomic History  . Marital status: Married    Spouse name: Not on file  . Number of children: Not on file  . Years of education: 66  . Highest education level: 12th grade  Social Needs  . Financial resource strain: Not hard at all  . Food insecurity - worry: Never true  . Food insecurity - inability: Never true  . Transportation needs - medical: No  . Transportation needs - non-medical: No  Occupational History  . Not on file  Tobacco Use  . Smoking status: Former Smoker    Types: Cigarettes    Last attempt to quit: 03/18/2001    Years since quitting: 16.1  . Smokeless tobacco: Never Used  Substance and Sexual Activity  . Alcohol use: Yes    Comment: rare  . Drug use: No  . Sexual activity: Not on file  Other Topics Concern  . Not on file  Social History Narrative  . Not on file    Review of Systems: See HPI, otherwise negative ROS  Physical Exam: BP 127/83   Pulse 90   Temp (!) 97 F (36.1 C) (Tympanic)   Resp 18   Ht 5\' 3"  (1.6 m)  Wt 140 lb (63.5 kg)   SpO2 94%   BMI 24.80 kg/m  General:   Alert,  pleasant and cooperative in NAD Head:  Normocephalic and atraumatic. Neck:  Supple; no masses or thyromegaly. Lungs:  Clear throughout to auscultation.    Heart:  Regular rate and rhythm. Abdomen:  Soft, nontender and nondistended. Normal bowel sounds, without guarding, and without rebound.   Neurologic:  Alert and  oriented x4;  grossly normal neurologically.  Impression/Plan: Joyce Rodriguez is here for an endoscopy to be performed for dysphagia, GERD  Risks, benefits, limitations, and alternatives regarding  endoscopy have been reviewed with the patient.  Questions have been answered.  All parties agreeable.   Sherri Sear, MD  05/11/2017, 12:46 PM

## 2017-05-11 NOTE — Transfer of Care (Signed)
Immediate Anesthesia Transfer of Care Note  Patient: Joyce Rodriguez  Procedure(s) Performed: ESOPHAGOGASTRODUODENOSCOPY (EGD) WITH PROPOFOL (N/A )  Patient Location: PACU  Anesthesia Type:General  Level of Consciousness: awake and alert   Airway & Oxygen Therapy: Patient Spontanous Breathing and Patient connected to nasal cannula oxygen  Post-op Assessment: Report given to RN and Post -op Vital signs reviewed and stable  Post vital signs: Reviewed and stable  Last Vitals:  Vitals:   05/11/17 1229 05/11/17 1402  BP: 127/83 112/75  Pulse: 90 97  Resp: 18 16  Temp: (!) 36.1 C (!) 36.3 C  SpO2: 94% 97%    Last Pain:  Vitals:   05/11/17 1402  TempSrc: Tympanic         Complications: No apparent anesthesia complications

## 2017-05-11 NOTE — Op Note (Signed)
[facility] Patient Name: Joyce Rodriguez Procedure Date: 05/11/2017 1:32 PM MRN: [id number] Account #: [id number] Date of Birth: Aug 25, 1945 Admit Type: Outpatient Age: 72 Room: [facility] END[facility] ROOM 4 Gender: Female Note Status: Finalized Procedure:            Upper GI endoscopy Indications:          Esophageal dysphagia, Follow-up of gastro-esophageal                        reflux disease Providers:            Lin Landsman MD, MD Referring MD:         Guadalupe Maple, MD (Referring MD) Medicines:            Monitored Anesthesia Care Complications:        No immediate complications. Estimated blood loss: None. Procedure:            Pre-Anesthesia Assessment:                       - Prior to the procedure, a History and Physical was                        performed, and patient medications and allergies were                        reviewed. The patient is competent. The risks and                        benefits of the procedure and the sedation options and                        risks were discussed with the patient. All questions                        were answered and informed consent was obtained.                        Patient identification and proposed procedure were                        verified by the physician, the nurse, the                        anesthesiologist, the anesthetist and the technician in                        the pre-procedure area in the procedure room. Mental                        Status Examination: alert and oriented. Airway                        Examination: normal oropharyngeal airway and neck                        mobility. Respiratory Examination: clear to                        auscultation. CV Examination: normal. Prophylactic  Antibiotics: The patient does not require prophylactic                        antibiotics. Prior Anticoagulants: The patient has                        taken  aspirin, last dose was 1 day prior to procedure.                        ASA Grade Assessment: III - A patient with severe                        systemic disease. After reviewing the risks and                        benefits, the patient was deemed in satisfactory                        condition to undergo the procedure. The anesthesia plan                        was to use monitored anesthesia care (MAC). Immediately                        prior to administration of medications, the patient was                        re-assessed for adequacy to receive sedatives. The                        heart rate, respiratory rate, oxygen saturations, blood                        pressure, adequacy of pulmonary ventilation, and                        response to care were monitored throughout the                        procedure. The physical status of the patient was                        re-assessed after the procedure.                       After obtaining informed consent, the endoscope was                        passed under direct vision. Throughout the procedure,                        the patient's blood pressure, pulse, and oxygen                        saturations were monitored continuously. The Endoscope                        was introduced through the mouth, and advanced to the  second part of duodenum. The upper GI endoscopy was                        accomplished without difficulty. The patient tolerated                        the procedure fairly well. Findings:      The duodenal bulb and second portion of the duodenum were normal.      A 4 cm hiatal hernia was present.      A few dispersed, diminutive non-bleeding erosions were found in the       gastric body and in the gastric antrum. There were no stigmata of recent       bleeding. Biopsies were taken with a cold forceps for Helicobacter       pylori testing.      The cardia and gastric fundus were normal  on retroflexion.      A low-grade of narrowing Schatzki ring (acquired) was found at the       gastroesophageal junction. A TTS dilator was passed through the scope.       Dilation with a 12-13.5-15 mm balloon dilator was performed to 15 mm.       The dilation site was examined following endoscope reinsertion and       showed mild improvement in luminal narrowing. Estimated blood loss: none.      The examined esophagus was normal. Impression:           - Normal duodenal bulb and second portion of the                        duodenum.                       - 4 cm hiatal hernia.                       - Non-bleeding erosive gastropathy. Biopsied.                       - Low-grade of narrowing Schatzki ring. Dilated.                       - Normal esophagus. Recommendation:       - Discharge patient to home (with spouse).                       - Resume previous diet today.                       - Continue present medications.                       - Await pathology results.                       - Repeat upper endoscopy PRN for retreatment.                       - Return to my office as previously scheduled.                       - Use a proton pump inhibitor PO BID for 3 months. Procedure Code(s):    ---  Professional ---                       [phone], Esophagogastroduodenoscopy, flexible, transoral;                        with transendoscopic balloon dilation of esophagus                        (less than 30 mm diameter)                       43239, Esophagogastroduodenoscopy, flexible, transoral;                        with biopsy, single or multiple Diagnosis Code(s):    --- Professional ---                       K44.9, Diaphragmatic hernia without obstruction or                        gangrene                       K31.89, Other diseases of stomach and duodenum                       K22.2, Esophageal obstruction                       R13.14, Dysphagia, pharyngoesophageal phase                        K21.9, Gastro-esophageal reflux disease without                        esophagitis CPT copyright 2016 American Medical Association. All rights reserved. The codes documented in this report are preliminary and upon coder review may  be revised to meet current compliance requirements. Dr. Ulyess Mort Lin Landsman MD, MD 05/11/2017 1:59:11 PM This report has been signed electronically. Number of Addenda: 0 Note Initiated On: 05/11/2017 1:32 PM Estimated Blood Loss: Estimated blood loss was minimal.      [facility]

## 2017-05-11 NOTE — Anesthesia Preprocedure Evaluation (Addendum)
Anesthesia Evaluation  Patient identified by MRN, date of birth, ID band Patient awake    Reviewed: Allergy & Precautions, NPO status , Patient's Chart, lab work & pertinent test results  Airway Mallampati: II  TM Distance: <3 FB     Dental  (+) Partial Upper   Pulmonary shortness of breath and with exertion, COPD,  COPD inhaler, former smoker,    Pulmonary exam normal        Cardiovascular hypertension, Pt. on medications Normal cardiovascular exam     Neuro/Psych PSYCHIATRIC DISORDERS Depression    GI/Hepatic Neg liver ROS, GERD  Medicated and Controlled,  Endo/Other  negative endocrine ROS  Renal/GU negative Renal ROS  negative genitourinary   Musculoskeletal  (+) Arthritis , Osteoarthritis,    Abdominal Normal abdominal exam  (+)   Peds negative pediatric ROS (+)  Hematology negative hematology ROS (+)   Anesthesia Other Findings URI resolving... Still with a persistent cough  Reproductive/Obstetrics                            Anesthesia Physical Anesthesia Plan  ASA: III  Anesthesia Plan: General   Post-op Pain Management:    Induction: Intravenous  PONV Risk Score and Plan:   Airway Management Planned: Nasal Cannula  Additional Equipment:   Intra-op Plan:   Post-operative Plan:   Informed Consent: I have reviewed the patients History and Physical, chart, labs and discussed the procedure including the risks, benefits and alternatives for the proposed anesthesia with the patient or authorized representative who has indicated his/her understanding and acceptance.   Dental advisory given  Plan Discussed with: CRNA and Surgeon  Anesthesia Plan Comments:         Anesthesia Quick Evaluation

## 2017-05-11 NOTE — Anesthesia Post-op Follow-up Note (Signed)
Anesthesia QCDR form completed.        

## 2017-05-11 NOTE — Anesthesia Procedure Notes (Signed)
Date/Time: 05/11/2017 1:35 PM Performed by: Johnna Acosta, CRNA Pre-anesthesia Checklist: Patient identified, Emergency Drugs available, Suction available, Patient being monitored and Timeout performed Patient Re-evaluated:Patient Re-evaluated prior to induction Oxygen Delivery Method: Nasal cannula Preoxygenation: Pre-oxygenation with 100% oxygen

## 2017-05-11 NOTE — Anesthesia Postprocedure Evaluation (Signed)
Anesthesia Post Note  Patient: Joyce Rodriguez  Procedure(s) Performed: ESOPHAGOGASTRODUODENOSCOPY (EGD) WITH PROPOFOL (N/A )  Patient location during evaluation: PACU Anesthesia Type: General Level of consciousness: awake and alert and oriented Pain management: pain level controlled Vital Signs Assessment: post-procedure vital signs reviewed and stable Respiratory status: spontaneous breathing Cardiovascular status: blood pressure returned to baseline Anesthetic complications: no     Last Vitals:  Vitals:   05/11/17 1229 05/11/17 1402  BP: 127/83 112/75  Pulse: 90 97  Resp: 18 16  Temp: (!) 36.1 C (!) 36.3 C  SpO2: 94% 97%    Last Pain:  Vitals:   05/11/17 1402  TempSrc: Tympanic                 Makaelah Cranfield

## 2017-05-12 ENCOUNTER — Encounter: Payer: Self-pay | Admitting: Gastroenterology

## 2017-05-13 LAB — SURGICAL PATHOLOGY

## 2017-05-14 NOTE — Progress Notes (Signed)
Patient aware results are normal and will continue on her PPI.  She will call back closer to 3 months to schedule her follow up appt

## 2017-05-18 IMAGING — CR DG CHEST 2V
2 series · 2 of 2 positions shown · non-contrast
Comparison: PA and lateral chest x-ray June 12, 2014

CLINICAL DATA: Several weeks of productive cough. History of COPD,
former smoker, previous episodes of pneumonia.

EXAM:
CHEST  2 VIEW

[chest pa]
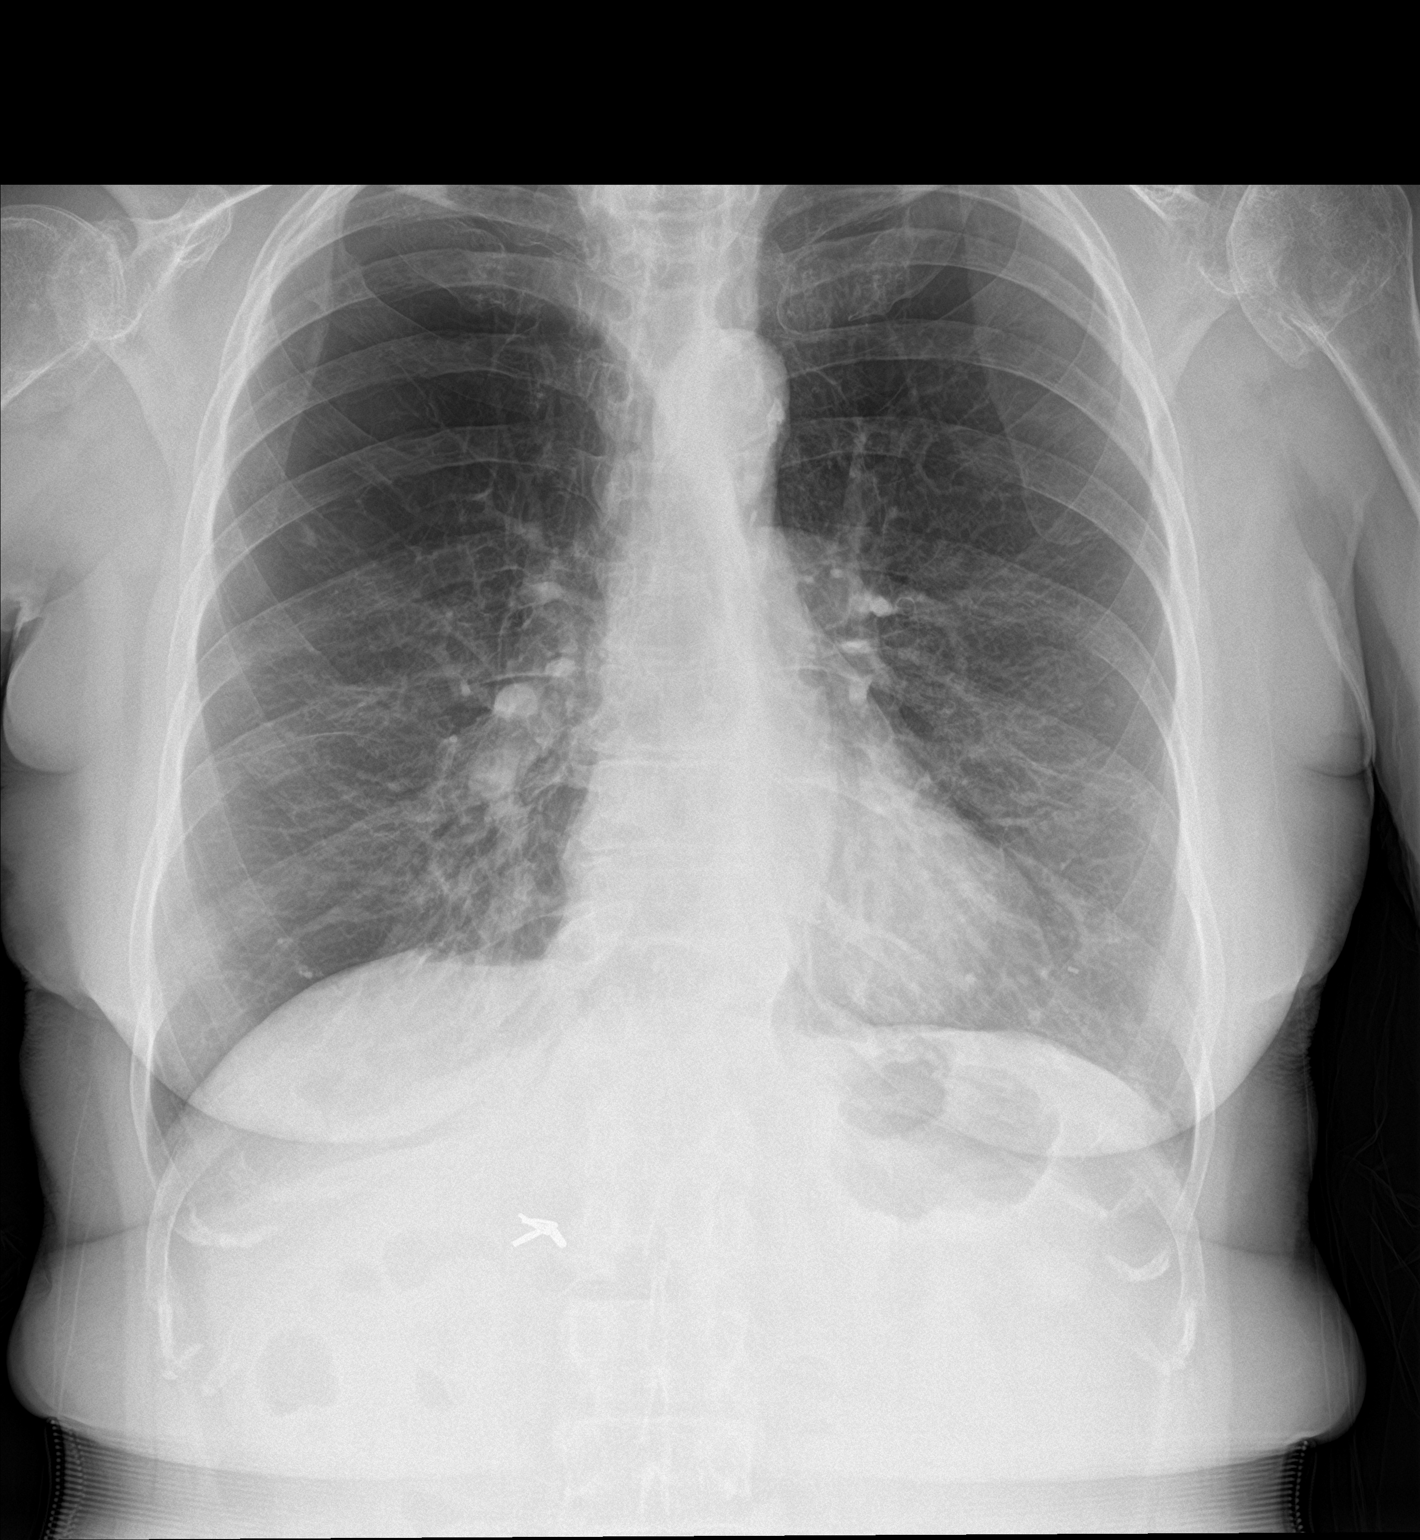

[chest lat]
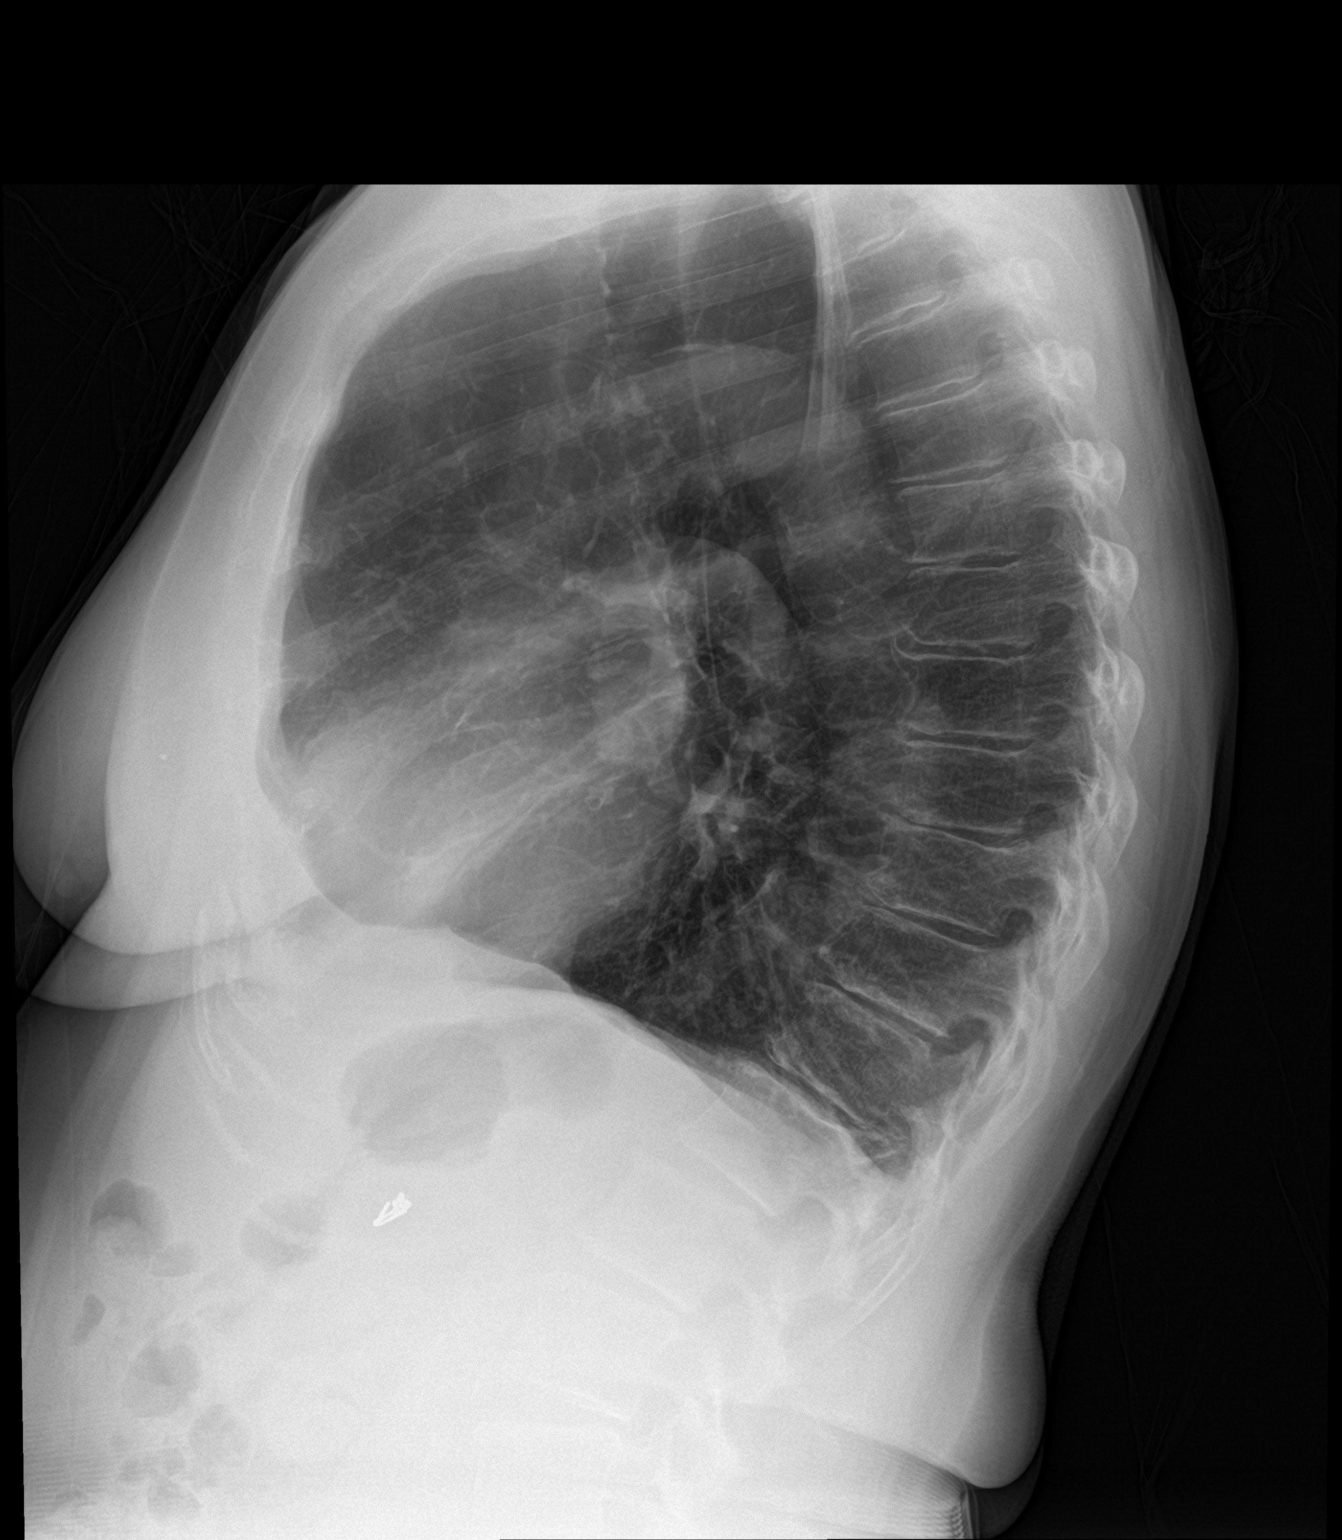

[2 of 2 positions shown; findings below may reference images not displayed]

FINDINGS: The lungs remain mildly hyperinflated. The interstitial markings are
coarse especially in the lower lobes but this is stable. The heart
and pulmonary vascularity are normal. The mediastinum is normal in
width. There is calcification in the wall of the thoracic aorta. The
bony thorax exhibits no acute abnormality. There degenerative
changes of the left shoulder.
IMPRESSION: Chronic bronchitic changes.  No acute pneumonia.

Thoracic aortic atherosclerosis.

## 2017-05-24 ENCOUNTER — Encounter: Payer: Self-pay | Admitting: Internal Medicine

## 2017-05-24 ENCOUNTER — Ambulatory Visit (INDEPENDENT_AMBULATORY_CARE_PROVIDER_SITE_OTHER): Payer: PPO | Admitting: Internal Medicine

## 2017-05-24 VITALS — BP 137/76 | HR 97 | Ht 63.0 in | Wt 139.8 lb

## 2017-05-24 DIAGNOSIS — J9611 Chronic respiratory failure with hypoxia: Secondary | ICD-10-CM | POA: Diagnosis not present

## 2017-05-24 DIAGNOSIS — J449 Chronic obstructive pulmonary disease, unspecified: Secondary | ICD-10-CM

## 2017-05-24 DIAGNOSIS — R0602 Shortness of breath: Secondary | ICD-10-CM

## 2017-05-24 DIAGNOSIS — K219 Gastro-esophageal reflux disease without esophagitis: Secondary | ICD-10-CM | POA: Diagnosis not present

## 2017-05-24 NOTE — Patient Instructions (Signed)

## 2017-05-24 NOTE — Progress Notes (Signed)
[facility] [place], [place] [address]  Pulmonary Sleep Medicine  Office Visit Note  Patient Name: Joyce Rodriguez DOB: [birth year] MRN [id number]  Date of Service: 05/24/2017  Complaints/HPI: doing well overall no admissions. Recently had a bronchitis now resolved. Patient has had a EGD done with hiatal hernia. Patient has no cough noted. Still with some SOB noted on exertion. Patient has no fevers or chills. She states she has had issues with couging up some sputum on occastion. No chest pain is noted  ROS  General: (-) fever, (-) chills, (-) night sweats, (-) weakness Skin: (-) rashes, (-) itching,. Eyes: (-) visual changes, (-) redness, (-) itching. Nose and Sinuses: (-) nasal stuffiness or itchiness, (-) postnasal drip, (-) nosebleeds, (-) sinus trouble. Mouth and Throat: (-) sore throat, (-) hoarseness. Neck: (-) swollen glands, (-) enlarged thyroid, (-) neck pain. Respiratory: - cough, (-) bloody sputum, + shortness of breath, - wheezing. Cardiovascular: - ankle swelling, (-) chest pain. Lymphatic: (-) lymph node enlargement. Neurologic: (-) numbness, (-) tingling. Psychiatric: (-) anxiety, (-) depression   Current Medication: Outpatient Encounter Medications as of 05/24/2017  Medication Sig  . albuterol (PROVENTIL HFA;VENTOLIN HFA) 108 (90 BASE) MCG/ACT inhaler Inhale 2 puffs into the lungs every 6 (six) hours as needed for shortness of breath.  Marland Kitchen aspirin EC 81 MG tablet Take 81 mg by mouth daily.  Marland Kitchen escitalopram (LEXAPRO) 10 MG tablet Take 1 tablet (10 mg total) by mouth daily.  Marland Kitchen esomeprazole (NEXIUM) 40 MG capsule Take 1 capsule (40 mg total) by mouth 2 (two) times daily before a meal.  . Fluticasone-Salmeterol (ADVAIR DISKUS) 250-50 MCG/DOSE AEPB Inhale 1 puff into the lungs 2 (two) times daily.  . hydrochlorothiazide (HYDRODIURIL) 25 MG tablet Take 1 tablet (25 mg total) by mouth daily.  . rosuvastatin (CRESTOR) 20 MG tablet Take 1 tablet (20  mg total) by mouth daily.  Marland Kitchen SPIRIVA HANDIHALER 18 MCG inhalation capsule   . traMADol (ULTRAM) 50 MG tablet Take 1 tablet (50 mg total) by mouth every 12 (twelve) hours as needed. for pain   No facility-administered encounter medications on file as of 05/24/2017.     Surgical History: Past Surgical History:  Procedure Laterality Date  . ABDOMINAL HYSTERECTOMY    . CHOLECYSTECTOMY    . ESOPHAGOGASTRODUODENOSCOPY (EGD) WITH PROPOFOL N/A 05/11/2017   Procedure: ESOPHAGOGASTRODUODENOSCOPY (EGD) WITH PROPOFOL;  Surgeon: Lin Landsman, MD;  Location: [facility];  Service: Gastroenterology;  Laterality: N/A;  . FOOT SURGERY      Medical History: Past Medical History:  Diagnosis Date  . Arthritis   . COPD (chronic obstructive pulmonary disease) ([place])   . Emphysema of lung ([place])   . Hyperlipidemia   . Hypertension   . Osteoporosis     Family History: Family History  Problem Relation Age of Onset  . Ovarian cancer Mother   . Emphysema Father   . Melanoma Sister   . Lung cancer Brother     Social History: Social History   Socioeconomic History  . Marital status: Married    Spouse name: Not on file  . Number of children: Not on file  . Years of education: 12  . Highest education level: 12th grade  Social Needs  . Financial resource strain: Not hard at all  . Food insecurity - worry: Never true  . Food insecurity - inability: Never true  . Transportation needs - medical: No  . Transportation needs - non-medical: No  Occupational History  .  Not on file  Tobacco Use  . Smoking status: Former Smoker    Types: Cigarettes    Last attempt to quit: 03/18/2001    Years since quitting: 16.1  . Smokeless tobacco: Never Used  Substance and Sexual Activity  . Alcohol use: Yes    Comment: rare  . Drug use: No  . Sexual activity: Not on file  Other Topics Concern  . Not on file  Social History Narrative  . Not on file    Vital Signs: Blood pressure 137/76, pulse  97, height _0  (1.6 m), weight 139 lb 12.8 oz (63.4 kg), SpO2 92 %.  Examination: General Appearance: The patient is well-developed, well-nourished, and in no distress. Skin: Gross inspection of skin unremarkable. Head: normocephalic, no gross deformities. Eyes: no gross deformities noted. ENT: ears appear grossly normal no exudates. Neck: Supple. No thyromegaly. No LAD. Respiratory: clear today. Cardiovascular: Normal S1 and S2 without murmur or rub. Extremities: No cyanosis. pulses are equal. Neurologic: Alert and oriented. No involuntary movements.  LABS: Recent Results (from the past 2160 hour(s))  Urinalysis, Routine w reflex microscopic     Status: Abnormal   Collection Time: 04/19/17 10:33 AM  Result Value Ref Range   Specific Gravity, UA 1.010 1.005 - 1.030   pH, UA 5.0 5.0 - 7.5   Color, UA Yellow Yellow   Appearance Ur Hazy (A) Clear   Leukocytes, UA Trace (A) Negative   Protein, UA Negative Negative/Trace   Glucose, UA Negative Negative   Ketones, UA Negative Negative   RBC, UA Negative Negative   Bilirubin, UA Negative Negative   Urobilinogen, Ur 0.2 0.2 - 1.0 mg/dL   Nitrite, UA Negative Negative   Microscopic Examination See below:   Microscopic Examination     Status: Abnormal   Collection Time: 04/19/17 10:33 AM  Result Value Ref Range   WBC, UA 0-5 0 - 5 /hpf   RBC, UA 0-2 0 - 2 /hpf   Epithelial Cells (non renal) 0-10 0 - 10 /hpf   Renal Epithel, UA 0-10 (A) None seen /hpf   Casts Present None seen /lpf   Cast Type Hyaline casts N/A   Mucus, UA Present Not Estab.   Bacteria, UA None seen None seen/Few  TSH     Status: None   Collection Time: 04/19/17 10:36 AM  Result Value Ref Range   TSH 1.330 0.450 - 4.500 uIU/mL  CBC     Status: Abnormal   Collection Time: 04/19/17 10:36 AM  Result Value Ref Range   WBC 10.2 3.4 - 10.8 x10E3/uL   RBC 4.41 3.77 - 5.28 x10E6/uL   Hemoglobin 13.5 11.1 - 15.9 g/dL   Hematocrit 40.7 34.0 - 46.6 %   MCV 92 79 -  97 fL   MCH 30.6 26.6 - 33.0 pg   MCHC 33.2 31.5 - 35.7 g/dL   RDW 14.5 12.3 - 15.4 %   Platelets 418 (H) 150 - 379 x10E3/uL  Comp Met (CMET)     Status: Abnormal   Collection Time: 04/19/17 10:36 AM  Result Value Ref Range   Glucose 88 65 - 99 mg/dL   BUN 15 8 - 27 mg/dL   Creatinine, Ser 0.87 0.57 - 1.00 mg/dL   GFR calc non Af Amer 68 >59 mL/min/1.73   GFR calc Af Amer 78 >59 mL/min/1.73   BUN/Creatinine Ratio 17 12 - 28   Sodium 141 134 - 144 mmol/L   Potassium 3.8 3.5 - 5.2 mmol/L  Chloride 96 96 - 106 mmol/L   CO2 23 20 - 29 mmol/L   Calcium 10.7 (H) 8.7 - 10.3 mg/dL   Total Protein 7.5 6.0 - 8.5 g/dL   Albumin 4.5 3.5 - 4.8 g/dL   Globulin, Total 3.0 1.5 - 4.5 g/dL   Albumin/Globulin Ratio 1.5 1.2 - 2.2   Bilirubin Total <0.2 0.0 - 1.2 mg/dL   Alkaline Phosphatase 62 39 - 117 IU/L   AST 23 0 - 40 IU/L   ALT 17 0 - 32 IU/L  Surgical pathology     Status: None   Collection Time: 05/11/17  1:34 PM  Result Value Ref Range   SURGICAL PATHOLOGY      Surgical Pathology CASE: [id number] PATIENT: Joyce Rodriguez Surgical Pathology Report     SPECIMEN SUBMITTED: A. Stomach; cbx  CLINICAL HISTORY: None provided  PRE-OPERATIVE DIAGNOSIS: Dysphagia  POST-OPERATIVE DIAGNOSIS: Gastric erosion, hiatal hernia, Schatzki ring     DIAGNOSIS: A.  STOMACH; COLD BIOPSY: - ANTRAL MUCOSA WITH MILD CHRONIC ACTIVE GASTRITIS. - OXYNTIC MUCOSA WITH MILD CHRONIC GASTRITIS. - NEGATIVE FOR H. PYLORI, DYSPLASIA AND MALIGNANCY.   GROSS DESCRIPTION:  A. Labeled: gastric C BX  Tissue fragment(s): 4  Size: 0.3- 0.4 cm  Description: in formalin, tan-brown fragments  Entirely submitted in one cassette(s).          Final Diagnosis performed by Delorse Lek, MD.  Electronically signed 05/13/2017 2:25:25PM    The electronic signature indicates that the named Attending Pathologist has evaluated the specimen  Technical component performed at [facility], [address], [place], [place] [address] Lab: [phone] Dir:  Rush Farmer, MD, MMM  Professional component performed at [facility], [facility], [place], [place], [place] [address] Lab: [phone] Dir: Dellia Nims. Reuel Derby, MD      Radiology: No results found.  No results found.  No results found.    Assessment and Plan: Patient Active Problem List   Diagnosis Date Noted  . Schatzki's ring   . Gastric erosion   . Advanced care planning/counseling discussion 04/21/2017  . Acute on chronic respiratory failure with hypoxia ([place]) 05/08/2016  . COPD exacerbation ([place]) 05/08/2016  . Hyperglycemia 05/08/2016  . Dyspnea 05/06/2016  . Encounter for hepatitis C screening test for low risk patient 03/19/2016  . Essential hypertension 03/19/2015  . COPD (chronic obstructive pulmonary disease) ([place]) 03/19/2015  . Hyperlipidemia 03/19/2015  . Osteoarthritis of both hands 03/19/2015  . Osteoporosis 03/19/2015  . Acid reflux 03/19/2015  . Major depression, chronic 09/12/2014    1. COPD severe disease will continue with present therapy on her inhalers 2. GERD stable will follow along sees GI 3. Chronic respiratory Failure with hypoxia on oxygen therapy as prescribed 4. SOB will get follow up PFT  General Counseling: I have discussed the findings of the evaluation and examination with Joyce Rodriguez.  I have also discussed any further diagnostic evaluation thatmay be needed or ordered today. Leiyah verbalizes understanding of the findings of todays visit. We also reviewed her medications today and discussed drug interactions and side effects including but not limited excessive drowsiness and altered mental states. We also discussed that there is always a risk not just to her but also people around her. she has been encouraged to call the office with any questions or concerns that should arise related to todays visit.    Time spent: 21mn  I have personally obtained a history,  examined the patient, evaluated laboratory and imaging results, formulated the assessment and plan  and placed orders.    Allyne Gee, MD [facility] Pulmonary and Critical Care Sleep medicine

## 2017-06-09 ENCOUNTER — Ambulatory Visit: Payer: PPO | Admitting: Internal Medicine

## 2017-06-09 DIAGNOSIS — R0602 Shortness of breath: Secondary | ICD-10-CM | POA: Diagnosis not present

## 2017-06-17 ENCOUNTER — Encounter: Payer: Self-pay | Admitting: Internal Medicine

## 2017-06-17 NOTE — Progress Notes (Signed)
[place], [address]  DATE OF SERVICE:  June 09, 2017  Complete Pulmonary Function Testing Interpretation:  FINDINGS:   forced vital capacity is moderately decreased FEV1 is 0.71 L which is 33% of predicted and is severely decreased.  FEV1 FVC ratio is severely decreased.  Post bronchodilator there is no significant improvement in the FEV1 however clinical improvement may occur in the absence of spirometric improvement and should not preclude the use of bronchodilators.  Total lung capacity is increased based on body box residual volume is increased residual volume total lung capacity ratio is increased FRC is increase.  DLCO is severely reduced  IMPRESSION:   this pulmonary function study is consistent with severe obstructive lung disease.  DLCO is severely reduced.  There is high fair inflation noted.  There is no significant response to bronchodilators however clinically improvement may still occur in the absence of spirometric improvement.  Allyne Gee, MD [facility] Pulmonary Critical Care Medicine Sleep Medicine

## 2017-07-05 ENCOUNTER — Telehealth: Payer: Self-pay

## 2017-07-06 NOTE — Telephone Encounter (Signed)
Sent message to Tat

## 2017-07-08 ENCOUNTER — Telehealth: Payer: Self-pay

## 2017-07-08 NOTE — Telephone Encounter (Signed)
Patient has been advised that her letter for jury duty is ready for pickup.titania

## 2017-07-31 DIAGNOSIS — J441 Chronic obstructive pulmonary disease with (acute) exacerbation: Secondary | ICD-10-CM | POA: Diagnosis not present

## 2017-08-02 DIAGNOSIS — J441 Chronic obstructive pulmonary disease with (acute) exacerbation: Secondary | ICD-10-CM | POA: Diagnosis not present

## 2017-08-04 ENCOUNTER — Other Ambulatory Visit: Payer: Self-pay | Admitting: Internal Medicine

## 2017-08-09 ENCOUNTER — Other Ambulatory Visit: Payer: Self-pay

## 2017-08-09 ENCOUNTER — Encounter: Payer: Self-pay | Admitting: Gastroenterology

## 2017-08-09 ENCOUNTER — Ambulatory Visit (INDEPENDENT_AMBULATORY_CARE_PROVIDER_SITE_OTHER): Payer: PPO | Admitting: Gastroenterology

## 2017-08-09 ENCOUNTER — Encounter (INDEPENDENT_AMBULATORY_CARE_PROVIDER_SITE_OTHER): Payer: Self-pay

## 2017-08-09 VITALS — BP 113/73 | HR 102 | Ht 63.0 in | Wt 140.8 lb

## 2017-08-09 DIAGNOSIS — K219 Gastro-esophageal reflux disease without esophagitis: Secondary | ICD-10-CM | POA: Diagnosis not present

## 2017-08-09 DIAGNOSIS — K449 Diaphragmatic hernia without obstruction or gangrene: Secondary | ICD-10-CM

## 2017-08-09 DIAGNOSIS — K222 Esophageal obstruction: Secondary | ICD-10-CM

## 2017-08-09 NOTE — Progress Notes (Addendum)
Cephas Darby, MD [address]  [place]  [place], [place] [address]  Main: [phone]  Fax: [phone]    Gastroenterology Consultation  Referring Provider:     Guadalupe Maple, MD Primary Care Physician:  Guadalupe Maple, MD Primary Gastroenterologist:  Dr. Cephas Darby Reason for Conandsultation:     Dysphagia, GERD        HPI:   Joyce Rodriguez is a 72 y.o. female referred by Dr. Guadalupe Maple, MD  for consultation & management of chronic GERD. Patient reports that she has been dealing with heartburn for several years and has been on Protonix. Lately, her symptoms have gotten worse associated with difficulty swallowing particularly bread, regurgitation, nocturnal heartburn. Dr. Jeananne Rama switch from Protonix to Nexium 40 mg daily and she reports that her symptoms are significantly better. She otherwise denies any GI symptoms to date.  Follow-up visit 08/09/2017 Since last visit, patient underwent EGD which revealed mild Schatzki's ring that was dilated, large hiatal hernia and erosions in the hernia sac. I increased Nexium to 2 times daily. Patient reports minimal dysphagia and denies any heartburn. She is concerned about co-pay $90 per month for Nexium and wants to know if this is her long-term medication to control acid reflux  NSAIDs: None  Antiplts/Anticoagulants/Anti thrombotics: None  GI Procedures:  EGD 05/11/2017 - Normal duodenal bulb and second portion of the duodenum. - 4 cm hiatal hernia. - Non-bleeding erosive gastropathy. Biopsied. - Low-grade of narrowing Schatzki ring. Dilated. - Normal esophagus. DIAGNOSIS:  A. STOMACH; COLD BIOPSY:  - ANTRAL MUCOSA WITH MILD CHRONIC ACTIVE GASTRITIS.  - OXYNTIC MUCOSA WITH MILD CHRONIC GASTRITIS.  - NEGATIVE FOR H. PYLORI, DYSPLASIA AND MALIGNANCY. She reports having had a colonoscopy a few years ago and is not due for one yet.  Past Medical History:  Diagnosis Date  . Arthritis   . COPD (chronic  obstructive pulmonary disease) ([place])   . Emphysema of lung ([place])   . Hyperlipidemia   . Hypertension   . Osteoporosis     Past Surgical History:  Procedure Laterality Date  . ABDOMINAL HYSTERECTOMY    . CHOLECYSTECTOMY    . ESOPHAGOGASTRODUODENOSCOPY (EGD) WITH PROPOFOL N/A 05/11/2017   Procedure: ESOPHAGOGASTRODUODENOSCOPY (EGD) WITH PROPOFOL;  Surgeon: Lin Landsman, MD;  Location: [facility];  Service: Gastroenterology;  Laterality: N/A;  . FOOT SURGERY       Current Outpatient Medications:  .  aspirin EC 81 MG tablet, Take 81 mg by mouth daily., Disp: , Rfl:  .  escitalopram (LEXAPRO) 10 MG tablet, Take 1 tablet (10 mg total) by mouth daily., Disp: 90 tablet, Rfl: 4 .  esomeprazole (NEXIUM) 40 MG capsule, Take 1 capsule (40 mg total) by mouth 2 (two) times daily before a meal., Disp: 180 capsule, Rfl: 0 .  Fluticasone-Salmeterol (ADVAIR DISKUS) 250-50 MCG/DOSE AEPB, Inhale 1 puff into the lungs 2 (two) times daily., Disp: , Rfl:  .  hydrochlorothiazide (HYDRODIURIL) 25 MG tablet, Take 1 tablet (25 mg total) by mouth daily., Disp: 90 tablet, Rfl: 4 .  PROAIR HFA 108 (90 Base) MCG/ACT inhaler, INHALE 2 PUFFS BY MOUTH EVERY 6 HOURS AS NEEDED, Disp: 76.5 g, Rfl: 0 .  rosuvastatin (CRESTOR) 20 MG tablet, Take 1 tablet (20 mg total) by mouth daily., Disp: 90 tablet, Rfl: 4 .  SPIRIVA HANDIHALER 18 MCG inhalation capsule, , Disp: , Rfl:  .  traMADol (ULTRAM) 50 MG tablet, Take 1 tablet (50 mg total) by mouth every 12 (  twelve) hours as needed. for pain, Disp: 100 tablet, Rfl: 0 Family History  Problem Relation Age of Onset  . Ovarian cancer Mother   . Emphysema Father   . Melanoma Sister   . Lung cancer Brother      Social History   Tobacco Use  . Smoking status: Former Smoker    Types: Cigarettes    Last attempt to quit: 03/18/2001    Years since quitting: 16.4  . Smokeless tobacco: Never Used  Substance Use Topics  . Alcohol use: Yes    Comment: rare  . Drug use:  No    Allergies as of 08/09/2017 - Review Complete 08/09/2017  Allergen Reaction Noted  . Codeine Nausea And Vomiting 07/14/2014    Review of Systems:    All systems reviewed and negative except where noted in HPI.   Physical Exam:  BP 113/73   Pulse (!) 102   Ht 5\' 3"  (1.6 m)   Wt 140 lb 12.8 oz (63.9 kg)   BMI 24.94 kg/m  No LMP recorded. Patient has had a hysterectomy.  General:   Alert,  Well-developed, well-nourished, pleasant and cooperative in NAD Head:  Normocephalic and atraumatic. Eyes:  Sclera clear, no icterus.   Conjunctiva pink. Ears:  Normal auditory acuity. Nose:  No deformity, discharge, or lesions. Mouth:  No deformity or lesions,oropharynx pink & moist. Neck:  Supple; no masses or thyromegaly. Lungs:  Respirations even and unlabored.  Clear throughout to auscultation.   No wheezes, crackles, or rhonchi. No acute distress. Heart:  Regular rate and rhythm; no murmurs, clicks, rubs, or gallops. Abdomen:  Normal bowel sounds. Soft, non-tender and non-distended without masses, hepatosplenomegaly or hernias noted.  No guarding or rebound tenderness.   Rectal: Not performed Msk:  Symmetrical without gross deformities. Good, equal movement & strength bilaterally. Pulses:  Normal pulses noted. Extremities:  No clubbing or edema.  No cyanosis. Neurologic:  Alert and oriented x3;  grossly normal neurologically. Skin:  Intact without significant lesions or rashes. No jaundice. Psych:  Alert and cooperative. Normal mood and affect.  Imaging Studies: Reviewed  Assessment and Plan:   Joyce Rodriguez is a 72 y.o. female with history of COPD, chronic GERD with worsening of GERD symptoms as well as dysphagia. Her symptoms are currently under control on Nexium 40 mg BID. EGD revealed mild obstructing Schatzki's ring, status post dilation, large hiatal hernia with erosions. Currently, asymptomatic  - recommended to decrease Nexium to 40 mg daily - refer to [facility]  surgical associates, Dr. Dahlia Byes to discuss about hernia repair   Follow up in 3 months   Cephas Darby, MD

## 2017-08-30 DIAGNOSIS — J441 Chronic obstructive pulmonary disease with (acute) exacerbation: Secondary | ICD-10-CM | POA: Diagnosis not present

## 2017-09-01 ENCOUNTER — Ambulatory Visit: Payer: PPO | Admitting: Surgery

## 2017-09-01 DIAGNOSIS — J441 Chronic obstructive pulmonary disease with (acute) exacerbation: Secondary | ICD-10-CM | POA: Diagnosis not present

## 2017-09-02 ENCOUNTER — Ambulatory Visit: Payer: PPO | Admitting: Surgery

## 2017-09-03 ENCOUNTER — Encounter: Payer: Self-pay | Admitting: Surgery

## 2017-09-03 ENCOUNTER — Ambulatory Visit (INDEPENDENT_AMBULATORY_CARE_PROVIDER_SITE_OTHER): Payer: PPO | Admitting: Surgery

## 2017-09-03 VITALS — BP 147/82 | HR 103 | Ht 63.0 in | Wt 140.0 lb

## 2017-09-03 DIAGNOSIS — K449 Diaphragmatic hernia without obstruction or gangrene: Secondary | ICD-10-CM

## 2017-09-03 NOTE — Patient Instructions (Signed)
Return in three weeks.. 

## 2017-09-07 ENCOUNTER — Encounter: Payer: Self-pay | Admitting: Surgery

## 2017-09-07 NOTE — Progress Notes (Signed)
Surgical Consultation  09/07/2017  Joyce Rodriguez is an 72 y.o. female.   Chief Complaint  Patient presents with  . Other    Hiatal hernia   HPI: Seen in consultation at the request of Dr. Marius Ditch for intractable reflux and a hiatal hernia.  She reports that she has had reflux for the last several years and has been on Protonix but over the last couple months her symptoms have exacerbated.  She does have some dysphagia for bread and has some nocturnal regurgitation and heartburn.  The reflux symptoms get worse when she lays supine.  They do get some improvement with Nexium.  [place] did an EGD that I have personally reviewed 4 weeks ago showing evidence of a Schatzki ring that was dilated and a large hiatal hernia, erosion components.  No evidence of Barrett's. She has had no evidence of food impactions. Of note she does have some chronic lung disease and is on home oxygen.  She reports that she wears home oxygen at night and if she has to walk. Despite having significant COPD she is pretty functional and is able to walk and shop and she does activities of daily living. Does have significant surgical history for cholecystectomy and hysterectomy. Also reviewed personally her chest x-ray with no active disease and her latest CBC was normal.   Past Medical History:  Diagnosis Date  . Arthritis   . COPD (chronic obstructive pulmonary disease) ([place])   . Emphysema of lung ([place])   . Hyperlipidemia   . Hypertension   . Osteoporosis     Past Surgical History:  Procedure Laterality Date  . ABDOMINAL HYSTERECTOMY    . CHOLECYSTECTOMY    . ESOPHAGOGASTRODUODENOSCOPY (EGD) WITH PROPOFOL N/A 05/11/2017   Procedure: ESOPHAGOGASTRODUODENOSCOPY (EGD) WITH PROPOFOL;  Surgeon: Lin Landsman, MD;  Location: [place];  Service: Gastroenterology;  Laterality: N/A;  . FOOT SURGERY      Family History  Problem Relation Age of Onset  . Ovarian cancer Mother   . Emphysema Father   . Melanoma  Sister   . Lung cancer Brother     Social History:  reports that she quit smoking about 16 years ago. Her smoking use included cigarettes. She has never used smokeless tobacco. She reports that she drinks alcohol. She reports that she does not use drugs.  Allergies:  Allergies  Allergen Reactions  . Codeine Nausea And Vomiting    Pt states that she can tolerate Tramadol fine.    Medications reviewed.     ROS Full ROS performed and is otherwise negative other than what is stated in the HPI    BP (!) 147/82   Pulse (!) 103   Ht 5\' 3"  (1.6 m)   Wt 63.5 kg (140 lb)   BMI 24.80 kg/m   Physical Exam  Constitutional: She is oriented to person, place, and time. She appears well-developed and well-nourished. No distress.  HENT:  Head: Normocephalic and atraumatic.  Neck: Normal range of motion. Neck supple. No JVD present. No tracheal deviation present. No thyromegaly present.  Cardiovascular: Normal rate, regular rhythm and normal heart sounds.  No murmur heard. Pulmonary/Chest: Effort normal and breath sounds normal. No stridor. No respiratory distress. She has no wheezes. She has no rales.  Abdominal: Soft. Bowel sounds are normal. She exhibits no distension and no mass. There is no tenderness. There is no guarding.  Musculoskeletal: Normal range of motion. She exhibits no edema.  Neurological: She is alert and oriented to person, place,  and time. She displays normal reflexes. No cranial nerve deficit or sensory deficit. She exhibits normal muscle tone. Coordination normal.  Skin: Skin is warm and dry. Capillary refill takes less than 2 seconds. She is not diaphoretic.  Psychiatric: She has a normal mood and affect. Her behavior is normal. Judgment and thought content normal.  Nursing note and vitals reviewed.     Assessment/Plan: SYMPTOMATIC  hiatal hernia with reflux symptoms and some mild dysphasia.  Patient is very interested in antireflux surgery.  Discussed with the  patient in detail about antireflux procedure, expectations and outcomes.  We will start work-up including manometry, barium swallow.  She will also need pulmonary optimization/clearance from her pulmonary doctor. Gust with the patient in detail about her disease process.  I will see her back in about 3 to 4 weeks once her work-up has been completed. Stents if work-up provided to the patient and the family.   Caroleen Hamman, MD [facility] General Surgeon

## 2017-09-08 ENCOUNTER — Telehealth: Payer: Self-pay | Admitting: Gastroenterology

## 2017-09-08 ENCOUNTER — Telehealth: Payer: Self-pay | Admitting: Surgery

## 2017-09-08 ENCOUNTER — Ambulatory Visit
Admission: RE | Admit: 2017-09-08 | Discharge: 2017-09-08 | Disposition: A | Discharge status: Home / Self Care | Payer: PPO | Source: Ambulatory Visit | Attending: Surgery | Admitting: Surgery

## 2017-09-08 DIAGNOSIS — M6289 Other specified disorders of muscle: Secondary | ICD-10-CM | POA: Insufficient documentation

## 2017-09-08 DIAGNOSIS — K449 Diaphragmatic hernia without obstruction or gangrene: Secondary | ICD-10-CM

## 2017-09-08 DIAGNOSIS — K219 Gastro-esophageal reflux disease without esophagitis: Secondary | ICD-10-CM | POA: Diagnosis not present

## 2017-09-08 NOTE — Telephone Encounter (Signed)
I have called patient back. Patient was informed that the referral has been sent to [facility] gastroenterology. I have spoke with the office and the chart is being reviewed at this time.   Patient also wanted to follow up and make sure that a medical clearance form was sent to her pulmonologist. I did not see a note in the chart stating that a clearance was sent. Please follow up. Patient will also need a follow up with Dr Dahlia Byes. I advised her that as soon as we get her Manometry test scheduled we will then schedule her to follow up with Dr Dahlia Byes to discuss surgery.   Patient was understanding of all information and was advised that we will follow up with her with all information.

## 2017-09-08 NOTE — Telephone Encounter (Signed)
Patient called said she didn't receive a phone call from [place] yet on her referral. Please call patient and advise.

## 2017-09-09 NOTE — Telephone Encounter (Signed)
Pulmonary clearance was faxed on 09/03/2017 to Dr. Humphrey Rolls, her pulmonologist. Awaiting for response.

## 2017-09-09 NOTE — Telephone Encounter (Signed)
Left message to call back. Will need to decide on a date and time.

## 2017-09-09 NOTE — Telephone Encounter (Signed)
We have received patients pulmonary clearance. Clearance has been placed in patients chart.

## 2017-09-09 NOTE — Telephone Encounter (Signed)
We are now awaiting on [facility] to give [place] an appointment for her Manometry.

## 2017-09-10 NOTE — Telephone Encounter (Signed)
Manometry is scheduled to be done on 09/13/2017.

## 2017-09-10 NOTE — Telephone Encounter (Signed)
Spoke with the patient. She agrees to arrive to [facility] at 8:15 am on 09/13/17.

## 2017-09-13 ENCOUNTER — Encounter (HOSPITAL_COMMUNITY)
Admission: RE | Disposition: A | Discharge status: Home / Self Care | Payer: Self-pay | Source: Ambulatory Visit | Attending: Gastroenterology

## 2017-09-13 ENCOUNTER — Ambulatory Visit (HOSPITAL_COMMUNITY)
Admission: RE | Admit: 2017-09-13 | Discharge: 2017-09-13 | Disposition: A | Discharge status: Home / Self Care | Payer: PPO | Source: Ambulatory Visit | Attending: Gastroenterology | Admitting: Gastroenterology

## 2017-09-13 DIAGNOSIS — K219 Gastro-esophageal reflux disease without esophagitis: Secondary | ICD-10-CM | POA: Diagnosis not present

## 2017-09-13 DIAGNOSIS — K449 Diaphragmatic hernia without obstruction or gangrene: Secondary | ICD-10-CM | POA: Diagnosis not present

## 2017-09-13 DIAGNOSIS — R1310 Dysphagia, unspecified: Secondary | ICD-10-CM | POA: Diagnosis not present

## 2017-09-13 DIAGNOSIS — K228 Other specified diseases of esophagus: Secondary | ICD-10-CM | POA: Insufficient documentation

## 2017-09-13 HISTORY — PX: ESOPHAGEAL MANOMETRY: SHX5429

## 2017-09-13 SURGERY — MANOMETRY, ESOPHAGUS

## 2017-09-13 MED ORDER — LIDOCAINE VISCOUS HCL 2 % MT SOLN
OROMUCOSAL | Status: AC
  Filled 2017-09-13: qty 15

## 2017-09-13 SURGICAL SUPPLY — 2 items
FACESHIELD LNG OPTICON STERILE (SAFETY) IMPLANT
GLOVE BIO SURGEON STRL SZ8 (GLOVE) ×6 IMPLANT

## 2017-09-13 NOTE — Progress Notes (Signed)
Esophageal Manometry performed per protocol.  Patient tolerated procedure without complications.  Results to be interpreted by Dr. Silverio Decamp.

## 2017-09-14 ENCOUNTER — Encounter (HOSPITAL_COMMUNITY): Payer: Self-pay | Admitting: Gastroenterology

## 2017-09-20 ENCOUNTER — Ambulatory Visit: Payer: Self-pay | Admitting: Surgery

## 2017-09-23 ENCOUNTER — Encounter: Payer: Self-pay | Admitting: Internal Medicine

## 2017-09-23 ENCOUNTER — Other Ambulatory Visit: Payer: Self-pay | Admitting: Internal Medicine

## 2017-09-23 ENCOUNTER — Ambulatory Visit (INDEPENDENT_AMBULATORY_CARE_PROVIDER_SITE_OTHER): Payer: PPO | Admitting: Internal Medicine

## 2017-09-23 ENCOUNTER — Ambulatory Visit
Admission: RE | Admit: 2017-09-23 | Discharge: 2017-09-23 | Disposition: A | Discharge status: Home / Self Care | Payer: PPO | Source: Ambulatory Visit | Attending: Internal Medicine | Admitting: Internal Medicine

## 2017-09-23 ENCOUNTER — Inpatient Hospital Stay: Admit: 2017-09-23 | Payer: Self-pay

## 2017-09-23 ENCOUNTER — Ambulatory Visit: Payer: PPO | Attending: Internal Medicine

## 2017-09-23 ENCOUNTER — Telehealth: Payer: Self-pay

## 2017-09-23 VITALS — BP 118/72 | HR 99 | Resp 16 | Ht 63.0 in | Wt 142.6 lb

## 2017-09-23 DIAGNOSIS — Z9981 Dependence on supplemental oxygen: Secondary | ICD-10-CM

## 2017-09-23 DIAGNOSIS — J449 Chronic obstructive pulmonary disease, unspecified: Secondary | ICD-10-CM | POA: Diagnosis not present

## 2017-09-23 DIAGNOSIS — Z01812 Encounter for preprocedural laboratory examination: Secondary | ICD-10-CM | POA: Diagnosis not present

## 2017-09-23 DIAGNOSIS — R0902 Hypoxemia: Secondary | ICD-10-CM

## 2017-09-23 DIAGNOSIS — R0602 Shortness of breath: Secondary | ICD-10-CM

## 2017-09-23 DIAGNOSIS — J9611 Chronic respiratory failure with hypoxia: Secondary | ICD-10-CM | POA: Diagnosis not present

## 2017-09-23 LAB — BLOOD GAS, ARTERIAL
Acid-Base Excess: 5.2 mmol/L — ABNORMAL HIGH (ref 0.0–2.0)
BICARBONATE: 29.9 mmol/L — AB (ref 20.0–28.0)
FIO2: 0.21
O2 Saturation: 92.5 %
PATIENT TEMPERATURE: 37
PCO2 ART: 43 mmHg (ref 32.0–48.0)
PO2 ART: 62 mmHg — AB (ref 83.0–108.0)
pH, Arterial: 7.45 (ref 7.350–7.450)

## 2017-09-23 NOTE — Patient Instructions (Signed)

## 2017-09-23 NOTE — Telephone Encounter (Signed)
Faxed surgery clearance form back to [facility] and put in scan. Beth

## 2017-09-23 NOTE — Progress Notes (Signed)
[facility] [place], [place] [address]  Pulmonary Sleep Medicine   Office Visit Note  Patient Name: Joyce Rodriguez DOB: 1946-01-18 MRN [id number]  Date of Service: 09/23/2017  Complaints/HPI:  Patient is here for evaluation of preop.  She has severe COPD with an FEV1 of 0.8 L she is oxygen dependent.  The patient has been scheduled for hiatal hernia surgery.  She has severe symptoms associated with the hiatal hernia which make it difficult for her to lay flat at nighttime.  She cannot sleep at nighttime.  She takes Nexium without any relief.  The patient and I had a lengthy discussion regarding the potential complications including but not limited to failure to come off the ventilator prolonged mechanical ventilation tracheostomy and also the possibility death.  She does understand that these risks are inherent with any procedure she also understands that these respiratory increased with her her particular situation.  She states that she is going to discuss it with her family and her husband in more detail regarding the ability to go through with surgery.  ROS  General: (-) fever, (-) chills, (-) night sweats, (-) weakness Skin: (-) rashes, (-) itching,. Eyes: (-) visual changes, (-) redness, (-) itching. Nose and Sinuses: (-) nasal stuffiness or itchiness, (-) postnasal drip, (-) nosebleeds, (-) sinus trouble. Mouth and Throat: (-) sore throat, (-) hoarseness. Neck: (-) swollen glands, (-) enlarged thyroid, (-) neck pain. Respiratory: + cough, (-) bloody sputum, + shortness of breath, + wheezing. Cardiovascular: - ankle swelling, (-) chest pain. Lymphatic: (-) lymph node enlargement. Neurologic: (-) numbness, (-) tingling. Psychiatric: (-) anxiety, (-) depression   Current Medication: Outpatient Encounter Medications as of 09/23/2017  Medication Sig  . aspirin EC 81 MG tablet Take 81 mg by mouth daily.  Marland Kitchen escitalopram (LEXAPRO) 10 MG tablet Take 1 tablet (10  mg total) by mouth daily.  . Fluticasone-Salmeterol (ADVAIR DISKUS) 250-50 MCG/DOSE AEPB Inhale 1 puff into the lungs 2 (two) times daily.  . hydrochlorothiazide (HYDRODIURIL) 25 MG tablet Take 1 tablet (25 mg total) by mouth daily.  . OXYGEN Inhale into the lungs.  Marland Kitchen PROAIR HFA 108 (90 Base) MCG/ACT inhaler INHALE 2 PUFFS BY MOUTH EVERY 6 HOURS AS NEEDED  . rosuvastatin (CRESTOR) 20 MG tablet Take 1 tablet (20 mg total) by mouth daily.  Marland Kitchen SPIRIVA HANDIHALER 18 MCG inhalation capsule   . traMADol (ULTRAM) 50 MG tablet Take 1 tablet (50 mg total) by mouth every 12 (twelve) hours as needed. for pain  . esomeprazole (NEXIUM) 40 MG capsule Take 1 capsule (40 mg total) by mouth 2 (two) times daily before a meal.   No facility-administered encounter medications on file as of 09/23/2017.     Surgical History: Past Surgical History:  Procedure Laterality Date  . ABDOMINAL HYSTERECTOMY    . CHOLECYSTECTOMY    . ESOPHAGEAL MANOMETRY N/A 09/13/2017   Procedure: ESOPHAGEAL MANOMETRY (EM);  Surgeon: Mauri Pole, MD;  Location: [facility] ENDOSCOPY;  Service: Endoscopy;  Laterality: N/A;  . ESOPHAGOGASTRODUODENOSCOPY (EGD) WITH PROPOFOL N/A 05/11/2017   Procedure: ESOPHAGOGASTRODUODENOSCOPY (EGD) WITH PROPOFOL;  Surgeon: Lin Landsman, MD;  Location: [facility] ENDOSCOPY;  Service: Gastroenterology;  Laterality: N/A;  . FOOT SURGERY      Medical History: Past Medical History:  Diagnosis Date  . Arthritis   . COPD (chronic obstructive pulmonary disease) ([place])   . Emphysema of lung ([place])   . Hyperlipidemia   . Hypertension   . Osteoporosis     Family  History: Family History  Problem Relation Age of Onset  . Ovarian cancer Mother   . Emphysema Father   . Melanoma Sister   . Lung cancer Brother     Social History: Social History   Socioeconomic History  . Marital status: Married    Spouse name: Not on file  . Number of children: Not on file  . Years of education: 76  . Highest  education level: 12th grade  Occupational History  . Not on file  Social Needs  . Financial resource strain: Not hard at all  . Food insecurity:    Worry: Never true    Inability: Never true  . Transportation needs:    Medical: No    Non-medical: No  Tobacco Use  . Smoking status: Former Smoker    Types: Cigarettes    Last attempt to quit: 03/18/2001    Years since quitting: 16.5  . Smokeless tobacco: Never Used  Substance and Sexual Activity  . Alcohol use: Yes    Comment: rare  . Drug use: No  . Sexual activity: Not on file  Lifestyle  . Physical activity:    Days per week: 0 days    Minutes per session: 0 min  . Stress: Not at all  Relationships  . Social connections:    Talks on phone: More than three times a week    Gets together: More than three times a week    Attends religious service: More than 4 times per year    Active member of club or organization: No    Attends meetings of clubs or organizations: Never    Relationship status: Married  . Intimate partner violence:    Fear of current or ex partner: No    Emotionally abused: No    Physically abused: No    Forced sexual activity: No  Other Topics Concern  . Not on file  Social History Narrative  . Not on file    Vital Signs: Blood pressure 118/72, pulse 99, resp. rate 16, height 5\' 3"  (1.6 m), weight 142 lb 9.6 oz (64.7 kg), SpO2 91 %.  Examination: General Appearance: The patient is well-developed, well-nourished, and in no distress. Skin: Gross inspection of skin unremarkable. Head: normocephalic, no gross deformities. Eyes: no gross deformities noted. ENT: ears appear grossly normal no exudates. Neck: Supple. No thyromegaly. No LAD. Respiratory: no rhonchi noted. Cardiovascular: Normal S1 and S2 without murmur or rub. Extremities: No cyanosis. pulses are equal. Neurologic: Alert and oriented. No involuntary movements.  LABS: No results found for this or any previous visit (from the past 2160  hour(s)).  Radiology: No results found.  No results found.  Dg Esophagus  Result Date: 09/08/2017 CLINICAL DATA:  Dysphagia and odynophagia EXAM: ESOPHOGRAM / BARIUM SWALLOW / BARIUM TABLET STUDY TECHNIQUE: Combined double contrast and single contrast examination performed using thick liquid barium and thin barium liquid. The patient was observed with fluoroscopy swallowing a 13 mm barium sulphate tablet. FLUOROSCOPY TIME:  Fluoroscopy Time: 1 minutes 12 seconds Radiation Exposure Index (if provided by the fluoroscopic device): 35.00 mGy Number of Acquired Spot Images: 17 COMPARISON:  None. FINDINGS: Swallowing and peristalsis appear within normal limits. There is a fairly small focal hiatal hernia with reflux of an incomplete column of barium to the upper esophagus which empties slowly. There is no esophageal stricture, mass, or ulceration. Pharynx appears normal except for apparent hypertrophy of the cricopharyngeus muscle. A 13 mm barium tablet passed freely from the mouth to  the stomach without appreciable hesitation. IMPRESSION: Small hiatal hernia with moderate spontaneous gastroesophageal reflux. No esophageal stricture. No mass or ulceration. There is a degree of cricopharyngeus muscle hypertrophy. Electronically Signed   By: Lowella Grip III M.D.   On: 09/08/2017 11:11      Assessment and Plan: Patient Active Problem List   Diagnosis Date Noted  . Schatzki's ring   . Gastric erosion   . Advanced care planning/counseling discussion 04/21/2017  . Acute on chronic respiratory failure with hypoxia ([place]) 05/08/2016  . COPD exacerbation ([place]) 05/08/2016  . Hyperglycemia 05/08/2016  . Dyspnea 05/06/2016  . Encounter for hepatitis C screening test for low risk patient 03/19/2016  . Essential hypertension 03/19/2015  . COPD (chronic obstructive pulmonary disease) ([place]) 03/19/2015  . Hyperlipidemia 03/19/2015  . Osteoarthritis of both hands 03/19/2015  . Osteoporosis 03/19/2015  .  Acid reflux 03/19/2015  . Major depression, chronic 09/12/2014    1. COPD  Severe disease patient has been controlled over course of the last several years.  She has not had any recent admissions to the hospital.  I think based on her overall medical situation the risk of surgery in her case is high. There is a chance that she may not come off of the ventilator there is also a chance that she may not make it through the surgery.  I would recommend if she decides to go with this surgery quick extubation and observation in the hospital postoperatively.  She may also benefit from BiPAP therapy postoperatively if she is back extubated in the PACU.  Once again I would recommend weighing the risks and benefits in her case if the benefits outweigh the risks then proceed with the surgery and I have made this very clear to the patient as well as the vent. 2. SOB  She is at her baseline she will continue with her current medications 3. Hiatal hernia this is severe symptoms and not regaining benefit from medical management at this point 4. Chronic respiratory failure with hypoxia she will continue with her oxygen use as prescribed also have ordered for blood gas to be done to reassess for pCO2 5. Oxygen dpendent  General Counseling: I have discussed the findings of the evaluation and examination with Dorian Pod.  I have also discussed any further diagnostic evaluation thatmay be needed or ordered today. Arayna verbalizes understanding of the findings of todays visit. We also reviewed her medications today and discussed drug interactions and side effects including but not limited excessive drowsiness and altered mental states. We also discussed that there is always a risk not just to her but also people around her. she has been encouraged to call the office with any questions or concerns that should arise related to todays visit.    Time spent: 54min  I have personally obtained a history, examined the patient,  evaluated laboratory and imaging results, formulated the assessment and plan and placed orders.    Allyne Gee, MD [facility] Pulmonary and Critical Care Sleep medicine

## 2017-09-24 ENCOUNTER — Telehealth: Payer: Self-pay | Admitting: Surgery

## 2017-09-24 NOTE — Telephone Encounter (Signed)
We have received patient's pulmonology clearance for surgery from Dr. Humphrey Rolls. Clearance has been placed in patients chart.

## 2017-09-27 ENCOUNTER — Encounter: Payer: Self-pay | Admitting: Surgery

## 2017-09-27 ENCOUNTER — Ambulatory Visit (INDEPENDENT_AMBULATORY_CARE_PROVIDER_SITE_OTHER): Payer: PPO | Admitting: Surgery

## 2017-09-27 VITALS — BP 145/71 | HR 105 | Temp 98.3°F | Ht 63.0 in | Wt 142.8 lb

## 2017-09-27 DIAGNOSIS — K2100 Gastro-esophageal reflux disease with esophagitis, without bleeding: Secondary | ICD-10-CM

## 2017-09-27 NOTE — Progress Notes (Signed)
Outpatient Surgical Follow Up  09/27/2017  Joyce Rodriguez is an 72 y.o. female.   Chief Complaint  Patient presents with  . Follow-up    Hiatal Hernia    HPI: This [name] is a 72 year old female well-known for recalcitrant reflux as well as a small hiatal hernia.  She continues to have daily reflux symptoms that are classic.  She had a recent barium swallow that I have personally reviewed showing an evidence of a small hiatal hernia with also reflux no stricture or other abnormalities.  There is also manometry that I have reviewed as well showinglow resting LES pressure w weak esophageal peristalsis. Also refer her to pulmonary doctor for her COPD and her FEV1 is 0.8. Dr. Humphrey Rolls also explained to her the potential risk of the surgery including prolonged hospitalization, ventilatory requirement and even death. I have also expressed my concerns and the patient is adamant that she is got daily symptoms that are disabling and she wishes to have the reflux taking care of even if that means that she will not survive.  I had a lengthy discussion with her and her husband that were present at the time of visitation. Actually does not wear oxygen during the day and was able to walk from the parking lot to my office without any oxygen and without any major dyspnea   Past Medical History:  Diagnosis Date  . Arthritis   . COPD (chronic obstructive pulmonary disease) ([place])   . Emphysema of lung ([place])   . Hyperlipidemia   . Hypertension   . Osteoporosis     Past Surgical History:  Procedure Laterality Date  . ABDOMINAL HYSTERECTOMY    . CHOLECYSTECTOMY    . ESOPHAGEAL MANOMETRY N/A 09/13/2017   Procedure: ESOPHAGEAL MANOMETRY (EM);  Surgeon: Mauri Pole, MD;  Location: [facility] ENDOSCOPY;  Service: Endoscopy;  Laterality: N/A;  . ESOPHAGOGASTRODUODENOSCOPY (EGD) WITH PROPOFOL N/A 05/11/2017   Procedure: ESOPHAGOGASTRODUODENOSCOPY (EGD) WITH PROPOFOL;  Surgeon: Lin Landsman, MD;  Location:  [facility] ENDOSCOPY;  Service: Gastroenterology;  Laterality: N/A;  . FOOT SURGERY      Family History  Problem Relation Age of Onset  . Ovarian cancer Mother   . Emphysema Father   . Melanoma Sister   . Lung cancer Brother     Social History:  reports that she quit smoking about 16 years ago. Her smoking use included cigarettes. She has never used smokeless tobacco. She reports that she drinks alcohol. She reports that she does not use drugs.  Allergies:  Allergies  Allergen Reactions  . Codeine Nausea And Vomiting    Pt states that she can tolerate Tramadol fine.    Medications reviewed.    ROS Full ROS performed and is otherwise negative other than what is stated in HPI   BP (!) 145/71   Pulse (!) 105   Temp 98.3 F (36.8 C) (Oral)   Ht 5\' 3"  (1.6 m)   Wt 64.8 kg (142 lb 12.8 oz)   BMI 25.30 kg/m   Physical Exam  Constitutional: She is oriented to person, place, and time. She appears well-developed and well-nourished. No distress.  Neck: No JVD present. No tracheal deviation present. No thyromegaly present.  Cardiovascular: Normal rate and regular rhythm.  Pulmonary/Chest: Effort normal and breath sounds normal. No stridor. No respiratory distress. She has no wheezes.  Abdominal: She exhibits no distension and no mass. There is no tenderness. There is no rebound and no guarding.  Neurological: She is alert and oriented to  person, place, and time. She displays normal reflexes. No cranial nerve deficit. She exhibits normal muscle tone. Coordination normal.  Skin: Skin is warm and dry. She is not diaphoretic.  Psychiatric: She has a normal mood and affect. Her behavior is normal. Judgment and thought content normal.  Nursing note and vitals reviewed.   Assessment/Plan: Intractable reflux not respond to medical therapy and with some degree of dysphasia on a patient with COPA that wears oxygen at night.  I had a lengthy discussion with the patient and the family about the  procedure that would involve a partial fundoplication either laparoscopic versus open depending on her pulmonary status and depending whether or not the patient tolerates pneumoperitoneum or not.  Patient is adamant that she wishes to proceed with antireflux surgery to improve her quality of life.  She is adamant that she is got daily symptoms and she cannot live like this.  I have discussed with the patient in detail about the risk benefit and possible complications including but not limited to: Bleeding, pulmonary complications, ventilatory dependence, prolonged hospitalization, recurrence and even death.  Patient understands and wishes to proceed. Will wear that there is a significant chance of may have to do it open if she is unable to tolerate pneumoperitoneum. I spent over 40 minutes in this encounter with greater than 50% pending coordination and counseling of her care.    Caroleen Hamman, MD [facility] General Surgeon

## 2017-09-27 NOTE — H&P (View-Only) (Signed)
Outpatient Surgical Follow Up  09/27/2017  Joyce Rodriguez is an 72 y.o. female.   Chief Complaint  Patient presents with  . Follow-up    Hiatal Hernia    HPI: This Rhinehart is a 72 year old female well-known for recalcitrant reflux as well as a small hiatal hernia.  She continues to have daily reflux symptoms that are classic.  She had a recent barium swallow that I have personally reviewed showing an evidence of a small hiatal hernia with also reflux no stricture or other abnormalities.  There is also manometry that I have reviewed as well showinglow resting LES pressure w weak esophageal peristalsis. Also refer her to pulmonary doctor for her COPD and her FEV1 is 0.8. Dr. Humphrey Rolls also explained to her the potential risk of the surgery including prolonged hospitalization, ventilatory requirement and even death. I have also expressed my concerns and the patient is adamant that she is got daily symptoms that are disabling and she wishes to have the reflux taking care of even if that means that she will not survive.  I had a lengthy discussion with her and her husband that were present at the time of visitation. Actually does not wear oxygen during the day and was able to walk from the parking lot to my office without any oxygen and without any major dyspnea   Past Medical History:  Diagnosis Date  . Arthritis   . COPD (chronic obstructive pulmonary disease) ([place])   . Emphysema of lung ([place])   . Hyperlipidemia   . Hypertension   . Osteoporosis     Past Surgical History:  Procedure Laterality Date  . ABDOMINAL HYSTERECTOMY    . CHOLECYSTECTOMY    . ESOPHAGEAL MANOMETRY N/A 09/13/2017   Procedure: ESOPHAGEAL MANOMETRY (EM);  Surgeon: Mauri Pole, MD;  Location: [facility] ENDOSCOPY;  Service: Endoscopy;  Laterality: N/A;  . ESOPHAGOGASTRODUODENOSCOPY (EGD) WITH PROPOFOL N/A 05/11/2017   Procedure: ESOPHAGOGASTRODUODENOSCOPY (EGD) WITH PROPOFOL;  Surgeon: Lin Landsman, MD;  Location:  [facility] ENDOSCOPY;  Service: Gastroenterology;  Laterality: N/A;  . FOOT SURGERY      Family History  Problem Relation Age of Onset  . Ovarian cancer Mother   . Emphysema Father   . Melanoma Sister   . Lung cancer Brother     Social History:  reports that she quit smoking about 16 years ago. Her smoking use included cigarettes. She has never used smokeless tobacco. She reports that she drinks alcohol. She reports that she does not use drugs.  Allergies:  Allergies  Allergen Reactions  . Codeine Nausea And Vomiting    Pt states that she can tolerate Tramadol fine.    Medications reviewed.    ROS Full ROS performed and is otherwise negative other than what is stated in HPI   BP (!) 145/71   Pulse (!) 105   Temp 98.3 F (36.8 C) (Oral)   Ht 5\' 3"  (1.6 m)   Wt 64.8 kg (142 lb 12.8 oz)   BMI 25.30 kg/m   Physical Exam  Constitutional: She is oriented to person, place, and time. She appears well-developed and well-nourished. No distress.  Neck: No JVD present. No tracheal deviation present. No thyromegaly present.  Cardiovascular: Normal rate and regular rhythm.  Pulmonary/Chest: Effort normal and breath sounds normal. No stridor. No respiratory distress. She has no wheezes.  Abdominal: She exhibits no distension and no mass. There is no tenderness. There is no rebound and no guarding.  Neurological: She is alert and oriented to  person, place, and time. She displays normal reflexes. No cranial nerve deficit. She exhibits normal muscle tone. Coordination normal.  Skin: Skin is warm and dry. She is not diaphoretic.  Psychiatric: She has a normal mood and affect. Her behavior is normal. Judgment and thought content normal.  Nursing note and vitals reviewed.   Assessment/Plan: Intractable reflux not respond to medical therapy and with some degree of dysphasia on a patient with COPA that wears oxygen at night.  I had a lengthy discussion with the patient and the family about the  procedure that would involve a partial fundoplication either laparoscopic versus open depending on her pulmonary status and depending whether or not the patient tolerates pneumoperitoneum or not.  Patient is adamant that she wishes to proceed with antireflux surgery to improve her quality of life.  She is adamant that she is got daily symptoms and she cannot live like this.  I have discussed with the patient in detail about the risk benefit and possible complications including but not limited to: Bleeding, pulmonary complications, ventilatory dependence, prolonged hospitalization, recurrence and even death.  Patient understands and wishes to proceed. Will wear that there is a significant chance of may have to do it open if she is unable to tolerate pneumoperitoneum. I spent over 40 minutes in this encounter with greater than 50% pending coordination and counseling of her care.    Caroleen Hamman, MD [facility] General Surgeon

## 2017-09-27 NOTE — Patient Instructions (Signed)
We have spoken today about repairing your Hiatal Hernia. Your surgery has been scheduled for 10/19/2017 with Dr. Dahlia Byes.  Plan to be in the hospital for 2-3 days if the minimally invasive surgery is completed without having to make a bigger incision. If the bigger incision is made, you will most likely need to be in the hospital 7-10 days. You will be on a liquid diet and need to recover for 2 weeks following your surgery prior to doing any of your normal activities. At the 2 week mark, we will see you in the office and if you are doing ok we will advance your diet and activity level as you tolerate.  Please see your Blue (Pre-care) Sheet for more information regarding your surgery.  Please call our office with any questions or concerns that you have regarding your surgery and recovery.     Laparoscopic Nissen Fundoplication Laparoscopic Nissen fundoplication is surgery to relieve heartburn and other problems caused by gastric fluids flowing up into your esophagus. The esophagus is the tube that carries food and liquid from your throat to your stomach. Normally, the muscle that sits between your stomach and your esophagus (lower esophageal sphincter or LES) keeps stomach fluids in your stomach. In some people, the LES does not work properly, and stomach fluids flow up into the esophagus. This can happen when part of the stomach bulges through the LES (hiatal hernia). The backward flow of stomach fluids can cause a type of severe and long-standing heartburn that is called gastroesophageal reflux disease (GERD). You may need this surgery if other treatments for GERD have not helped. In a laparoscopic Nissen fundoplication, the upper part of your stomach is wrapped around the lower part of your esophagus to strengthen the LES and prevent reflux. If you have a hiatal hernia, it will also be repaired with this surgery. The procedure is done through several small incisions in your abdomen. It is performed  using a thin, telescopic instrument (laparoscope) and other instruments that can pass through the scope or through other small incisions. Tell a health care provider about:  Any allergies you have.  All medicines you are taking, including vitamins, herbs, eye drops, creams, and over-the-counter medicines.  Any problems you or family members have had with anesthetic medicines.  Any blood disorders you have.  Any surgeries you have had.  Any medical conditions you have. What are the risks? Generally, this is a safe procedure. However, problems may occur, including:  Difficulty swallowing (dysphagia).  Bloating.  Nausea or vomiting.  Damage to the lung, causing a collapsed lung.  Infection or bleeding. What happens before the procedure?  Ask your health care provider about:  Changing or stopping your regular medicines. This is especially important if you are taking diabetes medicines or blood thinners.  Taking medicines such as aspirin and ibuprofen. These medicines can thin your blood. Do not take these medicines before your procedure if your health care provider asks you not to.  Follow your health care provider's instructions about eating or drinking restrictions.  Plan to have someone take you home after the procedure. What happens during the procedure?  An IV tube will be inserted into one of your veins. It will be used to give you fluids and medicines during the procedure.  You will be given a medicine that makes you fall asleep (general anesthetic).  Your abdomen will be cleaned with a germ-killing solution (antiseptic).  The surgeon will make a small incision in your abdomen and  insert a tube through the incision.  Your abdomen will be filled with a gas. This helps the surgeon to see your organs more easily and it makes more space to work.  The surgeon will insert the laparoscope through the incision. The scope has a camera that will send pictures to a monitor in  the operating room.  The surgeon will make several other small incisions in your abdomen to insert the other instruments that are needed during the procedure.  Another instrument (dilator) will be passed through your mouth and down your esophagus into the upper part of your stomach. The dilator will prevent your LES from being closed too tightly during surgery.  The surgeon will pass the top portion of your stomach behind the lower part of your esophagus and wrap it all the way around. This will be stitched into place.  If you have a hiatal hernia, it will be repaired during this procedure.  All instruments will be removed, and your incisions will be closed under your skin with stitches (sutures). Skin adhesive strips may also be used.  A bandage (dressing) will be placed on your skin over the incisions. The procedure may vary among health care providers and hospitals. What happens after the procedure?  You will be moved to a recovery area.  Your blood pressure, heart rate, breathing rate, and blood oxygen level will be monitored often until the medicines you were given have worn off.  You will be given pain medicine as needed.  Your IV tube will be kept in until you are able to drink fluids. This information is not intended to replace advice given to you by your health care provider. Make sure you discuss any questions you have with your health care provider. Document Released: 04/20/2014 Document Revised: 09/05/2015 Document Reviewed: 11/29/2013 Elsevier Interactive Patient Education  2017 [employer]   Laparoscopic Nissen Fundoplication, Care After Refer to this sheet in the next few weeks. These instructions provide you with information about caring for yourself after your procedure. Your health care provider may also give you more specific instructions. Your treatment has been planned according to current medical practices, but problems sometimes occur. Call your health care  provider if you have any problems or questions after your procedure. What can I expect after the procedure? After the procedure, it is common to have:  Difficulty swallowing (dysphagia).  Excess gas (bloating). Follow these instructions at home: Medicines   Take medicines only as directed by your health care provider.  Do not drive or operate heavy machinery while taking pain medicine. Incision care   There are many different ways to close and cover an incision, including stitches (sutures), skin glue, and adhesive strips. Follow your health care provider's instructions about:  Incision care.  Bandage (dressing) changes and removal.  Incision closure removal.  Check your incision areas every day for signs of infection. Watch for:  Redness, swelling, or pain.  Fluid, blood, or pus.  Do not take baths, swim, or use a hot tub until your health care provider approves. Take showers as directed by your health care provider. Eating and drinking   Follow your health care provider's instructions about eating.  You may need to follow a liquid-only diet for 2 weeks, followed by a diet of soft foods for 2 weeks.  You should return to your usual diet gradually.  Drink enough fluid to keep your urine clear or pale yellow. Activity   Return to your normal activities as directed by  your health care provider. Ask your health care provider what activities are safe for you.  Avoid strenuous exercise.  Do not lift anything that is heavier than 10 lb (4.5 kg).  Ask your health care provider when you can:  Return to sexual activity.  Drive.  Go back to work. Contact a health care provider if:  You have a fever.  Your pain gets worse or is not helped by medicine.  You have frequent nausea or vomiting.  You have continued abdominal bloating.  You have an ongoing (persistent) cough.  You have redness, swelling, or pain in any incision areas.  You have fluid, blood, or pus  coming from any incisions. Get help right away if:  You have trouble breathing.  You are unable to swallow.  You have persistent vomiting.  You have blood in your vomit.  You have severe abdominal pain. This information is not intended to replace advice given to you by your health care provider. Make sure you discuss any questions you have with your health care provider. Document Released: 11/21/2003 Document Revised: 09/05/2015 Document Reviewed: 11/29/2013 Elsevier Interactive Patient Education  2017 [place].   Diet After Nissen Fundoplication Surgery This diet information is for patients who have recently had Nissen fundoplication surgery to correct reflux disease or to repair various types of hernias, such as hiatal hernia and intrathoracic stomach. This diet may also be used for other gastrointestinal surgeries, such as Heller myotomy and repair of achalasia. The diet will help control diarrhea, excess gas and swallowing problems, which may occur after this type of surgery. Keeping Your Stomach from Stretching Eat small, frequent meals (six to eight per day). This will help you consume the majority of the nutrients you need without causing your stomach to feel full or distended.  Drinking large amounts of fluids with meals can stretch your stomach. You may drink fluids between meals as often as you like, but limit fluids to 1/2 cup (4 fluid ounces) with meals and one cup (8 fluid ounces) with snacks.  Sit upright while eating and stay upright for 30 minutes after each meal. Gravity can help food move through your digestive tract. Do not lie down after eating. Sit upright for 2 hours after your last meal or snack of the day.  Eat very slowly. Take your time when eating.  Take small bites and chew your food well to help aid in swallowing and digestion.  Avoid crusty breads and sticky, gummy foods, such as bananas, fresh doughy breads, rolls and doughnuts. These types of foods become  sticky and difficult to swallow.  Toasted breads tend to be better tolerated.  Lastly, if you eat sweets, consume them at the end of your meal to avoid a group of symptoms referred to as "dumping syndrome". This describes the rapid emptying of foods from the stomach to the small intestine. Sweetened beverages, candy and desserts move more rapidly and dump quickly into the intestines. This can cause symptoms of nausea, weakness, cold sweats, cramps, diarrhea and dizzy spells.  Avoiding Gas Avoid drinking through a straw. Do not chew gum or tobacco. These actions cause you to swallow air, which produces excess gas in your stomach. Chew with your mouth closed.  Avoid any foods that cause stomach gas and distention. These foods include corn, dried beans, peas, lentils, onions, broccoli, cauliflower and any food from the cabbage family.  Avoid carbonated drinks, alcohol, citrus and tomato products.  When will I be able to eat a  soft diet? After Nissen fundoplication surgery, your diet will be advanced slowly by your surgeon. Generally, you will be on a clear liquid diet for the first few meals. Then you will advance to the full liquid diet for a meal or two and eventually to a Nissen soft diet. Please be aware that each patient's tolerance to food is different. Your doctor will advance your diet depending on how well you progress after surgery. Clear Liquid Diet  The first diet after surgery is the clear liquid diet. It includes the following liquids: Apple juice  Cranberry juice  Grape juice  Chicken broth  Beef broth  Flavored gelatin (Jell-O)  Decaf tea and coffee  Caffeinated beverages are permitted based on tolerance  Popsicles  [place] ice Carbonated drinks (sodas) are not allowed for the first six to eight weeks after surgery. After this time you can try them again in small amounts.  Full Liquid Diet The full liquid diet contains anything on the clear liquid diet, plus: Milk, soy, rice  and almond (no chocolate)  Cream of wheat, cream of rice, grits  Strained creamed soups (no tomato or broccoli)  Vanilla and strawberry-flavored ice cream  Sherbet  Blended, custard styled or whipped yogurt (plain or vanilla only)  Vanilla and butterscotch pudding (no chocolate or coconut)  Nutritional drinks including Ensure, Boost, Carnation Instant Breakfast (no chocolate-flavored) Note: Dairy products, such as milk, ice cream and pudding, may cause diarrhea in some people just after surgery. You may need to avoid milk products. If so, substitute them with lactose-free beverages, such as soy, rice, Lactaid or almond milks.  Nissen Soft Diet Food Category Foods to Choose Foods to Avoid  Beverages Milk, such as, whole, 2%, 1%, non-fat, or skim, soy, rice, almond  Caffeinated and decaf tea and coffee  Powdered drink mixes (in moderation)  Non-citrus juices (apple, grape, cranberry or blends of these)  Fruit nectars  Nutritional drinks including Boost, Ensure, Carnation Instant Breakfast Chocolate milk, cocoa or other chocolate-flavored drinks  Carbonated drinks  Alcohol  Citrus juices like orange, grapefruit, lemon and lime  Breads Pancakes, [place] toast and waffles  Crackers (saltine, butter, soda, graham, Goldfish and Cheese Nips)  Toasted bread Untoasted bread, bagels, [place] and hard rolls, English muffins  Crackers with nuts, seeds, fresh or dried fruit, coconut, or highly seasoned, such as garlic or onion-flavored  Sweet rolls, coffee cake or doughnuts  Cereals Well cooked cereals, such as oatmeal (plain or flavored)  Cold cereal (Cornflakes, Rice Krispies, Cheerios, Special K plain, Rice Chex and puffed rice) Very coarse cereal, such as bran, shredded wheat  Any cereal with fresh or dried fruit, coconut, seeds or nuts  Desserts Eat in moderation and do not eat desserts or sweets by themselves. Plain cakes, cookies and cream-filled pies  Vanilla and butterscotch  pudding or custard  Ice cream, ice milk, frozen yogurt and sherbet  Gelatin made from allowed foods  Fruit ices and popsicles Desserts containing chocolate, coconut, nuts, seeds, fresh or dried fruit, peppermint or spearmint  Eggs  Poached, hard boiled or scrambled Fried eggs and highly seasoned eggs (deviled eggs)  Fats Eat in moderation. Butter and margarine  Mayonnaise and vegetable oils  Mildly seasoned cream sauces and gravies  Plain cream cheese  Sour cream Highly seasoned salad dressings, cream sauces and gravies  Bacon, bacon fat, ham fat, lard and salt pork  Fried foods  Nuts  Fruits Fruit juice  Any canned or cooked fruit except those listed in the  AVOID column ALL fresh fruits, such as citrus, bananas and pineapple  Canned pineapple  Dried fruits, such as raisins, berries  Fruits with seeds, such as berries, kiwi and figs  Meat, Fish, Poultry, and [employer] may be ground, minced or chopped to ease swallowing and digestion  Tender, well cooked and moist cuts of beef, chicken, [place] and pork  Veal and lamb  Flaky, cooked fish  Canned tuna  Cottage and ricotta cheeses  Mild cheese, such as American, brick, mozzarella and baby Swiss  Creamy peanut butter  Plain custard or blended fruit yogurt  Moist casseroles, such as macaroni & cheese, tuna noodle  Grilled or toasted cheese sandwich Tough meats with a lot of gristle  Fried, highly seasoned, smoked and fatty meat, fish or poultry, such as frankfurters, luncheon meats, sausage, bacon, spare ribs, beef brisket, sardines, anchovies, duck and goose  Chili and other entrees made with pepper or chili pepper  Shellfish  Strongly flavored cheeses, such as sharp cheese, extra sharp cheddar, cheese containing peppers or other seasonings  Crunchy peanut butter  Any yogurt with nuts, seeds, coconut, strawberries or raspberries  Potatoes and Starches Peeled, mashed or boiled white or sweet potatoes  Oven-baked potatoes  without skin  Well cooked white rice, enriched noodles, barley, spaghetti, macaroni and other pastas Fried potatoes, potato skins and potato chips  Hard and soft taco shells  Fried, brown or wild rice  Soups Mildly flavored meat stocks  Cream soups made from allowed foods Highly seasoned soups and tomato based soups, cream soups made with gas producing vegetables, such as broccoli, cauliflower, onion, etc.  Sweets and Snacks Use in moderation and do not eat large amounts of sweets by themselves. Syrup, honey, jelly and seedless jam  Plain hard candies and plain candies made with allowed ingredients  Molasses  Marshmallows  Other candy made from allowed ingredients  Thin pretzels Jam, marmalade and preserves  Chocolate in any form  Any candy containing nuts, coconut, seeds, peppermint, spearmint or dried or fresh fruit  Popcorn, potato chips, tortilla chips  Soft or hard thick pretzels, such as sourdough  Vegetables Well cooked soft vegetables without seeds or skins, such as asparagus tips, beets, carrots, green and wax beans, chopped spinach, tender canned baby peas, squash and pumpkin Raw vegetables, tomatoes, tomato juice, tomato sauce and V-8 juice  Gas producing vegetables, such as broccoli, Brussel sprouts, cabbage, cauliflower, onions, corn, cucumber, green peppers, rutabagas, turnips, radishes and sauerkraut  Dried beans, peas and lentils  Miscellaneous Salt and spices in moderation  Mustard and vinegar in moderation Fried or highly seasoned foods  Coconut and seeds  Pickles and olives  Chili sauces, ketchup, barbecue sauce, horseradish, black pepper, chili powder and onion and garlic seasonings  Any other strongly flavored seasoning, condiment, spice or herb not tolerated  Any food not tolerated

## 2017-09-29 ENCOUNTER — Encounter: Payer: Self-pay | Admitting: Gastroenterology

## 2017-09-29 ENCOUNTER — Telehealth: Payer: Self-pay | Admitting: Surgery

## 2017-09-29 NOTE — Telephone Encounter (Signed)
Pt advised of pre op date/time and sx date. Sx: 10/19/17 with Dr Corinna Lines assisting-Laparoscopic nissen fundoplication.  Pre op: 10/11/17 @ 1:45pm  Patient made aware to call [phone], between 1-3:00pm the day before surgery, to find out what time to arrive.

## 2017-09-30 DIAGNOSIS — J441 Chronic obstructive pulmonary disease with (acute) exacerbation: Secondary | ICD-10-CM | POA: Diagnosis not present

## 2017-10-02 DIAGNOSIS — J441 Chronic obstructive pulmonary disease with (acute) exacerbation: Secondary | ICD-10-CM | POA: Diagnosis not present

## 2017-10-08 DIAGNOSIS — R1310 Dysphagia, unspecified: Secondary | ICD-10-CM

## 2017-10-11 ENCOUNTER — Other Ambulatory Visit: Payer: Self-pay

## 2017-10-11 ENCOUNTER — Encounter
Admission: RE | Admit: 2017-10-11 | Discharge: 2017-10-11 | Disposition: A | Discharge status: Home / Self Care | Payer: PPO | Source: Ambulatory Visit | Attending: Surgery | Admitting: Surgery

## 2017-10-11 DIAGNOSIS — J449 Chronic obstructive pulmonary disease, unspecified: Secondary | ICD-10-CM | POA: Insufficient documentation

## 2017-10-11 DIAGNOSIS — Z01812 Encounter for preprocedural laboratory examination: Secondary | ICD-10-CM | POA: Insufficient documentation

## 2017-10-11 DIAGNOSIS — Z01810 Encounter for preprocedural cardiovascular examination: Secondary | ICD-10-CM | POA: Insufficient documentation

## 2017-10-11 HISTORY — DX: Dyspnea, unspecified: R06.00

## 2017-10-11 HISTORY — DX: Gastro-esophageal reflux disease without esophagitis: K21.9

## 2017-10-11 HISTORY — DX: Other complications of anesthesia, initial encounter: T88.59XA

## 2017-10-11 HISTORY — DX: Hypoxemia: R09.02

## 2017-10-11 HISTORY — DX: Adverse effect of unspecified anesthetic, initial encounter: T41.45XA

## 2017-10-11 LAB — CBC
HEMATOCRIT: 38.7 % (ref 35.0–47.0)
Hemoglobin: 13.3 g/dL (ref 12.0–16.0)
MCH: 31.1 pg (ref 26.0–34.0)
MCHC: 34.3 g/dL (ref 32.0–36.0)
MCV: 90.6 fL (ref 80.0–100.0)
Platelets: 328 10*3/uL (ref 150–440)
RBC: 4.27 MIL/uL (ref 3.80–5.20)
RDW: 13.5 % (ref 11.5–14.5)
WBC: 6.7 10*3/uL (ref 3.6–11.0)

## 2017-10-11 LAB — BASIC METABOLIC PANEL
Anion gap: 12 (ref 5–15)
BUN: 20 mg/dL (ref 8–23)
CO2: 31 mmol/L (ref 22–32)
CREATININE: 0.78 mg/dL (ref 0.44–1.00)
Calcium: 9.4 mg/dL (ref 8.9–10.3)
Chloride: 95 mmol/L — ABNORMAL LOW (ref 98–111)
GFR calc Af Amer: >60 mL/min (ref >60)
GFR calc non Af Amer: >60 mL/min (ref >60)
GLUCOSE: 92 mg/dL (ref 70–99)
Potassium: 3 mmol/L — ABNORMAL LOW (ref 3.5–5.1)
Sodium: 138 mmol/L (ref 135–145)

## 2017-10-11 MED ORDER — CHLORHEXIDINE GLUCONATE CLOTH 2 % EX PADS
6.0000 | MEDICATED_PAD | Freq: Once | CUTANEOUS | Status: DC
  Filled 2017-10-11: qty 6

## 2017-10-11 NOTE — Patient Instructions (Signed)
Your procedure is scheduled on: 10/19/17 Tues Report to Same Day Surgery 2nd floor medical mall [facility] Entrance-take elevator on left to 2nd floor.  Check in with surgery information desk.) To find out your arrival time please call [phone] between 1PM - 3PM on 10/18/17 Mon  Remember: Instructions that are not followed completely may result in serious medical risk, up to and including death, or upon the discretion of your surgeon and anesthesiologist your surgery may need to be rescheduled.    _x___ 1. Do not eat food after midnight the night before your procedure. You may drink clear liquids up to 2 hours before you are scheduled to arrive at the hospital for your procedure.  Do not drink clear liquids within 2 hours of your scheduled arrival to the hospital.  Clear liquids include  --Water or Apple juice without pulp  --Clear carbohydrate beverage such as ClearFast or Gatorade  --Black Coffee or Clear Tea (No milk, no creamers, do not add anything to                  the coffee or Tea Type 1 and type 2 diabetics should only drink water.  No gum chewing or hard candies.     __x__ 2. No Alcohol for 24 hours before or after surgery.   __x__3. No Smoking or e-cigarettes for 24 prior to surgery.  Do not use any chewable tobacco products for at least 6 hour prior to surgery   ____  4. Bring all medications with you on the day of surgery if instructed.    __x__ 5. Notify your doctor if there is any change in your medical condition     (cold, fever, infections).    x___6. On the morning of surgery brush your teeth with toothpaste and water.  You may rinse your mouth with mouth wash if you wish.  Do not swallow any toothpaste or mouthwash.   Do not wear jewelry, make-up, hairpins, clips or nail polish.  Do not wear lotions, powders, or perfumes. You may wear deodorant.  Do not shave 48 hours prior to surgery. Men may shave face and neck.  Do not bring valuables to the hospital.     [facility] is not responsible for any belongings or valuables.               Contacts, dentures or bridgework may not be worn into surgery.  Leave your suitcase in the car. After surgery it may be brought to your room.  For patients admitted to the hospital, discharge time is determined by your                       treatment team.  _  Patients discharged the day of surgery will not be allowed to drive home.  You will need someone to drive you home and stay with you the night of your procedure.    Please read over the following fact sheets that you were given:   [facility] Preparing for Surgery and or MRSA Information   _x___ Take anti-hypertensive listed below, cardiac, seizure, asthma,     anti-reflux and psychiatric medicines. These include:  1. escitalopram (LEXAPRO) 10 MG tablet  2.esomeprazole (NEXIUM) 40 MG capsule  3.PROAIR HFA 108 (90 Base) MCG/ACT inhaler  4.rosuvastatin (CRESTOR) 20 MG tablet  5.SPIRIVA HANDIHALER 18 MCG inhalation capsule  6.  ____Fleets enema or Magnesium Citrate as directed.   _x___ Use CHG Soap or sage wipes  as directed on instruction sheet   ____ Use inhalers on the day of surgery and bring to hospital day of surgery  ____ Stop Metformin and Janumet 2 days prior to surgery.    ____ Take 1/2 of usual insulin dose the night before surgery and none on the morning     surgery.   _x___ Follow recommendations from Cardiologist, Pulmonologist or PCP regarding          stopping Aspirin, Coumadin, Plavix ,Eliquis, Effient, or Pradaxa, and Pletal.  X____Stop Anti-inflammatories such as Advil, Aleve, Ibuprofen, Motrin, Naproxen, Naprosyn, Goodies powders or aspirin products. OK to take Tylenol and                          Celebrex.   _x___ Stop supplements until after surgery.  But may continue Vitamin D, Vitamin B,       and multivitamin.   ____ Bring C-Pap to the hospital.

## 2017-10-11 NOTE — Pre-Procedure Instructions (Addendum)
Pulmonary clearance on chart. Taken over to OR for anesthesia consult.

## 2017-10-12 ENCOUNTER — Telehealth: Payer: Self-pay

## 2017-10-12 MED ORDER — POTASSIUM CHLORIDE 20 MEQ PO PACK: 40.0000 meq | PACK | Freq: Two times a day (BID) | ORAL | 0 refills | Status: DC

## 2017-10-12 NOTE — Telephone Encounter (Signed)
Called patient to let her know that she needs to start taking Potassium since her labs showed that it was low. Prescription was sent to her pharmacy.

## 2017-10-12 NOTE — Pre-Procedure Instructions (Signed)
KT 3.0 FAXED TO DR Dahlia Byes

## 2017-10-12 NOTE — Pre-Procedure Instructions (Signed)
REQUEST FOR CARDIAC CLEARANCE CALLED AND FAXED TO KRISTIE AT [facility]. ALSO FAXED FYI TO DR Dahlia Byes AND SPOKE WITH Janace Hoard

## 2017-10-15 ENCOUNTER — Telehealth: Payer: Self-pay | Admitting: Cardiovascular Disease

## 2017-10-15 ENCOUNTER — Telehealth: Payer: Self-pay | Admitting: Surgery

## 2017-10-15 DIAGNOSIS — K449 Diaphragmatic hernia without obstruction or gangrene: Secondary | ICD-10-CM | POA: Insufficient documentation

## 2017-10-15 DIAGNOSIS — R9431 Abnormal electrocardiogram [ECG] [EKG]: Secondary | ICD-10-CM | POA: Insufficient documentation

## 2017-10-15 NOTE — Telephone Encounter (Signed)
I have faxed Cardiac Clearance for Surgery form to Dr Donivan Scull office. Patient has an appointment on 10/18/17 @ 8:20am due to an abnormal EKG. Patient is scheduled for surgery with Dr Dahlia Byes on 10/19/17 for a lap nissen fundoplication pending Cardiac Clearance. Dr Dahlia Byes has been advised.   Phone #: [phone] Fax # [phone]

## 2017-10-15 NOTE — Progress Notes (Signed)
Cardiology Office Note  Date:  10/18/2017   ID:  Joyce Rodriguez, DOB [birth year], MRN [id number]  PCP:  Joyce Maple, MD   Chief Complaint  Patient presents with  . other    Ref by Dr. Dahlia Byes for an abnormal EKG & cardiac clearance for LAPAROSCOPIC NISSEN FUNDOPLICATION surgery that is scheduled tomorrow. Meds reviewed by the pt. verbally. Pt. c/o chest pain when has indigestion.     HPI:  [name] is a 72 year old woman with past medical history of severe COPD  oxygen dependent.  Former smoker, quit 15  hypertension scheduled for hiatal hernia surgery 10/19/2017  Referred by Dr. Caroleen Hamman for preoperative evaluation and abnormal EKG  Uses oxygen, gets SOB, Presents today without oxygen Goes shopping, without oxygen  severe symptoms associated with the hiatal hernia which make it difficult for her to lay flat at nighttime.  She cannot sleep at nighttime.  She takes Nexium without any relief.  Lots of Tums for many years  Seen by Dr. Humphrey Rolls, [facility] medical, pulmonary Risks discussed concerning the upcoming surgery Described as having severe disease controlled over the past several years Felt to be high risk given her pulmonary disease Recommended she consider bipap postoperatively  EKG dated 10/11/2017 with normal sinus rhythm PVC Very mild Nonspecific ST abnormality ( on the PVC beat) Otherwise benign-appearing EKG  CT scan chest 2011 CT chest abdomen 2014, images reviewed with her in detail pulled up in the office This showed mild to moderate aortic atherosclerosis arch descending aorta Mild diffuse coronary calcification  Previous hospital admission January 2018 for COPD exacerbation Treated with antibiotics steroids inhalers nebulizers  EKG personally reviewed by myself on todays visit  shows normal sinus rhythm With rate 89 bpm with no significant ST or T-wave changes  Recent potassium 3.0 she was given powder packets 2  PMH:   has a past medical  history of Arthritis, Complication of anesthesia, COPD (chronic obstructive pulmonary disease) ([place]), Dyspnea, Emphysema of lung ([place]), GERD (gastroesophageal reflux disease), Hyperlipidemia, Hypertension, Osteoporosis, and Oxygen deficiency.  PSH:    Past Surgical History:  Procedure Laterality Date  . ABDOMINAL HYSTERECTOMY    . CHOLECYSTECTOMY    . ESOPHAGEAL MANOMETRY N/A 09/13/2017   Procedure: ESOPHAGEAL MANOMETRY (EM);  Surgeon: Mauri Pole, MD;  Location: [facility] ENDOSCOPY;  Service: Endoscopy;  Laterality: N/A;  . ESOPHAGOGASTRODUODENOSCOPY (EGD) WITH PROPOFOL N/A 05/11/2017   Procedure: ESOPHAGOGASTRODUODENOSCOPY (EGD) WITH PROPOFOL;  Surgeon: Lin Landsman, MD;  Location: [facility] ENDOSCOPY;  Service: Gastroenterology;  Laterality: N/A;  . FOOT SURGERY      Current Outpatient Medications  Medication Sig Dispense Refill  . Aspirin-Acetaminophen-Caffeine (GOODY HEADACHE PO) Take 1 packet by mouth daily as needed (headaches).    . cholecalciferol (VITAMIN D) 1000 units tablet Take 1,000 Units by mouth daily.    Marland Kitchen escitalopram (LEXAPRO) 10 MG tablet Take 1 tablet (10 mg total) by mouth daily. 90 tablet 4  . esomeprazole (NEXIUM) 40 MG capsule Take 1 capsule (40 mg total) by mouth 2 (two) times daily before a meal. 180 capsule 0  . Fluticasone-Salmeterol (ADVAIR DISKUS) 250-50 MCG/DOSE AEPB Inhale 1 puff into the lungs 2 (two) times daily.    . hydrochlorothiazide (HYDRODIURIL) 25 MG tablet Take 1 tablet (25 mg total) by mouth daily. 90 tablet 4  . Multiple Vitamin (MULTIVITAMIN WITH MINERALS) TABS tablet Take 1 tablet by mouth daily.    . OXYGEN Inhale into the lungs.    Marland Kitchen PROAIR HFA 108 (90 Base)  MCG/ACT inhaler INHALE 2 PUFFS BY MOUTH EVERY 6 HOURS AS NEEDED (Patient taking differently: INHALE 2 PUFFS BY MOUTH EVERY 6 HOURS AS NEEDED SHORTNESS OF BREATH) 76.5 g 0  . rosuvastatin (CRESTOR) 20 MG tablet Take 1 tablet (20 mg total) by mouth daily. 90 tablet 4  . SPIRIVA HANDIHALER  18 MCG inhalation capsule Place 18 mcg into inhaler and inhale daily.     . traMADol (ULTRAM) 50 MG tablet Take 1 tablet (50 mg total) by mouth every 12 (twelve) hours as needed. for pain 100 tablet 0  . potassium chloride (KLOR-CON) 20 MEQ packet Take 40 mEq by mouth 2 (two) times daily. (Patient not taking: Reported on 10/18/2017) 10 packet 0   No current facility-administered medications for this visit.      Allergies:   Codeine   Social History:  The patient  reports that she quit smoking about 16 years ago. Her smoking use included cigarettes. She has never used smokeless tobacco. She reports that she drinks alcohol. She reports that she does not use drugs.   Family History:   family history includes Emphysema in her father; Lung cancer in her brother; Melanoma in her sister; Ovarian cancer in her mother.    Review of Systems: Review of Systems  Constitutional: Negative.   Respiratory: Negative.   Cardiovascular: Negative.   Gastrointestinal: Positive for heartburn.       Chest fullness  Musculoskeletal: Negative.   Neurological: Negative.   Psychiatric/Behavioral: Negative.   All other systems reviewed and are negative.    PHYSICAL EXAM: VS:  BP 120/64 (BP Location: Left Arm, Patient Position: Sitting, Cuff Size: Normal)   Ht 5\' 3"  (1.6 m)   Wt 140 lb 12 oz (63.8 kg)   BMI 24.93 kg/m  , BMI Body mass index is 24.93 kg/m. GEN: Well nourished, well developed, in no acute distress  HEENT: normal  Neck: no JVD, carotid bruits, or masses Cardiac: RRR; no murmurs, rubs, or gallops,no edema  Respiratory:  clear to auscultation bilaterally, normal work of breathing GI: soft, nontender, nondistended, + BS MS: no deformity or atrophy  Skin: warm and dry, no rash Neuro:  Strength and sensation are intact Psych: euthymic mood, full affect   Recent Labs: 04/19/2017: ALT 17; TSH 1.330 10/11/2017: BUN 20; Creatinine, Ser 0.78; Hemoglobin 13.3; Platelets 328; Potassium 3.0; Sodium  138    Lipid Panel Lab Results  Component Value Date   CHOL 186 10/07/2016   HDL 83 03/19/2016   LDLCALC 72 03/19/2016   TRIG 143 10/07/2016      Wt Readings from Last 3 Encounters:  10/18/17 140 lb 12 oz (63.8 kg)  10/11/17 142 lb (64.4 kg)  09/27/17 142 lb 12.8 oz (64.8 kg)       ASSESSMENT AND PLAN:  Chronic obstructive pulmonary disease, unspecified COPD type ([place]) Managed by pulmonary Stable with no recent COPD exacerbations Does not use oxygen for prolonged periods of time No respiratory distress in the office on exam with no oxygen today  Acute on chronic respiratory failure with hypoxia ([place]) - Plan: EKG 12-Lead Last hospital admission over one year ago for COPD exacerbation otherwise has been doing well  Essential hypertension She may be over to tolerate half dose HCTZ Reports blood pressure sometimes runs low at home No changes made recommend she talk with primary care  Hypokalemia Likely secondary to HCTZ She also does report one bout of diarrhea lost 5 pounds Recommended she take potassium 10 mEq daily Take several  doses today given number was so low 7 days ago She reports having repeat blood work scheduled for today or tomorrow  Hiatal hernia Preop clearance Acceptable risk from cardiac perspective No clear symptoms concerning for angina Best view of her risk factors include the CT chest that showed mild diffuse coronary calcifications but nothing significant Unable to exclude soft calcified plaque. Denies any changes in her exertional capacity is turning for angina or unstable angina  Abnormal EKG - Plan: EKG 12-Lead Previous EKG January 1 with ectopy explaining nonspecific ST abnormality on that one beat Otherwise benign appearing EKG today and July 1 No further workup at this time  Disposition:   F/U  12 months as needed   Total encounter time more than 45 minutes  Greater than 50% was spent in counseling and coordination of care with the  patient    Orders Placed This Encounter  Procedures  . EKG 12-Lead     Signed, Esmond Plants, M.D., Ph.D. 10/18/2017  [place], [place]

## 2017-10-15 NOTE — Telephone Encounter (Signed)
I called and spoke with the patient. She is aware that Dr. Rockey Situ has had a cancellation for Monday 10/18/17 @ 8:20 am and that we can see her then for surgical clearance. The patient is aware and agreeable. Appt r/s to 10/18/17.  I have called Angie at [place] [facility] and she is aware the patient has been rescheduled for 7/8 @ 8:20 am to see Dr. Rockey Situ.

## 2017-10-15 NOTE — Telephone Encounter (Signed)
Joyce Rodriguez from [place] patients surgery is scheduled for Tues 7/9 but appointment with Dr. Rockey Situ is scheduled 8/28 Would like to know if there is anyway possible for patient to be seen sooner Please call [phone] or Joyce Rodriguez's cell

## 2017-10-18 ENCOUNTER — Encounter: Payer: Self-pay | Admitting: Cardiovascular Disease

## 2017-10-18 ENCOUNTER — Other Ambulatory Visit: Payer: Self-pay | Admitting: Internal Medicine

## 2017-10-18 ENCOUNTER — Ambulatory Visit (INDEPENDENT_AMBULATORY_CARE_PROVIDER_SITE_OTHER): Payer: PPO | Admitting: Cardiovascular Disease

## 2017-10-18 ENCOUNTER — Telehealth: Payer: Self-pay | Admitting: Cardiovascular Disease

## 2017-10-18 VITALS — BP 118/68 | HR 89 | Ht 63.0 in | Wt 140.8 lb

## 2017-10-18 DIAGNOSIS — K449 Diaphragmatic hernia without obstruction or gangrene: Secondary | ICD-10-CM

## 2017-10-18 DIAGNOSIS — J449 Chronic obstructive pulmonary disease, unspecified: Secondary | ICD-10-CM | POA: Diagnosis not present

## 2017-10-18 DIAGNOSIS — I25118 Atherosclerotic heart disease of native coronary artery with other forms of angina pectoris: Secondary | ICD-10-CM

## 2017-10-18 DIAGNOSIS — J9621 Acute and chronic respiratory failure with hypoxia: Secondary | ICD-10-CM | POA: Diagnosis not present

## 2017-10-18 DIAGNOSIS — I10 Essential (primary) hypertension: Secondary | ICD-10-CM

## 2017-10-18 DIAGNOSIS — R9431 Abnormal electrocardiogram [ECG] [EKG]: Secondary | ICD-10-CM | POA: Diagnosis not present

## 2017-10-18 DIAGNOSIS — I2510 Atherosclerotic heart disease of native coronary artery without angina pectoris: Secondary | ICD-10-CM | POA: Insufficient documentation

## 2017-10-18 MED ORDER — POTASSIUM CHLORIDE ER 10 MEQ PO TBCR: 10.0000 meq | EXTENDED_RELEASE_TABLET | Freq: Every day | ORAL | 3 refills | Status: DC

## 2017-10-18 NOTE — Patient Instructions (Addendum)
  Medication Instructions:   Take 4 OTC potassium today Then one a day Surgery tomorrow  Talk with Dr. Freddi Starr about chholesterol After 2019 numbers, If total chol >150 You could add zetia 10 mg daily  Labwork:  No new labs needed  Testing/Procedures:  No further testing at this time   Follow-Up: It was a pleasure seeing you in the office today. Please call us if you have new issues that need to be addressed before your next appt.  [phone]  Your physician wants you to follow-up in: 12 months as needed.  You will receive a reminder letter in the mail two months in advance. If you don't receive a letter, please call our office to schedule the follow-up appointment.  If you need a refill on your cardiac medications before your next appointment, please call your pharmacy.  For educational health videos Log in to : [email or web address] Or : [email or web address], password : [email or web address]

## 2017-10-18 NOTE — Pre-Procedure Instructions (Signed)
CLEARANCED BY DR Rockey Situ ACCEPTABLE RISK IN Epic NOTES

## 2017-10-18 NOTE — Telephone Encounter (Signed)
Patient seen in office today by Dr Rockey Situ. Clearance listed by Dr Rockey Situ in office visit note from today. Note faxed to [facility] via fax through [facility].

## 2017-10-18 NOTE — Telephone Encounter (Signed)
   [facility] Pre-operative Risk Assessment    Request for surgical clearance:  1. What type of surgery is being performed? laparosconic nissen fundo9plication  When is this surgery scheduled? 10/19/2017  2. What type of clearance is required (medical clearance vs. Pharmacy clearance to hold med vs. Both)? Not listes  3. Are there any medications that need to be held prior to surgery and how long? Not listed  4. Practice name and name of physician performing surgery? [facility]  5. What is your office phone number [phone]   7.   What is your office fax number [phone]  8.   Anesthesia type (None, local, MAC, general) ? Not listed   Lucienne Minks 10/18/2017, 8:56 AM  _________________________________________________________________   (provider comments below)

## 2017-10-19 ENCOUNTER — Encounter
Admission: RE | Disposition: A | Discharge status: Home / Self Care | Payer: Self-pay | Source: Ambulatory Visit | Attending: Surgery

## 2017-10-19 ENCOUNTER — Observation Stay
Admission: RE | Admit: 2017-10-19 | Discharge: 2017-10-21 | Disposition: A | Discharge status: Home / Self Care | Payer: PPO | Source: Ambulatory Visit | Attending: Surgery | Admitting: Surgery

## 2017-10-19 ENCOUNTER — Inpatient Hospital Stay: Payer: PPO | Admitting: Certified Registered"

## 2017-10-19 ENCOUNTER — Other Ambulatory Visit: Payer: Self-pay

## 2017-10-19 ENCOUNTER — Encounter: Payer: Self-pay | Admitting: *Deleted

## 2017-10-19 DIAGNOSIS — I2510 Atherosclerotic heart disease of native coronary artery without angina pectoris: Secondary | ICD-10-CM | POA: Diagnosis not present

## 2017-10-19 DIAGNOSIS — I10 Essential (primary) hypertension: Secondary | ICD-10-CM | POA: Insufficient documentation

## 2017-10-19 DIAGNOSIS — K210 Gastro-esophageal reflux disease with esophagitis: Secondary | ICD-10-CM | POA: Diagnosis not present

## 2017-10-19 DIAGNOSIS — E785 Hyperlipidemia, unspecified: Secondary | ICD-10-CM | POA: Diagnosis not present

## 2017-10-19 DIAGNOSIS — K449 Diaphragmatic hernia without obstruction or gangrene: Secondary | ICD-10-CM | POA: Insufficient documentation

## 2017-10-19 DIAGNOSIS — Z931 Gastrostomy status: Secondary | ICD-10-CM

## 2017-10-19 DIAGNOSIS — Z87891 Personal history of nicotine dependence: Secondary | ICD-10-CM | POA: Insufficient documentation

## 2017-10-19 DIAGNOSIS — J439 Emphysema, unspecified: Secondary | ICD-10-CM | POA: Insufficient documentation

## 2017-10-19 DIAGNOSIS — Z9981 Dependence on supplemental oxygen: Secondary | ICD-10-CM | POA: Insufficient documentation

## 2017-10-19 HISTORY — PX: NISSEN FUNDOPLICATION: SHX2091

## 2017-10-19 HISTORY — PX: LAPAROSCOPIC NISSEN FUNDOPLICATION: SHX1932

## 2017-10-19 LAB — POCT I-STAT 4, (NA,K, GLUC, HGB,HCT)
GLUCOSE: 102 mg/dL — AB (ref 70–99)
HCT: 41 % (ref 36.0–46.0)
Hemoglobin: 13.9 g/dL (ref 12.0–15.0)
Potassium: 3.1 mmol/L — ABNORMAL LOW (ref 3.5–5.1)
Sodium: 135 mmol/L (ref 135–145)

## 2017-10-19 SURGERY — FUNDOPLICATION, NISSEN, LAPAROSCOPIC
Anesthesia: General | Wound class: Clean

## 2017-10-19 MED ORDER — GABAPENTIN 300 MG PO CAPS
ORAL_CAPSULE | ORAL | Status: AC
  Administered 2017-10-19: 15:00:00
  Filled 2017-10-19: qty 1

## 2017-10-19 MED ORDER — CEFAZOLIN SODIUM-DEXTROSE 2-4 GM/100ML-% IV SOLN
INTRAVENOUS | Status: AC
  Filled 2017-10-19: qty 100

## 2017-10-19 MED ORDER — LACTATED RINGERS IV SOLN
INTRAVENOUS | Status: DC
  Administered 2017-10-19: 09:00:00 via INTRAVENOUS

## 2017-10-19 MED ORDER — FENTANYL CITRATE (PF) 100 MCG/2ML IJ SOLN
INTRAMUSCULAR | Status: AC
  Filled 2017-10-19: qty 2

## 2017-10-19 MED ORDER — ACETAMINOPHEN 500 MG PO TABS
ORAL_TABLET | ORAL | Status: AC
  Administered 2017-10-19: 500 mg
  Filled 2017-10-19: qty 2

## 2017-10-19 MED ORDER — CEFAZOLIN SODIUM-DEXTROSE 2-4 GM/100ML-% IV SOLN
2.0000 g | INTRAVENOUS | Status: AC
  Administered 2017-10-19: 2 g via INTRAVENOUS

## 2017-10-19 MED ORDER — CELECOXIB 200 MG PO CAPS
200.0000 mg | ORAL_CAPSULE | ORAL | Status: AC
  Administered 2017-10-19: 200 mg via ORAL

## 2017-10-19 MED ORDER — ALBUTEROL SULFATE HFA 108 (90 BASE) MCG/ACT IN AERS: 2.0000 | INHALATION_SPRAY | Freq: Four times a day (QID) | RESPIRATORY_TRACT | Status: DC | PRN

## 2017-10-19 MED ORDER — PROPOFOL 10 MG/ML IV BOLUS
INTRAVENOUS | Status: DC | PRN
  Administered 2017-10-19: 100 mg via INTRAVENOUS

## 2017-10-19 MED ORDER — ROCURONIUM BROMIDE 50 MG/5ML IV SOLN
INTRAVENOUS | Status: AC
  Filled 2017-10-19: qty 1

## 2017-10-19 MED ORDER — FENTANYL CITRATE (PF) 100 MCG/2ML IJ SOLN
INTRAMUSCULAR | Status: AC
  Administered 2017-10-19: 25 ug via INTRAVENOUS
  Filled 2017-10-19: qty 2

## 2017-10-19 MED ORDER — ONDANSETRON HCL 4 MG/2ML IJ SOLN
INTRAMUSCULAR | Status: AC
  Filled 2017-10-19: qty 2

## 2017-10-19 MED ORDER — FAMOTIDINE IN NACL 20-0.9 MG/50ML-% IV SOLN
20.0000 mg | Freq: Two times a day (BID) | INTRAVENOUS | Status: DC
  Administered 2017-10-19 – 2017-10-20 (×3): 20 mg via INTRAVENOUS
  Filled 2017-10-19 (×3): qty 50

## 2017-10-19 MED ORDER — ONDANSETRON HCL 4 MG/2ML IJ SOLN
4.0000 mg | Freq: Four times a day (QID) | INTRAMUSCULAR | Status: DC | PRN
  Filled 2017-10-19: qty 2

## 2017-10-19 MED ORDER — SUGAMMADEX SODIUM 200 MG/2ML IV SOLN
INTRAVENOUS | Status: DC | PRN
  Administered 2017-10-19: 150 mg via INTRAVENOUS

## 2017-10-19 MED ORDER — LIDOCAINE HCL (CARDIAC) PF 100 MG/5ML IV SOSY
PREFILLED_SYRINGE | INTRAVENOUS | Status: DC | PRN
  Administered 2017-10-19: 80 mg via INTRAVENOUS

## 2017-10-19 MED ORDER — PHENYLEPHRINE HCL 10 MG/ML IJ SOLN
INTRAMUSCULAR | Status: DC | PRN
  Administered 2017-10-19: 20 ug/min via INTRAVENOUS

## 2017-10-19 MED ORDER — PROPOFOL 10 MG/ML IV BOLUS
INTRAVENOUS | Status: AC
  Filled 2017-10-19: qty 20

## 2017-10-19 MED ORDER — BUPIVACAINE-EPINEPHRINE 0.25% -1:200000 IJ SOLN
INTRAMUSCULAR | Status: DC | PRN
  Administered 2017-10-19: 30 mL

## 2017-10-19 MED ORDER — FENTANYL CITRATE (PF) 100 MCG/2ML IJ SOLN
25.0000 ug | INTRAMUSCULAR | Status: DC | PRN
  Administered 2017-10-19 (×4): 25 ug via INTRAVENOUS

## 2017-10-19 MED ORDER — ACETAMINOPHEN 500 MG PO TABS
1000.0000 mg | ORAL_TABLET | ORAL | Status: AC
  Administered 2017-10-19: 1000 mg via ORAL

## 2017-10-19 MED ORDER — OXYCODONE HCL 5 MG PO TABS
5.0000 mg | ORAL_TABLET | ORAL | Status: DC | PRN
  Administered 2017-10-20: 5 mg via ORAL
  Filled 2017-10-19: qty 1

## 2017-10-19 MED ORDER — IPRATROPIUM-ALBUTEROL 0.5-2.5 (3) MG/3ML IN SOLN
3.0000 mL | Freq: Once | RESPIRATORY_TRACT | Status: AC
  Administered 2017-10-19 (×2): 3 mL via RESPIRATORY_TRACT

## 2017-10-19 MED ORDER — ONDANSETRON HCL 4 MG/2ML IJ SOLN
INTRAMUSCULAR | Status: DC | PRN
  Administered 2017-10-19: 4 mg via INTRAVENOUS

## 2017-10-19 MED ORDER — SUGAMMADEX SODIUM 200 MG/2ML IV SOLN
INTRAVENOUS | Status: AC
  Filled 2017-10-19: qty 2

## 2017-10-19 MED ORDER — LIDOCAINE HCL (PF) 2 % IJ SOLN
INTRAMUSCULAR | Status: AC
  Filled 2017-10-19: qty 10

## 2017-10-19 MED ORDER — ONDANSETRON 4 MG PO TBDP: 4.0000 mg | ORAL_TABLET | Freq: Four times a day (QID) | ORAL | Status: DC | PRN

## 2017-10-19 MED ORDER — GABAPENTIN 300 MG PO CAPS
300.0000 mg | ORAL_CAPSULE | ORAL | Status: AC
  Administered 2017-10-19: 300 mg via ORAL

## 2017-10-19 MED ORDER — SUCCINYLCHOLINE CHLORIDE 20 MG/ML IJ SOLN
INTRAMUSCULAR | Status: AC
  Filled 2017-10-19: qty 1

## 2017-10-19 MED ORDER — PHENYLEPHRINE HCL 10 MG/ML IJ SOLN
INTRAMUSCULAR | Status: DC | PRN
  Administered 2017-10-19 (×3): 100 ug via INTRAVENOUS

## 2017-10-19 MED ORDER — TIOTROPIUM BROMIDE MONOHYDRATE 18 MCG IN CAPS
18.0000 ug | ORAL_CAPSULE | Freq: Every day | RESPIRATORY_TRACT | Status: DC
  Administered 2017-10-20 – 2017-10-21 (×2): 18 ug via RESPIRATORY_TRACT
  Filled 2017-10-19: qty 5

## 2017-10-19 MED ORDER — ALBUTEROL SULFATE (2.5 MG/3ML) 0.083% IN NEBU: 2.5000 mg | INHALATION_SOLUTION | Freq: Four times a day (QID) | RESPIRATORY_TRACT | Status: DC | PRN

## 2017-10-19 MED ORDER — FENTANYL CITRATE (PF) 100 MCG/2ML IJ SOLN
INTRAMUSCULAR | Status: DC | PRN
  Administered 2017-10-19: 50 ug via INTRAVENOUS
  Administered 2017-10-19: 100 ug via INTRAVENOUS

## 2017-10-19 MED ORDER — KETOROLAC TROMETHAMINE 15 MG/ML IJ SOLN
15.0000 mg | Freq: Four times a day (QID) | INTRAMUSCULAR | Status: DC
  Administered 2017-10-19 – 2017-10-21 (×8): 15 mg via INTRAVENOUS
  Filled 2017-10-19 (×8): qty 1

## 2017-10-19 MED ORDER — ACETAMINOPHEN 500 MG PO TABS
1000.0000 mg | ORAL_TABLET | Freq: Four times a day (QID) | ORAL | Status: DC
  Administered 2017-10-19 – 2017-10-21 (×6): 1000 mg via ORAL
  Filled 2017-10-19 (×7): qty 2

## 2017-10-19 MED ORDER — CELECOXIB 200 MG PO CAPS
ORAL_CAPSULE | ORAL | Status: AC
  Administered 2017-10-19: 15:00:00
  Filled 2017-10-19: qty 1

## 2017-10-19 MED ORDER — DEXAMETHASONE SODIUM PHOSPHATE 10 MG/ML IJ SOLN
INTRAMUSCULAR | Status: AC
  Filled 2017-10-19: qty 1

## 2017-10-19 MED ORDER — PROCHLORPERAZINE MALEATE 10 MG PO TABS
10.0000 mg | ORAL_TABLET | Freq: Four times a day (QID) | ORAL | Status: DC | PRN
  Filled 2017-10-19: qty 1

## 2017-10-19 MED ORDER — SUCCINYLCHOLINE CHLORIDE 20 MG/ML IJ SOLN
INTRAMUSCULAR | Status: DC | PRN
  Administered 2017-10-19: 80 mg via INTRAVENOUS

## 2017-10-19 MED ORDER — BUPIVACAINE LIPOSOME 1.3 % IJ SUSP
20.0000 mL | Freq: Once | INTRAMUSCULAR | Status: DC
  Filled 2017-10-19: qty 20

## 2017-10-19 MED ORDER — BUPIVACAINE-EPINEPHRINE (PF) 0.25% -1:200000 IJ SOLN
INTRAMUSCULAR | Status: AC
  Filled 2017-10-19: qty 30

## 2017-10-19 MED ORDER — ROCURONIUM BROMIDE 100 MG/10ML IV SOLN
INTRAVENOUS | Status: DC | PRN
  Administered 2017-10-19: 10 mg via INTRAVENOUS
  Administered 2017-10-19: 40 mg via INTRAVENOUS
  Administered 2017-10-19: 10 mg via INTRAVENOUS

## 2017-10-19 MED ORDER — ENOXAPARIN SODIUM 40 MG/0.4ML ~~LOC~~ SOLN
40.0000 mg | SUBCUTANEOUS | Status: DC
  Administered 2017-10-20 – 2017-10-21 (×2): 40 mg via SUBCUTANEOUS
  Filled 2017-10-19 (×2): qty 0.4

## 2017-10-19 MED ORDER — IPRATROPIUM-ALBUTEROL 0.5-2.5 (3) MG/3ML IN SOLN: 3.0000 mL | RESPIRATORY_TRACT | Status: DC

## 2017-10-19 MED ORDER — LACTATED RINGERS IV SOLN
INTRAVENOUS | Status: DC
  Administered 2017-10-19: 14:00:00 via INTRAVENOUS

## 2017-10-19 MED ORDER — DEXAMETHASONE SODIUM PHOSPHATE 10 MG/ML IJ SOLN
INTRAMUSCULAR | Status: DC | PRN
  Administered 2017-10-19: 4 mg via INTRAVENOUS

## 2017-10-19 MED ORDER — HYDRALAZINE HCL 20 MG/ML IJ SOLN: 10.0000 mg | INTRAMUSCULAR | Status: DC | PRN

## 2017-10-19 MED ORDER — MORPHINE SULFATE (PF) 2 MG/ML IV SOLN: 2.0000 mg | INTRAVENOUS | Status: DC | PRN

## 2017-10-19 MED ORDER — MOMETASONE FURO-FORMOTEROL FUM 200-5 MCG/ACT IN AERO
2.0000 | INHALATION_SPRAY | Freq: Two times a day (BID) | RESPIRATORY_TRACT | Status: DC
  Administered 2017-10-19 – 2017-10-21 (×4): 2 via RESPIRATORY_TRACT
  Filled 2017-10-19: qty 8.8

## 2017-10-19 MED ORDER — TRAMADOL HCL 50 MG PO TABS: 50.0000 mg | ORAL_TABLET | Freq: Two times a day (BID) | ORAL | Status: DC | PRN

## 2017-10-19 MED ORDER — IPRATROPIUM-ALBUTEROL 0.5-2.5 (3) MG/3ML IN SOLN
RESPIRATORY_TRACT | Status: AC
  Administered 2017-10-19: 3 mL via RESPIRATORY_TRACT
  Filled 2017-10-19: qty 3

## 2017-10-19 MED ORDER — PROCHLORPERAZINE EDISYLATE 10 MG/2ML IJ SOLN
5.0000 mg | Freq: Four times a day (QID) | INTRAMUSCULAR | Status: DC | PRN
  Filled 2017-10-19: qty 2

## 2017-10-19 MED ORDER — ONDANSETRON HCL 4 MG/2ML IJ SOLN: 4.0000 mg | Freq: Once | INTRAMUSCULAR | Status: DC | PRN

## 2017-10-19 SURGICAL SUPPLY — 60 items
APPLIER CLIP 11 MED OPEN (CLIP) ×3
APPLIER CLIP 13 LRG OPEN (CLIP) ×3
APPLIER CLIP 5 13 M/L LIGAMAX5 (MISCELLANEOUS)
BLADE SURG 15 STRL LF DISP TIS (BLADE) ×1 IMPLANT
BLADE SURG 15 STRL SS (BLADE) ×2
CANISTER SUCT 1200ML W/VALVE (MISCELLANEOUS) ×3 IMPLANT
CHLORAPREP W/TINT 26ML (MISCELLANEOUS) ×3 IMPLANT
CLEANER CAUTERY TIP 5X5 PAD (MISCELLANEOUS) ×1 IMPLANT
CLIP APPLIE 11 MED OPEN (CLIP) ×1 IMPLANT
CLIP APPLIE 13 LRG OPEN (CLIP) ×1 IMPLANT
CLIP APPLIE 5 13 M/L LIGAMAX5 (MISCELLANEOUS) IMPLANT
DECANTER SPIKE VIAL GLASS SM (MISCELLANEOUS) ×3 IMPLANT
DEFOGGER SCOPE WARMER CLEARIFY (MISCELLANEOUS) ×3 IMPLANT
DERMABOND ADVANCED (GAUZE/BANDAGES/DRESSINGS) ×2
DERMABOND ADVANCED .7 DNX12 (GAUZE/BANDAGES/DRESSINGS) ×1 IMPLANT
DEVICE TROCAR PUNCTURE CLOSURE (ENDOMECHANICALS) IMPLANT
DRAIN PENROSE 5/8X18 LTX STRL (WOUND CARE) ×3 IMPLANT
DRAPE LAPAROTOMY 100X77 ABD (DRAPES) ×3 IMPLANT
DRAPE LEGGINS SURG 28X43 STRL (DRAPES) ×3 IMPLANT
DRAPE TABLE BACK 80X90 (DRAPES) ×3 IMPLANT
DRAPE UNDER BUTTOCK W/FLU (DRAPES) ×3 IMPLANT
ELECT BLADE 6.5 EXT (BLADE) ×3 IMPLANT
ELECT REM PT RETURN 9FT ADLT (ELECTROSURGICAL) ×3
ELECTRODE REM PT RTRN 9FT ADLT (ELECTROSURGICAL) ×1 IMPLANT
ENDOSLIDE KNOT PUSHER 174510 (MISCELLANEOUS) ×3 IMPLANT
GAUZE SPONGE 4X4 12PLY STRL (GAUZE/BANDAGES/DRESSINGS) ×3 IMPLANT
GLOVE BIO SURGEON STRL SZ7 (GLOVE) ×3 IMPLANT
GOWN STRL REUS W/ TWL LRG LVL3 (GOWN DISPOSABLE) ×4 IMPLANT
GOWN STRL REUS W/TWL LRG LVL3 (GOWN DISPOSABLE) ×8
IRRIGATION STRYKERFLOW (MISCELLANEOUS) ×1 IMPLANT
IRRIGATOR STRYKERFLOW (MISCELLANEOUS) ×3
IV NS 1000ML (IV SOLUTION) ×2
IV NS 1000ML BAXH (IV SOLUTION) ×1 IMPLANT
KIT PINK PAD W/HEAD ARE REST (MISCELLANEOUS) ×3
KIT PINK PAD W/HEAD ARM REST (MISCELLANEOUS) ×1 IMPLANT
L-HOOK LAP DISP 36CM (ELECTROSURGICAL) ×3
LHOOK LAP DISP 36CM (ELECTROSURGICAL) ×1 IMPLANT
NEEDLE HYPO 22GX1.5 SAFETY (NEEDLE) ×3 IMPLANT
NS IRRIG 1000ML POUR BTL (IV SOLUTION) ×3 IMPLANT
PACK BASIN MAJOR ARMC (MISCELLANEOUS) ×3 IMPLANT
PACK LAP CHOLECYSTECTOMY (MISCELLANEOUS) ×3 IMPLANT
PAD CLEANER CAUTERY TIP 5X5 (MISCELLANEOUS) ×2
PENCIL ELECTRO HAND CTR (MISCELLANEOUS) ×3 IMPLANT
SCISSORS METZENBAUM CVD 33 (INSTRUMENTS) ×3 IMPLANT
SHEARS HARMONIC ACE PLUS 36CM (ENDOMECHANICALS) ×3 IMPLANT
SLEEVE ENDOPATH XCEL 5M (ENDOMECHANICALS) ×9 IMPLANT
SOL PREP PVP 2OZ (MISCELLANEOUS) ×3
SOLUTION PREP PVP 2OZ (MISCELLANEOUS) ×1 IMPLANT
SPONGE LAP 18X18 RF (DISPOSABLE) ×3 IMPLANT
STAPLER SKIN PROX 35W (STAPLE) ×3 IMPLANT
SUT ETHIBOND 0 36 GRN (SUTURE) IMPLANT
SUT MNCRL AB 4-0 PS2 18 (SUTURE) ×6 IMPLANT
SUT PDS AB 1 CT  36 (SUTURE) ×2
SUT PDS AB 1 CT 36 (SUTURE) ×1 IMPLANT
SUT VIC AB 0 CT2 27 (SUTURE) IMPLANT
SUT VICRYL 0 AB UR-6 (SUTURE) ×6 IMPLANT
TRAY FOLEY MTR SLVR 16FR STAT (SET/KITS/TRAYS/PACK) ×3 IMPLANT
TROCAR XCEL BLUNT TIP 100MML (ENDOMECHANICALS) ×3 IMPLANT
TROCAR XCEL NON-BLD 5MMX100MML (ENDOMECHANICALS) ×3 IMPLANT
TUBING INSUFFLATION (TUBING) ×3 IMPLANT

## 2017-10-19 NOTE — Progress Notes (Signed)
Pt O2 sats maintaining in between 93% to 88% on 2 liters oxygen via nasal cannula. Dr. Rosey Bath notified. Acknowledged. Orders received. Josslin Sanjuan E 12:50 PM 10/19/2017

## 2017-10-19 NOTE — Op Note (Addendum)
Laparoscopic Nissen Fundoplication  Pre-operative Diagnosis: Recalcitrant GERD  Post-operative Diagnosis: Same  Procedure: LaparoScopic Nissen fundoplication  Surgeon: Caroleen Hamman, MD FACS  Anesthesia: Gen. with endotracheal tube  Assistant:Dr. Oaks (needed due to the complexity of the case on for exposure)   Findings: Small hiatal hernia. Nice floppy wrap over a 52 bougie dilator  Estimated Blood Loss: 10cc         Drains: none                  Complications: none   Procedure Details  The patient was seen again in the Holding Room. The benefits, complications, treatment options, and expected outcomes were discussed with the patient. The risks of bleeding, infection, recurrence of symptoms, failure to resolve symptoms,The likelihood of improving the patient's symptoms with return to their baseline status is good.  The patient and/or family concurred with the proposed plan, giving informed consent.  The patient was taken to Operating Room, identified as Joyce Rodriguez and the procedure verified as Laparoscopic fundoplication  A Time Out was held and the above information confirmed.  Prior to the induction of general anesthesia, antibiotic prophylaxis was administered. VTE prophylaxis was in place. General endotracheal anesthesia was then administered and tolerated well. After the induction, the abdomen was prepped with Chloraprep and draped in the sterile fashion. The patient was positioned in the supine position.  Cut down technique was used to enter the abdominal cavity and a Hasson trochar was placed after two vicryl stitches were anchored to the fascia. Pneumoperitoneum was then created with CO2 and tolerated well without any adverse changes in the patient's vital signs.  Two 5-mm ports and one additional `12 mmwere placed  under direct vision. All skin incisions  were infiltrated with a local anesthetic agent before making the incision and placing the trocars.   The patient was  positioned  in reverse Trendelenburg,   Laparoscopic the retractor was placed in the standard fashion and secured.  Using the pars flaccida approach with the Harmonic a scalpel we divided the proximal lesser curvature in the standard fashion.  We wish to do right crew and proceeded our dissection circumferentially and divided the phrenoesophageal ligament in the standard fashion.  We then turned to attention to the greater curvature where the short gastrics were divided with Harmonic Scalpel in standard fashion and we reached the phrenoesophageal ligament on the left side and we identified the left screw as well.  We worked very careful not to injure the hilum of the spleen. Continue our dissection circumferentially and reduce the small hiatal hernia in the standard fashion.   The vagal nerves were identified and protected at all times Once we have an adequate circumferential dissection were able to approximate the left on right crew to close the hiatal defect.  This was done with 2 interrupted 2-0 silk sutures in an intracorporeal fashion. A 52 French bougie was placed by anesthesia and was visualized at all times.  A 360 degree Nissen wrap was performed using 3 interrupted 2-0 silk sutures.  Check the wrap and he was floppy but it was more than adequate.  There was no evidence of any tension on the hemostasis is was adequately.    No bleeding  or bowel injury was noted. Pneumoperitoneum was released.  The periumbilical port site was closed with interrumpted 0 Vicryl sutures. 4-0 subcuticular Monocryl was used to close the skin. Dermabond was  applied.  The patient was then extubated and brought to the recovery room  in stable condition. Sponge, lap, and needle counts were correct at closure and at the conclusion of the case.               Caroleen Hamman, MD, FACS

## 2017-10-19 NOTE — Anesthesia Procedure Notes (Signed)
Procedure Name: Intubation Performed by: Lance Muss, CRNA Pre-anesthesia Checklist: Patient identified, Patient being monitored, Timeout performed, Emergency Drugs available and Suction available Patient Re-evaluated:Patient Re-evaluated prior to induction Oxygen Delivery Method: Circle system utilized Preoxygenation: Pre-oxygenation with 100% oxygen Induction Type: IV induction, Cricoid Pressure applied and Rapid sequence Laryngoscope Size: Mac and 3 Grade View: Grade I Tube type: Oral Tube size: 7.0 mm Number of attempts: 1 Airway Equipment and Method: Stylet Placement Confirmation: ETT inserted through vocal cords under direct vision,  positive ETCO2 and breath sounds checked- equal and bilateral Secured at: 21 cm Tube secured with: Tape Dental Injury: Teeth and Oropharynx as per pre-operative assessment

## 2017-10-19 NOTE — Interval H&P Note (Signed)
History and Physical Interval Note:  10/19/2017 8:25 AM  Joyce Rodriguez  has presented today for surgery, with the diagnosis of gastroesophageal reflux disease with esophagitis  The various methods of treatment have been discussed with the patient and family. After consideration of risks, benefits and other options for treatment, the patient has consented to  Procedure(s): LAPAROSCOPIC NISSEN FUNDOPLICATION (N/A) NISSEN FUNDOPLICATION (N/A) as a surgical intervention .  The patient's history has been reviewed, patient examined, no change in status, stable for surgery.  I have reviewed the patient's chart and labs.  Questions were answered to the patient's satisfaction.     [place]

## 2017-10-19 NOTE — Transfer of Care (Signed)
Immediate Anesthesia Transfer of Care Note  Patient: Joyce Rodriguez  Procedure(s) Performed: LAPAROSCOPIC NISSEN FUNDOPLICATION (N/A ) NISSEN FUNDOPLICATION (N/A )  Patient Location: PACU  Anesthesia Type:General  Level of Consciousness: awake and alert   Airway & Oxygen Therapy: Patient Spontanous Breathing and Patient connected to face mask oxygen  Post-op Assessment: Report given to RN and Post -op Vital signs reviewed and stable  Post vital signs: Reviewed and stable  Last Vitals:  Vitals Value Taken Time  BP 136/87 10/19/2017 12:08 PM  Temp 36.4 C 10/19/2017 12:08 PM  Pulse 118 10/19/2017 12:08 PM  Resp 20 10/19/2017 12:08 PM  SpO2 97 % 10/19/2017 12:08 PM  Vitals shown include unvalidated device data.  Last Pain:  Vitals:   10/19/17 0819  TempSrc: Tympanic  PainSc: 0-No pain         Complications: No apparent anesthesia complications

## 2017-10-19 NOTE — Anesthesia Preprocedure Evaluation (Addendum)
Anesthesia Evaluation  Patient identified by MRN, date of birth, ID band Patient awake    Reviewed: Allergy & Precautions, H&P , NPO status , Patient's Chart, lab work & pertinent test results, reviewed documented beta blocker date and time   History of Anesthesia Complications (+) history of anesthetic complications  Airway Mallampati: III  TM Distance: >3 FB Neck ROM: full    Dental  (+) Dental Advidsory Given, Partial Upper, Missing, Teeth Intact   Pulmonary shortness of breath and with exertion, neg sleep apnea, COPD,  COPD inhaler and oxygen dependent, neg recent URI, former smoker,           Cardiovascular Exercise Tolerance: Good hypertension, (-) angina+ CAD  (-) Past MI, (-) Cardiac Stents and (-) CABG negative cardio ROS  (-) dysrhythmias (-) Valvular Problems/Murmurs     Neuro/Psych PSYCHIATRIC DISORDERS Depression negative neurological ROS     GI/Hepatic Neg liver ROS, hiatal hernia, PUD, GERD  ,  Endo/Other  negative endocrine ROS  Renal/GU negative Renal ROS  negative genitourinary   Musculoskeletal   Abdominal   Peds  Hematology negative hematology ROS (+)   Anesthesia Other Findings Past Medical History: No date: Arthritis No date: Complication of anesthesia     Comment:  difficulty waking up No date: COPD (chronic obstructive pulmonary disease) (HCC) No date: Dyspnea No date: Emphysema of lung (HCC) No date: GERD (gastroesophageal reflux disease) No date: Hyperlipidemia No date: Hypertension No date: Osteoporosis No date: Oxygen deficiency     Comment:  uses O2 at night   Reproductive/Obstetrics negative OB ROS                            Anesthesia Physical Anesthesia Plan  ASA: IV  Anesthesia Plan: General   Post-op Pain Management:    Induction: Intravenous  PONV Risk Score and Plan: 3 and Ondansetron, Dexamethasone and Treatment may vary due to age or  medical condition  Airway Management Planned: Oral ETT  Additional Equipment:   Intra-op Plan:   Post-operative Plan: Extubation in OR  Informed Consent: I have reviewed the patients History and Physical, chart, labs and discussed the procedure including the risks, benefits and alternatives for the proposed anesthesia with the patient or authorized representative who has indicated his/her understanding and acceptance.   Dental Advisory Given  Plan Discussed with: Anesthesiologist, CRNA and Surgeon  Anesthesia Plan Comments:       Anesthesia Quick Evaluation

## 2017-10-19 NOTE — Anesthesia Post-op Follow-up Note (Signed)
Anesthesia QCDR form completed.        

## 2017-10-20 ENCOUNTER — Ambulatory Visit: Payer: PPO | Admitting: Family Medicine

## 2017-10-20 DIAGNOSIS — K210 Gastro-esophageal reflux disease with esophagitis: Secondary | ICD-10-CM | POA: Diagnosis not present

## 2017-10-20 LAB — CBC
HEMATOCRIT: 32.6 % — AB (ref 35.0–47.0)
HEMOGLOBIN: 11.2 g/dL — AB (ref 12.0–16.0)
MCH: 31.5 pg (ref 26.0–34.0)
MCHC: 34.5 g/dL (ref 32.0–36.0)
MCV: 91.1 fL (ref 80.0–100.0)
PLATELETS: 249 10*3/uL (ref 150–440)
RBC: 3.57 MIL/uL — ABNORMAL LOW (ref 3.80–5.20)
RDW: 13.4 % (ref 11.5–14.5)
WBC: 8.7 10*3/uL (ref 3.6–11.0)

## 2017-10-20 LAB — BASIC METABOLIC PANEL
ANION GAP: 6 (ref 5–15)
BUN: 20 mg/dL (ref 8–23)
CHLORIDE: 93 mmol/L — AB (ref 98–111)
CO2: 32 mmol/L (ref 22–32)
CREATININE: 0.76 mg/dL (ref 0.44–1.00)
Calcium: 8.8 mg/dL — ABNORMAL LOW (ref 8.9–10.3)
GFR calc Af Amer: >60 mL/min (ref >60)
GFR calc non Af Amer: >60 mL/min (ref >60)
Glucose, Bld: 94 mg/dL (ref 70–99)
POTASSIUM: 3.5 mmol/L (ref 3.5–5.1)
Sodium: 131 mmol/L — ABNORMAL LOW (ref 135–145)

## 2017-10-20 NOTE — Progress Notes (Signed)
POD # 1 Doing very well Minimal pain AVSS No reflux  PE NAD Abd: soft, minimal incisional tenderness, no peritonitis  A/P Doing well Full liquids and soft diet Mobilize DC in am

## 2017-10-21 DIAGNOSIS — K210 Gastro-esophageal reflux disease with esophagitis: Secondary | ICD-10-CM | POA: Diagnosis not present

## 2017-10-21 LAB — HIV ANTIBODY (ROUTINE TESTING W REFLEX): HIV Screen 4th Generation wRfx: NONREACTIVE

## 2017-10-21 MED ORDER — HYDROCODONE-ACETAMINOPHEN 5-325 MG PO TABS: 1.0000 | ORAL_TABLET | ORAL | 0 refills | Status: DC | PRN

## 2017-10-21 NOTE — Progress Notes (Signed)
Harl Bowie  A and O x 4. VSS. Pt tolerating diet well. No complaints of pain or nausea. IV removed intact, prescriptions given. Pt voiced understanding of discharge instructions with no further questions. Pt discharged via wheelchair with axillary.    Allergies as of 10/21/2017      Reactions   Codeine Nausea And Vomiting   Pt states that she can tolerate Tramadol fine.      Medication List    STOP taking these medications   esomeprazole 40 MG capsule Commonly known as:  [place] these medications   ADVAIR DISKUS 250-50 MCG/DOSE Aepb Generic drug:  Fluticasone-Salmeterol Inhale 1 puff into the lungs 2 (two) times daily.   cholecalciferol 1000 units tablet Commonly known as:  VITAMIN D Take 1,000 Units by mouth daily.   escitalopram 10 MG tablet Commonly known as:  LEXAPRO Take 1 tablet (10 mg total) by mouth daily.   GOODY HEADACHE PO Take 1 packet by mouth daily as needed (headaches).   hydrochlorothiazide 25 MG tablet Commonly known as:  HYDRODIURIL Take 1 tablet (25 mg total) by mouth daily.   HYDROcodone-acetaminophen 5-325 MG tablet Commonly known as:  NORCO/VICODIN Take 1-2 tablets by mouth every 4 (four) hours as needed for moderate pain.   multivitamin with minerals Tabs tablet Take 1 tablet by mouth daily.   OXYGEN Inhale into the lungs.   potassium chloride 10 MEQ tablet Commonly known as:  K-DUR Take 1 tablet (10 mEq total) by mouth daily.   PROAIR HFA 108 (90 Base) MCG/ACT inhaler Generic drug:  albuterol INHALE 2 PUFFS BY MOUTH EVERY 6 HOURS AS NEEDED What changed:    how much to take  how to take this  when to take this   rosuvastatin 20 MG tablet Commonly known as:  CRESTOR Take 1 tablet (20 mg total) by mouth daily.   SPIRIVA HANDIHALER 18 MCG inhalation capsule Generic drug:  tiotropium INHALE THE CONTENTS OF 1 CAPSULE VIA INHALATION DEVICE EVERY DAY   traMADol 50 MG tablet Commonly known as:  ULTRAM Take 1 tablet (50  mg total) by mouth every 12 (twelve) hours as needed. for pain       Vitals:   10/20/17 2007 10/21/17 0519  BP: (!) 105/59 122/74  Pulse: 89 77  Resp: 20 20  Temp: 98.2 F (36.8 C) 98.1 F (36.7 C)  SpO2: 96% 95%    Francesco Sor

## 2017-10-21 NOTE — Discharge Instructions (Signed)
Laparoscopic Nissen Fundoplication, Care After °Refer to this sheet in the next few weeks. These instructions provide you with information about caring for yourself after your procedure. Your health care provider may also give you more specific instructions. Your treatment has been planned according to current medical practices, but problems sometimes occur. Call your health care provider if you have any problems or questions after your procedure. °What can I expect after the procedure? °After the procedure, it is common to have: °· Difficulty swallowing (dysphagia). °· Excess gas (bloating). ° °Follow these instructions at home: °Medicines °· Take medicines only as directed by your health care provider. °· Do not drive or operate heavy machinery while taking pain medicine. °Incision care °· There are many different ways to close and cover an incision, including stitches (sutures), skin glue, and adhesive strips. Follow your health care provider's instructions about: °? Incision care. °? Bandage (dressing) changes and removal. °? Incision closure removal. °· Check your incision areas every day for signs of infection. Watch for: °? Redness, swelling, or pain. °? Fluid, blood, or pus. °· Do not take baths, swim, or use a hot tub until your health care provider approves. Take showers as directed by your health care provider. °Eating and drinking °· Follow your health care provider's instructions about eating. °? You may need to follow a liquid-only diet for 2 weeks, followed by a diet of soft foods for 2 weeks. °? You should return to your usual diet gradually. °· Drink enough fluid to keep your urine clear or pale yellow. °Activity °· Return to your normal activities as directed by your health care provider. Ask your health care provider what activities are safe for you. °· Avoid strenuous exercise. °· Do not lift anything that is heavier than 10 lb (4.5 kg). °· Ask your health care provider when you can: °? Return to  sexual activity. °? Drive. °? Go back to work. °Contact a health care provider if: °· You have a fever. °· Your pain gets worse or is not helped by medicine. °· You have frequent nausea or vomiting. °· You have continued abdominal bloating. °· You have an ongoing (persistent) cough. °· You have redness, swelling, or pain in any incision areas. °· You have fluid, blood, or pus coming from any incisions. °Get help right away if: °· You have trouble breathing. °· You are unable to swallow. °· You have persistent vomiting. °· You have blood in your vomit. °· You have severe abdominal pain. °This information is not intended to replace advice given to you by your health care provider. Make sure you discuss any questions you have with your health care provider. °Document Released: 11/21/2003 Document Revised: 09/05/2015 Document Reviewed: 11/29/2013 °Elsevier Interactive Patient Education © 2018 [employer] ° °

## 2017-10-21 NOTE — Care Management Obs Status (Signed)
[facility] NOTIFICATION   Patient Details  Name: Mirranda Monrroy MRN: [id number] Date of Birth: 05-27-1945   Medicare Observation Status Notification Given:  Yes    Beverly Sessions, RN 10/21/2017, 10:13 AM

## 2017-10-21 NOTE — Discharge Summary (Signed)
Patient ID: Joyce Rodriguez MRN: [id number] DOB/AGE: [birth year] 72 y.o.  Admit date: 10/19/2017 Discharge date: 10/21/2017   Discharge Diagnoses:  Active Problems:   S/P Nissen fundoplication (with gastrostomy tube placement) [facility])   Procedures:lap Nissen fundoplications  Hospital Course: 72 yo underwent elective scopic Nissen fundoplication.  She had a very good operation and postoperative course.  Given her COPD she was in the hospital for 2 nights.  Her diet was advanced from a clear liquid diet to a full liquid diet which she tolerated very well.  There is no evidence of dysphagia and reflux symptoms were completely gone.  At the time of discharge she was ambulating, tolerating diet having minimal pain in her vital signs were stable and she was afebrile.  Her physical exam at the time of discharge showed a female in no acute distress.  Awake and alert.  Abdomen: Soft minimal incisional tenderness.  No peritonitis no infection.  Extremities well-perfused no edema.  Condition of the patient the time of discharge was stable    Disposition: Discharge disposition: 01-Home or Self Care       Discharge Instructions    Call MD for:  difficulty breathing, headache or visual disturbances   Complete by:  As directed    Call MD for:  extreme fatigue   Complete by:  As directed    Call MD for:  hives   Complete by:  As directed    Call MD for:  persistant dizziness or light-headedness   Complete by:  As directed    Call MD for:  persistant nausea and vomiting   Complete by:  As directed    Call MD for:  redness, tenderness, or signs of infection (pain, swelling, redness, odor or green/yellow discharge around incision site)   Complete by:  As directed    Call MD for:  severe uncontrolled pain   Complete by:  As directed    Call MD for:  temperature >100.4   Complete by:  As directed    Diet general   Complete by:  As directed    Soft diet, may have scrambled eggs , soft cheese, full  liquids , no carbonation. Until F/U appt   Discharge instructions   Complete by:  As directed    May shower today   Increase activity slowly   Complete by:  As directed    Lifting restrictions   Complete by:  As directed    Less 20 lbs x 6 wks     Allergies as of 10/21/2017      Reactions   Codeine Nausea And Vomiting   Pt states that she can tolerate Tramadol fine.      Medication List    STOP taking these medications   esomeprazole 40 MG capsule Commonly known as:  [place] these medications   ADVAIR DISKUS 250-50 MCG/DOSE Aepb Generic drug:  Fluticasone-Salmeterol Inhale 1 puff into the lungs 2 (two) times daily.   cholecalciferol 1000 units tablet Commonly known as:  VITAMIN D Take 1,000 Units by mouth daily.   escitalopram 10 MG tablet Commonly known as:  LEXAPRO Take 1 tablet (10 mg total) by mouth daily.   GOODY HEADACHE PO Take 1 packet by mouth daily as needed (headaches).   hydrochlorothiazide 25 MG tablet Commonly known as:  HYDRODIURIL Take 1 tablet (25 mg total) by mouth daily.   HYDROcodone-acetaminophen 5-325 MG tablet Commonly known as:  NORCO/VICODIN Take 1-2 tablets by mouth every 4 (four)  hours as needed for moderate pain.   multivitamin with minerals Tabs tablet Take 1 tablet by mouth daily.   OXYGEN Inhale into the lungs.   potassium chloride 10 MEQ tablet Commonly known as:  K-DUR Take 1 tablet (10 mEq total) by mouth daily.   PROAIR HFA 108 (90 Base) MCG/ACT inhaler Generic drug:  albuterol INHALE 2 PUFFS BY MOUTH EVERY 6 HOURS AS NEEDED What changed:    how much to take  how to take this  when to take this   rosuvastatin 20 MG tablet Commonly known as:  CRESTOR Take 1 tablet (20 mg total) by mouth daily.   SPIRIVA HANDIHALER 18 MCG inhalation capsule Generic drug:  tiotropium INHALE THE CONTENTS OF 1 CAPSULE VIA INHALATION DEVICE EVERY DAY   traMADol 50 MG tablet Commonly known as:  ULTRAM Take 1 tablet (50  mg total) by mouth every 12 (twelve) hours as needed. for pain      Follow-up Information    Jules Husbands, MD On 11/01/2017.   Specialty:  General Surgery Why:  appointment @ 10am Contact information: [place] [address] [phone]            Caroleen Hamman, MD FACS

## 2017-10-22 ENCOUNTER — Telehealth: Payer: Self-pay | Admitting: Surgery

## 2017-10-22 MED ORDER — ONDANSETRON HCL 4 MG PO TABS: 4.0000 mg | ORAL_TABLET | Freq: Three times a day (TID) | ORAL | 0 refills | Status: DC | PRN

## 2017-10-22 NOTE — Telephone Encounter (Signed)
Called Dr. Dahlia Byes first to let him know how the patient was feeling and what his recommendations were. He stated that the patient needed to stop taking the Hydrocodone-Acetaminophen and to take Ibuprofen 800 MG every 8 hours and Zofran that I will be sending to her pharmacy per Dr. Dahlia Byes.  I then called patient to let her know and she agreed. Prescription will be sent to her pharmacy.

## 2017-10-22 NOTE — Telephone Encounter (Signed)
Patients husband called said patient was prescribe a medication at the hospital with she thinks had codeine in the medication and now the patient is feeling very sick. Patient is feeling very nausea as well, and is asking maybe something else can be sent in. Please call patient and advise.

## 2017-10-23 NOTE — Anesthesia Postprocedure Evaluation (Signed)
Anesthesia Post Note  Patient: Joyce Rodriguez  Procedure(s) Performed: LAPAROSCOPIC NISSEN FUNDOPLICATION (N/A ) NISSEN FUNDOPLICATION (N/A )  Patient location during evaluation: PACU Anesthesia Type: General Level of consciousness: awake and alert Pain management: pain level controlled Vital Signs Assessment: post-procedure vital signs reviewed and stable Respiratory status: spontaneous breathing, nonlabored ventilation, respiratory function stable and patient connected to nasal cannula oxygen Cardiovascular status: blood pressure returned to baseline and stable Postop Assessment: no apparent nausea or vomiting Anesthetic complications: no     Last Vitals:  Vitals:   10/20/17 2007 10/21/17 0519  BP: (!) 105/59 122/74  Pulse: 89 77  Resp: 20 20  Temp: 36.8 C 36.7 C  SpO2: 96% 95%    Last Pain:  Vitals:   10/21/17 0717  TempSrc:   PainSc: 0-No pain                 Martha Clan

## 2017-10-30 DIAGNOSIS — J441 Chronic obstructive pulmonary disease with (acute) exacerbation: Secondary | ICD-10-CM | POA: Diagnosis not present

## 2017-11-01 ENCOUNTER — Ambulatory Visit (INDEPENDENT_AMBULATORY_CARE_PROVIDER_SITE_OTHER): Payer: PPO | Admitting: Surgery

## 2017-11-01 ENCOUNTER — Encounter: Payer: Self-pay | Admitting: Surgery

## 2017-11-01 VITALS — BP 125/74 | HR 106 | Temp 98.0°F | Ht 63.0 in | Wt 137.8 lb

## 2017-11-01 DIAGNOSIS — J441 Chronic obstructive pulmonary disease with (acute) exacerbation: Secondary | ICD-10-CM | POA: Diagnosis not present

## 2017-11-01 DIAGNOSIS — Z09 Encounter for follow-up examination after completed treatment for conditions other than malignant neoplasm: Secondary | ICD-10-CM

## 2017-11-01 DIAGNOSIS — M19042 Primary osteoarthritis, left hand: Secondary | ICD-10-CM

## 2017-11-01 DIAGNOSIS — M19041 Primary osteoarthritis, right hand: Secondary | ICD-10-CM

## 2017-11-01 MED ORDER — METOCLOPRAMIDE HCL 5 MG PO TABS: 5.0000 mg | ORAL_TABLET | Freq: Three times a day (TID) | ORAL | 0 refills | Status: DC | PRN

## 2017-11-01 MED ORDER — TRAMADOL HCL 50 MG PO TABS: 50.0000 mg | ORAL_TABLET | Freq: Two times a day (BID) | ORAL | 0 refills | Status: DC | PRN

## 2017-11-01 NOTE — Progress Notes (Signed)
S/p Lap Nissen, doing very well He c/o some constipation and lower abdominal pain Her reflux is completely gone and she does not have any incisional pain  PE NAD Abd: soft, incisions healing well, no infection, no peritonitis. Suprapubic mild tenderness.  A/P Doing well Constipation related pain Miralax May try some reglan for nausea RTC next week She had severe side effects to norco We will write a script for Tramadol since is the only narcotics that she tolerates

## 2017-11-01 NOTE — Patient Instructions (Signed)
Please take Miralax daily.     Constipation, Adult Constipation is when a person:  Poops (has a bowel movement) fewer times in a week than normal.  Has a hard time pooping.  Has poop that is dry, hard, or bigger than normal.  Follow these instructions at home: Eating and drinking   Eat foods that have a lot of fiber, such as: ? Fresh fruits and vegetables. ? Whole grains. ? Beans.  Eat less of foods that are high in fat, low in fiber, or overly processed, such as: ? [place] fries. ? Hamburgers. ? Cookies. ? Candy. ? Soda.  Drink enough fluid to keep your pee (urine) clear or pale yellow. General instructions  Exercise regularly or as told by your doctor.  Go to the restroom when you feel like you need to poop. Do not hold it in.  Take over-the-counter and prescription medicines only as told by your doctor. These include any fiber supplements.  Do pelvic floor retraining exercises, such as: ? Doing deep breathing while relaxing your lower belly (abdomen). ? Relaxing your pelvic floor while pooping.  Watch your condition for any changes.  Keep all follow-up visits as told by your doctor. This is important. Contact a doctor if:  You have pain that gets worse.  You have a fever.  You have not pooped for 4 days.  You throw up (vomit).  You are not hungry.  You lose weight.  You are bleeding from the anus.  You have thin, pencil-like poop (stool). Get help right away if:  You have a fever, and your symptoms suddenly get worse.  You leak poop or have blood in your poop.  Your belly feels hard or bigger than normal (is bloated).  You have very bad belly pain.  You feel dizzy or you faint. This information is not intended to replace advice given to you by your health care provider. Make sure you discuss any questions you have with your health care provider. Document Released: 09/16/2007 Document Revised: 10/18/2015 Document Reviewed:  09/18/2015 Elsevier Interactive Patient Education  2018 [employer].

## 2017-11-08 ENCOUNTER — Ambulatory Visit: Payer: PPO | Admitting: Gastroenterology

## 2017-11-10 ENCOUNTER — Encounter: Payer: Self-pay | Admitting: Surgery

## 2017-11-10 ENCOUNTER — Ambulatory Visit (INDEPENDENT_AMBULATORY_CARE_PROVIDER_SITE_OTHER): Payer: PPO | Admitting: Surgery

## 2017-11-10 VITALS — BP 123/70 | HR 105 | Temp 98.1°F | Ht 63.0 in | Wt 139.0 lb

## 2017-11-10 DIAGNOSIS — Z09 Encounter for follow-up examination after completed treatment for conditions other than malignant neoplasm: Secondary | ICD-10-CM

## 2017-11-10 NOTE — Progress Notes (Signed)
S/p lap nissen 7/9 Doing much better Constipation improved Doing soft diet, no Dysphagia No reflux + BM  PE NAD ABD: soft, NT, incisions c/d/i, no peritonitis  A/P Doing well No complications Advance diet slowly, avoid bread or steak for another 3 weeks RTC prn

## 2017-11-10 NOTE — Patient Instructions (Signed)
Please call our office if you have questions or concerns.   

## 2017-11-26 ENCOUNTER — Other Ambulatory Visit: Payer: Self-pay | Admitting: Internal Medicine

## 2017-11-30 DIAGNOSIS — J441 Chronic obstructive pulmonary disease with (acute) exacerbation: Secondary | ICD-10-CM | POA: Diagnosis not present

## 2017-12-02 DIAGNOSIS — J441 Chronic obstructive pulmonary disease with (acute) exacerbation: Secondary | ICD-10-CM | POA: Diagnosis not present

## 2017-12-08 ENCOUNTER — Encounter

## 2017-12-08 ENCOUNTER — Ambulatory Visit: Payer: PPO | Admitting: Cardiovascular Disease

## 2017-12-25 ENCOUNTER — Other Ambulatory Visit: Payer: Self-pay | Admitting: Internal Medicine

## 2017-12-31 DIAGNOSIS — M19012 Primary osteoarthritis, left shoulder: Secondary | ICD-10-CM | POA: Diagnosis not present

## 2017-12-31 DIAGNOSIS — J441 Chronic obstructive pulmonary disease with (acute) exacerbation: Secondary | ICD-10-CM | POA: Diagnosis not present

## 2017-12-31 DIAGNOSIS — M542 Cervicalgia: Secondary | ICD-10-CM | POA: Diagnosis not present

## 2017-12-31 DIAGNOSIS — M47812 Spondylosis without myelopathy or radiculopathy, cervical region: Secondary | ICD-10-CM | POA: Diagnosis not present

## 2017-12-31 DIAGNOSIS — M25512 Pain in left shoulder: Secondary | ICD-10-CM | POA: Diagnosis not present

## 2018-01-02 DIAGNOSIS — J441 Chronic obstructive pulmonary disease with (acute) exacerbation: Secondary | ICD-10-CM | POA: Diagnosis not present

## 2018-01-10 ENCOUNTER — Encounter: Payer: Self-pay | Admitting: Internal Medicine

## 2018-01-10 ENCOUNTER — Ambulatory Visit (INDEPENDENT_AMBULATORY_CARE_PROVIDER_SITE_OTHER): Payer: PPO | Admitting: Internal Medicine

## 2018-01-10 VITALS — BP 136/75 | HR 90 | Resp 16 | Ht 63.0 in | Wt 135.4 lb

## 2018-01-10 DIAGNOSIS — F325 Major depressive disorder, single episode, in full remission: Secondary | ICD-10-CM | POA: Diagnosis not present

## 2018-01-10 DIAGNOSIS — I2583 Coronary atherosclerosis due to lipid rich plaque: Secondary | ICD-10-CM | POA: Diagnosis not present

## 2018-01-10 DIAGNOSIS — R0602 Shortness of breath: Secondary | ICD-10-CM | POA: Diagnosis not present

## 2018-01-10 DIAGNOSIS — K219 Gastro-esophageal reflux disease without esophagitis: Secondary | ICD-10-CM

## 2018-01-10 DIAGNOSIS — I2510 Atherosclerotic heart disease of native coronary artery without angina pectoris: Secondary | ICD-10-CM

## 2018-01-10 DIAGNOSIS — J449 Chronic obstructive pulmonary disease, unspecified: Secondary | ICD-10-CM | POA: Diagnosis not present

## 2018-01-10 DIAGNOSIS — Z23 Encounter for immunization: Secondary | ICD-10-CM | POA: Diagnosis not present

## 2018-01-10 MED ORDER — TIOTROPIUM BROMIDE MONOHYDRATE 18 MCG IN CAPS: 18.0000 ug | ORAL_CAPSULE | Freq: Every day | RESPIRATORY_TRACT | 12 refills | Status: DC

## 2018-01-10 MED ORDER — FLUTICASONE-SALMETEROL 250-50 MCG/DOSE IN AEPB: 1.0000 | INHALATION_SPRAY | Freq: Two times a day (BID) | RESPIRATORY_TRACT | 3 refills | Status: DC

## 2018-01-10 NOTE — Progress Notes (Signed)
[facility] [place], [place] [address]  Pulmonary Sleep Medicine   Office Visit Note  Patient Name: Joyce Rodriguez DOB: [birth year] MRN [id number]  Date of Service: 01/10/2018   Complaints/HPI: She is doing well she has had no hospital admissions.  Currently is off of her oxygen.  She uses her oxygen when she exerts herself.  Denies any cough no congestion.  Denies any hemoptysis.  Denies having chest pain she is using her Advair and inhalers and has actually been gaining benefit from the inhalers.  Right now prescriptions were reviewed and given to her today  ROS  General: (-) fever, (-) chills, (-) night sweats, (-) weakness Skin: (-) rashes, (-) itching,. Eyes: (-) visual changes, (-) redness, (-) itching. Nose and Sinuses: (-) nasal stuffiness or itchiness, (-) postnasal drip, (-) nosebleeds, (-) sinus trouble. Mouth and Throat: (-) sore throat, (-) hoarseness. Neck: (-) swollen glands, (-) enlarged thyroid, (-) neck pain. Respiratory: - cough, (-) bloody sputum, + shortness of breath, - wheezing. Cardiovascular: - ankle swelling, (-) chest pain. Lymphatic: (-) lymph node enlargement. Neurologic: (-) numbness, (-) tingling. Psychiatric: (-) anxiety, (-) depression   Current Medication: Outpatient Encounter Medications as of 01/10/2018  Medication Sig  . Aspirin-Acetaminophen-Caffeine (GOODY HEADACHE PO) Take 1 packet by mouth daily as needed (headaches).  . cholecalciferol (VITAMIN D) 1000 units tablet Take 1,000 Units by mouth daily.  Marland Kitchen escitalopram (LEXAPRO) 10 MG tablet Take 1 tablet (10 mg total) by mouth daily.  . Fluticasone-Salmeterol (ADVAIR DISKUS) 250-50 MCG/DOSE AEPB Inhale 1 puff into the lungs 2 (two) times daily.  . hydrochlorothiazide (HYDRODIURIL) 25 MG tablet Take 1 tablet (25 mg total) by mouth daily.  . metoCLOPramide (REGLAN) 5 MG tablet Take 1 tablet (5 mg total) by mouth every 8 (eight) hours as needed for nausea.  . Multiple  Vitamin (MULTIVITAMIN WITH MINERALS) TABS tablet Take 1 tablet by mouth daily.  . ondansetron (ZOFRAN) 4 MG tablet Take 1 tablet (4 mg total) by mouth every 8 (eight) hours as needed for nausea or vomiting.  . OXYGEN Inhale into the lungs.  . potassium chloride (K-DUR) 10 MEQ tablet Take 1 tablet (10 mEq total) by mouth daily.  Marland Kitchen PROAIR HFA 108 (90 Base) MCG/ACT inhaler INHALE 2 PUFFS BY MOUTH EVERY 6 HOURS AS NEEDED (Patient taking differently: INHALE 2 PUFFS BY MOUTH EVERY 6 HOURS AS NEEDED SHORTNESS OF BREATH)  . rosuvastatin (CRESTOR) 20 MG tablet Take 1 tablet (20 mg total) by mouth daily.  Marland Kitchen SPIRIVA HANDIHALER 18 MCG inhalation capsule INHALE THE CONTENTS OF 1 CAPSULE VIA INHALATION DEVICE EVERY DAY  . traMADol (ULTRAM) 50 MG tablet Take 1 tablet (50 mg total) by mouth every 12 (twelve) hours as needed. for pain   No facility-administered encounter medications on file as of 01/10/2018.     Surgical History: Past Surgical History:  Procedure Laterality Date  . ABDOMINAL HYSTERECTOMY    . CHOLECYSTECTOMY    . ESOPHAGEAL MANOMETRY N/A 09/13/2017   Procedure: ESOPHAGEAL MANOMETRY (EM);  Surgeon: Mauri Pole, MD;  Location: [facility] ENDOSCOPY;  Service: Endoscopy;  Laterality: N/A;  . ESOPHAGOGASTRODUODENOSCOPY (EGD) WITH PROPOFOL N/A 05/11/2017   Procedure: ESOPHAGOGASTRODUODENOSCOPY (EGD) WITH PROPOFOL;  Surgeon: Lin Landsman, MD;  Location: [facility] ENDOSCOPY;  Service: Gastroenterology;  Laterality: N/A;  . FOOT SURGERY    . LAPAROSCOPIC NISSEN FUNDOPLICATION N/A 07/15/3152   Procedure: LAPAROSCOPIC NISSEN FUNDOPLICATION;  Surgeon: Jules Husbands, MD;  Location: [facility] ORS;  Service: General;  Laterality: N/A;  .  NISSEN FUNDOPLICATION N/A 06/11/4968   Procedure: NISSEN FUNDOPLICATION;  Surgeon: Jules Husbands, MD;  Location: [facility] ORS;  Service: General;  Laterality: N/A;    Medical History: Past Medical History:  Diagnosis Date  . Arthritis   . Complication of anesthesia     difficulty waking up  . COPD (chronic obstructive pulmonary disease) ([place])   . Dyspnea   . Emphysema of lung ([place])   . GERD (gastroesophageal reflux disease)   . Hyperlipidemia   . Hypertension   . Osteoporosis   . Oxygen deficiency    uses O2 at night    Family History: Family History  Problem Relation Age of Onset  . Ovarian cancer Mother   . Emphysema Father   . Melanoma Sister   . Lung cancer Brother     Social History: Social History   Socioeconomic History  . Marital status: Married    Spouse name: Not on file  . Number of children: Not on file  . Years of education: 33  . Highest education level: 12th grade  Occupational History  . Not on file  Social Needs  . Financial resource strain: Not hard at all  . Food insecurity:    Worry: Never true    Inability: Never true  . Transportation needs:    Medical: No    Non-medical: No  Tobacco Use  . Smoking status: Former Smoker    Types: Cigarettes    Last attempt to quit: 03/18/2001    Years since quitting: 16.8  . Smokeless tobacco: Never Used  Substance and Sexual Activity  . Alcohol use: Yes    Comment: rare  . Drug use: No  . Sexual activity: Not Currently  Lifestyle  . Physical activity:    Days per week: 0 days    Minutes per session: 0 min  . Stress: Not at all  Relationships  . Social connections:    Talks on phone: More than three times a week    Gets together: More than three times a week    Attends religious service: More than 4 times per year    Active member of club or organization: No    Attends meetings of clubs or organizations: Never    Relationship status: Married  . Intimate partner violence:    Fear of current or ex partner: No    Emotionally abused: No    Physically abused: No    Forced sexual activity: No  Other Topics Concern  . Not on file  Social History Narrative  . Not on file    Vital Signs: Blood pressure 136/75, pulse 90, resp. rate 16, height _0  (1.6 m),  weight 135 lb 6.4 oz (61.4 kg), SpO2 93 %.  Examination: General Appearance: The patient is well-developed, well-nourished, and in no distress. Skin: Gross inspection of skin unremarkable. Head: normocephalic, no gross deformities. Eyes: no gross deformities noted. ENT: ears appear grossly normal no exudates. Neck: Supple. No thyromegaly. No LAD. Respiratory: no rhonchi noted. Cardiovascular: Normal S1 and S2 without murmur or rub. Extremities: No cyanosis. pulses are equal. Neurologic: Alert and oriented. No involuntary movements.  LABS: Recent Results (from the past 2160 hour(s))  I-STAT 4, (NA,K, GLUC, HGB,HCT)     Status: Abnormal   Collection Time: 10/19/17  8:38 AM  Result Value Ref Range   Sodium 135 135 - 145 mmol/L   Potassium 3.1 (L) 3.5 - 5.1 mmol/L   Glucose, Bld 102 (H) 70 - 99 mg/dL  HCT 41.0 36.0 - 46.0 %   Hemoglobin 13.9 12.0 - 15.0 g/dL  HIV antibody (Routine Testing)     Status: None   Collection Time: 10/20/17  7:07 AM  Result Value Ref Range   HIV Screen 4th Generation wRfx Non Reactive Non Reactive    Comment: (NOTE) Performed At: [facility] [place], [place] [id number] Rush Farmer MD IR:[id number]   Basic metabolic panel     Status: Abnormal   Collection Time: 10/20/17  7:07 AM  Result Value Ref Range   Sodium 131 (L) 135 - 145 mmol/L   Potassium 3.5 3.5 - 5.1 mmol/L   Chloride 93 (L) 98 - 111 mmol/L    Comment: Please note change in reference range.   CO2 32 22 - 32 mmol/L   Glucose, Bld 94 70 - 99 mg/dL    Comment: Please note change in reference range.   BUN 20 8 - 23 mg/dL    Comment: Please note change in reference range.   Creatinine, Ser 0.76 0.44 - 1.00 mg/dL   Calcium 8.8 (L) 8.9 - 10.3 mg/dL   GFR calc non Af Amer >60 >60 mL/min   GFR calc Af Amer >60 >60 mL/min    Comment: (NOTE) The eGFR has been calculated using the CKD EPI equation. This calculation has not been validated in all clinical  situations. eGFR's persistently <60 mL/min signify possible Chronic Kidney Disease.    Anion gap 6 5 - 15    Comment: Performed at [facility], [address] [place], [place] [address]  CBC     Status: Abnormal   Collection Time: 10/20/17  7:07 AM  Result Value Ref Range   WBC 8.7 3.6 - 11.0 K/uL   RBC 3.57 (L) 3.80 - 5.20 MIL/uL   Hemoglobin 11.2 (L) 12.0 - 16.0 g/dL   HCT 32.6 (L) 35.0 - 47.0 %   MCV 91.1 80.0 - 100.0 fL   MCH 31.5 26.0 - 34.0 pg   MCHC 34.5 32.0 - 36.0 g/dL   RDW 13.4 11.5 - 14.5 %   Platelets 249 150 - 440 K/uL    Comment: Performed at [facility], [address], [place], [place] [address]    Radiology: No results found.  No results found.  No results found.    Assessment and Plan: Patient Active Problem List   Diagnosis Date Noted  . S/P Nissen fundoplication (with gastrostomy tube placement) ([place]) 10/19/2017  . CAD (coronary artery disease), native coronary artery 10/18/2017  . Hiatal hernia 10/15/2017  . Abnormal EKG 10/15/2017  . Dysphagia   . Schatzki's ring   . Gastric erosion   . Advanced care planning/counseling discussion 04/21/2017  . Acute on chronic respiratory failure with hypoxia ([place]) 05/08/2016  . COPD exacerbation ([place]) 05/08/2016  . Hyperglycemia 05/08/2016  . Dyspnea 05/06/2016  . Encounter for hepatitis C screening test for low risk patient 03/19/2016  . Essential hypertension 03/19/2015  . COPD (chronic obstructive pulmonary disease) ([place]) 03/19/2015  . Hyperlipidemia 03/19/2015  . Osteoarthritis of both hands 03/19/2015  . Osteoporosis 03/19/2015  . Acid reflux 03/19/2015  . Major depression, chronic 09/12/2014    1. COPD severe disease at this time is controlled we will continue with present management.  She also will receive her flu shot.  We will continue with supportive care 2. CAD stable at this time no active chest pain. 3. GERD she is doing much better since her surgery symptoms have  improved  and she notes that her breathing has actually gotten better also 4. Depression stable continue with present management 5. SOB clinically improved we will continue with present therapy 6. Flu shot given today  General Counseling: I have discussed the findings of the evaluation and examination with Dorian Pod.  I have also discussed any further diagnostic evaluation thatmay be needed or ordered today. Chrisoula verbalizes understanding of the findings of todays visit. We also reviewed her medications today and discussed drug interactions and side effects including but not limited excessive drowsiness and altered mental states. We also discussed that there is always a risk not just to her but also people around her. she has been encouraged to call the office with any questions or concerns that should arise related to todays visit.    Time spent: 52mn  I have personally obtained a history, examined the patient, evaluated laboratory and imaging results, formulated the assessment and plan and placed orders.    SAllyne Gee MD [facility] Sleep [facility]

## 2018-01-13 ENCOUNTER — Other Ambulatory Visit: Payer: Self-pay | Admitting: Family Medicine

## 2018-01-13 ENCOUNTER — Other Ambulatory Visit: Payer: Self-pay | Admitting: Gastroenterology

## 2018-01-13 NOTE — Telephone Encounter (Signed)
Not on this medication Requested Prescriptions  Refused Prescriptions Disp Refills  . pantoprazole (PROTONIX) 40 MG tablet [Pharmacy Med Name: PANTOPRAZOLE 40MG  TABLETS] 90 tablet 0    Sig: TAKE 1 TABLET BY MOUTH EVERY DAY.     Gastroenterology: Proton Pump Inhibitors Passed - 01/13/2018  3:14 AM      Passed - Valid encounter within last 12 months    Recent Outpatient Visits          8 months ago Essential hypertension   [facility], Jeannette How, MD   1 year ago Essential hypertension   [facility] Crissman, Jeannette How, MD   1 year ago Essential hypertension   [place], Jeannette How, MD   1 year ago Essential hypertension   [facility] Crissman, Jeannette How, MD   1 year ago Need for influenza vaccination   [place], MD      Future Appointments            In 2 weeks Crissman, Jeannette How, MD [facility], [place]   In 3 months  [facility], [place]

## 2018-01-14 ENCOUNTER — Other Ambulatory Visit: Payer: Self-pay | Admitting: Internal Medicine

## 2018-01-18 ENCOUNTER — Encounter: Payer: Self-pay | Admitting: Internal Medicine

## 2018-01-18 NOTE — Progress Notes (Signed)
SCANNED IN 6 MIN WALK DONE ON 01/10/17.

## 2018-01-30 DIAGNOSIS — J441 Chronic obstructive pulmonary disease with (acute) exacerbation: Secondary | ICD-10-CM | POA: Diagnosis not present

## 2018-02-01 DIAGNOSIS — J441 Chronic obstructive pulmonary disease with (acute) exacerbation: Secondary | ICD-10-CM | POA: Diagnosis not present

## 2018-02-02 ENCOUNTER — Encounter: Payer: Self-pay | Admitting: Family Medicine

## 2018-02-02 ENCOUNTER — Ambulatory Visit (INDEPENDENT_AMBULATORY_CARE_PROVIDER_SITE_OTHER): Payer: PPO | Admitting: Family Medicine

## 2018-02-02 ENCOUNTER — Encounter

## 2018-02-02 VITALS — BP 122/74 | HR 95 | Temp 98.7°F | Ht 63.0 in | Wt 136.2 lb

## 2018-02-02 DIAGNOSIS — E7800 Pure hypercholesterolemia, unspecified: Secondary | ICD-10-CM

## 2018-02-02 DIAGNOSIS — I10 Essential (primary) hypertension: Secondary | ICD-10-CM

## 2018-02-02 DIAGNOSIS — M25512 Pain in left shoulder: Secondary | ICD-10-CM | POA: Insufficient documentation

## 2018-02-02 DIAGNOSIS — G8929 Other chronic pain: Secondary | ICD-10-CM | POA: Diagnosis not present

## 2018-02-02 LAB — LP+ALT+AST PICCOLO, WAIVED
ALT (SGPT) Piccolo, Waived: 19 U/L (ref 10–47)
AST (SGOT) PICCOLO, WAIVED: 29 U/L (ref 11–38)
CHOL/HDL RATIO PICCOLO,WAIVE: 2.6 mg/dL
Cholesterol Piccolo, Waived: 168 mg/dL (ref <200)
HDL CHOL PICCOLO, WAIVED: 66 mg/dL (ref >59)
LDL Chol Calc Piccolo Waived: 65 mg/dL (ref <100)
TRIGLYCERIDES PICCOLO,WAIVED: 186 mg/dL — AB (ref <150)
VLDL Chol Calc Piccolo,Waive: 37 mg/dL — ABNORMAL HIGH (ref <30)

## 2018-02-02 MED ORDER — NAPROXEN 500 MG PO TABS: 500.0000 mg | ORAL_TABLET | Freq: Two times a day (BID) | ORAL | 2 refills | Status: DC

## 2018-02-02 NOTE — Assessment & Plan Note (Signed)
The current medical regimen is effective;  continue present plan and medications.  

## 2018-02-02 NOTE — Assessment & Plan Note (Addendum)
Ongoing chronic pain after surgery of left shoulder with symptoms consistent with rotator cuff will refer to orthopedics to further review and evaluate. Trial of Naprosyn is meloxicam did not help

## 2018-02-02 NOTE — Progress Notes (Signed)
BP 122/74 (BP Location: Left Arm, Patient Position: Sitting, Cuff Size: Normal)   Pulse 95   Temp 98.7 F (37.1 C) (Oral)   Ht 5\' 3"  (1.6 m)   Wt 136 lb 3.2 oz (61.8 kg)   SpO2 96%   BMI 24.13 kg/m    Subjective:    Patient ID: Joyce Rodriguez, female    DOB: Dec 27, 1945, 72 y.o.   MRN: [id number]  HPI: Joyce Rodriguez is a 72 y.o. female  Chief Complaint  Patient presents with  . Hypertension  . Hyperlipidemia  . Low Potassium    (before patient's surgery in July)  . Shoulder Pain    Left   Patient with multiple concerns. Biggest concern today is left shoulder pain which is been ongoing since woke up from anesthesia for hiatal hernia surgery.  Patient's had an x-ray of her shoulder has tried meloxicam all with no real relief prevents her from sleeping well and is limiting her range of motion. Blood pressure cholesterol all doing well is taking potassium supplements prior to surgery because of low potassium has not had that checked again after surgery.  Relevant past medical, surgical, family and social history reviewed and updated as indicated. Interim medical history since our last visit reviewed. Allergies and medications reviewed and updated.  Review of Systems  Constitutional: Negative.   Respiratory: Negative.   Cardiovascular: Negative.     Per HPI unless specifically indicated above     Objective:    BP 122/74 (BP Location: Left Arm, Patient Position: Sitting, Cuff Size: Normal)   Pulse 95   Temp 98.7 F (37.1 C) (Oral)   Ht 5\' 3"  (1.6 m)   Wt 136 lb 3.2 oz (61.8 kg)   SpO2 96%   BMI 24.13 kg/m   Wt Readings from Last 3 Encounters:  02/02/18 136 lb 3.2 oz (61.8 kg)  01/10/18 135 lb 6.4 oz (61.4 kg)  11/10/17 139 lb (63 kg)    Physical Exam  Constitutional: She is oriented to person, place, and time. She appears well-developed and well-nourished.  HENT:  Head: Normocephalic and atraumatic.  Eyes: Conjunctivae and EOM are normal.  Neck: Normal range  of motion.  Cardiovascular: Normal rate, regular rhythm and normal heart sounds.  Pulmonary/Chest: Effort normal and breath sounds normal.  Musculoskeletal: Normal range of motion.  Neurological: She is alert and oriented to person, place, and time.  Skin: No erythema.  Psychiatric: She has a normal mood and affect. Her behavior is normal. Judgment and thought content normal.    Results for orders placed or performed during the hospital encounter of 10/19/17  HIV antibody (Routine Testing)  Result Value Ref Range   HIV Screen 4th Generation wRfx Non Reactive Non Reactive  Basic metabolic panel  Result Value Ref Range   Sodium 131 (L) 135 - 145 mmol/L   Potassium 3.5 3.5 - 5.1 mmol/L   Chloride 93 (L) 98 - 111 mmol/L   CO2 32 22 - 32 mmol/L   Glucose, Bld 94 70 - 99 mg/dL   BUN 20 8 - 23 mg/dL   Creatinine, Ser 0.76 0.44 - 1.00 mg/dL   Calcium 8.8 (L) 8.9 - 10.3 mg/dL   GFR calc non Af Amer >60 >60 mL/min   GFR calc Af Amer >60 >60 mL/min   Anion gap 6 5 - 15  CBC  Result Value Ref Range   WBC 8.7 3.6 - 11.0 K/uL   RBC 3.57 (L) 3.80 - 5.20 MIL/uL  Hemoglobin 11.2 (L) 12.0 - 16.0 g/dL   HCT 32.6 (L) 35.0 - 47.0 %   MCV 91.1 80.0 - 100.0 fL   MCH 31.5 26.0 - 34.0 pg   MCHC 34.5 32.0 - 36.0 g/dL   RDW 13.4 11.5 - 14.5 %   Platelets 249 150 - 440 K/uL  I-STAT 4, (NA,K, GLUC, HGB,HCT)  Result Value Ref Range   Sodium 135 135 - 145 mmol/L   Potassium 3.1 (L) 3.5 - 5.1 mmol/L   Glucose, Bld 102 (H) 70 - 99 mg/dL   HCT 41.0 36.0 - 46.0 %   Hemoglobin 13.9 12.0 - 15.0 g/dL      Assessment & Plan:   Problem List Items Addressed This Visit      Cardiovascular and Mediastinum   Essential hypertension - Primary    The current medical regimen is effective;  continue present plan and medications.       Relevant Orders   Basic metabolic panel     Other   Hyperlipidemia    The current medical regimen is effective;  continue present plan and medications.       Relevant  Orders   LP+ALT+AST [place], Waived   Shoulder pain, left    Ongoing chronic pain after surgery of left shoulder with symptoms consistent with rotator cuff will refer to orthopedics to further review and evaluate. Trial of Naprosyn is meloxicam did not help      Relevant Orders   Ambulatory referral to Orthopedic Surgery       Follow up plan: Return in about 2 months (around 04/04/2018) for Physical Exam.

## 2018-02-03 ENCOUNTER — Other Ambulatory Visit: Payer: PPO

## 2018-02-03 ENCOUNTER — Other Ambulatory Visit: Payer: Self-pay | Admitting: Family Medicine

## 2018-02-03 DIAGNOSIS — E876 Hypokalemia: Secondary | ICD-10-CM

## 2018-02-03 DIAGNOSIS — E7800 Pure hypercholesterolemia, unspecified: Secondary | ICD-10-CM

## 2018-02-03 LAB — BASIC METABOLIC PANEL
BUN / CREAT RATIO: 23 (ref 12–28)
BUN: 38 mg/dL — ABNORMAL HIGH (ref 8–27)
CO2: 23 mmol/L (ref 20–29)
Calcium: 10 mg/dL (ref 8.7–10.3)
Chloride: 99 mmol/L (ref 96–106)
Creatinine, Ser: 1.63 mg/dL — ABNORMAL HIGH (ref 0.57–1.00)
GFR calc Af Amer: 36 mL/min/{1.73_m2} — ABNORMAL LOW (ref >59)
GFR, EST NON AFRICAN AMERICAN: 31 mL/min/{1.73_m2} — AB (ref >59)
GLUCOSE: 117 mg/dL — AB (ref 65–99)
Potassium: 2.8 mmol/L — ABNORMAL LOW (ref 3.5–5.2)
SODIUM: 139 mmol/L (ref 134–144)

## 2018-02-03 NOTE — Progress Notes (Signed)
Phone call Discussed with patient marked decline in renal function patient's been taking meloxicam daily and switch from meloxicam to Naprosyn yesterday during office visit.  Patient states that with the Naprosyn she was able to sleep better and her shoulder felt better.  Patient also taking potassium over-the-counter 1 a day and has had low potassium with hydrochlorothiazide. Discussed with patient over the phone to stop Naprosyn aspirin any other nonsteroidal anti-inflammatory agent.  Can use Tylenol.  Increase potassium supplement to 3 a day. Discontinue hydrochlorothiazide.  Recheck BMP today and will proceed by phone about follow-up visits.

## 2018-02-04 LAB — BASIC METABOLIC PANEL
BUN/Creatinine Ratio: 23 (ref 12–28)
BUN: 39 mg/dL — AB (ref 8–27)
CALCIUM: 10.7 mg/dL — AB (ref 8.7–10.3)
CHLORIDE: 97 mmol/L (ref 96–106)
CO2: 22 mmol/L (ref 20–29)
CREATININE: 1.7 mg/dL — AB (ref 0.57–1.00)
GFR calc Af Amer: 34 mL/min/{1.73_m2} — ABNORMAL LOW (ref >59)
GFR calc non Af Amer: 30 mL/min/{1.73_m2} — ABNORMAL LOW (ref >59)
GLUCOSE: 87 mg/dL (ref 65–99)
Potassium: 3.3 mmol/L — ABNORMAL LOW (ref 3.5–5.2)
SODIUM: 141 mmol/L (ref 134–144)

## 2018-02-08 ENCOUNTER — Telehealth: Payer: Self-pay | Admitting: Family Medicine

## 2018-02-08 DIAGNOSIS — E876 Hypokalemia: Secondary | ICD-10-CM

## 2018-02-08 DIAGNOSIS — M542 Cervicalgia: Secondary | ICD-10-CM | POA: Diagnosis not present

## 2018-02-08 DIAGNOSIS — M25512 Pain in left shoulder: Secondary | ICD-10-CM | POA: Diagnosis not present

## 2018-02-08 NOTE — Telephone Encounter (Signed)
Copied from [place]. Topic: Quick Communication - See Telephone Encounter >> Feb 08, 2018  3:48 PM Sheran Luz wrote: CRM for notification. See Telephone encounter for: 02/08/18.  Pt called to check status of lab results from 10/24. Please advise. Not specified if [facility] can release information.

## 2018-02-09 NOTE — Telephone Encounter (Signed)
Phone call Discussed with patient BMP improved with potassium improved patient has stopped hydrochlorothiazide will continue taking potassium 1 a day. Patient's been to orthopedics for her shoulder got a shot and some tramadol referred to physical therapy is not taking any more nonsteroidals. Will recheck kidney function in 2 weeks and reassess at that time.

## 2018-02-09 NOTE — Telephone Encounter (Signed)
Call pt  Yes please

## 2018-02-09 NOTE — Telephone Encounter (Signed)
Dr. Jeananne Rama, it looks like you wanted to speak to this patient about her labs. Is that correct?

## 2018-02-17 ENCOUNTER — Other Ambulatory Visit: Payer: PPO

## 2018-02-17 DIAGNOSIS — M6281 Muscle weakness (generalized): Secondary | ICD-10-CM | POA: Diagnosis not present

## 2018-02-17 DIAGNOSIS — E876 Hypokalemia: Secondary | ICD-10-CM | POA: Diagnosis not present

## 2018-02-17 DIAGNOSIS — M542 Cervicalgia: Secondary | ICD-10-CM | POA: Diagnosis not present

## 2018-02-17 DIAGNOSIS — M25612 Stiffness of left shoulder, not elsewhere classified: Secondary | ICD-10-CM | POA: Diagnosis not present

## 2018-02-18 LAB — BASIC METABOLIC PANEL
BUN/Creatinine Ratio: 16 (ref 12–28)
BUN: 11 mg/dL (ref 8–27)
CALCIUM: 9.5 mg/dL (ref 8.7–10.3)
CHLORIDE: 97 mmol/L (ref 96–106)
CO2: 23 mmol/L (ref 20–29)
Creatinine, Ser: 0.69 mg/dL (ref 0.57–1.00)
GFR calc non Af Amer: 88 mL/min/{1.73_m2} (ref >59)
GFR, EST AFRICAN AMERICAN: 101 mL/min/{1.73_m2} (ref >59)
Glucose: 103 mg/dL — ABNORMAL HIGH (ref 65–99)
POTASSIUM: 3.3 mmol/L — AB (ref 3.5–5.2)
Sodium: 138 mmol/L (ref 134–144)

## 2018-02-22 ENCOUNTER — Other Ambulatory Visit: Payer: Self-pay | Admitting: Family Medicine

## 2018-02-22 MED ORDER — POTASSIUM CHLORIDE ER 10 MEQ PO TBCR: 10.0000 meq | EXTENDED_RELEASE_TABLET | Freq: Two times a day (BID) | ORAL | 3 refills | Status: DC

## 2018-02-23 DIAGNOSIS — M6281 Muscle weakness (generalized): Secondary | ICD-10-CM | POA: Diagnosis not present

## 2018-02-23 DIAGNOSIS — M25612 Stiffness of left shoulder, not elsewhere classified: Secondary | ICD-10-CM | POA: Diagnosis not present

## 2018-02-23 DIAGNOSIS — M542 Cervicalgia: Secondary | ICD-10-CM | POA: Diagnosis not present

## 2018-03-01 DIAGNOSIS — M6281 Muscle weakness (generalized): Secondary | ICD-10-CM | POA: Diagnosis not present

## 2018-03-01 DIAGNOSIS — M25612 Stiffness of left shoulder, not elsewhere classified: Secondary | ICD-10-CM | POA: Diagnosis not present

## 2018-03-01 DIAGNOSIS — M542 Cervicalgia: Secondary | ICD-10-CM | POA: Diagnosis not present

## 2018-03-03 DIAGNOSIS — M542 Cervicalgia: Secondary | ICD-10-CM | POA: Diagnosis not present

## 2018-03-03 DIAGNOSIS — M25612 Stiffness of left shoulder, not elsewhere classified: Secondary | ICD-10-CM | POA: Diagnosis not present

## 2018-03-03 DIAGNOSIS — M6281 Muscle weakness (generalized): Secondary | ICD-10-CM | POA: Diagnosis not present

## 2018-03-09 DIAGNOSIS — M6281 Muscle weakness (generalized): Secondary | ICD-10-CM | POA: Diagnosis not present

## 2018-03-09 DIAGNOSIS — M542 Cervicalgia: Secondary | ICD-10-CM | POA: Diagnosis not present

## 2018-03-09 DIAGNOSIS — M25612 Stiffness of left shoulder, not elsewhere classified: Secondary | ICD-10-CM | POA: Diagnosis not present

## 2018-03-14 DIAGNOSIS — M542 Cervicalgia: Secondary | ICD-10-CM | POA: Diagnosis not present

## 2018-03-14 DIAGNOSIS — M25612 Stiffness of left shoulder, not elsewhere classified: Secondary | ICD-10-CM | POA: Diagnosis not present

## 2018-03-14 DIAGNOSIS — M6281 Muscle weakness (generalized): Secondary | ICD-10-CM | POA: Diagnosis not present

## 2018-03-17 DIAGNOSIS — M542 Cervicalgia: Secondary | ICD-10-CM | POA: Diagnosis not present

## 2018-03-23 ENCOUNTER — Ambulatory Visit (INDEPENDENT_AMBULATORY_CARE_PROVIDER_SITE_OTHER): Payer: PPO | Admitting: Adult Health

## 2018-03-23 ENCOUNTER — Encounter: Payer: Self-pay | Admitting: Adult Health

## 2018-03-23 VITALS — BP 128/72 | HR 104 | Resp 16 | Ht 63.0 in | Wt 132.0 lb

## 2018-03-23 DIAGNOSIS — J449 Chronic obstructive pulmonary disease, unspecified: Secondary | ICD-10-CM | POA: Diagnosis not present

## 2018-03-23 DIAGNOSIS — R0602 Shortness of breath: Secondary | ICD-10-CM | POA: Diagnosis not present

## 2018-03-23 DIAGNOSIS — Z9981 Dependence on supplemental oxygen: Secondary | ICD-10-CM | POA: Diagnosis not present

## 2018-03-23 DIAGNOSIS — K219 Gastro-esophageal reflux disease without esophagitis: Secondary | ICD-10-CM

## 2018-03-23 DIAGNOSIS — J9611 Chronic respiratory failure with hypoxia: Secondary | ICD-10-CM

## 2018-03-23 NOTE — Progress Notes (Signed)
[facility] [place], [place] [address]  Pulmonary Sleep Medicine   Office Visit Note  Patient Name: Joyce Rodriguez DOB: 1946-03-20 MRN [id number]  Date of Service: 03/23/2018  Complaints/HPI: Pt is here for follow up  On COPD, and respiratory failure with hypoxia.  Patient is currently using home O2 at 2 to 3 L/min via nasal cannula continuously day and night.  She has a Marine scientist.  A 6-minute walk is performed in the office today and patient's O2 saturation dropped below 88%.  See flow sheet for details.  Patient demonstrates continued need for oxygen and she states that when using oxygen she feels much less shortness of breath.  ROS  General: (-) fever, (-) chills, (-) night sweats, (-) weakness Skin: (-) rashes, (-) itching,. Eyes: (-) visual changes, (-) redness, (-) itching. Nose and Sinuses: (-) nasal stuffiness or itchiness, (-) postnasal drip, (-) nosebleeds, (-) sinus trouble. Mouth and Throat: (-) sore throat, (-) hoarseness. Neck: (-) swollen glands, (-) enlarged thyroid, (-) neck pain. Respiratory: + cough, (-) bloody sputum, + shortness of breath, + wheezing. Cardiovascular: - ankle swelling, (-) chest pain. Lymphatic: (-) lymph node enlargement. Neurologic: (-) numbness, (-) tingling. Psychiatric: (-) anxiety, (-) depression   Current Medication: Outpatient Encounter Medications as of 03/23/2018  Medication Sig  . acetaminophen (TYLENOL) 500 MG tablet Take 500 mg by mouth every 6 (six) hours as needed.  . cholecalciferol (VITAMIN D) 1000 units tablet Take 1,000 Units by mouth daily.  Marland Kitchen escitalopram (LEXAPRO) 10 MG tablet Take 1 tablet (10 mg total) by mouth daily.  . Fluticasone-Salmeterol (ADVAIR DISKUS) 250-50 MCG/DOSE AEPB Inhale 1 puff into the lungs 2 (two) times daily.  . metoCLOPramide (REGLAN) 5 MG tablet Take 1 tablet (5 mg total) by mouth every 8 (eight) hours as needed for nausea.  . Multiple Vitamin (MULTIVITAMIN  WITH MINERALS) TABS tablet Take 1 tablet by mouth daily.  . OXYGEN Inhale 2 L into the lungs.   . potassium chloride (K-DUR) 10 MEQ tablet Take 1 tablet (10 mEq total) by mouth 2 (two) times daily.  . potassium chloride (K-DUR,KLOR-CON) 10 MEQ tablet Take 10 mEq by mouth 2 (two) times daily.  Marland Kitchen PROAIR HFA 108 (90 Base) MCG/ACT inhaler INHALE 2 PUFFS BY MOUTH EVERY 6 HOURS AS NEEDED (Patient taking differently: INHALE 2 PUFFS BY MOUTH EVERY 6 HOURS AS NEEDED SHORTNESS OF BREATH)  . rosuvastatin (CRESTOR) 20 MG tablet Take 1 tablet (20 mg total) by mouth daily.  Marland Kitchen tiotropium (SPIRIVA HANDIHALER) 18 MCG inhalation capsule Place 1 capsule (18 mcg total) into inhaler and inhale daily.  . traMADol (ULTRAM) 50 MG tablet TK 1 T PO Q 6 H PRN P   No facility-administered encounter medications on file as of 03/23/2018.     Surgical History: Past Surgical History:  Procedure Laterality Date  . ABDOMINAL HYSTERECTOMY    . CHOLECYSTECTOMY    . ESOPHAGEAL MANOMETRY N/A 09/13/2017   Procedure: ESOPHAGEAL MANOMETRY (EM);  Surgeon: Mauri Pole, MD;  Location: [facility] ENDOSCOPY;  Service: Endoscopy;  Laterality: N/A;  . ESOPHAGOGASTRODUODENOSCOPY (EGD) WITH PROPOFOL N/A 05/11/2017   Procedure: ESOPHAGOGASTRODUODENOSCOPY (EGD) WITH PROPOFOL;  Surgeon: Lin Landsman, MD;  Location: [facility] ENDOSCOPY;  Service: Gastroenterology;  Laterality: N/A;  . FOOT SURGERY    . LAPAROSCOPIC NISSEN FUNDOPLICATION N/A 07/20/7024   Procedure: LAPAROSCOPIC NISSEN FUNDOPLICATION;  Surgeon: Jules Husbands, MD;  Location: [facility] ORS;  Service: General;  Laterality: N/A;  . NISSEN FUNDOPLICATION N/A 06/18/8586  Procedure: NISSEN FUNDOPLICATION;  Surgeon: Jules Husbands, MD;  Location: [facility] ORS;  Service: General;  Laterality: N/A;    Medical History: Past Medical History:  Diagnosis Date  . Arthritis   . Complication of anesthesia    difficulty waking up  . COPD (chronic obstructive pulmonary disease) ([place])   . Dyspnea   .  Emphysema of lung ([place])   . GERD (gastroesophageal reflux disease)   . Hyperlipidemia   . Hypertension   . Osteoporosis   . Oxygen deficiency    uses O2 at night    Family History: Family History  Problem Relation Age of Onset  . Ovarian cancer Mother   . Emphysema Father   . Melanoma Sister   . Lung cancer Brother     Social History: Social History   Socioeconomic History  . Marital status: Married    Spouse name: Not on file  . Number of children: Not on file  . Years of education: 15  . Highest education level: 12th grade  Occupational History  . Not on file  Social Needs  . Financial resource strain: Not hard at all  . Food insecurity:    Worry: Never true    Inability: Never true  . Transportation needs:    Medical: No    Non-medical: No  Tobacco Use  . Smoking status: Former Smoker    Types: Cigarettes    Last attempt to quit: 03/18/2001    Years since quitting: 17.0  . Smokeless tobacco: Never Used  Substance and Sexual Activity  . Alcohol use: Yes    Comment: rare  . Drug use: No  . Sexual activity: Not Currently  Lifestyle  . Physical activity:    Days per week: 0 days    Minutes per session: 0 min  . Stress: Not at all  Relationships  . Social connections:    Talks on phone: More than three times a week    Gets together: More than three times a week    Attends religious service: More than 4 times per year    Active member of club or organization: No    Attends meetings of clubs or organizations: Never    Relationship status: Married  . Intimate partner violence:    Fear of current or ex partner: No    Emotionally abused: No    Physically abused: No    Forced sexual activity: No  Other Topics Concern  . Not on file  Social History Narrative  . Not on file    Vital Signs: Blood pressure 128/72, pulse (!) 104, resp. rate 16, height 5\' 3"  (1.6 m), weight 132 lb (59.9 kg), SpO2 91 %.  Examination: General Appearance: The patient is  well-developed, well-nourished, and in no distress. Skin: Gross inspection of skin unremarkable. Head: normocephalic, no gross deformities. Eyes: no gross deformities noted. ENT: ears appear grossly normal no exudates. Neck: Supple. No thyromegaly. No LAD. Respiratory: clear to auscultation bilateraly. Cardiovascular: Normal S1 and S2 without murmur or rub. Extremities: No cyanosis. pulses are equal. Neurologic: Alert and oriented. No involuntary movements.  LABS: Recent Results (from the past 2160 hour(s))  LP+ALT+AST [place], [place]     Status: Abnormal   Collection Time: 02/02/18  1:39 PM  Result Value Ref Range   ALT (SGPT) Piccolo, Waived 19 10 - 47 U/L   AST (SGOT) Piccolo, Waived 29 11 - 38 U/L   Cholesterol Piccolo, Waived 168 <200 mg/dL    Comment:  Desirable                <200                         Borderline High      200- 239                         High                     >239    HDL Chol Piccolo, Waived 66 >59 mg/dL    Comment:                         Low HDL- Risk Factor     < 40                         High HDL- Negative       > 59                          Risk Factor (Desirable)    Triglycerides Piccolo,Waived 186 (H) <150 mg/dL    Comment:                         Normal                   <150                         Borderline High     150 - 199                         High                200 - 499                         Very High                >499    Chol/HDL Ratio Piccolo,Waive 2.6 mg/dL    Comment:                                        Female      Female                         Low Risk      < 5.0      < 4.5                         High Risk     > 4.9      > 4.4    LDL Chol Calc Piccolo Waived 65 <100 mg/dL    Comment:                         Optimal                  <100  Near Optimal        100 - 129                         Borderline High     130 - 159                         High                160  - 189                         Very High                >189    VLDL Chol Calc Piccolo,Waive 37 (H) <30 mg/dL    Comment:                         Normal                   < 30                         High                     > 29   Basic metabolic panel     Status: Abnormal   Collection Time: 02/02/18  1:41 PM  Result Value Ref Range   Glucose 117 (H) 65 - 99 mg/dL   BUN 38 (H) 8 - 27 mg/dL   Creatinine, Ser 1.63 (H) 0.57 - 1.00 mg/dL   GFR calc non Af Amer 31 (L) >59 mL/min/1.73   GFR calc Af Amer 36 (L) >59 mL/min/1.73   BUN/Creatinine Ratio 23 12 - 28   Sodium 139 134 - 144 mmol/L   Potassium 2.8 (L) 3.5 - 5.2 mmol/L   Chloride 99 96 - 106 mmol/L   CO2 23 20 - 29 mmol/L   Calcium 10.0 8.7 - 10.3 mg/dL  Basic metabolic panel     Status: Abnormal   Collection Time: 02/03/18  9:32 AM  Result Value Ref Range   Glucose 87 65 - 99 mg/dL   BUN 39 (H) 8 - 27 mg/dL   Creatinine, Ser 1.70 (H) 0.57 - 1.00 mg/dL   GFR calc non Af Amer 30 (L) >59 mL/min/1.73   GFR calc Af Amer 34 (L) >59 mL/min/1.73   BUN/Creatinine Ratio 23 12 - 28   Sodium 141 134 - 144 mmol/L   Potassium 3.3 (L) 3.5 - 5.2 mmol/L   Chloride 97 96 - 106 mmol/L   CO2 22 20 - 29 mmol/L   Calcium 10.7 (H) 8.7 - 10.3 mg/dL  Basic metabolic panel     Status: Abnormal   Collection Time: 02/17/18 11:01 AM  Result Value Ref Range   Glucose 103 (H) 65 - 99 mg/dL   BUN 11 8 - 27 mg/dL   Creatinine, Ser 0.69 0.57 - 1.00 mg/dL   GFR calc non Af Amer 88 >59 mL/min/1.73   GFR calc Af Amer 101 >59 mL/min/1.73   BUN/Creatinine Ratio 16 12 - 28   Sodium 138 134 - 144 mmol/L   Potassium 3.3 (L) 3.5 - 5.2 mmol/L   Chloride 97 96 - 106 mmol/L   CO2 23 20 - 29 mmol/L   Calcium 9.5 8.7 - 10.3 mg/dL  Radiology: No results found.  No results found.  No results found.    Assessment and Plan: Patient Active Problem List   Diagnosis Date Noted  . Shoulder pain, left 02/02/2018  . S/P Nissen fundoplication (with  gastrostomy tube placement) ([place]) 10/19/2017  . CAD (coronary artery disease), native coronary artery 10/18/2017  . Hiatal hernia 10/15/2017  . Abnormal EKG 10/15/2017  . Dysphagia   . Schatzki's ring   . Gastric erosion   . Advanced care planning/counseling discussion 04/21/2017  . Acute on chronic respiratory failure with hypoxia ([place]) 05/08/2016  . COPD exacerbation ([place]) 05/08/2016  . Hyperglycemia 05/08/2016  . Dyspnea 05/06/2016  . Encounter for hepatitis C screening test for low risk patient 03/19/2016  . Essential hypertension 03/19/2015  . COPD (chronic obstructive pulmonary disease) ([place]) 03/19/2015  . Hyperlipidemia 03/19/2015  . Osteoarthritis of both hands 03/19/2015  . Osteoporosis 03/19/2015  . Acid reflux 03/19/2015  . Major depression, chronic 09/12/2014   1. COPD, severe ([place]) Stable, patient should continue to use inhalers and other medications as prescribed.  She will do well with continued oxygen use.  2. Chronic respiratory failure with hypoxia [facility]) Patient failed 6-minute walk in office today.  She demonstrates continued need for continuous oxygen therapy patient should continue to use her oxygen as prescribed.   3. SOB (shortness of breath) Patient desatted below 88% with walking. - 6 minute walk  4. Oxygen dependent Patient can use 2 to 3 L via nasal cannula continuously. - Pulse oximetry, overnight; Future  5. Gastroesophageal reflux disease without esophagitis We will, continue current medications.   General Counseling: I have discussed the findings of the evaluation and examination with Dorian Pod.  I have also discussed any further diagnostic evaluation thatmay be needed or ordered today. Blimi verbalizes understanding of the findings of todays visit. We also reviewed her medications today and discussed drug interactions and side effects including but not limited excessive drowsiness and altered mental states. We also discussed that there is always a  risk not just to her but also people around her. she has been encouraged to call the office with any questions or concerns that should arise related to todays visit.    Time spent: 25 This patient was seen by Orson Gear AGNP-C in Collaboration with Dr. Devona Konig as a part of collaborative care agreement.   I have personally obtained a history, examined the patient, evaluated laboratory and imaging results, formulated the assessment and plan and placed orders.    Allyne Gee, MD [facility] Pulmonary and Critical Care Sleep medicine

## 2018-03-23 NOTE — Patient Instructions (Signed)

## 2018-03-29 ENCOUNTER — Other Ambulatory Visit: Payer: PPO

## 2018-03-29 DIAGNOSIS — E876 Hypokalemia: Secondary | ICD-10-CM

## 2018-03-30 ENCOUNTER — Other Ambulatory Visit: Payer: Self-pay | Admitting: Adult Health

## 2018-03-30 ENCOUNTER — Telehealth: Payer: Self-pay

## 2018-03-30 ENCOUNTER — Encounter: Payer: Self-pay | Admitting: Family Medicine

## 2018-03-30 LAB — BASIC METABOLIC PANEL
BUN / CREAT RATIO: 16 (ref 12–28)
BUN: 12 mg/dL (ref 8–27)
CO2: 20 mmol/L (ref 20–29)
CREATININE: 0.73 mg/dL (ref 0.57–1.00)
Calcium: 10 mg/dL (ref 8.7–10.3)
Chloride: 97 mmol/L (ref 96–106)
GFR, EST AFRICAN AMERICAN: 96 mL/min/{1.73_m2} (ref >59)
GFR, EST NON AFRICAN AMERICAN: 83 mL/min/{1.73_m2} (ref >59)
Glucose: 111 mg/dL — ABNORMAL HIGH (ref 65–99)
Potassium: 4.6 mmol/L (ref 3.5–5.2)
Sodium: 135 mmol/L (ref 134–144)

## 2018-03-30 MED ORDER — LEVOFLOXACIN 500 MG PO TABS: 500.0000 mg | ORAL_TABLET | Freq: Every day | ORAL | 0 refills | Status: DC

## 2018-03-30 NOTE — Telephone Encounter (Signed)
Pt advised that we send levaquin if you  Not feeling good need to been seen

## 2018-03-30 NOTE — Progress Notes (Signed)
Levaquin sent to pharmacy for patient for continued cough. [facility] in [place].

## 2018-03-30 NOTE — Telephone Encounter (Signed)
Sent RX for Levaquin.  Take one tablet daily, WITH FOOD. If not better in 7-10 days, return to clinic.

## 2018-04-01 ENCOUNTER — Telehealth: Payer: Self-pay | Admitting: Family Medicine

## 2018-04-01 NOTE — Telephone Encounter (Signed)
Copied from [place] [id number]. Topic: Quick Communication - See Telephone Encounter >> Apr 01, 2018  2:37 PM Margot Ables wrote: CRM for notification. See Telephone encounter for: 04/01/18.  Pt calling for lab results. She states she thought she would hear by Friday. Pt aware that Dr. Jeananne Rama will be out of the office and asking if someone else can give her results.

## 2018-04-01 NOTE — Telephone Encounter (Signed)
Letter was sent to patient 03/30/18 with normal results. Called and let patient know this information.

## 2018-04-04 DIAGNOSIS — M542 Cervicalgia: Secondary | ICD-10-CM | POA: Diagnosis not present

## 2018-04-19 ENCOUNTER — Other Ambulatory Visit: Payer: Self-pay

## 2018-04-19 DIAGNOSIS — J449 Chronic obstructive pulmonary disease, unspecified: Secondary | ICD-10-CM

## 2018-04-19 MED ORDER — FLUTICASONE-SALMETEROL 250-50 MCG/DOSE IN AEPB: 1.0000 | INHALATION_SPRAY | Freq: Two times a day (BID) | RESPIRATORY_TRACT | 3 refills | Status: DC

## 2018-04-20 ENCOUNTER — Ambulatory Visit: Payer: Self-pay

## 2018-04-21 ENCOUNTER — Ambulatory Visit
Admission: RE | Admit: 2018-04-21 | Discharge: 2018-04-21 | Disposition: A | Discharge status: Home / Self Care | Payer: PPO | Attending: Internal Medicine | Admitting: Internal Medicine

## 2018-04-21 ENCOUNTER — Ambulatory Visit: Payer: Self-pay

## 2018-04-21 ENCOUNTER — Ambulatory Visit: Payer: PPO | Admitting: Internal Medicine

## 2018-04-21 ENCOUNTER — Encounter: Payer: Self-pay | Admitting: Internal Medicine

## 2018-04-21 ENCOUNTER — Ambulatory Visit
Admission: RE | Admit: 2018-04-21 | Discharge: 2018-04-21 | Disposition: A | Discharge status: Home / Self Care | Payer: PPO | Source: Ambulatory Visit | Attending: Internal Medicine | Admitting: Internal Medicine

## 2018-04-21 VITALS — BP 130/80 | HR 101 | Resp 16 | Ht 63.0 in | Wt 131.6 lb

## 2018-04-21 DIAGNOSIS — J441 Chronic obstructive pulmonary disease with (acute) exacerbation: Secondary | ICD-10-CM

## 2018-04-21 DIAGNOSIS — R0602 Shortness of breath: Secondary | ICD-10-CM | POA: Diagnosis not present

## 2018-04-21 DIAGNOSIS — Z9981 Dependence on supplemental oxygen: Secondary | ICD-10-CM | POA: Diagnosis not present

## 2018-04-21 DIAGNOSIS — R05 Cough: Secondary | ICD-10-CM | POA: Diagnosis not present

## 2018-04-21 DIAGNOSIS — J9611 Chronic respiratory failure with hypoxia: Secondary | ICD-10-CM | POA: Diagnosis not present

## 2018-04-21 MED ORDER — PREDNISONE 10 MG (21) PO TBPK: ORAL_TABLET | ORAL | 0 refills | Status: DC

## 2018-04-21 MED ORDER — AZITHROMYCIN 250 MG PO TABS
ORAL_TABLET | ORAL | 0 refills | Status: DC
Start: 2018-04-21 — End: 2018-11-16

## 2018-04-21 NOTE — Patient Instructions (Signed)

## 2018-04-21 NOTE — Progress Notes (Signed)
[facility] [place], [place] [address]  Pulmonary Sleep Medicine   Office Visit Note  Patient Name: Joyce Rodriguez DOB: [birth year] MRN [id number]  Date of Service: 04/21/2018  Complaints/HPI: cough not getting better also shoulder pain patient states that she still having a cough and congestion.  She is not feeling her usual self.  She is having more shortness of breath noted.  She has also been having issues with her shoulder pain which is being followed by her primary orthopedist.  Right now she had some wheezing noted.  She denies having any chest pain no fevers no chills are noted at this time.  ROS  General: (-) fever, (-) chills, (-) night sweats, (-) weakness Skin: (-) rashes, (-) itching,. Eyes: (-) visual changes, (-) redness, (-) itching. Nose and Sinuses: (-) nasal stuffiness or itchiness, (-) postnasal drip, (-) nosebleeds, (-) sinus trouble. Mouth and Throat: (-) sore throat, (-) hoarseness. Neck: (-) swollen glands, (-) enlarged thyroid, (-) neck pain. Respiratory: + cough, (-) bloody sputum, + shortness of breath, - wheezing. Cardiovascular: - ankle swelling, (-) chest pain. Lymphatic: (-) lymph node enlargement. Neurologic: (-) numbness, (-) tingling. Psychiatric: (-) anxiety, (-) depression   Current Medication: Outpatient Encounter Medications as of 04/21/2018  Medication Sig  . acetaminophen (TYLENOL) 500 MG tablet Take 500 mg by mouth every 6 (six) hours as needed.  . cholecalciferol (VITAMIN D) 1000 units tablet Take 1,000 Units by mouth daily.  Marland Kitchen escitalopram (LEXAPRO) 10 MG tablet Take 1 tablet (10 mg total) by mouth daily.  . Fluticasone-Salmeterol (ADVAIR DISKUS) 250-50 MCG/DOSE AEPB Inhale 1 puff into the lungs 2 (two) times daily.  . metoCLOPramide (REGLAN) 5 MG tablet Take 1 tablet (5 mg total) by mouth every 8 (eight) hours as needed for nausea.  . Multiple Vitamin (MULTIVITAMIN WITH MINERALS) TABS tablet Take 1 tablet by mouth  daily.  . OXYGEN Inhale 2 L into the lungs.   . potassium chloride (K-DUR) 10 MEQ tablet Take 1 tablet (10 mEq total) by mouth 2 (two) times daily.  . potassium chloride (K-DUR,KLOR-CON) 10 MEQ tablet Take 10 mEq by mouth 2 (two) times daily.  Marland Kitchen PROAIR HFA 108 (90 Base) MCG/ACT inhaler INHALE 2 PUFFS BY MOUTH EVERY 6 HOURS AS NEEDED (Patient taking differently: INHALE 2 PUFFS BY MOUTH EVERY 6 HOURS AS NEEDED SHORTNESS OF BREATH)  . rosuvastatin (CRESTOR) 20 MG tablet Take 1 tablet (20 mg total) by mouth daily.  Marland Kitchen tiotropium (SPIRIVA HANDIHALER) 18 MCG inhalation capsule Place 1 capsule (18 mcg total) into inhaler and inhale daily.  . traMADol (ULTRAM) 50 MG tablet TK 1 T PO Q 6 H PRN P  . levofloxacin (LEVAQUIN) 500 MG tablet Take 1 tablet (500 mg total) by mouth daily. (Patient not taking: Reported on 04/21/2018)   No facility-administered encounter medications on file as of 04/21/2018.     Surgical History: Past Surgical History:  Procedure Laterality Date  . ABDOMINAL HYSTERECTOMY    . CHOLECYSTECTOMY    . ESOPHAGEAL MANOMETRY N/A 09/13/2017   Procedure: ESOPHAGEAL MANOMETRY (EM);  Surgeon: Mauri Pole, MD;  Location: [facility] ENDOSCOPY;  Service: Endoscopy;  Laterality: N/A;  . ESOPHAGOGASTRODUODENOSCOPY (EGD) WITH PROPOFOL N/A 05/11/2017   Procedure: ESOPHAGOGASTRODUODENOSCOPY (EGD) WITH PROPOFOL;  Surgeon: Lin Landsman, MD;  Location: [facility] ENDOSCOPY;  Service: Gastroenterology;  Laterality: N/A;  . FOOT SURGERY    . LAPAROSCOPIC NISSEN FUNDOPLICATION N/A 6/0/1093   Procedure: LAPAROSCOPIC NISSEN FUNDOPLICATION;  Surgeon: Jules Husbands, MD;  Location: [facility] ORS;  Service: General;  Laterality: N/A;  . NISSEN FUNDOPLICATION N/A 08/18/3218   Procedure: NISSEN FUNDOPLICATION;  Surgeon: Jules Husbands, MD;  Location: [facility] ORS;  Service: General;  Laterality: N/A;    Medical History: Past Medical History:  Diagnosis Date  . Arthritis   . Complication of anesthesia    difficulty  waking up  . COPD (chronic obstructive pulmonary disease) ([place])   . Dyspnea   . Emphysema of lung ([place])   . GERD (gastroesophageal reflux disease)   . Hyperlipidemia   . Hypertension   . Osteoporosis   . Oxygen deficiency    uses O2 at night    Family History: Family History  Problem Relation Age of Onset  . Ovarian cancer Mother   . Emphysema Father   . Melanoma Sister   . Lung cancer Brother     Social History: Social History   Socioeconomic History  . Marital status: Married    Spouse name: Not on file  . Number of children: Not on file  . Years of education: 29  . Highest education level: 12th grade  Occupational History  . Not on file  Social Needs  . Financial resource strain: Not hard at all  . Food insecurity:    Worry: Never true    Inability: Never true  . Transportation needs:    Medical: No    Non-medical: No  Tobacco Use  . Smoking status: Former Smoker    Types: Cigarettes    Last attempt to quit: 03/18/2001    Years since quitting: 17.1  . Smokeless tobacco: Never Used  Substance and Sexual Activity  . Alcohol use: Yes    Comment: rare  . Drug use: No  . Sexual activity: Not Currently  Lifestyle  . Physical activity:    Days per week: 0 days    Minutes per session: 0 min  . Stress: Not at all  Relationships  . Social connections:    Talks on phone: More than three times a week    Gets together: More than three times a week    Attends religious service: More than 4 times per year    Active member of club or organization: No    Attends meetings of clubs or organizations: Never    Relationship status: Married  . Intimate partner violence:    Fear of current or ex partner: No    Emotionally abused: No    Physically abused: No    Forced sexual activity: No  Other Topics Concern  . Not on file  Social History Narrative  . Not on file    Vital Signs: Blood pressure 130/80, pulse (!) 101, resp. rate 16, height 5\' 3"  (1.6 m), weight 131  lb 9.6 oz (59.7 kg), SpO2 96 %.  Examination: General Appearance: The patient is well-developed, well-nourished, and in no distress. Skin: Gross inspection of skin unremarkable. Head: normocephalic, no gross deformities. Eyes: no gross deformities noted. ENT: ears appear grossly normal no exudates. Neck: Supple. No thyromegaly. No LAD. Respiratory: Diminished breath sounds with a few scattered rhonchi. Cardiovascular: Normal S1 and S2 without murmur or rub. Extremities: No cyanosis. pulses are equal. Neurologic: Alert and oriented. No involuntary movements.  LABS: Recent Results (from the past 2160 hour(s))  LP+ALT+AST [place], [place]     Status: Abnormal   Collection Time: 02/02/18  1:39 PM  Result Value Ref Range   ALT (SGPT) Piccolo, Waived 19 10 - 47 U/L   AST (  SGOT) Piccolo, Waived 29 11 - 38 U/L   Cholesterol Piccolo, Waived 168 <200 mg/dL    Comment:                         Desirable                <200                         Borderline High      200- 239                         High                     >239    HDL Chol Piccolo, Waived 66 >59 mg/dL    Comment:                         Low HDL- Risk Factor     < 40                         High HDL- Negative       > 59                          Risk Factor (Desirable)    Triglycerides Piccolo,Waived 186 (H) <150 mg/dL    Comment:                         Normal                   <150                         Borderline High     150 - 199                         High                200 - 499                         Very High                >499    Chol/HDL Ratio Piccolo,Waive 2.6 mg/dL    Comment:                                        Female      Female                         Low Risk      < 5.0      < 4.5                         High Risk     > 4.9      > 4.4    LDL Chol Calc Piccolo Waived 65 <100 mg/dL    Comment:  Optimal                  <100                         Near Optimal        100 -  129                         Borderline High     130 - 159                         High                160 - 189                         Very High                >189    VLDL Chol Calc Piccolo,Waive 37 (H) <30 mg/dL    Comment:                         Normal                   < 30                         High                     > 29   Basic metabolic panel     Status: Abnormal   Collection Time: 02/02/18  1:41 PM  Result Value Ref Range   Glucose 117 (H) 65 - 99 mg/dL   BUN 38 (H) 8 - 27 mg/dL   Creatinine, Ser 1.63 (H) 0.57 - 1.00 mg/dL   GFR calc non Af Amer 31 (L) >59 mL/min/1.73   GFR calc Af Amer 36 (L) >59 mL/min/1.73   BUN/Creatinine Ratio 23 12 - 28   Sodium 139 134 - 144 mmol/L   Potassium 2.8 (L) 3.5 - 5.2 mmol/L   Chloride 99 96 - 106 mmol/L   CO2 23 20 - 29 mmol/L   Calcium 10.0 8.7 - 10.3 mg/dL  Basic metabolic panel     Status: Abnormal   Collection Time: 02/03/18  9:32 AM  Result Value Ref Range   Glucose 87 65 - 99 mg/dL   BUN 39 (H) 8 - 27 mg/dL   Creatinine, Ser 1.70 (H) 0.57 - 1.00 mg/dL   GFR calc non Af Amer 30 (L) >59 mL/min/1.73   GFR calc Af Amer 34 (L) >59 mL/min/1.73   BUN/Creatinine Ratio 23 12 - 28   Sodium 141 134 - 144 mmol/L   Potassium 3.3 (L) 3.5 - 5.2 mmol/L   Chloride 97 96 - 106 mmol/L   CO2 22 20 - 29 mmol/L   Calcium 10.7 (H) 8.7 - 10.3 mg/dL  Basic metabolic panel     Status: Abnormal   Collection Time: 02/17/18 11:01 AM  Result Value Ref Range   Glucose 103 (H) 65 - 99 mg/dL   BUN 11 8 - 27 mg/dL   Creatinine, Ser 0.69 0.57 - 1.00 mg/dL   GFR calc non Af Amer 88 >59 mL/min/1.73   GFR calc Af Amer 101 >59 mL/min/1.73   BUN/Creatinine Ratio 16 12 - 28  Sodium 138 134 - 144 mmol/L   Potassium 3.3 (L) 3.5 - 5.2 mmol/L   Chloride 97 96 - 106 mmol/L   CO2 23 20 - 29 mmol/L   Calcium 9.5 8.7 - 10.3 mg/dL  Basic metabolic panel     Status: Abnormal   Collection Time: 03/29/18  8:54 AM  Result Value Ref Range   Glucose 111 (H) 65 -  99 mg/dL   BUN 12 8 - 27 mg/dL   Creatinine, Ser 0.73 0.57 - 1.00 mg/dL   GFR calc non Af Amer 83 >59 mL/min/1.73   GFR calc Af Amer 96 >59 mL/min/1.73   BUN/Creatinine Ratio 16 12 - 28   Sodium 135 134 - 144 mmol/L   Potassium 4.6 3.5 - 5.2 mmol/L   Chloride 97 96 - 106 mmol/L   CO2 20 20 - 29 mmol/L   Calcium 10.0 8.7 - 10.3 mg/dL    Radiology: No results found.  No results found.  No results found.    Assessment and Plan: Patient Active Problem List   Diagnosis Date Noted  . Shoulder pain, left 02/02/2018  . S/P Nissen fundoplication (with gastrostomy tube placement) ([place]) 10/19/2017  . CAD (coronary artery disease), native coronary artery 10/18/2017  . Hiatal hernia 10/15/2017  . Abnormal EKG 10/15/2017  . Dysphagia   . Schatzki's ring   . Gastric erosion   . Advanced care planning/counseling discussion 04/21/2017  . Acute on chronic respiratory failure with hypoxia ([place]) 05/08/2016  . COPD exacerbation ([place]) 05/08/2016  . Hyperglycemia 05/08/2016  . Dyspnea 05/06/2016  . Encounter for hepatitis C screening test for low risk patient 03/19/2016  . Essential hypertension 03/19/2015  . COPD (chronic obstructive pulmonary disease) ([place]) 03/19/2015  . Hyperlipidemia 03/19/2015  . Osteoarthritis of both hands 03/19/2015  . Osteoporosis 03/19/2015  . Acid reflux 03/19/2015  . Major depression, chronic 09/12/2014    1. Copd mild exacerbation will place her on a Z-Pak along with this will place her on prednisone.  She seems to do better with the combination of Z-Pak and prednisone. 2. SOB severe end-stage COPD she will continue with current regimen for her medications 3. Oxygen dependent on oxygen therapy she is compliant 4. Chronic respiratory failure with hypoxia continue with oxygen as ordered  General Counseling: I have discussed the findings of the evaluation and examination with Joyce Rodriguez.  I have also discussed any further diagnostic evaluation thatmay be needed or  ordered today. Joyce Rodriguez verbalizes understanding of the findings of todays visit. We also reviewed her medications today and discussed drug interactions and side effects including but not limited excessive drowsiness and altered mental states. We also discussed that there is always a risk not just to her but also people around her. she has been encouraged to call the office with any questions or concerns that should arise related to todays visit.  Orders Placed This Encounter  Procedures  . Spirometry with Graph    Order Specific Question:   Where should this test be performed?    Answer:   Other   Orders Placed This Encounter  Procedures  . DG Chest 2 View    Standing Status:   Future    Standing Expiration Date:   06/20/2019    Order Specific Question:   Reason for Exam (SYMPTOM  OR DIAGNOSIS REQUIRED)    Answer:   copd    Order Specific Question:   Preferred imaging location?    Answer:   [facility]  Order Specific Question:   Radiology Contrast Protocol - do NOT remove file path    Answer:   [email or web address]  . Spirometry with Graph    Order Specific Question:   Where should this test be performed?    Answer:   Other    Time spent: 55min  I have personally obtained a history, examined the patient, evaluated laboratory and imaging results, formulated the assessment and plan and placed orders.    Allyne Gee, MD [facility] Pulmonary and Critical Care Sleep medicine

## 2018-04-27 ENCOUNTER — Encounter: Payer: PPO | Admitting: Family Medicine

## 2018-05-02 DIAGNOSIS — J441 Chronic obstructive pulmonary disease with (acute) exacerbation: Secondary | ICD-10-CM | POA: Diagnosis not present

## 2018-05-04 DIAGNOSIS — M5412 Radiculopathy, cervical region: Secondary | ICD-10-CM | POA: Diagnosis not present

## 2018-05-04 DIAGNOSIS — J441 Chronic obstructive pulmonary disease with (acute) exacerbation: Secondary | ICD-10-CM | POA: Diagnosis not present

## 2018-05-05 ENCOUNTER — Ambulatory Visit: Payer: Self-pay

## 2018-05-14 DIAGNOSIS — M5412 Radiculopathy, cervical region: Secondary | ICD-10-CM | POA: Diagnosis not present

## 2018-05-23 ENCOUNTER — Ambulatory Visit (INDEPENDENT_AMBULATORY_CARE_PROVIDER_SITE_OTHER): Payer: PPO | Admitting: Internal Medicine

## 2018-05-23 ENCOUNTER — Encounter: Payer: Self-pay | Admitting: Internal Medicine

## 2018-05-23 VITALS — BP 120/80 | HR 76 | Resp 16 | Ht 63.0 in | Wt 128.0 lb

## 2018-05-23 DIAGNOSIS — K219 Gastro-esophageal reflux disease without esophagitis: Secondary | ICD-10-CM | POA: Diagnosis not present

## 2018-05-23 DIAGNOSIS — J449 Chronic obstructive pulmonary disease, unspecified: Secondary | ICD-10-CM | POA: Diagnosis not present

## 2018-05-23 DIAGNOSIS — J9611 Chronic respiratory failure with hypoxia: Secondary | ICD-10-CM | POA: Diagnosis not present

## 2018-05-23 DIAGNOSIS — Z9981 Dependence on supplemental oxygen: Secondary | ICD-10-CM | POA: Diagnosis not present

## 2018-05-23 NOTE — Progress Notes (Addendum)
[facility] [place],  [address]  Pulmonary Sleep Medicine   Office Visit Note  Patient Name: Joyce Rodriguez DOB: Nov 07, 1945 MRN [id number]  Date of Service: 05/23/2018  Complaints/HPI: Patient is here for 1 month follow-up to review results of chest x-ray.  Patient reports she is doing very well after antibiotics.  Her chest x-ray from January 9th, 2020 shows no acute cardiopulmonary disease.  And also a small nodule in the right midlung seen on the frontal view.  The radiologist reports that given the underlying changes of emphysema COPD a follow-up chest CT is recommended on a nonemergent basis.  Patient will return to the office in 4 months we will order CT at that time.  Patient reports she is continue to use her inhalers and other medications without any difficulty.  She denies any recent hospital admissions.  No chest pain, shortness of breath or sinus issues.  She does continue to use her oxygen day and night as needed.  ROS  General: (-) fever, (-) chills, (-) night sweats, (-) weakness Skin: (-) rashes, (-) itching,. Eyes: (-) visual changes, (-) redness, (-) itching. Nose and Sinuses: (-) nasal stuffiness or itchiness, (-) postnasal drip, (-) nosebleeds, (-) sinus trouble. Mouth and Throat: (-) sore throat, (-) hoarseness. Neck: (-) swollen glands, (-) enlarged thyroid, (-) neck pain. Respiratory: - cough, (-) bloody sputum, - shortness of breath, - wheezing. Cardiovascular: - ankle swelling, (-) chest pain. Lymphatic: (-) lymph node enlargement. Neurologic: (-) numbness, (-) tingling. Psychiatric: (-) anxiety, (-) depression   Current Medication: Outpatient Encounter Medications as of 05/23/2018  Medication Sig  . acetaminophen (TYLENOL) 500 MG tablet Take 500 mg by mouth every 6 (six) hours as needed.  Marland Kitchen azithromycin (ZITHROMAX) 250 MG tablet 1 zpk as directed  . cholecalciferol (VITAMIN D) 1000 units tablet Take 1,000 Units by mouth  daily.  Marland Kitchen escitalopram (LEXAPRO) 10 MG tablet Take 1 tablet (10 mg total) by mouth daily.  . Fluticasone-Salmeterol (ADVAIR DISKUS) 250-50 MCG/DOSE AEPB Inhale 1 puff into the lungs 2 (two) times daily.  . metoCLOPramide (REGLAN) 5 MG tablet Take 1 tablet (5 mg total) by mouth every 8 (eight) hours as needed for nausea.  . Multiple Vitamin (MULTIVITAMIN WITH MINERALS) TABS tablet Take 1 tablet by mouth daily.  . OXYGEN Inhale 2 L into the lungs.   . potassium chloride (K-DUR) 10 MEQ tablet Take 1 tablet (10 mEq total) by mouth 2 (two) times daily.  . potassium chloride (K-DUR,KLOR-CON) 10 MEQ tablet Take 10 mEq by mouth 2 (two) times daily.  . predniSONE (STERAPRED UNI-PAK 21 TAB) 10 MG (21) TBPK tablet 1 dose pack as directed  . PROAIR HFA 108 (90 Base) MCG/ACT inhaler INHALE 2 PUFFS BY MOUTH EVERY 6 HOURS AS NEEDED (Patient taking differently: INHALE 2 PUFFS BY MOUTH EVERY 6 HOURS AS NEEDED SHORTNESS OF BREATH)  . rosuvastatin (CRESTOR) 20 MG tablet Take 1 tablet (20 mg total) by mouth daily.  Marland Kitchen tiotropium (SPIRIVA HANDIHALER) 18 MCG inhalation capsule Place 1 capsule (18 mcg total) into inhaler and inhale daily.  . traMADol (ULTRAM) 50 MG tablet TK 1 T PO Q 6 H PRN P  . levofloxacin (LEVAQUIN) 500 MG tablet Take 1 tablet (500 mg total) by mouth daily. (Patient not taking: Reported on 05/23/2018)   No facility-administered encounter medications on file as of 05/23/2018.     Surgical History: Past Surgical History:  Procedure Laterality Date  . ABDOMINAL HYSTERECTOMY    . CHOLECYSTECTOMY    .  ESOPHAGEAL MANOMETRY N/A 09/13/2017   Procedure: ESOPHAGEAL MANOMETRY (EM);  Surgeon: Mauri Pole, MD;  Location: [facility] ENDOSCOPY;  Service: Endoscopy;  Laterality: N/A;  . ESOPHAGOGASTRODUODENOSCOPY (EGD) WITH PROPOFOL N/A 05/11/2017   Procedure: ESOPHAGOGASTRODUODENOSCOPY (EGD) WITH PROPOFOL;  Surgeon: Lin Landsman, MD;  Location: [facility] ENDOSCOPY;  Service: Gastroenterology;  Laterality: N/A;   . FOOT SURGERY    . LAPAROSCOPIC NISSEN FUNDOPLICATION N/A 05/18/9561   Procedure: LAPAROSCOPIC NISSEN FUNDOPLICATION;  Surgeon: Jules Husbands, MD;  Location: [facility] ORS;  Service: General;  Laterality: N/A;  . NISSEN FUNDOPLICATION N/A 11/17/5641   Procedure: NISSEN FUNDOPLICATION;  Surgeon: Jules Husbands, MD;  Location: [facility] ORS;  Service: General;  Laterality: N/A;    Medical History: Past Medical History:  Diagnosis Date  . Arthritis   . Complication of anesthesia    difficulty waking up  . COPD (chronic obstructive pulmonary disease) ([place])   . Dyspnea   . Emphysema of lung ([place])   . GERD (gastroesophageal reflux disease)   . Hyperlipidemia   . Hypertension   . Osteoporosis   . Oxygen deficiency    uses O2 at night    Family History: Family History  Problem Relation Age of Onset  . Ovarian cancer Mother   . Emphysema Father   . Melanoma Sister   . Lung cancer Brother     Social History: Social History   Socioeconomic History  . Marital status: Married    Spouse name: Not on file  . Number of children: Not on file  . Years of education: 18  . Highest education level: 12th grade  Occupational History  . Not on file  Social Needs  . Financial resource strain: Not hard at all  . Food insecurity:    Worry: Never true    Inability: Never true  . Transportation needs:    Medical: No    Non-medical: No  Tobacco Use  . Smoking status: Former Smoker    Types: Cigarettes    Last attempt to quit: 03/18/2001    Years since quitting: 17.1  . Smokeless tobacco: Never Used  Substance and Sexual Activity  . Alcohol use: Yes    Comment: rare  . Drug use: No  . Sexual activity: Not Currently  Lifestyle  . Physical activity:    Days per week: 0 days    Minutes per session: 0 min  . Stress: Not at all  Relationships  . Social connections:    Talks on phone: More than three times a week    Gets together: More than three times a week    Attends religious service:  More than 4 times per year    Active member of club or organization: No    Attends meetings of clubs or organizations: Never    Relationship status: Married  . Intimate partner violence:    Fear of current or ex partner: No    Emotionally abused: No    Physically abused: No    Forced sexual activity: No  Other Topics Concern  . Not on file  Social History Narrative  . Not on file    Vital Signs: Blood pressure 120/80, pulse 76, resp. rate 16, height 5\' 3"  (1.6 m), weight 128 lb (58.1 kg), SpO2 96 %.  Examination: General Appearance: The patient is well-developed, well-nourished, and in no distress. Skin: Gross inspection of skin unremarkable. Head: normocephalic, no gross deformities. Eyes: no gross deformities noted. ENT: ears appear grossly normal no exudates. Neck: Supple. No  thyromegaly. No LAD. Respiratory: clear bilaterally. Cardiovascular: Normal S1 and S2 without murmur or rub. Extremities: No cyanosis. pulses are equal. Neurologic: Alert and oriented. No involuntary movements.  LABS: Recent Results (from the past 2160 hour(s))  Basic metabolic panel     Status: Abnormal   Collection Time: 03/29/18  8:54 AM  Result Value Ref Range   Glucose 111 (H) 65 - 99 mg/dL   BUN 12 8 - 27 mg/dL   Creatinine, Ser 0.73 0.57 - 1.00 mg/dL   GFR calc non Af Amer 83 >59 mL/min/1.73   GFR calc Af Amer 96 >59 mL/min/1.73   BUN/Creatinine Ratio 16 12 - 28   Sodium 135 134 - 144 mmol/L   Potassium 4.6 3.5 - 5.2 mmol/L   Chloride 97 96 - 106 mmol/L   CO2 20 20 - 29 mmol/L   Calcium 10.0 8.7 - 10.3 mg/dL    Radiology: Dg Chest 2 View  Result Date: 04/21/2018 CLINICAL DATA:  Pt states she has had a productive cough, congestion and SOB for 3 weeks. History of COPD, former smoker, emphysema, HTN. EXAM: CHEST - 2 VIEW COMPARISON:  05/06/2016 FINDINGS: Cardiac silhouette is normal in size. No mediastinal or hilar masses. No convincing adenopathy. Small right mid lung nodule. There are  chronic changes of emphysema with prominent lower lung bronchovascular markings. No evidence of pneumonia or pulmonary edema. No pleural effusion or pneumothorax. Skeletal structures are demineralized but grossly intact. IMPRESSION: 1. No acute cardiopulmonary disease. 2. Small nodule in the right mid lung seen on the frontal view. Given the underlying changes of emphysema/COPD, follow-up chest CT is recommended on a nonemergent basis. Electronically Signed   By: Lajean Manes M.D.   On: 04/21/2018 14:52    No results found.  No results found.    Assessment and Plan: Patient Active Problem List   Diagnosis Date Noted  . Shoulder pain, left 02/02/2018  . S/P Nissen fundoplication (with gastrostomy tube placement) ([place]) 10/19/2017  . CAD (coronary artery disease), native coronary artery 10/18/2017  . Hiatal hernia 10/15/2017  . Abnormal EKG 10/15/2017  . Dysphagia   . Schatzki's ring   . Gastric erosion   . Advanced care planning/counseling discussion 04/21/2017  . Acute on chronic respiratory failure with hypoxia ([place]) 05/08/2016  . COPD exacerbation ([place]) 05/08/2016  . Hyperglycemia 05/08/2016  . Dyspnea 05/06/2016  . Encounter for hepatitis C screening test for low risk patient 03/19/2016  . Essential hypertension 03/19/2015  . COPD (chronic obstructive pulmonary disease) ([place]) 03/19/2015  . Hyperlipidemia 03/19/2015  . Osteoarthritis of both hands 03/19/2015  . Osteoporosis 03/19/2015  . Acid reflux 03/19/2015  . Major depression, chronic 09/12/2014    1. COPD, severe ([place]) Severe disease, controlled at this time. Continue supportive care.   2. Chronic respiratory failure with hypoxia ([facility]) We will continue to use 2 L/min of oxygen via nasal cannula.  3. Oxygen dependent Patient continues to need oxygen day and night continuously.  4. Gastroesophageal reflux disease without esophagitis Stable, denies overt symptoms.  Continue present therapy.   General Counseling: I  have discussed the findings of the evaluation and examination with Dorian Pod.  I have also discussed any further diagnostic evaluation thatmay be needed or ordered today. Diem verbalizes understanding of the findings of todays visit. We also reviewed her medications today and discussed drug interactions and side effects including but not limited excessive drowsiness and altered mental states. We also discussed that there is always a risk not just to her  but also people around her. she has been encouraged to call the office with any questions or concerns that should arise related to todays visit.    Time spent: 25  I have personally obtained a history, examined the patient, evaluated laboratory and imaging results, formulated the assessment and plan and placed orders.    Allyne Gee, MD [facility] Pulmonary and Critical Care Sleep medicine

## 2018-05-23 NOTE — Patient Instructions (Signed)
Chronic Obstructive Pulmonary Disease Chronic obstructive pulmonary disease (COPD) is a long-term (chronic) lung problem. When you have COPD, it is hard for air to get in and out of your lungs. Usually the condition gets worse over time, and your lungs will never return to normal. There are things you can do to keep yourself as healthy as possible.  Your doctor may treat your condition with: ? Medicines. ? Oxygen. ? Lung surgery.  Your doctor may also recommend: ? Rehabilitation. This includes steps to make your body work better. It may involve a team of specialists. ? Quitting smoking, if you smoke. ? Exercise and changes to your diet. ? Comfort measures (palliative care). Follow these instructions at home: Medicines  Take over-the-counter and prescription medicines only as told by your doctor.  Talk to your doctor before taking any cough or allergy medicines. You may need to avoid medicines that cause your lungs to be dry. Lifestyle  If you smoke, stop. Smoking makes the problem worse. If you need help quitting, ask your doctor.  Avoid being around things that make your breathing worse. This may include smoke, chemicals, and fumes.  Stay active, but remember to rest as well.  Learn and use tips on how to relax.  Make sure you get enough sleep. Most adults need at least 7 hours of sleep every night.  Eat healthy foods. Eat smaller meals more often. Rest before meals. Controlled breathing Learn and use tips on how to control your breathing as told by your doctor. Try:  Breathing in (inhaling) through your nose for 1 second. Then, pucker your lips and breath out (exhale) through your lips for 2 seconds.  Putting one hand on your belly (abdomen). Breathe in slowly through your nose for 1 second. Your hand on your belly should move out. Pucker your lips and breathe out slowly through your lips. Your hand on your belly should move in as you breathe out.  Controlled coughing Learn  and use controlled coughing to clear mucus from your lungs. Follow these steps: 1. Lean your head a little forward. 2. Breathe in deeply. 3. Try to hold your breath for 3 seconds. 4. Keep your mouth slightly open while coughing 2 times. 5. Spit any mucus out into a tissue. 6. Rest and do the steps again 1 or 2 times as needed. General instructions  Make sure you get all the shots (vaccines) that your doctor recommends. Ask your doctor about a flu shot and a pneumonia shot.  Use oxygen therapy and pulmonary rehabilitation if told by your doctor. If you need home oxygen therapy, ask your doctor if you should buy a tool to measure your oxygen level (oximeter).  Make a COPD action plan with your doctor. This helps you to know what to do if you feel worse than usual.  Manage any other conditions you have as told by your doctor.  Avoid going outside when it is very hot, cold, or humid.  Avoid people who have a sickness you can catch (contagious).  Keep all follow-up visits as told by your doctor. This is important. Contact a doctor if:  You cough up more mucus than usual.  There is a change in the color or thickness of the mucus.  It is harder to breathe than usual.  Your breathing is faster than usual.  You have trouble sleeping.  You need to use your medicines more often than usual.  You have trouble doing your normal activities such as getting dressed   or walking around the house. Get help right away if:  You have shortness of breath while resting.  You have shortness of breath that stops you from: ? Being able to talk. ? Doing normal activities.  Your chest hurts for longer than 5 minutes.  Your skin color is more blue than usual.  Your pulse oximeter shows that you have low oxygen for longer than 5 minutes.  You have a fever.  You feel too tired to breathe normally. Summary  Chronic obstructive pulmonary disease (COPD) is a long-term lung problem.  The way your  lungs work will never return to normal. Usually the condition gets worse over time. There are things you can do to keep yourself as healthy as possible.  Take over-the-counter and prescription medicines only as told by your doctor.  If you smoke, stop. Smoking makes the problem worse. This information is not intended to replace advice given to you by your health care provider. Make sure you discuss any questions you have with your health care provider. Document Released: 09/16/2007 Document Revised: 05/04/2016 Document Reviewed: 05/04/2016 Elsevier Interactive Patient Education  2019 [employer]vier Inc.  

## 2018-05-27 ENCOUNTER — Other Ambulatory Visit: Payer: Self-pay | Admitting: Family Medicine

## 2018-05-27 DIAGNOSIS — E7800 Pure hypercholesterolemia, unspecified: Secondary | ICD-10-CM

## 2018-06-02 DIAGNOSIS — J441 Chronic obstructive pulmonary disease with (acute) exacerbation: Secondary | ICD-10-CM | POA: Diagnosis not present

## 2018-06-04 DIAGNOSIS — J441 Chronic obstructive pulmonary disease with (acute) exacerbation: Secondary | ICD-10-CM | POA: Diagnosis not present

## 2018-06-09 DIAGNOSIS — R202 Paresthesia of skin: Secondary | ICD-10-CM | POA: Diagnosis not present

## 2018-06-30 ENCOUNTER — Telehealth: Payer: Self-pay

## 2018-06-30 NOTE — Telephone Encounter (Signed)
Called to confirm medicare wellness visit appointment for 07/06/2018 at 1:00pm at [facility] and physical with Dr.Crissman at 2:00pm. Patient was also informed the visit will be in the back building at [facility], provided with directions. Patient confirmed appt and verbalized understanding of new location. patient informed to call office directly if this appt needed to be rescheduled

## 2018-07-01 DIAGNOSIS — J441 Chronic obstructive pulmonary disease with (acute) exacerbation: Secondary | ICD-10-CM | POA: Diagnosis not present

## 2018-07-03 DIAGNOSIS — J441 Chronic obstructive pulmonary disease with (acute) exacerbation: Secondary | ICD-10-CM | POA: Diagnosis not present

## 2018-07-04 ENCOUNTER — Telehealth: Payer: Self-pay | Admitting: Family Medicine

## 2018-07-04 NOTE — Telephone Encounter (Signed)
Called and spoke with the patient to cancel her AWV with the [facility] on 07/06/2018 at 1:00 PM.  Patient states she is willing to do the AWV telephonically if the office decides to do so.  Janace Hoard, Care Guide.

## 2018-07-06 ENCOUNTER — Ambulatory Visit (INDEPENDENT_AMBULATORY_CARE_PROVIDER_SITE_OTHER): Payer: PPO | Admitting: Family Medicine

## 2018-07-06 ENCOUNTER — Encounter: Payer: Self-pay | Admitting: Family Medicine

## 2018-07-06 ENCOUNTER — Ambulatory Visit: Payer: Self-pay

## 2018-07-06 DIAGNOSIS — G8929 Other chronic pain: Secondary | ICD-10-CM | POA: Diagnosis not present

## 2018-07-06 DIAGNOSIS — E7800 Pure hypercholesterolemia, unspecified: Secondary | ICD-10-CM

## 2018-07-06 DIAGNOSIS — J449 Chronic obstructive pulmonary disease, unspecified: Secondary | ICD-10-CM | POA: Diagnosis not present

## 2018-07-06 DIAGNOSIS — I10 Essential (primary) hypertension: Secondary | ICD-10-CM | POA: Diagnosis not present

## 2018-07-06 DIAGNOSIS — M25512 Pain in left shoulder: Secondary | ICD-10-CM

## 2018-07-06 DIAGNOSIS — Z79891 Long term (current) use of opiate analgesic: Secondary | ICD-10-CM | POA: Diagnosis not present

## 2018-07-06 DIAGNOSIS — F329 Major depressive disorder, single episode, unspecified: Secondary | ICD-10-CM

## 2018-07-06 DIAGNOSIS — M5412 Radiculopathy, cervical region: Secondary | ICD-10-CM | POA: Diagnosis not present

## 2018-07-06 DIAGNOSIS — Z79899 Other long term (current) drug therapy: Secondary | ICD-10-CM | POA: Diagnosis not present

## 2018-07-06 DIAGNOSIS — Z5181 Encounter for therapeutic drug level monitoring: Secondary | ICD-10-CM | POA: Diagnosis not present

## 2018-07-06 MED ORDER — ROSUVASTATIN CALCIUM 20 MG PO TABS: 20.0000 mg | ORAL_TABLET | Freq: Every day | ORAL | 3 refills | Status: DC

## 2018-07-06 MED ORDER — ESCITALOPRAM OXALATE 10 MG PO TABS: 10.0000 mg | ORAL_TABLET | Freq: Every day | ORAL | 3 refills | Status: DC

## 2018-07-06 MED ORDER — POTASSIUM CHLORIDE ER 10 MEQ PO TBCR: 10.0000 meq | EXTENDED_RELEASE_TABLET | Freq: Every day | ORAL | 3 refills | Status: DC

## 2018-07-06 NOTE — Assessment & Plan Note (Signed)
Discussed and concern ofer surgery for torn rotator cuff and COPD

## 2018-07-06 NOTE — Addendum Note (Signed)
Addended by: Golden Pop A on: 07/06/2018 02:10 PM   Modules accepted: Orders

## 2018-07-06 NOTE — Progress Notes (Addendum)
There were no vitals taken for this visit.   Subjective:    Patient ID: Joyce Rodriguez, female    DOB: [birth year], 73 y.o.   MRN: [id number]  HPI: Joyce Rodriguez is a 73 y.o. female Virtual Visit via Telephone Note Today's visit performed via telephone visit due to COVID-19 isolation precautions I connected with  Joyce Rodriguez on 07/06/18 at  EDT by telephone and verified that I am speaking with the correct person using two identifiers.   I discussed the limitations, risks, security and privacy concerns of performing an evaluation and management service by telephone and the availability of in person appointments. I also discussed with the patient that there may be a patient responsible charge related to this service. The patient expressed understanding and agreed to proceed.   Discussed with patient medication potassium replacement Patient doing well with potassium replacement on chart review patient's had low potassium long-term will continue at 10 mEq a day. Discussed COPD patient stable with this. Of concern patient has a torn rotator cuff and will need elective surgery at some point.  Reviewed COPD risk and concerns. Reviewed nerves and Lexapro patient stable. Also reviewed Crestor and patient stable. After surgery for reflux patient reflux symptoms have resolved. Relevant past medical, surgical, family and social history reviewed and updated as indicated. Interim medical history since our last visit reviewed. Allergies and medications reviewed and updated.  Review of Systems  Constitutional: Negative.   Respiratory: Negative.   Cardiovascular: Negative.     Per HPI unless specifically indicated above     Objective:    There were no vitals taken for this visit.  Wt Readings from Last 3 Encounters:  05/23/18 128 lb (58.1 kg)  04/21/18 131 lb 9.6 oz (59.7 kg)  03/23/18 132 lb (59.9 kg)    Physical Exam none Results for orders placed or performed in visit on 24/26/83   Basic metabolic panel  Result Value Ref Range   Glucose 111 (H) 65 - 99 mg/dL   BUN 12 8 - 27 mg/dL   Creatinine, Ser 0.73 0.57 - 1.00 mg/dL   GFR calc non Af Amer 83 >59 mL/min/1.73   GFR calc Af Amer 96 >59 mL/min/1.73   BUN/Creatinine Ratio 16 12 - 28   Sodium 135 134 - 144 mmol/L   Potassium 4.6 3.5 - 5.2 mmol/L   Chloride 97 96 - 106 mmol/L   CO2 20 20 - 29 mmol/L   Calcium 10.0 8.7 - 10.3 mg/dL      Assessment & Plan:   Problem List Items Addressed This Visit      Cardiovascular and Mediastinum   Essential hypertension    The current medical regimen is effective;  continue present plan and medications.         Respiratory   COPD (chronic obstructive pulmonary disease) (HCC)    The current medical regimen is effective;  continue present plan and medications.         Other   Major depression, chronic (Chronic)   Hyperlipidemia   Shoulder pain, left    Discussed and concern ofer surgery for torn rotator cuff and COPD        I discussed the assessment and treatment plan with the patient. The patient was provided an opportunity to ask questions and all were answered. The patient agreed with the plan and demonstrated an understanding of the instructions.   The patient was advised to call back or seek an in-person evaluation if the symptoms worsen  or if the condition fails to improve as anticipated.   I provided 21+ minutes of non-face-to-face time during this encounter.  Follow up plan: Return in about 6 months (around 01/06/2019) for Physical Exam.

## 2018-07-06 NOTE — Assessment & Plan Note (Signed)
The current medical regimen is effective;  continue present plan and medications.  

## 2018-07-20 DIAGNOSIS — Z96642 Presence of left artificial hip joint: Secondary | ICD-10-CM | POA: Diagnosis not present

## 2018-07-20 DIAGNOSIS — E1165 Type 2 diabetes mellitus with hyperglycemia: Secondary | ICD-10-CM | POA: Diagnosis not present

## 2018-07-20 DIAGNOSIS — F411 Generalized anxiety disorder: Secondary | ICD-10-CM | POA: Diagnosis not present

## 2018-07-20 DIAGNOSIS — M533 Sacrococcygeal disorders, not elsewhere classified: Secondary | ICD-10-CM | POA: Diagnosis not present

## 2018-07-20 DIAGNOSIS — R000 Tachycardia, unspecified: Secondary | ICD-10-CM | POA: Diagnosis not present

## 2018-07-20 DIAGNOSIS — D509 Iron deficiency anemia, unspecified: Secondary | ICD-10-CM | POA: Diagnosis not present

## 2018-07-20 DIAGNOSIS — J9601 Acute respiratory failure with hypoxia: Secondary | ICD-10-CM | POA: Diagnosis not present

## 2018-07-20 DIAGNOSIS — R7989 Other specified abnormal findings of blood chemistry: Secondary | ICD-10-CM | POA: Diagnosis not present

## 2018-07-20 DIAGNOSIS — K5900 Constipation, unspecified: Secondary | ICD-10-CM | POA: Diagnosis not present

## 2018-07-20 DIAGNOSIS — M25512 Pain in left shoulder: Secondary | ICD-10-CM | POA: Diagnosis not present

## 2018-07-20 DIAGNOSIS — I959 Hypotension, unspecified: Secondary | ICD-10-CM | POA: Diagnosis not present

## 2018-07-20 DIAGNOSIS — Z01419 Encounter for gynecological examination (general) (routine) without abnormal findings: Secondary | ICD-10-CM | POA: Diagnosis not present

## 2018-07-20 DIAGNOSIS — Z23 Encounter for immunization: Secondary | ICD-10-CM | POA: Diagnosis not present

## 2018-07-20 DIAGNOSIS — R82998 Other abnormal findings in urine: Secondary | ICD-10-CM | POA: Diagnosis not present

## 2018-07-20 DIAGNOSIS — M1611 Unilateral primary osteoarthritis, right hip: Secondary | ICD-10-CM | POA: Diagnosis not present

## 2018-08-01 DIAGNOSIS — J441 Chronic obstructive pulmonary disease with (acute) exacerbation: Secondary | ICD-10-CM | POA: Diagnosis not present

## 2018-08-03 DIAGNOSIS — J441 Chronic obstructive pulmonary disease with (acute) exacerbation: Secondary | ICD-10-CM | POA: Diagnosis not present

## 2018-08-10 ENCOUNTER — Other Ambulatory Visit: Payer: Self-pay | Admitting: Internal Medicine

## 2018-08-19 ENCOUNTER — Other Ambulatory Visit: Payer: Self-pay | Admitting: Adult Health

## 2018-08-19 ENCOUNTER — Other Ambulatory Visit: Payer: Self-pay | Admitting: Internal Medicine

## 2018-08-19 DIAGNOSIS — J449 Chronic obstructive pulmonary disease, unspecified: Secondary | ICD-10-CM

## 2018-08-19 MED ORDER — FLUTICASONE-SALMETEROL 250-50 MCG/DOSE IN AEPB: 1.0000 | INHALATION_SPRAY | Freq: Two times a day (BID) | RESPIRATORY_TRACT | 3 refills | Status: DC

## 2018-08-24 DIAGNOSIS — M5412 Radiculopathy, cervical region: Secondary | ICD-10-CM | POA: Diagnosis not present

## 2018-08-24 DIAGNOSIS — Z79899 Other long term (current) drug therapy: Secondary | ICD-10-CM | POA: Diagnosis not present

## 2018-08-31 DIAGNOSIS — J441 Chronic obstructive pulmonary disease with (acute) exacerbation: Secondary | ICD-10-CM | POA: Diagnosis not present

## 2018-09-02 DIAGNOSIS — J441 Chronic obstructive pulmonary disease with (acute) exacerbation: Secondary | ICD-10-CM | POA: Diagnosis not present

## 2018-09-15 DIAGNOSIS — M25512 Pain in left shoulder: Secondary | ICD-10-CM | POA: Diagnosis not present

## 2018-10-01 DIAGNOSIS — J441 Chronic obstructive pulmonary disease with (acute) exacerbation: Secondary | ICD-10-CM | POA: Diagnosis not present

## 2018-10-03 DIAGNOSIS — J441 Chronic obstructive pulmonary disease with (acute) exacerbation: Secondary | ICD-10-CM | POA: Diagnosis not present

## 2018-10-27 ENCOUNTER — Other Ambulatory Visit: Payer: Self-pay

## 2018-10-27 ENCOUNTER — Ambulatory Visit (INDEPENDENT_AMBULATORY_CARE_PROVIDER_SITE_OTHER): Payer: PPO | Admitting: Internal Medicine

## 2018-10-27 ENCOUNTER — Encounter: Payer: Self-pay | Admitting: Internal Medicine

## 2018-10-27 VITALS — BP 139/82 | HR 103 | Resp 20 | Ht 63.0 in | Wt 132.0 lb

## 2018-10-27 DIAGNOSIS — R0602 Shortness of breath: Secondary | ICD-10-CM | POA: Diagnosis not present

## 2018-10-27 DIAGNOSIS — Z9981 Dependence on supplemental oxygen: Secondary | ICD-10-CM | POA: Diagnosis not present

## 2018-10-27 DIAGNOSIS — J449 Chronic obstructive pulmonary disease, unspecified: Secondary | ICD-10-CM

## 2018-10-27 DIAGNOSIS — K219 Gastro-esophageal reflux disease without esophagitis: Secondary | ICD-10-CM

## 2018-10-27 DIAGNOSIS — J9611 Chronic respiratory failure with hypoxia: Secondary | ICD-10-CM | POA: Diagnosis not present

## 2018-10-27 NOTE — Progress Notes (Signed)
[facility] [place], [place] [address]  Pulmonary Sleep Medicine   Office Visit Note  Patient Name: Joyce Rodriguez DOB: [birth year] MRN [id number]  Date of Service: 10/27/2018  Complaints/HPI: Pt is here for pulmonary follow up.  She reports overall her breathing has been very good. She does reports some sinus issues intermittnelty this spring.  She denies chest pain, overt SOB, hemoptysis or palpitations.      ROS  General: (-) fever, (-) chills, (-) night sweats, (-) weakness Skin: (-) rashes, (-) itching,. Eyes: (-) visual changes, (-) redness, (-) itching. Nose and Sinuses: (-) nasal stuffiness or itchiness, (-) postnasal drip, (-) nosebleeds, (-) sinus trouble. Mouth and Throat: (-) sore throat, (-) hoarseness. Neck: (-) swollen glands, (-) enlarged thyroid, (-) neck pain. Respiratory: - cough, (-) bloody sputum, - shortness of breath, - wheezing. Cardiovascular: - ankle swelling, (-) chest pain. Lymphatic: (-) lymph node enlargement. Neurologic: (-) numbness, (-) tingling. Psychiatric: (-) anxiety, (-) depression   Current Medication: Outpatient Encounter Medications as of 10/27/2018  Medication Sig  . acetaminophen (TYLENOL) 500 MG tablet Take 500 mg by mouth every 6 (six) hours as needed.  Marland Kitchen albuterol (VENTOLIN HFA) 108 (90 Base) MCG/ACT inhaler INHALE 2 PUFFS BY MOUTH EVERY 6 HOURS AS NEEDED  . azithromycin (ZITHROMAX) 250 MG tablet 1 zpk as directed  . cholecalciferol (VITAMIN D) 1000 units tablet Take 1,000 Units by mouth daily.  Marland Kitchen escitalopram (LEXAPRO) 10 MG tablet Take 1 tablet (10 mg total) by mouth daily.  . Fluticasone-Salmeterol (ADVAIR DISKUS) 250-50 MCG/DOSE AEPB Inhale 1 puff into the lungs 2 (two) times daily.  . metoCLOPramide (REGLAN) 5 MG tablet Take 1 tablet (5 mg total) by mouth every 8 (eight) hours as needed for nausea.  . Multiple Vitamin (MULTIVITAMIN WITH MINERALS) TABS tablet Take 1 tablet by mouth daily.  . OXYGEN  Inhale 2 L into the lungs.   . potassium chloride (K-DUR) 10 MEQ tablet Take 1 tablet (10 mEq total) by mouth daily.  . rosuvastatin (CRESTOR) 20 MG tablet Take 1 tablet (20 mg total) by mouth daily.  Marland Kitchen tiotropium (SPIRIVA HANDIHALER) 18 MCG inhalation capsule Place 1 capsule (18 mcg total) into inhaler and inhale daily.  . traMADol (ULTRAM) 50 MG tablet TK 1 T PO Q 6 H PRN P  . potassium chloride (K-DUR,KLOR-CON) 10 MEQ tablet Take 10 mEq by mouth once.   . predniSONE (STERAPRED UNI-PAK 21 TAB) 10 MG (21) TBPK tablet 1 dose pack as directed (Patient not taking: Reported on 10/27/2018)   No facility-administered encounter medications on file as of 10/27/2018.     Surgical History: Past Surgical History:  Procedure Laterality Date  . ABDOMINAL HYSTERECTOMY    . CHOLECYSTECTOMY    . ESOPHAGEAL MANOMETRY N/A 09/13/2017   Procedure: ESOPHAGEAL MANOMETRY (EM);  Surgeon: Mauri Pole, MD;  Location: [facility] ENDOSCOPY;  Service: Endoscopy;  Laterality: N/A;  . ESOPHAGOGASTRODUODENOSCOPY (EGD) WITH PROPOFOL N/A 05/11/2017   Procedure: ESOPHAGOGASTRODUODENOSCOPY (EGD) WITH PROPOFOL;  Surgeon: Lin Landsman, MD;  Location: [facility] ENDOSCOPY;  Service: Gastroenterology;  Laterality: N/A;  . FOOT SURGERY    . LAPAROSCOPIC NISSEN FUNDOPLICATION N/A 10/17/9388   Procedure: LAPAROSCOPIC NISSEN FUNDOPLICATION;  Surgeon: Jules Husbands, MD;  Location: [facility] ORS;  Service: General;  Laterality: N/A;  . NISSEN FUNDOPLICATION N/A 3/0/0923   Procedure: NISSEN FUNDOPLICATION;  Surgeon: Jules Husbands, MD;  Location: [facility] ORS;  Service: General;  Laterality: N/A;    Medical History: Past Medical History:  Diagnosis Date  . Arthritis   . Complication of anesthesia    difficulty waking up  . COPD (chronic obstructive pulmonary disease) ([place])   . Dyspnea   . Emphysema of lung ([place])   . GERD (gastroesophageal reflux disease)   . Hyperlipidemia   . Hypertension   . Osteoporosis   . Oxygen deficiency     uses O2 at night    Family History: Family History  Problem Relation Age of Onset  . Ovarian cancer Mother   . Emphysema Father   . Melanoma Sister   . Lung cancer Brother     Social History: Social History   Socioeconomic History  . Marital status: Married    Spouse name: Not on file  . Number of children: Not on file  . Years of education: 19  . Highest education level: 12th grade  Occupational History  . Not on file  Social Needs  . Financial resource strain: Not hard at all  . Food insecurity    Worry: Never true    Inability: Never true  . Transportation needs    Medical: No    Non-medical: No  Tobacco Use  . Smoking status: Former Smoker    Types: Cigarettes    Quit date: 03/18/2001    Years since quitting: 17.6  . Smokeless tobacco: Never Used  Substance and Sexual Activity  . Alcohol use: Yes    Comment: rare  . Drug use: No  . Sexual activity: Not Currently  Lifestyle  . Physical activity    Days per week: 0 days    Minutes per session: 0 min  . Stress: Not at all  Relationships  . Social connections    Talks on phone: More than three times a week    Gets together: More than three times a week    Attends religious service: More than 4 times per year    Active member of club or organization: No    Attends meetings of clubs or organizations: Never    Relationship status: Married  . Intimate partner violence    Fear of current or ex partner: No    Emotionally abused: No    Physically abused: No    Forced sexual activity: No  Other Topics Concern  . Not on file  Social History Narrative  . Not on file    Vital Signs: Blood pressure 139/82, pulse (!) 103, resp. rate 20, height 5\' 3"  (1.6 m), weight 132 lb (59.9 kg), SpO2 95 %.  Examination: General Appearance: The patient is well-developed, well-nourished, and in no distress. Skin: Gross inspection of skin unremarkable. Head: normocephalic, no gross deformities. Eyes: no gross deformities  noted. ENT: ears appear grossly normal no exudates. Neck: Supple. No thyromegaly. No LAD. Respiratory: clear bilaterally. Cardiovascular: Normal S1 and S2 without murmur or rub. Extremities: No cyanosis. pulses are equal. Neurologic: Alert and oriented. No involuntary movements.  LABS: No results found for this or any previous visit (from the past 2160 hour(s)).  Radiology: Dg Chest 2 View  Result Date: 04/21/2018 CLINICAL DATA:  Pt states she has had a productive cough, congestion and SOB for 3 weeks. History of COPD, former smoker, emphysema, HTN. EXAM: CHEST - 2 VIEW COMPARISON:  05/06/2016 FINDINGS: Cardiac silhouette is normal in size. No mediastinal or hilar masses. No convincing adenopathy. Small right mid lung nodule. There are chronic changes of emphysema with prominent lower lung bronchovascular markings. No evidence of pneumonia or pulmonary edema. No pleural effusion or  pneumothorax. Skeletal structures are demineralized but grossly intact. IMPRESSION: 1. No acute cardiopulmonary disease. 2. Small nodule in the right mid lung seen on the frontal view. Given the underlying changes of emphysema/COPD, follow-up chest CT is recommended on a nonemergent basis. Electronically Signed   By: Lajean Manes M.D.   On: 04/21/2018 14:52    No results found.  No results found.    Assessment and Plan: Patient Active Problem List   Diagnosis Date Noted  . Shoulder pain, left 02/02/2018  . S/P Nissen fundoplication (with gastrostomy tube placement) ([place]) 10/19/2017  . CAD (coronary artery disease), native coronary artery 10/18/2017  . Hiatal hernia 10/15/2017  . Abnormal EKG 10/15/2017  . Dysphagia   . Schatzki's ring   . Gastric erosion   . Advanced care planning/counseling discussion 04/21/2017  . Acute on chronic respiratory failure with hypoxia ([place]) 05/08/2016  . COPD exacerbation ([place]) 05/08/2016  . Hyperglycemia 05/08/2016  . Dyspnea 05/06/2016  . Encounter for hepatitis C  screening test for low risk patient 03/19/2016  . Essential hypertension 03/19/2015  . COPD (chronic obstructive pulmonary disease) ([place]) 03/19/2015  . Hyperlipidemia 03/19/2015  . Osteoarthritis of both hands 03/19/2015  . Osteoporosis 03/19/2015  . Acid reflux 03/19/2015  . Major depression, chronic 09/12/2014    1. Chronic respiratory failure with hypoxia (HCC) Continue oxygen therapy.  2. Chronic obstructive pulmonary disease, unspecified COPD type ([place]) Stable, continue inhalers and oxygen as before.   3. Oxygen dependent Continue to use oxygen  4. Gastroesophageal reflux disease without esophagitis Stable, continue present management.   5. Shortness of breath - CT CHEST WO CONTRAST; Future  6. SOB (shortness of breath) - Spirometry with Graph  General Counseling: I have discussed the findings of the evaluation and examination with Dorian Pod.  I have also discussed any further diagnostic evaluation thatmay be needed or ordered today. Sula verbalizes understanding of the findings of todays visit. We also reviewed her medications today and discussed drug interactions and side effects including but not limited excessive drowsiness and altered mental states. We also discussed that there is always a risk not just to her but also people around her. she has been encouraged to call the office with any questions or concerns that should arise related to todays visit.    Time spent: 20 This patient was seen by Orson Gear AGNP-C in Collaboration with Dr. Devona Konig as a part of collaborative care agreement.   I have personally obtained a history, examined the patient, evaluated laboratory and imaging results, formulated the assessment and plan and placed orders.    Allyne Gee, MD [facility] Pulmonary and Critical Care Sleep medicine

## 2018-10-31 DIAGNOSIS — J441 Chronic obstructive pulmonary disease with (acute) exacerbation: Secondary | ICD-10-CM | POA: Diagnosis not present

## 2018-11-02 DIAGNOSIS — J441 Chronic obstructive pulmonary disease with (acute) exacerbation: Secondary | ICD-10-CM | POA: Diagnosis not present

## 2018-11-08 ENCOUNTER — Ambulatory Visit
Admission: RE | Admit: 2018-11-08 | Discharge: 2018-11-08 | Disposition: A | Discharge status: Home / Self Care | Payer: PPO | Source: Ambulatory Visit | Attending: Adult Health | Admitting: Adult Health

## 2018-11-08 ENCOUNTER — Other Ambulatory Visit: Payer: Self-pay

## 2018-11-08 DIAGNOSIS — R0602 Shortness of breath: Secondary | ICD-10-CM | POA: Diagnosis not present

## 2018-11-16 ENCOUNTER — Ambulatory Visit (INDEPENDENT_AMBULATORY_CARE_PROVIDER_SITE_OTHER): Payer: PPO

## 2018-11-16 DIAGNOSIS — Z0000 Encounter for general adult medical examination without abnormal findings: Secondary | ICD-10-CM

## 2018-11-16 NOTE — Progress Notes (Signed)
Subjective:   Joyce Rodriguez is a 73 y.o. female who presents for Medicare Annual (Subsequent) preventive examination.  This visit is being conducted via phone call  - after an attmept to do on video chat - due to the COVID-19 pandemic. This patient has given me verbal consent via phone to conduct this visit, patient states they are participating from their home address. Some vital signs may be absent or patient reported.   Patient identification: identified by name, DOB, and current address.    Review of Systems:  Cardiac Risk Factors include: advanced age (>25men, >65 women);hypertension;dyslipidemia     Objective:     Vitals: There were no vitals taken for this visit.  There is no height or weight on file to calculate BMI.  Advanced Directives 11/16/2018 10/19/2017 10/19/2017 10/19/2017 10/11/2017 05/11/2017 04/19/2017  Does Patient Have a Medical Advance Directive? Yes Yes Yes No No No No  Type of Advance Directive Living will;Healthcare Power of Attorney Living will;Healthcare Power of [place];Living will - - - -  Does patient want to make changes to medical advance directive? - No - Patient declined - - - - Yes (MAU/Ambulatory/Procedural Areas - Information given)  Copy of Healthcare Power of Attorney in Chart? - No - copy requested Yes - - - -  Would patient like information on creating a medical advance directive? - No - Patient declined - No - Patient declined - - -    Tobacco Social History   Tobacco Use  Smoking Status Former Smoker   Types: Cigarettes   Quit date: 03/18/2001   Years since quitting: 17.6  Smokeless Tobacco Never Used     Counseling given: Not Answered   Clinical Intake:  Pre-visit preparation completed: Yes  Pain : No/denies pain     Nutritional Risks: None Diabetes: No  How often do you need to have someone help you when you read instructions, pamphlets, or other written materials from your doctor or pharmacy?: 1 -  Never  Interpreter Needed?: No  Information entered by :: Tyffani Foglesong,LPN  Past Medical History:  Diagnosis Date   Arthritis    Complication of anesthesia    difficulty waking up   COPD (chronic obstructive pulmonary disease) ([place])    Dyspnea    Emphysema of lung ([place])    GERD (gastroesophageal reflux disease)    Hyperlipidemia    Hypertension    Osteoporosis    Oxygen deficiency    uses O2 at night   Past Surgical History:  Procedure Laterality Date   ABDOMINAL HYSTERECTOMY     CHOLECYSTECTOMY     ESOPHAGEAL MANOMETRY N/A 09/13/2017   Procedure: ESOPHAGEAL MANOMETRY (EM);  Surgeon: Mauri Pole, MD;  Location: [facility];  Service: Endoscopy;  Laterality: N/A;   ESOPHAGOGASTRODUODENOSCOPY (EGD) WITH PROPOFOL N/A 05/11/2017   Procedure: ESOPHAGOGASTRODUODENOSCOPY (EGD) WITH PROPOFOL;  Surgeon: Lin Landsman, MD;  Location: [facility] ENDOSCOPY;  Service: Gastroenterology;  Laterality: N/A;   FOOT SURGERY     LAPAROSCOPIC NISSEN FUNDOPLICATION N/A 05/20/348   Procedure: LAPAROSCOPIC NISSEN FUNDOPLICATION;  Surgeon: Jules Husbands, MD;  Location: [facility] ORS;  Service: General;  Laterality: N/A;   NISSEN FUNDOPLICATION N/A 0/12/3816   Procedure: NISSEN FUNDOPLICATION;  Surgeon: Jules Husbands, MD;  Location: [facility] ORS;  Service: General;  Laterality: N/A;   Family History  Problem Relation Age of Onset   Ovarian cancer Mother    Emphysema Father    Melanoma Sister    Lung cancer Brother  Social History   Socioeconomic History   Marital status: Married    Spouse name: Not on file   Number of children: Not on file   Years of education: 12   Highest education level: 12th grade  Occupational History   Occupation: retired  Scientist, product/process development strain: Not hard at all   Food insecurity    Worry: Never true    Inability: Never true   Transportation needs    Medical: No    Non-medical: No  Tobacco Use   Smoking status:  Former Smoker    Types: Cigarettes    Quit date: 03/18/2001    Years since quitting: 17.6   Smokeless tobacco: Never Used  Substance and Sexual Activity   Alcohol use: Yes    Comment: rare   Drug use: No   Sexual activity: Not Currently  Lifestyle   Physical activity    Days per week: 0 days    Minutes per session: 0 min   Stress: Not at all  Relationships   Social connections    Talks on phone: More than three times a week    Gets together: More than three times a week    Attends religious service: More than 4 times per year    Active member of club or organization: No    Attends meetings of clubs or organizations: Never    Relationship status: Married  Other Topics Concern   Not on file  Social History Narrative   Not on file    Outpatient Encounter Medications as of 11/16/2018  Medication Sig   acetaminophen (TYLENOL) 500 MG tablet Take 500 mg by mouth every 6 (six) hours as needed.   albuterol (VENTOLIN HFA) 108 (90 Base) MCG/ACT inhaler INHALE 2 PUFFS BY MOUTH EVERY 6 HOURS AS NEEDED   cholecalciferol (VITAMIN D) 1000 units tablet Take 1,000 Units by mouth daily.   escitalopram (LEXAPRO) 10 MG tablet Take 1 tablet (10 mg total) by mouth daily.   Fluticasone-Salmeterol (ADVAIR DISKUS) 250-50 MCG/DOSE AEPB Inhale 1 puff into the lungs 2 (two) times daily.   Multiple Vitamin (MULTIVITAMIN WITH MINERALS) TABS tablet Take 1 tablet by mouth daily.   OXYGEN Inhale 2 L into the lungs.    potassium chloride (K-DUR) 10 MEQ tablet Take 1 tablet (10 mEq total) by mouth daily.   rosuvastatin (CRESTOR) 20 MG tablet Take 1 tablet (20 mg total) by mouth daily.   tiotropium (SPIRIVA HANDIHALER) 18 MCG inhalation capsule Place 1 capsule (18 mcg total) into inhaler and inhale daily.   traMADol (ULTRAM) 50 MG tablet TK 1 T PO Q 6 H PRN P   [DISCONTINUED] azithromycin (ZITHROMAX) 250 MG tablet 1 zpk as directed (Patient not taking: Reported on 11/16/2018)   [DISCONTINUED]  metoCLOPramide (REGLAN) 5 MG tablet Take 1 tablet (5 mg total) by mouth every 8 (eight) hours as needed for nausea. (Patient not taking: Reported on 11/16/2018)   [DISCONTINUED] potassium chloride (K-DUR,KLOR-CON) 10 MEQ tablet Take 10 mEq by mouth once.    [DISCONTINUED] predniSONE (STERAPRED UNI-PAK 21 TAB) 10 MG (21) TBPK tablet 1 dose pack as directed (Patient not taking: Reported on 10/27/2018)   No facility-administered encounter medications on file as of 11/16/2018.     Activities of Daily Living In your present state of health, do you have any difficulty performing the following activities: 11/16/2018  Hearing? N  Vision? N  Comment reading glasses  Difficulty concentrating or making decisions? N  Walking or climbing stairs?  Y  Comment SOB  Dressing or bathing? N  Doing errands, shopping? N  Preparing Food and eating ? N  Using the Toilet? N  In the past six months, have you accidently leaked urine? N  Do you have problems with loss of bowel control? N  Managing your Medications? N  Managing your Finances? N  Housekeeping or managing your Housekeeping? N  Some recent data might be hidden    Patient Care Team: Guadalupe Maple, MD as PCP - General (Family Medicine) Lollie Sails, MD as Consulting Physician (Gastroenterology) Allyne Gee, MD as Consulting Physician (Internal Medicine)    Assessment:   This is a routine wellness examination for Sema.  Exercise Activities and Dietary recommendations Current Exercise Habits: The patient does not participate in regular exercise at present, Exercise limited by: None identified  Goals     DIET - INCREASE WATER INTAKE     Recommend drinking at least 6-8 glasses of water a day        Fall Risk: Fall Risk  11/16/2018 01/10/2018 09/23/2017 05/24/2017 04/19/2017  Falls in the past year? 0 No No No No    FALL RISK PREVENTION PERTAINING TO THE HOME:  Any stairs in or around the home? No  If so, are there any without  handrails? No   Home free of loose throw rugs in walkways, pet beds, electrical cords, etc? Yes  Adequate lighting in your home to reduce risk of falls? Yes   ASSISTIVE DEVICES UTILIZED TO PREVENT FALLS:  Life alert? No  Use of a cane, walker or w/c? No  Grab bars in the bathroom? yes Shower chair or bench in shower? No  Elevated toilet seat or a handicapped toilet? No   DME ORDERS:  DME order needed?  No   TIMED UP AND GO:  Unable to perform   Depression Screen PHQ 2/9 Scores 11/16/2018 01/10/2018 09/23/2017 05/24/2017  PHQ - 2 Score 0 0 0 0  PHQ- 9 Score - - - -     Cognitive Function     6CIT Screen 04/19/2017  What Year? 0 points  What month? 0 points  What time? 0 points  Count back from 20 0 points  Months in reverse 0 points  Repeat phrase 0 points  Total Score 0    Immunization History  Administered Date(s) Administered   Influenza Inj Mdck Quad Pf 01/10/2018   Influenza, High Dose Seasonal PF 03/19/2016   Influenza-Unspecified 03/13/2014, 02/20/2015   Pneumococcal Conjugate-13 03/13/2014   Pneumococcal-Unspecified 06/27/2001, 01/05/2007   Td 11/01/2004   Tdap 11/07/2015   Zoster 02/09/2008    Qualifies for Shingles Vaccine? Yes  Zostavax completed 02/09/2008. Due for Shingrix. Education has been provided regarding the importance of this vaccine. Pt has been advised to call insurance company to determine out of pocket expense. Advised may also receive vaccine at local pharmacy or Health Dept. Verbalized acceptance and understanding.  Tdap: up to date   Flu Vaccine: due 12/2018  Pneumococcal Vaccine: patient to get booster at next in office visit.   Screening Tests Health Maintenance  Topic Date Due   INFLUENZA VACCINE  11/12/2018   PNA vac Low Risk Adult (2 of 2 - PPSV23) 11/16/2025 (Originally 03/14/2015)   MAMMOGRAM  02/18/2019   COLONOSCOPY  06/15/2021   TETANUS/TDAP  11/06/2025   DEXA SCAN  Completed   Hepatitis C Screening   Completed    Cancer Screenings:  Colorectal Screening: Completed 06/16/2011. Repeat every 10 years  Mammogram: Completed 02/17/2017. Repeat every year. Pt to call and schedule   Bone Density: Completed 03/16/2013.   Lung Cancer Screening: (Low Dose CT Chest recommended if Age 72-80 years, 30 pack-year currently smoking OR have quit w/in 15years.) does not qualify.    Additional Screening:  Hepatitis C Screening: does qualify; Completed 03/19/2016  Vision Screening: Recommended annual ophthalmology exams for early detection of glaucoma and other disorders of the eye. Is the patient up to date with their annual eye exam?  no Who is the provider or what is the name of the office in which the pt attends annual eye exams? [facility]   Dental Screening: Recommended annual dental exams for proper oral hygiene  Community Resource Referral:  CRR required this visit?  No       Plan:  I have personally reviewed and addressed the Medicare Annual Wellness questionnaire and have noted the following in the patients chart:  A. Medical and social history B. Use of alcohol, tobacco or illicit drugs  C. Current medications and supplements D. Functional ability and status E.  Nutritional status F.  Physical activity G. Advance directives H. List of other physicians I.  Hospitalizations, surgeries, and ER visits in previous 12 months J.  [place] such as hearing and vision if needed, cognitive and depression L. Referrals and appointments   In addition, I have reviewed and discussed with patient certain preventive protocols, quality metrics, and best practice recommendations. A written personalized care plan for preventive services as well as general preventive health recommendations were provided to patient.   Signed,    Bevelyn Ngo, LPN  10/15/9161 Nurse Health Advisor   Nurse Notes: none

## 2018-11-16 NOTE — Patient Instructions (Addendum)
Joyce Rodriguez , Thank you for taking time to come for your Medicare Wellness Visit. I appreciate your ongoing commitment to your health goals. Please review the following plan we discussed and let me know if I can assist you in the future.   Screening recommendations/referrals: Colonoscopy: completed 06/16/2011, due 2023 Mammogram: please call [facility] to schedule Bone Density: completed 2014 Recommended yearly ophthalmology/optometry visit for glaucoma screening and checkup Recommended yearly dental visit for hygiene and checkup  Vaccinations: Influenza vaccine: due 12/2018 Pneumococcal vaccine: please get booster at next in office visit  Tdap vaccine: up to date Shingles vaccine: shingrix eligible, check with your insurance company for coverage     Advanced directives: Please bring a copy of your health care power of attorney and living will to the office at your convenience.  Conditions/risks identified: COPD  Next appointment: Follow up in one year for your annual wellness visit.    Preventive Care 37 Years and Older, Female Preventive care refers to lifestyle choices and visits with your health care provider that can promote health and wellness. What does preventive care include?  A yearly physical exam. This is also called an annual well check.  Dental exams once or twice a year.  Routine eye exams. Ask your health care provider how often you should have your eyes checked.  Personal lifestyle choices, including:  Daily care of your teeth and gums.  Regular physical activity.  Eating a healthy diet.  Avoiding tobacco and drug use.  Limiting alcohol use.  Practicing safe sex.  Taking low-dose aspirin every day.  Taking vitamin and mineral supplements as recommended by your health care provider. What happens during an annual well check? The services and screenings done by your health care provider during your annual well check will depend on your age,  overall health, lifestyle risk factors, and family history of disease. Counseling  Your health care provider may ask you questions about your:  Alcohol use.  Tobacco use.  Drug use.  Emotional well-being.  Home and relationship well-being.  Sexual activity.  Eating habits.  History of falls.  Memory and ability to understand (cognition).  Work and work Statistician.  Reproductive health. Screening  You may have the following tests or measurements:  Height, weight, and BMI.  Blood pressure.  Lipid and cholesterol levels. These may be checked every 5 years, or more frequently if you are over 43 years old.  Skin check.  Lung cancer screening. You may have this screening every year starting at age 43 if you have a 30-pack-year history of smoking and currently smoke or have quit within the past 15 years.  Fecal occult blood test (FOBT) of the stool. You may have this test every year starting at age 36.  Flexible sigmoidoscopy or colonoscopy. You may have a sigmoidoscopy every 5 years or a colonoscopy every 10 years starting at age 28.  Hepatitis C blood test.  Hepatitis B blood test.  Sexually transmitted disease (STD) testing.  Diabetes screening. This is done by checking your blood sugar (glucose) after you have not eaten for a while (fasting). You may have this done every 1-3 years.  Bone density scan. This is done to screen for osteoporosis. You may have this done starting at age 8.  Mammogram. This may be done every 1-2 years. Talk to your health care provider about how often you should have regular mammograms. Talk with your health care provider about your test results, treatment options, and if necessary, the need  for more tests. Vaccines  Your health care provider may recommend certain vaccines, such as:  Influenza vaccine. This is recommended every year.  Tetanus, diphtheria, and acellular pertussis (Tdap, Td) vaccine. You may need a Td booster every 10  years.  Zoster vaccine. You may need this after age 31.  Pneumococcal 13-valent conjugate (PCV13) vaccine. One dose is recommended after age 84.  Pneumococcal polysaccharide (PPSV23) vaccine. One dose is recommended after age 53. Talk to your health care provider about which screenings and vaccines you need and how often you need them. This information is not intended to replace advice given to you by your health care provider. Make sure you discuss any questions you have with your health care provider. Document Released: 04/26/2015 Document Revised: 12/18/2015 Document Reviewed: 01/29/2015 Elsevier Interactive Patient Education  2017 [place] Prevention in the Home Falls can cause injuries. They can happen to people of all ages. There are many things you can do to make your home safe and to help prevent falls. What can I do on the outside of my home?  Regularly fix the edges of walkways and driveways and fix any cracks.  Remove anything that might make you trip as you walk through a door, such as a raised step or threshold.  Trim any bushes or trees on the path to your home.  Use bright outdoor lighting.  Clear any walking paths of anything that might make someone trip, such as rocks or tools.  Regularly check to see if handrails are loose or broken. Make sure that both sides of any steps have handrails.  Any raised decks and porches should have guardrails on the edges.  Have any leaves, snow, or ice cleared regularly.  Use sand or salt on walking paths during winter.  Clean up any spills in your garage right away. This includes oil or grease spills. What can I do in the bathroom?  Use night lights.  Install grab bars by the toilet and in the tub and shower. Do not use towel bars as grab bars.  Use non-skid mats or decals in the tub or shower.  If you need to sit down in the shower, use a plastic, non-slip stool.  Keep the floor dry. Clean up any water that  spills on the floor as soon as it happens.  Remove soap buildup in the tub or shower regularly.  Attach bath mats securely with double-sided non-slip rug tape.  Do not have throw rugs and other things on the floor that can make you trip. What can I do in the bedroom?  Use night lights.  Make sure that you have a light by your bed that is easy to reach.  Do not use any sheets or blankets that are too big for your bed. They should not hang down onto the floor.  Have a firm chair that has side arms. You can use this for support while you get dressed.  Do not have throw rugs and other things on the floor that can make you trip. What can I do in the kitchen?  Clean up any spills right away.  Avoid walking on wet floors.  Keep items that you use a lot in easy-to-reach places.  If you need to reach something above you, use a strong step stool that has a grab bar.  Keep electrical cords out of the way.  Do not use floor polish or wax that makes floors slippery. If you must use wax, use  non-skid floor wax.  Do not have throw rugs and other things on the floor that can make you trip. What can I do with my stairs?  Do not leave any items on the stairs.  Make sure that there are handrails on both sides of the stairs and use them. Fix handrails that are broken or loose. Make sure that handrails are as long as the stairways.  Check any carpeting to make sure that it is firmly attached to the stairs. Fix any carpet that is loose or worn.  Avoid having throw rugs at the top or bottom of the stairs. If you do have throw rugs, attach them to the floor with carpet tape.  Make sure that you have a light switch at the top of the stairs and the bottom of the stairs. If you do not have them, ask someone to add them for you. What else can I do to help prevent falls?  Wear shoes that:  Do not have high heels.  Have rubber bottoms.  Are comfortable and fit you well.  Are closed at the  toe. Do not wear sandals.  If you use a stepladder:  Make sure that it is fully opened. Do not climb a closed stepladder.  Make sure that both sides of the stepladder are locked into place.  Ask someone to hold it for you, if possible.  Clearly mark and make sure that you can see:  Any grab bars or handrails.  First and last steps.  Where the edge of each step is.  Use tools that help you move around (mobility aids) if they are needed. These include:  Canes.  Walkers.  Scooters.  Crutches.  Turn on the lights when you go into a dark area. Replace any light bulbs as soon as they burn out.  Set up your furniture so you have a clear path. Avoid moving your furniture around.  If any of your floors are uneven, fix them.  If there are any pets around you, be aware of where they are.  Review your medicines with your doctor. Some medicines can make you feel dizzy. This can increase your chance of falling. Ask your doctor what other things that you can do to help prevent falls. This information is not intended to replace advice given to you by your health care provider. Make sure you discuss any questions you have with your health care provider. Document Released: 01/24/2009 Document Revised: 09/05/2015 Document Reviewed: 05/04/2014 Elsevier Interactive Patient Education  2017 [employer].

## 2018-11-21 ENCOUNTER — Ambulatory Visit: Payer: PPO | Admitting: Internal Medicine

## 2018-11-21 ENCOUNTER — Other Ambulatory Visit: Payer: Self-pay

## 2018-11-21 ENCOUNTER — Encounter: Payer: Self-pay | Admitting: Internal Medicine

## 2018-11-21 VITALS — BP 126/74 | HR 100 | Resp 16 | Ht 63.0 in | Wt 134.4 lb

## 2018-11-21 DIAGNOSIS — J9611 Chronic respiratory failure with hypoxia: Secondary | ICD-10-CM | POA: Diagnosis not present

## 2018-11-21 DIAGNOSIS — Z9981 Dependence on supplemental oxygen: Secondary | ICD-10-CM

## 2018-11-21 DIAGNOSIS — K219 Gastro-esophageal reflux disease without esophagitis: Secondary | ICD-10-CM

## 2018-11-21 DIAGNOSIS — J449 Chronic obstructive pulmonary disease, unspecified: Secondary | ICD-10-CM

## 2018-11-21 NOTE — Progress Notes (Signed)
[facility] [place], [place] [address]  Pulmonary Sleep Medicine   Office Visit Note  Patient Name: Joyce Rodriguez DOB: 23-May-1945 MRN [id number]  Date of Service: 11/21/2018  Complaints/HPI: Pt is here for follow up on CT results.  The CT results are at the bottom of this note.  Overall no new findings.  Pt is doing well.  Continues to report mild cough intermittently.   ROS  General: (-) fever, (-) chills, (-) night sweats, (-) weakness Skin: (-) rashes, (-) itching,. Eyes: (-) visual changes, (-) redness, (-) itching. Nose and Sinuses: (-) nasal stuffiness or itchiness, (-) postnasal drip, (-) nosebleeds, (-) sinus trouble. Mouth and Throat: (-) sore throat, (-) hoarseness. Neck: (-) swollen glands, (-) enlarged thyroid, (-) neck pain. Respiratory: + cough, (-) bloody sputum, - shortness of breath, - wheezing. Cardiovascular: - ankle swelling, (-) chest pain. Lymphatic: (-) lymph node enlargement. Neurologic: (-) numbness, (-) tingling. Psychiatric: (-) anxiety, (-) depression   Current Medication: Outpatient Encounter Medications as of 11/21/2018  Medication Sig Note  . acetaminophen (TYLENOL) 500 MG tablet Take 500 mg by mouth every 6 (six) hours as needed.   Marland Kitchen albuterol (VENTOLIN HFA) 108 (90 Base) MCG/ACT inhaler INHALE 2 PUFFS BY MOUTH EVERY 6 HOURS AS NEEDED   . cholecalciferol (VITAMIN D) 1000 units tablet Take 1,000 Units by mouth daily.   Marland Kitchen escitalopram (LEXAPRO) 10 MG tablet Take 1 tablet (10 mg total) by mouth daily.   . Fluticasone-Salmeterol (ADVAIR DISKUS) 250-50 MCG/DOSE AEPB Inhale 1 puff into the lungs 2 (two) times daily.   . Multiple Vitamin (MULTIVITAMIN WITH MINERALS) TABS tablet Take 1 tablet by mouth daily.   . OXYGEN Inhale 2 L into the lungs.  11/16/2018: 3l at night, 2 or 3/ for portable   . potassium chloride (K-DUR) 10 MEQ tablet Take 1 tablet (10 mEq total) by mouth daily.   . rosuvastatin (CRESTOR) 20 MG tablet Take 1  tablet (20 mg total) by mouth daily.   Marland Kitchen tiotropium (SPIRIVA HANDIHALER) 18 MCG inhalation capsule Place 1 capsule (18 mcg total) into inhaler and inhale daily.   . traMADol (ULTRAM) 50 MG tablet TK 1 T PO Q 6 H PRN P    No facility-administered encounter medications on file as of 11/21/2018.     Surgical History: Past Surgical History:  Procedure Laterality Date  . ABDOMINAL HYSTERECTOMY    . CHOLECYSTECTOMY    . ESOPHAGEAL MANOMETRY N/A 09/13/2017   Procedure: ESOPHAGEAL MANOMETRY (EM);  Surgeon: Mauri Pole, MD;  Location: [facility] ENDOSCOPY;  Service: Endoscopy;  Laterality: N/A;  . ESOPHAGOGASTRODUODENOSCOPY (EGD) WITH PROPOFOL N/A 05/11/2017   Procedure: ESOPHAGOGASTRODUODENOSCOPY (EGD) WITH PROPOFOL;  Surgeon: Lin Landsman, MD;  Location: [facility] ENDOSCOPY;  Service: Gastroenterology;  Laterality: N/A;  . FOOT SURGERY    . LAPAROSCOPIC NISSEN FUNDOPLICATION N/A 7/0/6237   Procedure: LAPAROSCOPIC NISSEN FUNDOPLICATION;  Surgeon: Jules Husbands, MD;  Location: [facility] ORS;  Service: General;  Laterality: N/A;  . NISSEN FUNDOPLICATION N/A 09/12/8313   Procedure: NISSEN FUNDOPLICATION;  Surgeon: Jules Husbands, MD;  Location: [facility] ORS;  Service: General;  Laterality: N/A;    Medical History: Past Medical History:  Diagnosis Date  . Arthritis   . Complication of anesthesia    difficulty waking up  . COPD (chronic obstructive pulmonary disease) ([place])   . Dyspnea   . Emphysema of lung ([place])   . GERD (gastroesophageal reflux disease)   . Hyperlipidemia   . Hypertension   .  Osteoporosis   . Oxygen deficiency    uses O2 at night    Family History: Family History  Problem Relation Age of Onset  . Ovarian cancer Mother   . Emphysema Father   . Melanoma Sister   . Lung cancer Brother     Social History: Social History   Socioeconomic History  . Marital status: Married    Spouse name: Not on file  . Number of children: Not on file  . Years of education: 2  . Highest  education level: 12th grade  Occupational History  . Occupation: retired  Scientific laboratory technician  . Financial resource strain: Not hard at all  . Food insecurity    Worry: Never true    Inability: Never true  . Transportation needs    Medical: No    Non-medical: No  Tobacco Use  . Smoking status: Former Smoker    Types: Cigarettes    Quit date: 03/18/2001    Years since quitting: 17.6  . Smokeless tobacco: Never Used  Substance and Sexual Activity  . Alcohol use: Yes    Comment: rare  . Drug use: No  . Sexual activity: Not Currently  Lifestyle  . Physical activity    Days per week: 0 days    Minutes per session: 0 min  . Stress: Not at all  Relationships  . Social connections    Talks on phone: More than three times a week    Gets together: More than three times a week    Attends religious service: More than 4 times per year    Active member of club or organization: No    Attends meetings of clubs or organizations: Never    Relationship status: Married  . Intimate partner violence    Fear of current or ex partner: No    Emotionally abused: No    Physically abused: No    Forced sexual activity: No  Other Topics Concern  . Not on file  Social History Narrative  . Not on file    Vital Signs: Blood pressure 126/74, pulse 100, resp. rate 16, height 5\' 3"  (1.6 m), weight 134 lb 6.4 oz (61 kg), SpO2 (!) 88 %.  Examination: General Appearance: The patient is well-developed, well-nourished, and in no distress. Skin: Gross inspection of skin unremarkable. Head: normocephalic, no gross deformities. Eyes: no gross deformities noted. ENT: ears appear grossly normal no exudates. Neck: Supple. No thyromegaly. No LAD. Respiratory: clear bilateraly. Cardiovascular: Normal S1 and S2 without murmur or rub. Extremities: No cyanosis. pulses are equal. Neurologic: Alert and oriented. No involuntary movements.  LABS: No results found for this or any previous visit (from the past 2160  hour(s)).  Radiology: Ct Chest Wo Contrast  Result Date: 11/08/2018 CLINICAL DATA:  Shortness of breath, former smoker EXAM: CT CHEST WITHOUT CONTRAST TECHNIQUE: Multidetector CT imaging of the chest was performed following the standard protocol without IV contrast. COMPARISON:  Chest radiograph, 04/21/2018, CT chest, 05/03/2012 FINDINGS: Cardiovascular: Aortic atherosclerosis. Normal heart size. Three-vessel coronary artery calcifications. No pericardial effusion. Mediastinum/Nodes: No enlarged mediastinal, hilar, or axillary lymph nodes. Thyroid gland, trachea, and esophagus demonstrate no significant findings. Lungs/Pleura: Severe centrilobular emphysema. Bandlike consolidations of the medial right middle lobe and lingula (series 3, image 21). Occasional tiny benign pulmonary nodules, stable compared to prior examination dated 05/03/2012. Mild, tubular bronchiectasis throughout. No pleural effusion or pneumothorax. Upper Abdomen: No acute abnormality. Musculoskeletal: No chest wall mass or suspicious bone lesions identified. IMPRESSION: 1.  Severe emphysema.  2. Bandlike consolidations of the medial right middle lobe and lingula (series 3, image 21). These are unchanged compared to prior CT dated 05/03/2012 and consistent with sequelae of prior atypical infection, particularly atypical mycobacterium. 3.  Coronary artery disease and aortic atherosclerosis. Electronically Signed   By: Eddie Candle M.D.   On: 11/08/2018 14:39    No results found.  Ct Chest Wo Contrast  Result Date: 11/08/2018 CLINICAL DATA:  Shortness of breath, former smoker EXAM: CT CHEST WITHOUT CONTRAST TECHNIQUE: Multidetector CT imaging of the chest was performed following the standard protocol without IV contrast. COMPARISON:  Chest radiograph, 04/21/2018, CT chest, 05/03/2012 FINDINGS: Cardiovascular: Aortic atherosclerosis. Normal heart size. Three-vessel coronary artery calcifications. No pericardial effusion. Mediastinum/Nodes:  No enlarged mediastinal, hilar, or axillary lymph nodes. Thyroid gland, trachea, and esophagus demonstrate no significant findings. Lungs/Pleura: Severe centrilobular emphysema. Bandlike consolidations of the medial right middle lobe and lingula (series 3, image 21). Occasional tiny benign pulmonary nodules, stable compared to prior examination dated 05/03/2012. Mild, tubular bronchiectasis throughout. No pleural effusion or pneumothorax. Upper Abdomen: No acute abnormality. Musculoskeletal: No chest wall mass or suspicious bone lesions identified. IMPRESSION: 1.  Severe emphysema. 2. Bandlike consolidations of the medial right middle lobe and lingula (series 3, image 21). These are unchanged compared to prior CT dated 05/03/2012 and consistent with sequelae of prior atypical infection, particularly atypical mycobacterium. 3.  Coronary artery disease and aortic atherosclerosis. Electronically Signed   By: Eddie Candle M.D.   On: 11/08/2018 14:39      Assessment and Plan: Patient Active Problem List   Diagnosis Date Noted  . Shoulder pain, left 02/02/2018  . S/P Nissen fundoplication (with gastrostomy tube placement) ([place]) 10/19/2017  . CAD (coronary artery disease), native coronary artery 10/18/2017  . Hiatal hernia 10/15/2017  . Abnormal EKG 10/15/2017  . Dysphagia   . Schatzki's ring   . Gastric erosion   . Advanced care planning/counseling discussion 04/21/2017  . Acute on chronic respiratory failure with hypoxia ([place]) 05/08/2016  . COPD exacerbation ([place]) 05/08/2016  . Hyperglycemia 05/08/2016  . Dyspnea 05/06/2016  . Encounter for hepatitis C screening test for low risk patient 03/19/2016  . Essential hypertension 03/19/2015  . COPD (chronic obstructive pulmonary disease) ([place]) 03/19/2015  . Hyperlipidemia 03/19/2015  . Osteoarthritis of both hands 03/19/2015  . Osteoporosis 03/19/2015  . Acid reflux 03/19/2015  . Major depression, chronic 09/12/2014    1. Chronic obstructive  pulmonary disease, unspecified COPD type ([place]) Continue to use inhalers as directed.    2. Chronic respiratory failure with hypoxia ([facility]) Continue oxygen use and supportive measures.   3. Oxygen dependent Continue to use oxygen 2.5 lpm.    4. Gastroesophageal reflux disease without esophagitis Stable, not currently on medications.   General Counseling: I have discussed the findings of the evaluation and examination with Dorian Pod.  I have also discussed any further diagnostic evaluation thatmay be needed or ordered today. Meily verbalizes understanding of the findings of todays visit. We also reviewed her medications today and discussed drug interactions and side effects including but not limited excessive drowsiness and altered mental states. We also discussed that there is always a risk not just to her but also people around her. she has been encouraged to call the office with any questions or concerns that should arise related to todays visit.    Time spent: 15 This patient was seen by Orson Gear AGNP-C in Collaboration with Dr. Devona Konig as a part of collaborative care agreement.  I have personally obtained a history, examined the patient, evaluated laboratory and imaging results, formulated the assessment and plan and placed orders.    Allyne Gee, MD [facility] Pulmonary and Critical Care Sleep medicine

## 2018-11-24 ENCOUNTER — Other Ambulatory Visit: Payer: Self-pay

## 2018-11-24 ENCOUNTER — Encounter: Payer: Self-pay | Admitting: Family Medicine

## 2018-11-24 ENCOUNTER — Ambulatory Visit (INDEPENDENT_AMBULATORY_CARE_PROVIDER_SITE_OTHER): Payer: PPO | Admitting: Family Medicine

## 2018-11-24 DIAGNOSIS — E7800 Pure hypercholesterolemia, unspecified: Secondary | ICD-10-CM | POA: Diagnosis not present

## 2018-11-24 DIAGNOSIS — I10 Essential (primary) hypertension: Secondary | ICD-10-CM

## 2018-11-24 DIAGNOSIS — J449 Chronic obstructive pulmonary disease, unspecified: Secondary | ICD-10-CM | POA: Diagnosis not present

## 2018-11-24 DIAGNOSIS — I25118 Atherosclerotic heart disease of native coronary artery with other forms of angina pectoris: Secondary | ICD-10-CM | POA: Diagnosis not present

## 2018-11-24 DIAGNOSIS — Z7189 Other specified counseling: Secondary | ICD-10-CM

## 2018-11-24 DIAGNOSIS — K219 Gastro-esophageal reflux disease without esophagitis: Secondary | ICD-10-CM

## 2018-11-24 DIAGNOSIS — F329 Major depressive disorder, single episode, unspecified: Secondary | ICD-10-CM

## 2018-11-24 MED ORDER — ESCITALOPRAM OXALATE 10 MG PO TABS: 10.0000 mg | ORAL_TABLET | Freq: Every day | ORAL | 4 refills | Status: DC

## 2018-11-24 MED ORDER — ROSUVASTATIN CALCIUM 20 MG PO TABS: 20.0000 mg | ORAL_TABLET | Freq: Every day | ORAL | 4 refills | Status: DC

## 2018-11-24 MED ORDER — POTASSIUM CHLORIDE ER 10 MEQ PO TBCR: 10.0000 meq | EXTENDED_RELEASE_TABLET | Freq: Every day | ORAL | 4 refills | Status: DC

## 2018-11-24 NOTE — Assessment & Plan Note (Signed)
The current medical regimen is effective;  continue present plan and medications.  

## 2018-11-24 NOTE — Assessment & Plan Note (Signed)
A voluntary discussion about advanced care planning including explanation and discussion of advanced directives was extentively discussed with the patient.  Explained about the healthcare proxy and living will was reviewed and packet with forms with expiration of how to fill them out was given.  Time spent: Encounter 16+ min individuals present: Patient 

## 2018-11-24 NOTE — Assessment & Plan Note (Signed)
The current medical regimen is effective;  continue present plan and medications. After surgery resolved

## 2018-11-24 NOTE — Assessment & Plan Note (Signed)
The current medical regimen is effective;  continue present plan and medications. Uses O2 at night

## 2018-11-24 NOTE — Progress Notes (Signed)
BP 122/75    Subjective:    Patient ID: Joyce Rodriguez, female    DOB: 18-Jan-1946, 73 y.o.   MRN: [id number]  HPI: Joyce Rodriguez is a 73 y.o. female  Med check Discussed with patient all in all doing well good reports on CT scan with stability from 2014. Uses oxygen at night for COPD. Blood pressure cholesterol all stable. No complaints from medications.  Relevant past medical, surgical, family and social history reviewed and updated as indicated. Interim medical history since our last visit reviewed. Allergies and medications reviewed and updated.  Review of Systems  Constitutional: Negative.   HENT: Negative.   Eyes: Negative.   Respiratory: Negative.   Cardiovascular: Negative.   Gastrointestinal: Negative.   Endocrine: Negative.   Genitourinary: Negative.   Musculoskeletal: Negative.   Skin: Negative.   Allergic/Immunologic: Negative.   Neurological: Negative.   Hematological: Negative.   Psychiatric/Behavioral: Negative.     Per HPI unless specifically indicated above     Objective:    BP 122/75   Wt Readings from Last 3 Encounters:  11/21/18 134 lb 6.4 oz (61 kg)  10/27/18 132 lb (59.9 kg)  05/23/18 128 lb (58.1 kg)    Physical Exam  Results for orders placed or performed in visit on 01/60/10  Basic metabolic panel  Result Value Ref Range   Glucose 111 (H) 65 - 99 mg/dL   BUN 12 8 - 27 mg/dL   Creatinine, Ser 0.73 0.57 - 1.00 mg/dL   GFR calc non Af Amer 83 >59 mL/min/1.73   GFR calc Af Amer 96 >59 mL/min/1.73   BUN/Creatinine Ratio 16 12 - 28   Sodium 135 134 - 144 mmol/L   Potassium 4.6 3.5 - 5.2 mmol/L   Chloride 97 96 - 106 mmol/L   CO2 20 20 - 29 mmol/L   Calcium 10.0 8.7 - 10.3 mg/dL      Assessment & Plan:   Problem List Items Addressed This Visit      Cardiovascular and Mediastinum   Essential hypertension    The current medical regimen is effective;  continue present plan and medications.       Relevant Medications   rosuvastatin (CRESTOR) 20 MG tablet   potassium chloride (K-DUR) 10 MEQ tablet   Other Relevant Orders   Comprehensive metabolic panel   CBC with Differential/Platelet   TSH   Urinalysis, Routine w reflex microscopic     Respiratory   COPD (chronic obstructive pulmonary disease) ([place])    The current medical regimen is effective;  continue present plan and medications. Uses O2 at night        Digestive   Acid reflux    The current medical regimen is effective;  continue present plan and medications. After surgery resolved        Other   Major depression, chronic (Chronic)   Relevant Medications   escitalopram (LEXAPRO) 10 MG tablet   Hyperlipidemia    The current medical regimen is effective;  continue present plan and medications.       Relevant Medications   rosuvastatin (CRESTOR) 20 MG tablet   potassium chloride (K-DUR) 10 MEQ tablet   Other Relevant Orders   Comprehensive metabolic panel   Lipid panel   CBC with Differential/Platelet   TSH   Urinalysis, Routine w reflex microscopic   Advanced care planning/counseling discussion    A voluntary discussion about advanced care planning including explanation and discussion of advanced directives was extentively discussed with the patient.  Explained about the healthcare proxy and living will was reviewed and packet with forms with expiration of how to fill them out was given.  Time spent: Encounter 16+ min individuals present: Patient      CAD (coronary artery disease), native coronary artery    The current medical regimen is effective;  continue present plan and medications.           Follow up plan: Return for Physical Exam soon and 6 mo f/u, BMP,  Lipids, ALT, AST.

## 2018-12-01 DIAGNOSIS — J441 Chronic obstructive pulmonary disease with (acute) exacerbation: Secondary | ICD-10-CM | POA: Diagnosis not present

## 2018-12-03 DIAGNOSIS — J441 Chronic obstructive pulmonary disease with (acute) exacerbation: Secondary | ICD-10-CM | POA: Diagnosis not present

## 2018-12-13 DIAGNOSIS — D1721 Benign lipomatous neoplasm of skin and subcutaneous tissue of right arm: Secondary | ICD-10-CM | POA: Diagnosis not present

## 2019-01-01 DIAGNOSIS — J441 Chronic obstructive pulmonary disease with (acute) exacerbation: Secondary | ICD-10-CM | POA: Diagnosis not present

## 2019-01-02 ENCOUNTER — Other Ambulatory Visit: Payer: Self-pay

## 2019-01-02 ENCOUNTER — Encounter: Payer: Self-pay | Admitting: Surgery

## 2019-01-02 ENCOUNTER — Ambulatory Visit (INDEPENDENT_AMBULATORY_CARE_PROVIDER_SITE_OTHER): Payer: PPO | Admitting: Surgery

## 2019-01-02 VITALS — BP 148/93 | HR 97 | Temp 97.7°F | Ht 66.0 in | Wt 134.0 lb

## 2019-01-02 DIAGNOSIS — D1721 Benign lipomatous neoplasm of skin and subcutaneous tissue of right arm: Secondary | ICD-10-CM

## 2019-01-02 NOTE — Patient Instructions (Addendum)
Patient to have a right shoulder ultrasound. @ [place] 1:45pm on 01/05/2019.

## 2019-01-03 ENCOUNTER — Encounter: Payer: Self-pay | Admitting: Surgery

## 2019-01-03 DIAGNOSIS — J441 Chronic obstructive pulmonary disease with (acute) exacerbation: Secondary | ICD-10-CM | POA: Diagnosis not present

## 2019-01-03 NOTE — Progress Notes (Signed)
Outpatient Surgical Follow Up  01/03/2019  Joyce Rodriguez is an 73 y.o. female.   Chief Complaint  Patient presents with  . Lipoma    HPI: Joyce Rodriguez is very pleasant 72 year old female well-known to me with a prior history of laparoscopic/robotic Nissen fundoplication for severe GERD and chronic pulmonary issues.  She is doing great from that perspective and has no dysphasia and no reflux.  Her pulmonary symptoms have improved tremendously.  Today she comes for a right shoulder mass.  She reports that over the last few weeks or months it has increased in size.  It bothers her when she wears a bra.  No pain no fevers no chills. I did review her CT scan of the abdomen and chest showing good fundoplication there is no recurrence of the hiatal hernia.  No acute intra-abdominal abnormalities.  Regarding the shoulder it is limited and I cannot completely exclude soft tissue mass in the right shoulder.  Past Medical History:  Diagnosis Date  . Arthritis   . Complication of anesthesia    difficulty waking up  . COPD (chronic obstructive pulmonary disease) ([place])   . Dyspnea   . Emphysema of lung ([place])   . GERD (gastroesophageal reflux disease)   . Hyperlipidemia   . Hypertension   . Osteoporosis   . Oxygen deficiency    uses O2 at night    Past Surgical History:  Procedure Laterality Date  . ABDOMINAL HYSTERECTOMY    . CHOLECYSTECTOMY    . ESOPHAGEAL MANOMETRY N/A 09/13/2017   Procedure: ESOPHAGEAL MANOMETRY (EM);  Surgeon: Mauri Pole, MD;  Location: [facility]OS[facility];  Service: Endoscopy;  Laterality: N/A;  . ESOPHAGOGASTRODUODENOSCOPY (EGD) WITH PROPOFOL N/A 05/11/2017   Procedure: ESOPHAGOGASTRODUODENOSCOPY (EGD) WITH PROPOFOL;  Surgeon: Lin Landsman, MD;  Location: [facility] ENDOSCOPY;  Service: Gastroenterology;  Laterality: N/A;  . FOOT SURGERY    . LAPAROSCOPIC NISSEN FUNDOPLICATION N/A [id number]   Procedure: LAPAROSCOPIC NISSEN FUNDOPLICATION;  Surgeon: Jules Husbands, MD;   Location: [facility] ORS;  Service: General;  Laterality: N/A;  . NISSEN FUNDOPLICATION N/A [id number]   Procedure: NISSEN FUNDOPLICATION;  Surgeon: Jules Husbands, MD;  Location: [facility] ORS;  Service: General;  Laterality: N/A;    Family History  Problem Relation Age of Onset  . Ovarian cancer Mother   . Emphysema Father   . Melanoma Sister   . Lung cancer Brother     Social History:  reports that she quit smoking about 17 years ago. Her smoking use included cigarettes. She has never used smokeless tobacco. She reports current alcohol use. She reports that she does not use drugs.  Allergies:  Allergies  Allergen Reactions  . Codeine Nausea And Vomiting    Pt states that she can tolerate Tramadol fine.    Medications reviewed.    ROS Full ROS performed and is otherwise negative other than what is stated in HPI   BP (!) 148/93   Pulse 97   Temp 97.7 F (36.5 C) (Skin)   Ht 5\' 6"  (1.676 m)   Wt 134 lb (60.8 kg)   BMI 21.63 kg/m   Physical Exam Vitals signs and nursing note reviewed. Exam conducted with a chaperone present.  Constitutional:      General: She is not in acute distress.    Appearance: Normal appearance. She is normal weight.  Eyes:     General: No scleral icterus.       Right eye: No discharge.        Left  eye: No discharge.  Neck:     Musculoskeletal: Normal range of motion. No neck rigidity or muscular tenderness.     Vascular: No carotid bruit.  Cardiovascular:     Rate and Rhythm: Normal rate and regular rhythm.  Pulmonary:     Effort: Pulmonary effort is normal. No respiratory distress.     Breath sounds: Normal breath sounds. No stridor.  Abdominal:     General: Abdomen is flat. There is no distension.     Palpations: There is no mass.     Tenderness: There is no abdominal tenderness.     Hernia: No hernia is present.  Musculoskeletal: Normal range of motion.        General: No swelling.  Lymphadenopathy:     Cervical: No cervical adenopathy.   Skin:    General: Skin is warm and dry.     Capillary Refill: Capillary refill takes less than 2 seconds.     Comments: There is evidence of a soft tissue mass involving the right shoulder it is mobile and soft consistent with a lipoma.  I cannot completely exclude potential synovial cyst  Neurological:     General: No focal deficit present.     Mental Status: She is alert and oriented to person, place, and time.  Psychiatric:        Mood and Affect: Mood normal.        Behavior: Behavior normal.        Thought Content: Thought content normal.        Judgment: Judgment normal.     Assessment/Plan: 73 year old female with clinical findings consistent with lipoma.  Given the proximity to the joint I will obtain an ultrasound to make sure this is not fluid from the joint.  She will likely benefit from excision here in the office given the rapid progression and growth.   Greater than 50% of the  25 minutes  visit was spent in counseling/coordination of care   Caroleen Hamman, MD [facility]

## 2019-01-05 ENCOUNTER — Ambulatory Visit
Admission: RE | Admit: 2019-01-05 | Discharge: 2019-01-05 | Disposition: A | Discharge status: Home / Self Care | Payer: PPO | Source: Ambulatory Visit | Attending: Surgery | Admitting: Surgery

## 2019-01-05 ENCOUNTER — Other Ambulatory Visit: Payer: Self-pay

## 2019-01-05 DIAGNOSIS — D1721 Benign lipomatous neoplasm of skin and subcutaneous tissue of right arm: Secondary | ICD-10-CM | POA: Diagnosis not present

## 2019-01-05 DIAGNOSIS — R2231 Localized swelling, mass and lump, right upper limb: Secondary | ICD-10-CM | POA: Diagnosis not present

## 2019-01-06 ENCOUNTER — Telehealth: Payer: Self-pay

## 2019-01-06 NOTE — Telephone Encounter (Signed)
Per Dr Dahlia Byes nothing of concern found on imaging of shoulder. Follow up as scheduled for removal of lipoma in office.

## 2019-01-16 ENCOUNTER — Ambulatory Visit: Payer: PPO | Admitting: Surgery

## 2019-01-18 ENCOUNTER — Encounter: Payer: Self-pay | Admitting: Family Medicine

## 2019-01-20 ENCOUNTER — Ambulatory Visit (INDEPENDENT_AMBULATORY_CARE_PROVIDER_SITE_OTHER): Payer: PPO | Admitting: Nurse Practitioner

## 2019-01-20 ENCOUNTER — Encounter: Payer: Self-pay | Admitting: Nurse Practitioner

## 2019-01-20 ENCOUNTER — Other Ambulatory Visit: Payer: Self-pay

## 2019-01-20 VITALS — HR 103 | Temp 98.4°F

## 2019-01-20 DIAGNOSIS — Z9981 Dependence on supplemental oxygen: Secondary | ICD-10-CM | POA: Diagnosis not present

## 2019-01-20 DIAGNOSIS — J441 Chronic obstructive pulmonary disease with (acute) exacerbation: Secondary | ICD-10-CM

## 2019-01-20 MED ORDER — LEVOFLOXACIN 500 MG PO TABS: 500.0000 mg | ORAL_TABLET | Freq: Every day | ORAL | 0 refills | Status: DC

## 2019-01-20 MED ORDER — PREDNISONE 10 MG (21) PO TBPK: ORAL_TABLET | ORAL | 0 refills | Status: DC

## 2019-01-20 NOTE — Progress Notes (Signed)
[facility] [place], [place] [address]  Internal MEDICINE  Telephone Visit  Patient Name: Joyce Rodriguez  [id number]  [id number]  Date of Service: 01/30/2019  I connected with the patient at 1:52pm by telephone and verified the patients identity using two identifiers.   I discussed the limitations, risks, security and privacy concerns of performing an evaluation and management service by telephone and the availability of in person appointments. I also discussed with the patient that there may be a patient responsible charge related to the service.  The patient expressed understanding and agrees to proceed.    Chief Complaint  Patient presents with  . Telephone Assessment  . Telephone Screen  . Shortness of Breath  . Nasal Congestion  . Headache    just a little headache, feels full    The patient has been contacted via telephone for follow up visit due to concerns for spread of novel coronavirus. The patient is complaining of congestion, mild sore throat, lots of post nasal drip. She states that she is starting to develop a cough which is nonproductive. She has mild shortness of breath. She does not have fever and has been checking this frequently. She has been taking mucinex and OTC sinus relief medication which are not helping.       Current Medication: Outpatient Encounter Medications as of 01/20/2019  Medication Sig Note  . acetaminophen (TYLENOL) 500 MG tablet Take 500 mg by mouth every 6 (six) hours as needed.   Marland Kitchen albuterol (VENTOLIN HFA) 108 (90 Base) MCG/ACT inhaler INHALE 2 PUFFS BY MOUTH EVERY 6 HOURS AS NEEDED   . cholecalciferol (VITAMIN D) 1000 units tablet Take 1,000 Units by mouth daily.   Marland Kitchen escitalopram (LEXAPRO) 10 MG tablet Take 1 tablet (10 mg total) by mouth daily.   . Fluticasone-Salmeterol (ADVAIR DISKUS) 250-50 MCG/DOSE AEPB Inhale 1 puff into the lungs 2 (two) times daily.   . Multiple Vitamin (MULTIVITAMIN WITH MINERALS) TABS  tablet Take 1 tablet by mouth daily.   . OXYGEN Inhale 2 L into the lungs.  11/16/2018: 3l at night, 2 or 3/ for portable   . potassium chloride (K-DUR) 10 MEQ tablet Take 1 tablet (10 mEq total) by mouth daily.   Marland Kitchen tiotropium (SPIRIVA HANDIHALER) 18 MCG inhalation capsule Place 1 capsule (18 mcg total) into inhaler and inhale daily.   . traMADol (ULTRAM) 50 MG tablet TK 1 T PO Q 6 H PRN P   . [DISCONTINUED] rosuvastatin (CRESTOR) 20 MG tablet Take 1 tablet (20 mg total) by mouth daily. (Patient not taking: Reported on 01/23/2019)   . levofloxacin (LEVAQUIN) 500 MG tablet Take 1 tablet (500 mg total) by mouth daily.   . [DISCONTINUED] predniSONE (STERAPRED UNI-PAK 21 TAB) 10 MG (21) TBPK tablet 6 day taper - take by mouth as directed for 6 days    No facility-administered encounter medications on file as of 01/20/2019.     Surgical History: Past Surgical History:  Procedure Laterality Date  . ABDOMINAL HYSTERECTOMY    . CHOLECYSTECTOMY    . ESOPHAGEAL MANOMETRY N/A 09/13/2017   Procedure: ESOPHAGEAL MANOMETRY (EM);  Surgeon: Mauri Pole, MD;  Location: [facility] ENDOSCOPY;  Service: Endoscopy;  Laterality: N/A;  . ESOPHAGOGASTRODUODENOSCOPY (EGD) WITH PROPOFOL N/A 05/11/2017   Procedure: ESOPHAGOGASTRODUODENOSCOPY (EGD) WITH PROPOFOL;  Surgeon: Lin Landsman, MD;  Location: [facility] ENDOSCOPY;  Service: Gastroenterology;  Laterality: N/A;  . FOOT SURGERY    . LAPAROSCOPIC NISSEN FUNDOPLICATION N/A [id number]   Procedure: LAPAROSCOPIC  NISSEN FUNDOPLICATION;  Surgeon: Jules Husbands, MD;  Location: [facility] ORS;  Service: General;  Laterality: N/A;  . NISSEN FUNDOPLICATION N/A [id number]   Procedure: NISSEN FUNDOPLICATION;  Surgeon: Jules Husbands, MD;  Location: [facility] ORS;  Service: General;  Laterality: N/A;    Medical History: Past Medical History:  Diagnosis Date  . Arthritis   . Complication of anesthesia    difficulty waking up  . COPD (chronic obstructive pulmonary disease) ([place])   .  Dyspnea   . Emphysema of lung ([place])   . GERD (gastroesophageal reflux disease)   . Hyperlipidemia   . Hypertension   . Osteoporosis   . Oxygen deficiency    uses O2 at night    Family History: Family History  Problem Relation Age of Onset  . Ovarian cancer Mother   . Emphysema Father   . Melanoma Sister   . Lung cancer Brother     Social History   Socioeconomic History  . Marital status: Married    Spouse name: Not on file  . Number of children: Not on file  . Years of education: 1  . Highest education level: 12th grade  Occupational History  . Occupation: retired  Scientific laboratory technician  . Financial resource strain: Not hard at all  . Food insecurity    Worry: Never true    Inability: Never true  . Transportation needs    Medical: No    Non-medical: No  Tobacco Use  . Smoking status: Former Smoker    Types: Cigarettes    Quit date: 03/18/2001    Years since quitting: 17.8  . Smokeless tobacco: Never Used  Substance and Sexual Activity  . Alcohol use: Yes    Comment: rare  . Drug use: No  . Sexual activity: Not Currently  Lifestyle  . Physical activity    Days per week: 0 days    Minutes per session: 0 min  . Stress: Not at all  Relationships  . Social connections    Talks on phone: More than three times a week    Gets together: More than three times a week    Attends religious service: More than 4 times per year    Active member of club or organization: No    Attends meetings of clubs or organizations: Never    Relationship status: Married  . Intimate partner violence    Fear of current or ex partner: No    Emotionally abused: No    Physically abused: No    Forced sexual activity: No  Other Topics Concern  . Not on file  Social History Narrative  . Not on file      Review of Systems  Constitutional: Positive for fatigue. Negative for activity change, chills and fever.  HENT: Positive for congestion, postnasal drip and rhinorrhea. Negative for sore  throat.   Respiratory: Positive for cough, shortness of breath and wheezing.   Cardiovascular: Negative for chest pain and palpitations.  Gastrointestinal: Negative.  Negative for nausea.  Musculoskeletal: Positive for myalgias.  Allergic/Immunologic: Positive for environmental allergies.  Neurological: Positive for headaches.    Today's Vitals   01/20/19 1323 01/20/19 1327  Pulse: (!) 103   Temp: 98.4 F (36.9 C)   SpO2: 92% 95%   There is no height or weight on file to calculate BMI.  Observation/Objective:   The patient is alert and oriented. She is pleasant and answers all questions appropriately. Breathing is non-labored. She is in no acute distress at  this time. She is congested with congested, non-productive cough present.    Assessment/Plan:  1. Obstructive chronic bronchitis with exacerbation (HCC) Start levofloxacin 500mg  daily for 10 days. Rest andi ncrease fluids. Use inhalers and respiratory mediatios as prescribed. Prednisone taper also added. Take as directed for 6 days.  - levofloxacin (LEVAQUIN) 500 MG tablet; Take 1 tablet (500 mg total) by mouth daily.  Dispense: 10 tablet; Refill: 0  2. Oxygen dependent Use oxygen as prescribed    General Counseling: khayla vorbeck understanding of the findings of today's phone visit and agrees with plan of treatment. I have discussed any further diagnostic evaluation that may be needed or ordered today. We also reviewed her medications today. she has been encouraged to call the office with any questions or concerns that should arise related to todays visit.   This patient was seen by [place] with Dr Lavera Guise as a part of collaborative care agreement  Meds ordered this encounter  Medications  . levofloxacin (LEVAQUIN) 500 MG tablet    Sig: Take 1 tablet (500 mg total) by mouth daily.    Dispense:  10 tablet    Refill:  0    Order Specific Question:   Supervising Provider    Answer:    Lavera Guise [id number]  . DISCONTD: predniSONE (STERAPRED UNI-PAK 21 TAB) 10 MG (21) TBPK tablet    Sig: 6 day taper - take by mouth as directed for 6 days    Dispense:  21 tablet    Refill:  0    Order Specific Question:   Supervising Provider    Answer:   Lavera Guise [id number]    Time spent: 25 Minutes    Dr Lavera Guise Internal medicine

## 2019-01-23 ENCOUNTER — Other Ambulatory Visit: Payer: Self-pay

## 2019-01-23 ENCOUNTER — Encounter: Payer: Self-pay | Admitting: Surgery

## 2019-01-23 ENCOUNTER — Encounter: Payer: Self-pay | Admitting: Family Medicine

## 2019-01-23 ENCOUNTER — Ambulatory Visit: Payer: PPO | Admitting: Surgery

## 2019-01-23 ENCOUNTER — Ambulatory Visit (INDEPENDENT_AMBULATORY_CARE_PROVIDER_SITE_OTHER): Payer: PPO | Admitting: Family Medicine

## 2019-01-23 VITALS — BP 130/82 | HR 94 | Temp 98.5°F | Ht 61.7 in | Wt 136.8 lb

## 2019-01-23 VITALS — BP 124/79 | HR 92 | Temp 97.7°F | Ht 62.0 in | Wt 136.6 lb

## 2019-01-23 DIAGNOSIS — Z1231 Encounter for screening mammogram for malignant neoplasm of breast: Secondary | ICD-10-CM

## 2019-01-23 DIAGNOSIS — E7800 Pure hypercholesterolemia, unspecified: Secondary | ICD-10-CM

## 2019-01-23 DIAGNOSIS — I10 Essential (primary) hypertension: Secondary | ICD-10-CM

## 2019-01-23 DIAGNOSIS — K5900 Constipation, unspecified: Secondary | ICD-10-CM

## 2019-01-23 DIAGNOSIS — D1721 Benign lipomatous neoplasm of skin and subcutaneous tissue of right arm: Secondary | ICD-10-CM

## 2019-01-23 DIAGNOSIS — Z0000 Encounter for general adult medical examination without abnormal findings: Secondary | ICD-10-CM

## 2019-01-23 LAB — URINALYSIS, ROUTINE W REFLEX MICROSCOPIC
Bilirubin, UA: NEGATIVE
Glucose, UA: NEGATIVE
Ketones, UA: NEGATIVE
Leukocytes,UA: NEGATIVE
Nitrite, UA: NEGATIVE
Protein,UA: NEGATIVE
RBC, UA: NEGATIVE
Specific Gravity, UA: 1.015 (ref 1.005–1.030)
Urobilinogen, Ur: 0.2 mg/dL (ref 0.2–1.0)
pH, UA: 5.5 (ref 5.0–7.5)

## 2019-01-23 MED ORDER — PRAVASTATIN SODIUM 40 MG PO TABS: 40.0000 mg | ORAL_TABLET | Freq: Every day | ORAL | 1 refills | Status: DC

## 2019-01-23 NOTE — Assessment & Plan Note (Signed)
Myalgias with lipitor and crestor, will try pravastatin and if not tolerable will switch to alternative therapy. Monitor closely for tolerance. Continue lifestyle modifications

## 2019-01-23 NOTE — Progress Notes (Signed)
BP 130/82   Pulse 94   Temp 98.5 F (36.9 C) (Oral)   Ht 5' 1.7" (1.567 m)   Wt 136 lb 12.8 oz (62.1 kg)   SpO2 94%   BMI 25.26 kg/m    Subjective:    Patient ID: Joyce Rodriguez, female    DOB: 03-08-1946, 73 y.o.   MRN: [id number]  HPI: Joyce Rodriguez is a 73 y.o. female presenting on 01/23/2019 for comprehensive medical examination. Current medical complaints include:see below  Home BPs have been stable and WNL typically, but on prednisone the past few days for COPD exacerbation and they may be up from that per patient. Taking medications faithfully without side effects. Denies CP, HAs, dizziness.   Had some issues with lipitor years ago with leg cramps. Now after several years on the crestor started having back and leg cramps the past few weeks. Stopped the crestor about a week ago and leg cramps dissipated. Wanting to try something else. Careful with her diet and stays active.   Also struggling off and on with constipation. Tried miralax after a recent surgery and couldn't tolerate the consistency. Drinks lots of fluids.   She currently lives with: Menopausal Symptoms: no  Depression Screen done today and results listed below:  Depression screen [facility] 2/9 01/23/2019 11/16/2018 01/10/2018 09/23/2017 05/24/2017  Decreased Interest 0 0 0 0 0  Down, Depressed, Hopeless 0 0 0 0 0  PHQ - 2 Score 0 0 0 0 0  Altered sleeping 0 - - - -  Tired, decreased energy 0 - - - -  Change in appetite 0 - - - -  Feeling bad or failure about yourself  0 - - - -  Trouble concentrating 0 - - - -  Moving slowly or fidgety/restless 0 - - - -  Suicidal thoughts 0 - - - -  PHQ-9 Score 0 - - - -  Difficult doing work/chores Not difficult at all - - - -    The patient does not have a history of falls. I did complete a risk assessment for falls. A plan of care for falls was documented.   Past Medical History:  Past Medical History:  Diagnosis Date  . Arthritis   . Complication of anesthesia    difficulty waking up  . COPD (chronic obstructive pulmonary disease) ([place])   . Dyspnea   . Emphysema of lung ([place])   . GERD (gastroesophageal reflux disease)   . Hyperlipidemia   . Hypertension   . Osteoporosis   . Oxygen deficiency    uses O2 at night    Surgical History:  Past Surgical History:  Procedure Laterality Date  . ABDOMINAL HYSTERECTOMY    . CHOLECYSTECTOMY    . ESOPHAGEAL MANOMETRY N/A 09/13/2017   Procedure: ESOPHAGEAL MANOMETRY (EM);  Surgeon: Mauri Pole, MD;  Location: [facility] ENDOSCOPY;  Service: Endoscopy;  Laterality: N/A;  . ESOPHAGOGASTRODUODENOSCOPY (EGD) WITH PROPOFOL N/A 05/11/2017   Procedure: ESOPHAGOGASTRODUODENOSCOPY (EGD) WITH PROPOFOL;  Surgeon: Lin Landsman, MD;  Location: [facility] ENDOSCOPY;  Service: Gastroenterology;  Laterality: N/A;  . FOOT SURGERY    . LAPAROSCOPIC NISSEN FUNDOPLICATION N/A [id number]   Procedure: LAPAROSCOPIC NISSEN FUNDOPLICATION;  Surgeon: Jules Husbands, MD;  Location: [facility] ORS;  Service: General;  Laterality: N/A;  . NISSEN FUNDOPLICATION N/A [id number]   Procedure: NISSEN FUNDOPLICATION;  Surgeon: Jules Husbands, MD;  Location: [facility] ORS;  Service: General;  Laterality: N/A;    Medications:  Current Outpatient Medications on File Prior to  Visit  Medication Sig  . acetaminophen (TYLENOL) 500 MG tablet Take 500 mg by mouth every 6 (six) hours as needed.  Marland Kitchen albuterol (VENTOLIN HFA) 108 (90 Base) MCG/ACT inhaler INHALE 2 PUFFS BY MOUTH EVERY 6 HOURS AS NEEDED  . cholecalciferol (VITAMIN D) 1000 units tablet Take 1,000 Units by mouth daily.  Marland Kitchen escitalopram (LEXAPRO) 10 MG tablet Take 1 tablet (10 mg total) by mouth daily.  . Fluticasone-Salmeterol (ADVAIR DISKUS) 250-50 MCG/DOSE AEPB Inhale 1 puff into the lungs 2 (two) times daily.  Marland Kitchen levofloxacin (LEVAQUIN) 500 MG tablet Take 1 tablet (500 mg total) by mouth daily.  . Multiple Vitamin (MULTIVITAMIN WITH MINERALS) TABS tablet Take 1 tablet by mouth daily.  . OXYGEN Inhale 2  L into the lungs.   . potassium chloride (K-DUR) 10 MEQ tablet Take 1 tablet (10 mEq total) by mouth daily.  . predniSONE (STERAPRED UNI-PAK 21 TAB) 10 MG (21) TBPK tablet 6 day taper - take by mouth as directed for 6 days  . tiotropium (SPIRIVA HANDIHALER) 18 MCG inhalation capsule Place 1 capsule (18 mcg total) into inhaler and inhale daily.  . traMADol (ULTRAM) 50 MG tablet TK 1 T PO Q 6 H PRN P   No current facility-administered medications on file prior to visit.     Allergies:  Allergies  Allergen Reactions  . Codeine Nausea And Vomiting    Pt states that she can tolerate Tramadol fine.  . Crestor [Rosuvastatin Calcium] Other (See Comments)    myalgias  . Lipitor [Atorvastatin Calcium] Other (See Comments)    Myalgias    Social History:  Social History   Socioeconomic History  . Marital status: Married    Spouse name: Not on file  . Number of children: Not on file  . Years of education: 81  . Highest education level: 12th grade  Occupational History  . Occupation: retired  Scientific laboratory technician  . Financial resource strain: Not hard at all  . Food insecurity    Worry: Never true    Inability: Never true  . Transportation needs    Medical: No    Non-medical: No  Tobacco Use  . Smoking status: Former Smoker    Types: Cigarettes    Quit date: 03/18/2001    Years since quitting: 17.8  . Smokeless tobacco: Never Used  Substance and Sexual Activity  . Alcohol use: Yes    Comment: rare  . Drug use: No  . Sexual activity: Not Currently  Lifestyle  . Physical activity    Days per week: 0 days    Minutes per session: 0 min  . Stress: Not at all  Relationships  . Social connections    Talks on phone: More than three times a week    Gets together: More than three times a week    Attends religious service: More than 4 times per year    Active member of club or organization: No    Attends meetings of clubs or organizations: Never    Relationship status: Married  .  Intimate partner violence    Fear of current or ex partner: No    Emotionally abused: No    Physically abused: No    Forced sexual activity: No  Other Topics Concern  . Not on file  Social History Narrative  . Not on file   Social History   Tobacco Use  Smoking Status Former Smoker  . Types: Cigarettes  . Quit date: 03/18/2001  . Years since quitting:  17.8  Smokeless Tobacco Never Used   Social History   Substance and Sexual Activity  Alcohol Use Yes   Comment: rare    Family History:  Family History  Problem Relation Age of Onset  . Ovarian cancer Mother   . Emphysema Father   . Melanoma Sister   . Lung cancer Brother     Past medical history, surgical history, medications, allergies, family history and social history reviewed with patient today and changes made to appropriate areas of the chart.   Review of Systems - General ROS: negative Psychological ROS: negative Ophthalmic ROS: negative ENT ROS: negative Allergy and Immunology ROS: negative Hematological and Lymphatic ROS: negative Endocrine ROS: negative Breast ROS: negative for breast lumps Respiratory ROS: no cough, shortness of breath, or wheezing Cardiovascular ROS: no chest pain or dyspnea on exertion Gastrointestinal ROS: positive for - constipation Genito-Urinary ROS: no dysuria, trouble voiding, or hematuria Musculoskeletal ROS: positive for - muscle pain Neurological ROS: no TIA or stroke symptoms Dermatological ROS: negative All other ROS negative except what is listed above and in the HPI.      Objective:    BP 130/82   Pulse 94   Temp 98.5 F (36.9 C) (Oral)   Ht 5' 1.7" (1.567 m)   Wt 136 lb 12.8 oz (62.1 kg)   SpO2 94%   BMI 25.26 kg/m   Wt Readings from Last 3 Encounters:  01/23/19 136 lb 12.8 oz (62.1 kg)  01/02/19 134 lb (60.8 kg)  11/21/18 134 lb 6.4 oz (61 kg)    Physical Exam Vitals signs and nursing note reviewed.  Constitutional:      General: She is not in acute  distress.    Appearance: She is well-developed.  HENT:     Head: Atraumatic.     Right Ear: External ear normal.     Left Ear: External ear normal.     Nose: Nose normal.     Mouth/Throat:     Pharynx: No oropharyngeal exudate.  Eyes:     General: No scleral icterus.    Conjunctiva/sclera: Conjunctivae normal.     Pupils: Pupils are equal, round, and reactive to light.  Neck:     Musculoskeletal: Normal range of motion and neck supple.     Thyroid: No thyromegaly.  Cardiovascular:     Rate and Rhythm: Normal rate and regular rhythm.     Heart sounds: Normal heart sounds.  Pulmonary:     Effort: Pulmonary effort is normal. No respiratory distress.     Breath sounds: Normal breath sounds.  Chest:     Breasts:        Right: No mass, skin change or tenderness.        Left: No mass, skin change or tenderness.  Abdominal:     General: Bowel sounds are normal.     Palpations: Abdomen is soft. There is no mass.     Tenderness: There is no abdominal tenderness.  Genitourinary:    Comments: GU exam declined Musculoskeletal: Normal range of motion.        General: No tenderness.  Lymphadenopathy:     Cervical: No cervical adenopathy.     Upper Body:     Right upper body: No axillary adenopathy.     Left upper body: No axillary adenopathy.  Skin:    General: Skin is warm and dry.     Findings: No rash.  Neurological:     Mental Status: She is alert and oriented to  person, place, and time.     Cranial Nerves: No cranial nerve deficit.  Psychiatric:        Behavior: Behavior normal.     Results for orders placed or performed in visit on [id number]  Basic metabolic panel  Result Value Ref Range   Glucose 111 (H) 65 - 99 mg/dL   BUN 12 8 - 27 mg/dL   Creatinine, Ser 0.73 0.57 - 1.00 mg/dL   GFR calc non Af Amer 83 >59 mL/min/1.73   GFR calc Af Amer 96 >59 mL/min/1.73   BUN/Creatinine Ratio 16 12 - 28   Sodium 135 134 - 144 mmol/L   Potassium 4.6 3.5 - 5.2 mmol/L   Chloride  97 96 - 106 mmol/L   CO2 20 20 - 29 mmol/L   Calcium 10.0 8.7 - 10.3 mg/dL      Assessment & Plan:   Problem List Items Addressed This Visit      Cardiovascular and Mediastinum   Essential hypertension    BPs improved on recheck and stable during home monitoring. Continue current regimen      Relevant Medications   pravastatin (PRAVACHOL) 40 MG tablet     Other   Hyperlipidemia - Primary    Myalgias with lipitor and crestor, will try pravastatin and if not tolerable will switch to alternative therapy. Monitor closely for tolerance. Continue lifestyle modifications      Relevant Medications   pravastatin (PRAVACHOL) 40 MG tablet    Other Visit Diagnoses    Annual physical exam       Constipation, unspecified constipation type       Increase dietary fiber and can add supplemental fiber or probiotics. Colace prn, increase water intake   Encounter for screening mammogram for malignant neoplasm of breast       Relevant Orders   MM DIGITAL SCREENING BILATERAL       Follow up plan: Return in about 6 months (around 07/24/2019) for 6 month f/u.   LABORATORY TESTING:  - Pap smear: not applicable  IMMUNIZATIONS:   - Tdap: Tetanus vaccination status reviewed: last tetanus booster within 10 years. - Influenza: postponed due to current illness on prednisone and abx - Pneumovax: Up to date - Prevnar: Up to date - HPV: Not applicable - Zostavax vaccine: Up to date  SCREENING: -Mammogram: Ordered today  - Colonoscopy: Up to date  - Bone Density: Up to date   PATIENT COUNSELING:   Advised to take 1 mg of folate supplement per day if capable of pregnancy.   Sexuality: Discussed sexually transmitted diseases, partner selection, use of condoms, avoidance of unintended pregnancy  and contraceptive alternatives.   Advised to avoid cigarette smoking.  I discussed with the patient that most people either abstain from alcohol or drink within safe limits (<=14/week and <=4  drinks/occasion for males, <=7/weeks and <= 3 drinks/occasion for females) and that the risk for alcohol disorders and other health effects rises proportionally with the number of drinks per week and how often a drinker exceeds daily limits.  Discussed cessation/primary prevention of drug use and availability of treatment for abuse.   Diet: Encouraged to adjust caloric intake to maintain  or achieve ideal body weight, to reduce intake of dietary saturated fat and total fat, to limit sodium intake by avoiding high sodium foods and not adding table salt, and to maintain adequate dietary potassium and calcium preferably from fresh fruits, vegetables, and low-fat dairy products.    stressed the importance of regular exercise  Injury prevention: Discussed safety belts, safety helmets, smoke detector, smoking near bedding or upholstery.   Dental health: Discussed importance of regular tooth brushing, flossing, and dental visits.    NEXT PREVENTATIVE PHYSICAL DUE IN 1 YEAR. Return in about 6 months (around 07/24/2019) for 6 month f/u.

## 2019-01-23 NOTE — Patient Instructions (Addendum)
Today we have removed a Lipoma in our office. Please see information below regarding this type of tumor.  You are free to shower in 48 hours. This will be on 01/25/19.  You have glue on your skin and sutures under the skin. The glue will come off on it's own in 10-14 days. You may shower normally until this occurs but do not submerge.  Please use Tylenol or Ibuprofen for pain as needed. You may use your ice pack to the area several times a day for comfort.   We will see you back in 2 weeks to ensure that this has healed and to review the final pathology. Please see your appointment below. You may continue your regular activities right away but if you are having pain while doing something, stop what you are doing and try this activity once again in 3 days. Please call our office with any questions or concerns prior to your appointment.   Lipoma Removal Lipoma removal is a surgical procedure to remove a noncancerous (benign) tumor that is made up of fat cells (lipoma). Most lipomas are small and painless and do not require treatment. They can form in many areas of the body but are most common under the skin of the back, shoulders, arms, and thighs. You may need lipoma removal if you have a lipoma that is large, growing, or causing discomfort. Lipoma removal may also be done for cosmetic reasons. Tell a health care provider about:  Any allergies you have.  All medicines you are taking, including vitamins, herbs, eye drops, creams, and over-the-counter medicines.  Any problems you or family members have had with anesthetic medicines.  Any blood disorders you have.  Any surgeries you have had.  Any medical conditions you have.  Whether you are pregnant or may be pregnant. What are the risks? Generally, this is a safe procedure. However, problems may occur, including:  Infection.  Bleeding.  Allergic reactions to medicines.  Damage to nerves or blood vessels near the lipoma.   Scarring.  What happens before the procedure? Staying hydrated Follow instructions from your health care provider about hydration, which may include:  Up to 2 hours before the procedure - you may continue to drink clear liquids, such as water, clear fruit juice, black coffee, and plain tea.  Eating and drinking restrictions Follow instructions from your health care provider about eating and drinking, which may include:  8 hours before the procedure - stop eating heavy meals or foods such as meat, fried foods, or fatty foods.  6 hours before the procedure - stop eating light meals or foods, such as toast or cereal.  6 hours before the procedure - stop drinking milk or drinks that contain milk.  2 hours before the procedure - stop drinking clear liquids.  Medicines  Ask your health care provider about: ? Changing or stopping your regular medicines. This is especially important if you are taking diabetes medicines or blood thinners. ? Taking medicines such as aspirin and ibuprofen. These medicines can thin your blood. Do not take these medicines before your procedure if your health care provider instructs you not to.  You may be given antibiotic medicine to help prevent infection. General instructions  Ask your health care provider how your surgical site will be marked or identified.  You will have a physical exam. Your health care provider will check the size of the lipoma and whether it can be moved easily.  You may have imaging tests, such as: ?  X-rays. ? CT scan. ? MRI.  Plan to have someone take you home from the hospital or clinic. What happens during the procedure?  To reduce your risk of infection: ? Your health care team will wash or sanitize their hands. ? Your skin will be washed with soap.  You will be given one or more of the following: ? A medicine to help you relax (sedative). ? A medicine to numb the area (local anesthetic). ? A medicine to make you fall  asleep (general anesthetic). ? A medicine that is injected into an area of your body to numb everything below the injection site (regional anesthetic).  An incision will be made over the lipoma or very near the lipoma. The incision may be made in a natural skin line or crease.  Tissues, nerves, and blood vessels near the lipoma will be moved out of the way.  The lipoma and the capsule that surrounds it will be separated from the surrounding tissues.  The lipoma will be removed.  The incision may be closed with stitches (sutures).  A bandage (dressing) will be placed over the incision. What happens after the procedure?  Do not drive for 24 hours if you received a sedative.  Your blood pressure, heart rate, breathing rate, and blood oxygen level will be monitored until the medicines you were given have worn off. This information is not intended to replace advice given to you by your health care provider. Make sure you discuss any questions you have with your health care provider. Document Released: 06/13/2015 Document Revised: 09/05/2015 Document Reviewed: 06/13/2015 Elsevier Interactive Patient Education  [employer].

## 2019-01-23 NOTE — Assessment & Plan Note (Signed)
BPs improved on recheck and stable during home monitoring. Continue current regimen

## 2019-01-24 ENCOUNTER — Encounter: Payer: Self-pay | Admitting: Family Medicine

## 2019-01-24 LAB — COMPREHENSIVE METABOLIC PANEL
ALT: 15 IU/L (ref 0–32)
AST: 18 IU/L (ref 0–40)
Albumin/Globulin Ratio: 1.7 (ref 1.2–2.2)
Albumin: 4.6 g/dL (ref 3.7–4.7)
Alkaline Phosphatase: 86 IU/L (ref 39–117)
BUN/Creatinine Ratio: 19 (ref 12–28)
BUN: 15 mg/dL (ref 8–27)
Bilirubin Total: <0.2 mg/dL (ref 0.0–1.2)
CO2: 22 mmol/L (ref 20–29)
Calcium: 10 mg/dL (ref 8.7–10.3)
Chloride: 96 mmol/L (ref 96–106)
Creatinine, Ser: 0.8 mg/dL (ref 0.57–1.00)
GFR calc Af Amer: 85 mL/min/{1.73_m2} (ref >59)
GFR calc non Af Amer: 74 mL/min/{1.73_m2} (ref >59)
Globulin, Total: 2.7 g/dL (ref 1.5–4.5)
Glucose: 96 mg/dL (ref 65–99)
Potassium: 4.3 mmol/L (ref 3.5–5.2)
Sodium: 136 mmol/L (ref 134–144)
Total Protein: 7.3 g/dL (ref 6.0–8.5)

## 2019-01-24 LAB — CBC WITH DIFFERENTIAL/PLATELET
Basophils Absolute: 0 10*3/uL (ref 0.0–0.2)
Basos: 0 %
EOS (ABSOLUTE): 0 10*3/uL (ref 0.0–0.4)
Eos: 0 %
Hematocrit: 39.8 % (ref 34.0–46.6)
Hemoglobin: 13 g/dL (ref 11.1–15.9)
Immature Grans (Abs): 0.1 10*3/uL (ref 0.0–0.1)
Immature Granulocytes: 1 %
Lymphocytes Absolute: 0.8 10*3/uL (ref 0.7–3.1)
Lymphs: 7 %
MCH: 31.2 pg (ref 26.6–33.0)
MCHC: 32.7 g/dL (ref 31.5–35.7)
MCV: 95 fL (ref 79–97)
Monocytes Absolute: 0.6 10*3/uL (ref 0.1–0.9)
Monocytes: 5 %
Neutrophils Absolute: 10.6 10*3/uL — ABNORMAL HIGH (ref 1.4–7.0)
Neutrophils: 87 %
Platelets: 399 10*3/uL (ref 150–450)
RBC: 4.17 x10E6/uL (ref 3.77–5.28)
RDW: 11.8 % (ref 11.7–15.4)
WBC: 12.1 10*3/uL — ABNORMAL HIGH (ref 3.4–10.8)

## 2019-01-24 LAB — LIPID PANEL
Chol/HDL Ratio: 2.5 ratio (ref 0.0–4.4)
Cholesterol, Total: 245 mg/dL — ABNORMAL HIGH (ref 100–199)
HDL: 100 mg/dL (ref >39)
LDL Chol Calc (NIH): 130 mg/dL — ABNORMAL HIGH (ref 0–99)
Triglycerides: 88 mg/dL (ref 0–149)
VLDL Cholesterol Cal: 15 mg/dL (ref 5–40)

## 2019-01-24 LAB — TSH: TSH: 0.58 u[IU]/mL (ref 0.450–4.500)

## 2019-01-25 NOTE — Progress Notes (Signed)
Procedure Note  1. Excision of Right shoulder soft tissue mass measuring 42 mm 2.  Intermediate closure of a 42 mm wound right shoulder  Diagnosis: soft tissue mass right shoulder   Anesthesia: lidocaine 1% w wpi 10cc  EBL: minimal   Findings: lipoma  After informed consent was obtained the patient was prepped and draped in the usual sterile fashion.  Lidocaine was infiltrated and incision was created with a 15 blade knife.  Metzenbaum was used to dissect the mass from adjacent structures.  The mass was excised and sent for pathology.  The hemostasis was obtained with pressure.  The wound was closed in a layered fashion with dermal 3-0 Vicryls and 4-0 Monocryl's for the skin in a subcuticular fashion.  Dermabond was applied

## 2019-01-30 DIAGNOSIS — J441 Chronic obstructive pulmonary disease with (acute) exacerbation: Secondary | ICD-10-CM | POA: Insufficient documentation

## 2019-01-30 DIAGNOSIS — Z9981 Dependence on supplemental oxygen: Secondary | ICD-10-CM | POA: Insufficient documentation

## 2019-01-31 DIAGNOSIS — J441 Chronic obstructive pulmonary disease with (acute) exacerbation: Secondary | ICD-10-CM | POA: Diagnosis not present

## 2019-02-02 DIAGNOSIS — J441 Chronic obstructive pulmonary disease with (acute) exacerbation: Secondary | ICD-10-CM | POA: Diagnosis not present

## 2019-02-06 ENCOUNTER — Other Ambulatory Visit: Payer: Self-pay | Admitting: Family Medicine

## 2019-02-06 ENCOUNTER — Telehealth: Payer: Self-pay | Admitting: Family Medicine

## 2019-02-06 MED ORDER — ROSUVASTATIN CALCIUM 5 MG PO TABS: 5.0000 mg | ORAL_TABLET | Freq: Every day | ORAL | 3 refills | Status: DC

## 2019-02-06 NOTE — Telephone Encounter (Signed)
Discussed with patient side effects to multiple statins with myalgias. Patient was able to take Crestor for 4 years before developed myalgias. Discussed will change to 5 mg Crestor after 2-week drug vacation.

## 2019-02-06 NOTE — Telephone Encounter (Signed)
Pt states that the pravastatin (PRAVACHOL) 40 MG tablet isn't working.  States that it is causing her legs and back to hurt.  States that her legs are almost giving out on her and she would like to be put on something different. Pt uses  [id number] - [address], [place] [phone] (Phone) [phone] (Fax)

## 2019-02-09 ENCOUNTER — Encounter: Payer: Self-pay | Admitting: Physician Assistant

## 2019-02-09 ENCOUNTER — Other Ambulatory Visit: Payer: Self-pay

## 2019-02-09 ENCOUNTER — Ambulatory Visit (INDEPENDENT_AMBULATORY_CARE_PROVIDER_SITE_OTHER): Payer: PPO | Admitting: Physician Assistant

## 2019-02-09 VITALS — BP 106/75 | HR 93 | Temp 96.6°F | Ht 62.0 in | Wt 133.8 lb

## 2019-02-09 DIAGNOSIS — Z09 Encounter for follow-up examination after completed treatment for conditions other than malignant neoplasm: Secondary | ICD-10-CM

## 2019-02-09 DIAGNOSIS — D1721 Benign lipomatous neoplasm of skin and subcutaneous tissue of right arm: Secondary | ICD-10-CM

## 2019-02-09 NOTE — Progress Notes (Signed)
[facility] POST-OP OFFICE VISIT  02/09/2019  HPI: Joyce Rodriguez is a 73 y.o. female 10 days s/p excision of right shoulder soft tissue mass with Dr Dahlia Byes.   Today, she reports that she is doing well. She is concerned over an area anterior to the shoulder which has swollen. No fever, chills, redness, or drainage. No injury/truama.   Vital signs: BP 106/75   Pulse 93   Temp (!) 96.6 F (35.9 C) (Temporal)   Ht 5\' 2"  (1.575 m)   Wt 133 lb 12.8 oz (60.7 kg)   SpO2 91%   BMI 24.47 kg/m    Physical Exam: Constitutional: Well appearing female, NAD Skin: Incision over right shoulder is CDI, no erythema. There is a nodule anterior to the incision which likely represents a hematoma.   Assessment/Plan: This is a 73 y.o. female 10 days s/p excision of right shoulder soft tissue mass   - Call in 1 month if not improving  - pain control prn  - Reviewed Pathology: Lipoma  - o/w follow up prn  -- Edison Simon, PA-C [facility] 02/09/2019, 9:31 AM [phone] M-F: 7am - 4pm

## 2019-02-09 NOTE — Patient Instructions (Addendum)
Call us back in one month with an update.  Follow-up with our office as needed.  Please call and ask to speak with a nurse if you develop questions or concerns.

## 2019-03-03 DIAGNOSIS — J441 Chronic obstructive pulmonary disease with (acute) exacerbation: Secondary | ICD-10-CM | POA: Diagnosis not present

## 2019-03-05 ENCOUNTER — Other Ambulatory Visit: Payer: Self-pay | Admitting: Adult Health

## 2019-03-05 DIAGNOSIS — J441 Chronic obstructive pulmonary disease with (acute) exacerbation: Secondary | ICD-10-CM | POA: Diagnosis not present

## 2019-03-05 DIAGNOSIS — J449 Chronic obstructive pulmonary disease, unspecified: Secondary | ICD-10-CM

## 2019-03-20 ENCOUNTER — Telehealth: Payer: Self-pay

## 2019-03-20 ENCOUNTER — Other Ambulatory Visit: Payer: Self-pay | Admitting: Internal Medicine

## 2019-03-20 MED ORDER — NAPROXEN 500 MG PO TABS: 500.0000 mg | ORAL_TABLET | Freq: Two times a day (BID) | ORAL | 0 refills | Status: DC

## 2019-03-20 NOTE — Telephone Encounter (Signed)
Patient would like a prescription for naproxen

## 2019-04-02 DIAGNOSIS — J441 Chronic obstructive pulmonary disease with (acute) exacerbation: Secondary | ICD-10-CM | POA: Diagnosis not present

## 2019-04-04 DIAGNOSIS — J441 Chronic obstructive pulmonary disease with (acute) exacerbation: Secondary | ICD-10-CM | POA: Diagnosis not present

## 2019-04-17 ENCOUNTER — Other Ambulatory Visit: Payer: Self-pay | Admitting: Internal Medicine

## 2019-04-17 DIAGNOSIS — J449 Chronic obstructive pulmonary disease, unspecified: Secondary | ICD-10-CM

## 2019-05-02 ENCOUNTER — Telehealth: Payer: Self-pay | Admitting: Family Medicine

## 2019-05-02 ENCOUNTER — Other Ambulatory Visit: Payer: Self-pay

## 2019-05-02 ENCOUNTER — Ambulatory Visit (INDEPENDENT_AMBULATORY_CARE_PROVIDER_SITE_OTHER): Payer: PPO | Admitting: Family Medicine

## 2019-05-02 ENCOUNTER — Encounter: Payer: Self-pay | Admitting: Family Medicine

## 2019-05-02 VITALS — BP 142/80 | HR 92 | Temp 98.2°F | Ht 62.0 in | Wt 135.0 lb

## 2019-05-02 DIAGNOSIS — R109 Unspecified abdominal pain: Secondary | ICD-10-CM

## 2019-05-02 LAB — WET PREP FOR TRICH, YEAST, CLUE
Clue Cell Exam: NEGATIVE
Trichomonas Exam: NEGATIVE
Yeast Exam: NEGATIVE

## 2019-05-02 MED ORDER — SULFAMETHOXAZOLE-TRIMETHOPRIM 800-160 MG PO TABS: 1.0000 | ORAL_TABLET | Freq: Two times a day (BID) | ORAL | 0 refills | Status: DC

## 2019-05-02 NOTE — Telephone Encounter (Signed)
Called patient to review urine and wet prep results. Some leukocytes present on U/A but otherwise tests WNL. Will tx with bactrim based on sxs and await urine culture. [place], increase fluid intake and pt to call if sxs worsening or not improving

## 2019-05-02 NOTE — Progress Notes (Signed)
BP (!) 142/80   Pulse 92   Temp 98.2 F (36.8 C) (Temporal)   Ht 5\' 2"  (1.575 m)   Wt 135 lb (61.2 kg)   BMI 24.69 kg/m    Subjective:    Patient ID: Joyce Rodriguez, female    DOB: [birth year], 74 y.o.   MRN: [id number]  HPI: Joyce Rodriguez is a 74 y.o. female  Chief Complaint  Patient presents with  . Abdominal Pain    lower abdomen. symptoms started last sunday  . Back Pain    lower  . Urinary Frequency    . This visit was completed via WebEx due to the restrictions of the COVID-19 pandemic. All issues as above were discussed and addressed. Physical exam was done as above through visual confirmation on WebEx. If it was felt that the patient should be evaluated in the office, they were directed there. The patient verbally consented to this visit. . Location of the patient: in parked car . Location of the provider: home . Those involved with this call:  . Provider: Merrie Roof, PA-C . CMA: Lesle Chris, [place] . Front Desk/Registration: Jill Side  . Time spent on call: 15 minutes with patient face to face via video conference. More than 50% of this time was spent in counseling and coordination of care. 5 minutes total spent in review of patient's record and preparation of their chart. I verified patient identity using two factors (patient name and date of birth). Patient consents verbally to being seen via telemedicine visit today.   4 days of lower abdominal pain and low back pain, urinary hesitancy, nausea. Denies fever, vomiting, bower changes, sick contacts, new foods, recent travel. Not trying anything OTC but is trying to drink more fluids. No hx of lower GI conditions, only reflux.   Relevant past medical, surgical, family and social history reviewed and updated as indicated. Interim medical history since our last visit reviewed. Allergies and medications reviewed and updated.  Review of Systems  Per HPI unless specifically indicated above     Objective:    BP  (!) 142/80   Pulse 92   Temp 98.2 F (36.8 C) (Temporal)   Ht 5\' 2"  (1.575 m)   Wt 135 lb (61.2 kg)   BMI 24.69 kg/m   Wt Readings from Last 3 Encounters:  05/02/19 135 lb (61.2 kg)  02/09/19 133 lb 12.8 oz (60.7 kg)  01/23/19 136 lb 9.6 oz (62 kg)    Physical Exam Vitals and nursing note reviewed.  Constitutional:      General: She is not in acute distress.    Appearance: Normal appearance.  HENT:     Head: Atraumatic.     Right Ear: External ear normal.     Left Ear: External ear normal.     Nose: Nose normal. No congestion.     Mouth/Throat:     Mouth: Mucous membranes are moist.     Pharynx: Oropharynx is clear. No posterior oropharyngeal erythema.  Eyes:     Extraocular Movements: Extraocular movements intact.     Conjunctiva/sclera: Conjunctivae normal.  Cardiovascular:     Comments: Unable to assess via virtual visit Pulmonary:     Effort: Pulmonary effort is normal. No respiratory distress.  Abdominal:     Comments: Unable to perform abdominal exam due to virtual nature, but patient's self palpation reveals mild suprapubic ttp  Musculoskeletal:        General: Normal range of motion.     Cervical back:  Normal range of motion.  Skin:    General: Skin is dry.     Findings: No erythema.  Neurological:     Mental Status: She is alert and oriented to person, place, and time.  Psychiatric:        Mood and Affect: Mood normal.        Thought Content: Thought content normal.        Judgment: Judgment normal.    Results for orders placed or performed in visit on 01/23/19  Urinalysis, Routine w reflex microscopic  Result Value Ref Range   Specific Gravity, UA 1.015 1.005 - 1.030   pH, UA 5.5 5.0 - 7.5   Color, UA Yellow Yellow   Appearance Ur Clear Clear   Leukocytes,UA Negative Negative   Protein,UA Negative Negative/Trace   Glucose, UA Negative Negative   Ketones, UA Negative Negative   RBC, UA Negative Negative   Bilirubin, UA Negative Negative    Urobilinogen, Ur 0.2 0.2 - 1.0 mg/dL   Nitrite, UA Negative Negative  TSH  Result Value Ref Range   TSH 0.580 0.450 - 4.500 uIU/mL  CBC with Differential/Platelet  Result Value Ref Range   WBC 12.1 (H) 3.4 - 10.8 x10E3/uL   RBC 4.17 3.77 - 5.28 x10E6/uL   Hemoglobin 13.0 11.1 - 15.9 g/dL   Hematocrit 39.8 34.0 - 46.6 %   MCV 95 79 - 97 fL   MCH 31.2 26.6 - 33.0 pg   MCHC 32.7 31.5 - 35.7 g/dL   RDW 11.8 11.7 - 15.4 %   Platelets 399 150 - 450 x10E3/uL   Neutrophils 87 Not Estab. %   Lymphs 7 Not Estab. %   Monocytes 5 Not Estab. %   Eos 0 Not Estab. %   Basos 0 Not Estab. %   Neutrophils Absolute 10.6 (H) 1.4 - 7.0 x10E3/uL   Lymphocytes Absolute 0.8 0.7 - 3.1 x10E3/uL   Monocytes Absolute 0.6 0.1 - 0.9 x10E3/uL   EOS (ABSOLUTE) 0.0 0.0 - 0.4 x10E3/uL   Basophils Absolute 0.0 0.0 - 0.2 x10E3/uL   Immature Granulocytes 1 Not Estab. %   Immature Grans (Abs) 0.1 0.0 - 0.1 x10E3/uL  Lipid panel  Result Value Ref Range   Cholesterol, Total 245 (H) 100 - 199 mg/dL   Triglycerides 88 0 - 149 mg/dL   HDL 100 >39 mg/dL   VLDL Cholesterol Cal 15 5 - 40 mg/dL   LDL Chol Calc (NIH) 130 (H) 0 - 99 mg/dL   Chol/HDL Ratio 2.5 0.0 - 4.4 ratio  Comprehensive metabolic panel  Result Value Ref Range   Glucose 96 65 - 99 mg/dL   BUN 15 8 - 27 mg/dL   Creatinine, Ser 0.80 0.57 - 1.00 mg/dL   GFR calc non Af Amer 74 >59 mL/min/1.73   GFR calc Af Amer 85 >59 mL/min/1.73   BUN/Creatinine Ratio 19 12 - 28   Sodium 136 134 - 144 mmol/L   Potassium 4.3 3.5 - 5.2 mmol/L   Chloride 96 96 - 106 mmol/L   CO2 22 20 - 29 mmol/L   Calcium 10.0 8.7 - 10.3 mg/dL   Total Protein 7.3 6.0 - 8.5 g/dL   Albumin 4.6 3.7 - 4.7 g/dL   Globulin, Total 2.7 1.5 - 4.5 g/dL   Albumin/Globulin Ratio 1.7 1.2 - 2.2   Bilirubin Total <0.2 0.0 - 1.2 mg/dL   Alkaline Phosphatase 86 39 - 117 IU/L   AST 18 0 - 40 IU/L   ALT  15 0 - 32 IU/L      Assessment & Plan:   Problem List Items Addressed This Visit    None     Visit Diagnoses    Abdominal pain, unspecified abdominal location    -  Primary   Await wet prep and U/A results, discussed good hydration, probiotics. Treat as needed based on results   Relevant Orders   UA/M w/rflx Culture, Routine   WET PREP FOR TRICH, YEAST, CLUE       Follow up plan: Return if symptoms worsen or fail to improve.

## 2019-05-04 LAB — UA/M W/RFLX CULTURE, ROUTINE
Bilirubin, UA: NEGATIVE
Glucose, UA: NEGATIVE
Ketones, UA: NEGATIVE
Nitrite, UA: NEGATIVE
Protein,UA: NEGATIVE
RBC, UA: NEGATIVE
Specific Gravity, UA: 1.015 (ref 1.005–1.030)
Urobilinogen, Ur: 0.2 mg/dL (ref 0.2–1.0)
pH, UA: 5 (ref 5.0–7.5)

## 2019-05-04 LAB — MICROSCOPIC EXAMINATION
Bacteria, UA: NONE SEEN
RBC, Urine: NONE SEEN /hpf (ref 0–2)

## 2019-05-04 LAB — URINE CULTURE, REFLEX

## 2019-05-08 ENCOUNTER — Other Ambulatory Visit: Payer: Self-pay | Admitting: Internal Medicine

## 2019-05-08 DIAGNOSIS — R1032 Left lower quadrant pain: Secondary | ICD-10-CM

## 2019-05-08 DIAGNOSIS — Z1211 Encounter for screening for malignant neoplasm of colon: Secondary | ICD-10-CM | POA: Diagnosis not present

## 2019-05-08 DIAGNOSIS — Z1231 Encounter for screening mammogram for malignant neoplasm of breast: Secondary | ICD-10-CM | POA: Diagnosis not present

## 2019-05-08 DIAGNOSIS — R7309 Other abnormal glucose: Secondary | ICD-10-CM | POA: Diagnosis not present

## 2019-05-08 DIAGNOSIS — J431 Panlobular emphysema: Secondary | ICD-10-CM | POA: Insufficient documentation

## 2019-05-08 DIAGNOSIS — Z79899 Other long term (current) drug therapy: Secondary | ICD-10-CM | POA: Diagnosis not present

## 2019-05-08 DIAGNOSIS — F3341 Major depressive disorder, recurrent, in partial remission: Secondary | ICD-10-CM | POA: Insufficient documentation

## 2019-05-08 DIAGNOSIS — E782 Mixed hyperlipidemia: Secondary | ICD-10-CM | POA: Diagnosis not present

## 2019-05-08 DIAGNOSIS — R1030 Lower abdominal pain, unspecified: Secondary | ICD-10-CM | POA: Diagnosis not present

## 2019-05-08 DIAGNOSIS — I10 Essential (primary) hypertension: Secondary | ICD-10-CM | POA: Diagnosis not present

## 2019-05-11 DIAGNOSIS — R1030 Lower abdominal pain, unspecified: Secondary | ICD-10-CM | POA: Diagnosis not present

## 2019-05-11 DIAGNOSIS — Z79899 Other long term (current) drug therapy: Secondary | ICD-10-CM | POA: Diagnosis not present

## 2019-05-11 DIAGNOSIS — Z1231 Encounter for screening mammogram for malignant neoplasm of breast: Secondary | ICD-10-CM | POA: Diagnosis not present

## 2019-05-11 DIAGNOSIS — F3341 Major depressive disorder, recurrent, in partial remission: Secondary | ICD-10-CM | POA: Diagnosis not present

## 2019-05-11 DIAGNOSIS — E782 Mixed hyperlipidemia: Secondary | ICD-10-CM | POA: Diagnosis not present

## 2019-05-11 DIAGNOSIS — Z1211 Encounter for screening for malignant neoplasm of colon: Secondary | ICD-10-CM | POA: Diagnosis not present

## 2019-05-11 DIAGNOSIS — I10 Essential (primary) hypertension: Secondary | ICD-10-CM | POA: Diagnosis not present

## 2019-05-11 DIAGNOSIS — J431 Panlobular emphysema: Secondary | ICD-10-CM | POA: Diagnosis not present

## 2019-05-11 DIAGNOSIS — R7309 Other abnormal glucose: Secondary | ICD-10-CM | POA: Diagnosis not present

## 2019-05-11 DIAGNOSIS — R1032 Left lower quadrant pain: Secondary | ICD-10-CM | POA: Diagnosis not present

## 2019-05-17 ENCOUNTER — Telehealth: Payer: Self-pay | Admitting: Family Medicine

## 2019-05-17 NOTE — Chronic Care Management (AMB) (Signed)
  Chronic Care Management   Outreach Note  05/17/2019 Name: Joyce Rodriguez MRN: [id number] DOB: 18-Jul-1945  [name] is a 74 y.o. year old female who is a primary care patient of Crissman, Jeannette How, MD. I reached out to Harl Bowie by phone today in response to a referral sent by Ms. Daiva Eves health plan.     An unsuccessful telephone outreach was attempted today. The patient was referred to the case management team by for assistance with care management and care coordination.   Follow Up Plan: A HIPPA compliant phone message was left for the patient providing contact information and requesting a return call.  The care management team will reach out to the patient again over the next 7 days.  If patient returns call to provider office, please advise to call [facility] at [place], [place] [place] [address], [place], [place] [address] Direct Dial: [phone] [email or web address] Website: [email or web address]

## 2019-05-19 ENCOUNTER — Other Ambulatory Visit: Payer: Self-pay

## 2019-05-19 ENCOUNTER — Ambulatory Visit
Admission: RE | Admit: 2019-05-19 | Discharge: 2019-05-19 | Disposition: A | Discharge status: Home / Self Care | Payer: PPO | Source: Ambulatory Visit | Attending: Internal Medicine | Admitting: Internal Medicine

## 2019-05-19 DIAGNOSIS — R109 Unspecified abdominal pain: Secondary | ICD-10-CM | POA: Diagnosis not present

## 2019-05-19 DIAGNOSIS — R1032 Left lower quadrant pain: Secondary | ICD-10-CM | POA: Insufficient documentation

## 2019-05-19 MED ORDER — IOHEXOL 300 MG/ML  SOLN
85.0000 mL | Freq: Once | INTRAMUSCULAR | Status: AC | PRN
  Administered 2019-05-19: 08:00:00 85 mL via INTRAVENOUS

## 2019-05-22 NOTE — Chronic Care Management (AMB) (Signed)
Chronic Care Management  ° °Note ° °05/22/2019 °Name: Joyce Rodriguez MRN: [id number] DOB: 05/18/1945 ° °Joyce Rodriguez is a 73 y.o. year old female who is a primary care patient of Sparks, Jeffrey D, MD. I reached out to Rabab Kroeze by phone today in response to a referral sent by [name]'s health plan.    ° °[name] was given information about Chronic Care Management services today including:  °1. CCM service includes personalized support from designated clinical staff supervised by her physician, including individualized plan of care and coordination with other care providers °2. 24/7 contact phone numbers for assistance for urgent and routine care needs. °3. Service will only be billed when office clinical staff spend 20 minutes or more in a month to coordinate care. °4. Only one practitioner may furnish and bill the service in a calendar month. °5. The patient may stop CCM services at any time (effective at the end of the month) by phone call to the office staff. °6. The patient will be responsible for cost sharing (co-pay) of up to 20% of the service fee (after annual deductible is met). ° °Patient did not agree to enrollment in care management services and does not wish to consider at this time. ° °Follow up plan: °The patient has been provided with contact information for the care management team and has been advised to call with any health related questions or concerns.  ° °Amber Wray, RMA °Care Guide, Embedded Care Coordination °[facility]  °[place], [place] [address] °Direct Dial: [phone] [email or web address] °Website: [email or web address]  °

## 2019-05-23 ENCOUNTER — Telehealth: Payer: Self-pay

## 2019-05-23 NOTE — Telephone Encounter (Signed)
Confirmed appointment on 05/25/2019 and screened for covid. kl[facility] 

## 2019-05-25 ENCOUNTER — Ambulatory Visit: Payer: PPO | Admitting: Internal Medicine

## 2019-05-25 ENCOUNTER — Encounter: Payer: Self-pay | Admitting: Internal Medicine

## 2019-05-25 ENCOUNTER — Other Ambulatory Visit: Payer: Self-pay

## 2019-05-25 VITALS — BP 138/80 | HR 100 | Temp 97.4°F | Resp 16 | Ht 62.0 in | Wt 136.4 lb

## 2019-05-25 DIAGNOSIS — R0602 Shortness of breath: Secondary | ICD-10-CM

## 2019-05-25 DIAGNOSIS — Z9981 Dependence on supplemental oxygen: Secondary | ICD-10-CM

## 2019-05-25 DIAGNOSIS — R079 Chest pain, unspecified: Secondary | ICD-10-CM | POA: Diagnosis not present

## 2019-05-25 DIAGNOSIS — K219 Gastro-esophageal reflux disease without esophagitis: Secondary | ICD-10-CM | POA: Diagnosis not present

## 2019-05-25 NOTE — Progress Notes (Signed)
[facility] [place], [place] [address]  Pulmonary Sleep Medicine   Office Visit Note  Patient Name: Joyce Rodriguez DOB: 1946-03-29 MRN [id number]  Date of Service: 05/25/2019  Complaints/HPI: She has been having some issues with stomach problems possible diverticulitis. She has severe pain in the epigastric area Patient is going to be worked up per GI. She has noted some edema on the legs. She has not had any issues with breathing until recently. No work up for the heart has been done  ROS  General: (-) fever, (-) chills, (-) night sweats, (-) weakness Skin: (-) rashes, (-) itching,. Eyes: (-) visual changes, (-) redness, (-) itching. Nose and Sinuses: (-) nasal stuffiness or itchiness, (-) postnasal drip, (-) nosebleeds, (-) sinus trouble. Mouth and Throat: (-) sore throat, (-) hoarseness. Neck: (-) swollen glands, (-) enlarged thyroid, (-) neck pain. Respiratory: + cough, (-) bloody sputum, + shortness of breath, - wheezing. Cardiovascular: + ankle swelling, (-) chest pain. Lymphatic: (-) lymph node enlargement. Neurologic: (-) numbness, (-) tingling. Psychiatric: (-) anxiety, (-) depression   Current Medication: Outpatient Encounter Medications as of 05/25/2019  Medication Sig Note  . acetaminophen (TYLENOL) 500 MG tablet Take 500 mg by mouth every 6 (six) hours as needed.   Marland Kitchen ADVAIR DISKUS 250-50 MCG/DOSE AEPB INHALE 1 PUFF INTO THE LUNGS TWICE DAILY   . albuterol (VENTOLIN HFA) 108 (90 Base) MCG/ACT inhaler INHALE 2 PUFFS BY MOUTH INTO THE LUNGS EVERY 6 HOURS AS NEEDED   . cholecalciferol (VITAMIN D) 1000 units tablet Take 1,000 Units by mouth daily.   Marland Kitchen escitalopram (LEXAPRO) 10 MG tablet Take 1 tablet (10 mg total) by mouth daily.   . Multiple Vitamin (MULTIVITAMIN WITH MINERALS) TABS tablet Take 1 tablet by mouth daily.   . OXYGEN Inhale 2 L into the lungs.  11/16/2018: 3l at night, 2 or 3/ for portable   . potassium chloride (K-DUR) 10 MEQ tablet  Take 1 tablet (10 mEq total) by mouth daily.   . rosuvastatin (CRESTOR) 5 MG tablet Take 1 tablet (5 mg total) by mouth daily.   Marland Kitchen SPIRIVA HANDIHALER 18 MCG inhalation capsule PLACE 1 CAPSULE INTO INHALER AND INHALE DAILY   . sulfamethoxazole-trimethoprim (BACTRIM DS) 800-160 MG tablet Take 1 tablet by mouth 2 (two) times daily.   . traMADol (ULTRAM) 50 MG tablet TK 1 T PO Q 6 H PRN P 05/02/2019: As needed   No facility-administered encounter medications on file as of 05/25/2019.    Surgical History: Past Surgical History:  Procedure Laterality Date  . ABDOMINAL HYSTERECTOMY    . CHOLECYSTECTOMY    . ESOPHAGEAL MANOMETRY N/A 09/13/2017   Procedure: ESOPHAGEAL MANOMETRY (EM);  Surgeon: Mauri Pole, MD;  Location: [facility];  Service: Endoscopy;  Laterality: N/A;  . ESOPHAGOGASTRODUODENOSCOPY (EGD) WITH PROPOFOL N/A 05/11/2017   Procedure: ESOPHAGOGASTRODUODENOSCOPY (EGD) WITH PROPOFOL;  Surgeon: Lin Landsman, MD;  Location: [facility] ENDOSCOPY;  Service: Gastroenterology;  Laterality: N/A;  . FOOT SURGERY    . LAPAROSCOPIC NISSEN FUNDOPLICATION N/A [id number]   Procedure: LAPAROSCOPIC NISSEN FUNDOPLICATION;  Surgeon: Jules Husbands, MD;  Location: [facility] ORS;  Service: General;  Laterality: N/A;  . NISSEN FUNDOPLICATION N/A [id number]   Procedure: NISSEN FUNDOPLICATION;  Surgeon: Jules Husbands, MD;  Location: [facility] ORS;  Service: General;  Laterality: N/A;    Medical History: Past Medical History:  Diagnosis Date  . Arthritis   . Complication of anesthesia    difficulty waking up  . COPD (chronic  obstructive pulmonary disease) ([place])   . Dyspnea   . Emphysema of lung ([place])   . GERD (gastroesophageal reflux disease)   . Hyperlipidemia   . Hypertension   . Osteoporosis   . Oxygen deficiency    uses O2 at night    Family History: Family History  Problem Relation Age of Onset  . Ovarian cancer Mother   . Emphysema Father   . Melanoma Sister   . Lung cancer Brother      Social History: Social History   Socioeconomic History  . Marital status: Married    Spouse name: Not on file  . Number of children: Not on file  . Years of education: 26  . Highest education level: 12th grade  Occupational History  . Occupation: retired  Tobacco Use  . Smoking status: Former Smoker    Types: Cigarettes    Quit date: 03/18/2001    Years since quitting: 18.1  . Smokeless tobacco: Never Used  Substance and Sexual Activity  . Alcohol use: Yes    Comment: rare  . Drug use: No  . Sexual activity: Not Currently  Other Topics Concern  . Not on file  Social History Narrative  . Not on file   Social Determinants of Health   Financial Resource Strain:   . Difficulty of Paying Living Expenses: Not on file  Food Insecurity:   . Worried About Charity fundraiser in the Last Year: Not on file  . Ran Out of Food in the Last Year: Not on file  Transportation Needs:   . Lack of Transportation (Medical): Not on file  . Lack of Transportation (Non-Medical): Not on file  Physical Activity:   . Days of Exercise per Week: Not on file  . Minutes of Exercise per Session: Not on file  Stress:   . Feeling of Stress : Not on file  Social Connections:   . Frequency of Communication with Friends and Family: Not on file  . Frequency of Social Gatherings with Friends and Family: Not on file  . Attends Religious Services: Not on file  . Active Member of Clubs or Organizations: Not on file  . Attends Archivist Meetings: Not on file  . Marital Status: Not on file  Intimate Partner Violence:   . Fear of Current or Ex-Partner: Not on file  . Emotionally Abused: Not on file  . Physically Abused: Not on file  . Sexually Abused: Not on file    Vital Signs: Blood pressure 138/80, pulse 100, temperature (!) 97.4 F (36.3 C), resp. rate 16, height 5\' 2"  (1.575 m), weight 136 lb 6.4 oz (61.9 kg), SpO2 91 %.  Examination: General Appearance: The patient is  well-developed, well-nourished, and in no distress. Skin: Gross inspection of skin unremarkable. Head: normocephalic, no gross deformities. Eyes: no gross deformities noted. ENT: ears appear grossly normal no exudates. Neck: Supple. No thyromegaly. No LAD. Respiratory: no rhonchi noted at this time. Cardiovascular: Normal S1 and S2 without murmur or rub. Extremities: No cyanosis. pulses are equal. Neurologic: Alert and oriented. No involuntary movements.  LABS: Recent Results (from the past 2160 hour(s))  UA/M w/rflx Culture, Routine     Status: Abnormal   Collection Time: 05/02/19  9:48 AM   Specimen: Urine   URINE  Result Value Ref Range   Specific Gravity, UA 1.015 1.005 - 1.030   pH, UA 5.0 5.0 - 7.5   Color, UA Yellow Yellow   Appearance Ur Clear Clear  Leukocytes,UA 1+ (A) Negative   Protein,UA Negative Negative/Trace   Glucose, UA Negative Negative   Ketones, UA Negative Negative   RBC, UA Negative Negative   Bilirubin, UA Negative Negative   Urobilinogen, Ur 0.2 0.2 - 1.0 mg/dL   Nitrite, UA Negative Negative   Microscopic Examination See below:    Urinalysis Reflex Comment     Comment: This specimen has reflexed to a Urine Culture.  WET PREP FOR [place], YEAST, CLUE     Status: None   Collection Time: 05/02/19  9:48 AM   Specimen: Urine   URINE  Result Value Ref Range   Trichomonas Exam Negative Negative   Yeast Exam Negative Negative   Clue Cell Exam Negative Negative  Microscopic Examination     Status: None   Collection Time: 05/02/19  9:48 AM   URINE  Result Value Ref Range   WBC, UA 0-5 0 - 5 /hpf   RBC None seen 0 - 2 /hpf   Epithelial Cells (non renal) 0-10 0 - 10 /hpf   Bacteria, UA None seen None seen/Few  Urine Culture, Reflex     Status: None   Collection Time: 05/02/19  9:48 AM   URINE  Result Value Ref Range   Urine Culture, Routine Final report    Organism ID, Bacteria Comment     Comment: Culture shows less than 10,000 colony forming units  of bacteria per milliliter of urine. This colony count is not generally considered to be clinically significant.     Radiology: CT ABDOMEN PELVIS W CONTRAST  Result Date: 05/19/2019 CLINICAL DATA:  Acute lower abdominal pain. EXAM: CT ABDOMEN AND PELVIS WITH CONTRAST TECHNIQUE: Multidetector CT imaging of the abdomen and pelvis was performed using the standard protocol following bolus administration of intravenous contrast. CONTRAST:  12mL OMNIPAQUE IOHEXOL 300 MG/ML  SOLN COMPARISON:  May 03, 2012. FINDINGS: Lower chest: No acute abnormality. Hepatobiliary: No focal liver abnormality is seen. Status post cholecystectomy. No biliary dilatation. Pancreas: Unremarkable. No pancreatic ductal dilatation or surrounding inflammatory changes. Spleen: Normal in size without focal abnormality. Adrenals/Urinary Tract: Adrenal glands are unremarkable. Kidneys are normal, without renal calculi, focal lesion, or hydronephrosis. Bladder is unremarkable. Stomach/Bowel: The stomach appears normal. There is no evidence of bowel obstruction or inflammation. The appendix is not visualized. Vascular/Lymphatic: Aortic atherosclerosis. No enlarged abdominal or pelvic lymph nodes. Reproductive: Status post hysterectomy. No adnexal masses. Other: Small fat containing periumbilical hernia is noted. No ascites is noted. Musculoskeletal: No acute or significant osseous findings. IMPRESSION: Small fat containing periumbilical hernia. No other abnormality seen in the abdomen or pelvis. Aortic Atherosclerosis (ICD10-I70.0). Electronically Signed   By: Marijo Conception M.D.   On: 05/19/2019 09:59    No results found.  CT ABDOMEN PELVIS W CONTRAST  Result Date: 05/19/2019 CLINICAL DATA:  Acute lower abdominal pain. EXAM: CT ABDOMEN AND PELVIS WITH CONTRAST TECHNIQUE: Multidetector CT imaging of the abdomen and pelvis was performed using the standard protocol following bolus administration of intravenous contrast. CONTRAST:  84mL  OMNIPAQUE IOHEXOL 300 MG/ML  SOLN COMPARISON:  May 03, 2012. FINDINGS: Lower chest: No acute abnormality. Hepatobiliary: No focal liver abnormality is seen. Status post cholecystectomy. No biliary dilatation. Pancreas: Unremarkable. No pancreatic ductal dilatation or surrounding inflammatory changes. Spleen: Normal in size without focal abnormality. Adrenals/Urinary Tract: Adrenal glands are unremarkable. Kidneys are normal, without renal calculi, focal lesion, or hydronephrosis. Bladder is unremarkable. Stomach/Bowel: The stomach appears normal. There is no evidence of bowel obstruction or inflammation. The  appendix is not visualized. Vascular/Lymphatic: Aortic atherosclerosis. No enlarged abdominal or pelvic lymph nodes. Reproductive: Status post hysterectomy. No adnexal masses. Other: Small fat containing periumbilical hernia is noted. No ascites is noted. Musculoskeletal: No acute or significant osseous findings. IMPRESSION: Small fat containing periumbilical hernia. No other abnormality seen in the abdomen or pelvis. Aortic Atherosclerosis (ICD10-I70.0). Electronically Signed   By: Marijo Conception M.D.   On: 05/19/2019 09:59      Assessment and Plan: Patient Active Problem List   Diagnosis Date Noted  . Obstructive chronic bronchitis with exacerbation ([place]) 01/30/2019  . Oxygen dependent 01/30/2019  . S/P Nissen fundoplication (with gastrostomy tube placement) ([place]) 10/19/2017  . CAD (coronary artery disease), native coronary artery 10/18/2017  . Hiatal hernia 10/15/2017  . Abnormal EKG 10/15/2017  . Dysphagia   . Schatzki's ring   . Gastric erosion   . Advanced care planning/counseling discussion 04/21/2017  . Acute on chronic respiratory failure with hypoxia ([place]) 05/08/2016  . COPD exacerbation ([place]) 05/08/2016  . Hyperglycemia 05/08/2016  . Dyspnea 05/06/2016  . Encounter for hepatitis C screening test for low risk patient 03/19/2016  . Essential hypertension 03/19/2015  . COPD  (chronic obstructive pulmonary disease) ([place]) 03/19/2015  . Hyperlipidemia 03/19/2015  . Osteoarthritis of both hands 03/19/2015  . Osteoporosis 03/19/2015  . Acid reflux 03/19/2015  . Major depression, chronic 09/12/2014    1. Severe COPD T obviously has advanced disease we will continue with current inhaler regimen.  The patient is doing well from a COPD perspective my concern is whether or not she may actually have some cardiac issues.  This is why we are recommending doing a myocardial perfusion imaging study on her. 2. Chest/epigastric pain atypical pain but she could certainly have coronary artery disease myocardial perfusion was ordered in addition to that we will do an echocardiogram.  Once these are done we will have her come back in so that she can be evaluated. 3. Oxygen dependent she will continue with her current medical management as well as the oxygen therapy for her COPD 4. GERD continue with current therapy again this could also be causing the epigastric or chest pain that she has been experiencing because of her age and previous history ruling out cardiac issues it is of course of utmost importance  General Counseling: I have discussed the findings of the evaluation and examination with Elif.  I have also discussed any further diagnostic evaluation thatmay be needed or ordered today. Daaiyah verbalizes understanding of the findings of todays visit. We also reviewed her medications today and discussed drug interactions and side effects including but not limited excessive drowsiness and altered mental states. We also discussed that there is always a risk not just to her but also people around her. she has been encouraged to call the office with any questions or concerns that should arise related to todays visit.  Orders Placed This Encounter  Procedures  . Myocardial Perfusion Imaging    Order Specific Question:   Where should this be performed?    Answer:   [facility]  at [place] Specific Question:   Type of stress    Answer:   Adenosine    Order Specific Question:   Patient weight in lbs    Answer:   136  . ECHOCARDIOGRAM COMPLETE    Standing Status:   Future    Standing Expiration Date:   08/21/2020    Order Specific Question:   Where  should this test be performed    Answer:   External    Order Specific Question:   Perflutren DEFINITY (image enhancing agent) should be administered unless hypersensitivity or allergy exist    Answer:   Administer Perflutren    Order Specific Question:   Reason for exam-Echo    Answer:   Dyspnea  786.09 / R06.00  . Spirometry with Graph    Order Specific Question:   Where should this test be performed?    Answer:   [facility]     Time spent: 9min  I have personally obtained a history, examined the patient, evaluated laboratory and imaging results, formulated the assessment and plan and placed orders.    Allyne Gee, MD [facility] Pulmonary and Critical Care Sleep medicine

## 2019-05-25 NOTE — Patient Instructions (Signed)
Chronic Obstructive Pulmonary Disease Chronic obstructive pulmonary disease (COPD) is a long-term (chronic) lung problem. When you have COPD, it is hard for air to get in and out of your lungs. Usually the condition gets worse over time, and your lungs will never return to normal. There are things you can do to keep yourself as healthy as possible.  Your doctor may treat your condition with: ? Medicines. ? Oxygen. ? Lung surgery.  Your doctor may also recommend: ? Rehabilitation. This includes steps to make your body work better. It may involve a team of specialists. ? Quitting smoking, if you smoke. ? Exercise and changes to your diet. ? Comfort measures (palliative care). Follow these instructions at home: Medicines  Take over-the-counter and prescription medicines only as told by your doctor.  Talk to your doctor before taking any cough or allergy medicines. You may need to avoid medicines that cause your lungs to be dry. Lifestyle  If you smoke, stop. Smoking makes the problem worse. If you need help quitting, ask your doctor.  Avoid being around things that make your breathing worse. This may include smoke, chemicals, and fumes.  Stay active, but remember to rest as well.  Learn and use tips on how to relax.  Make sure you get enough sleep. Most adults need at least 7 hours of sleep every night.  Eat healthy foods. Eat smaller meals more often. Rest before meals. Controlled breathing Learn and use tips on how to control your breathing as told by your doctor. Try:  Breathing in (inhaling) through your nose for 1 second. Then, pucker your lips and breath out (exhale) through your lips for 2 seconds.  Putting one hand on your belly (abdomen). Breathe in slowly through your nose for 1 second. Your hand on your belly should move out. Pucker your lips and breathe out slowly through your lips. Your hand on your belly should move in as you breathe out.  Controlled coughing Learn  and use controlled coughing to clear mucus from your lungs. Follow these steps: 1. Lean your head a little forward. 2. Breathe in deeply. 3. Try to hold your breath for 3 seconds. 4. Keep your mouth slightly open while coughing 2 times. 5. Spit any mucus out into a tissue. 6. Rest and do the steps again 1 or 2 times as needed. General instructions  Make sure you get all the shots (vaccines) that your doctor recommends. Ask your doctor about a flu shot and a pneumonia shot.  Use oxygen therapy and pulmonary rehabilitation if told by your doctor. If you need home oxygen therapy, ask your doctor if you should buy a tool to measure your oxygen level (oximeter).  Make a COPD action plan with your doctor. This helps you to know what to do if you feel worse than usual.  Manage any other conditions you have as told by your doctor.  Avoid going outside when it is very hot, cold, or humid.  Avoid people who have a sickness you can catch (contagious).  Keep all follow-up visits as told by your doctor. This is important. Contact a doctor if:  You cough up more mucus than usual.  There is a change in the color or thickness of the mucus.  It is harder to breathe than usual.  Your breathing is faster than usual.  You have trouble sleeping.  You need to use your medicines more often than usual.  You have trouble doing your normal activities such as getting dressed   or walking around the house. Get help right away if:  You have shortness of breath while resting.  You have shortness of breath that stops you from: ? Being able to talk. ? Doing normal activities.  Your chest hurts for longer than 5 minutes.  Your skin color is more blue than usual.  Your pulse oximeter shows that you have low oxygen for longer than 5 minutes.  You have a fever.  You feel too tired to breathe normally. Summary  Chronic obstructive pulmonary disease (COPD) is a long-term lung problem.  The way your  lungs work will never return to normal. Usually the condition gets worse over time. There are things you can do to keep yourself as healthy as possible.  Take over-the-counter and prescription medicines only as told by your doctor.  If you smoke, stop. Smoking makes the problem worse. This information is not intended to replace advice given to you by your health care provider. Make sure you discuss any questions you have with your health care provider. Document Revised: 03/12/2017 Document Reviewed: 05/04/2016 Elsevier Patient Education  [place]. Chest Wall Pain Chest wall pain is pain in or around the bones and muscles of your chest. Chest wall pain may be caused by:  An injury.  Coughing a lot.  Using your chest and arm muscles too much. Sometimes, the cause may not be known. This pain may take a few weeks or longer to get better. Follow these instructions at home: Managing pain, stiffness, and swelling If told, put ice on the painful area:  Put ice in a plastic bag.  Place a towel between your skin and the bag.  Leave the ice on for 20 minutes, 2-3 times a day.  Activity  Rest as told by your doctor.  Avoid doing things that cause pain. This includes lifting heavy items.  Ask your doctor what activities are safe for you. General instructions   Take over-the-counter and prescription medicines only as told by your doctor.  Do not use any products that contain nicotine or tobacco, such as cigarettes, e-cigarettes, and chewing tobacco. If you need help quitting, ask your doctor.  Keep all follow-up visits as told by your doctor. This is important. Contact a doctor if:  You have a fever.  Your chest pain gets worse.  You have new symptoms. Get help right away if:  You feel sick to your stomach (nauseous) or you throw up (vomit).  You feel sweaty or light-headed.  You have a cough with mucus from your lungs (sputum) or you cough up blood.  You are  short of breath. These symptoms may be an emergency. Do not wait to see if the symptoms will go away. Get medical help right away. Call your local emergency services (911 in the [place] Do not drive yourself to the hospital. Summary  Chest wall pain is pain in or around the bones and muscles of your chest.  It may be treated with ice, rest, and medicines. Your condition may also get better if you avoid doing things that cause pain.  Contact a doctor if you have a fever, chest pain that gets worse, or new symptoms.  Get help right away if you feel light-headed or you get short of breath. These symptoms may be an emergency. This information is not intended to replace advice given to you by your health care provider. Make sure you discuss any questions you have with your health care provider. Document Revised: 09/30/2017 Document  Reviewed: 09/30/2017 Elsevier Patient Education  [employer].

## 2019-05-28 ENCOUNTER — Other Ambulatory Visit: Payer: Self-pay

## 2019-05-28 ENCOUNTER — Ambulatory Visit: Payer: PPO

## 2019-05-30 ENCOUNTER — Ambulatory Visit: Payer: Self-pay | Admitting: Family Medicine

## 2019-05-31 DIAGNOSIS — K420 Umbilical hernia with obstruction, without gangrene: Secondary | ICD-10-CM | POA: Diagnosis not present

## 2019-05-31 DIAGNOSIS — R109 Unspecified abdominal pain: Secondary | ICD-10-CM | POA: Diagnosis not present

## 2019-06-05 ENCOUNTER — Other Ambulatory Visit: Payer: Self-pay

## 2019-06-05 DIAGNOSIS — J449 Chronic obstructive pulmonary disease, unspecified: Secondary | ICD-10-CM

## 2019-06-05 MED ORDER — FLUTICASONE-SALMETEROL 250-50 MCG/DOSE IN AEPB: INHALATION_SPRAY | RESPIRATORY_TRACT | 3 refills | Status: DC

## 2019-06-06 ENCOUNTER — Other Ambulatory Visit: Payer: Self-pay

## 2019-06-06 ENCOUNTER — Telehealth: Payer: Self-pay

## 2019-06-06 DIAGNOSIS — J449 Chronic obstructive pulmonary disease, unspecified: Secondary | ICD-10-CM

## 2019-06-06 MED ORDER — FLUTICASONE-SALMETEROL 250-50 MCG/DOSE IN AEPB: INHALATION_SPRAY | RESPIRATORY_TRACT | 3 refills | Status: DC

## 2019-06-06 NOTE — Telephone Encounter (Signed)
DISABILITY PARKING PLACARD PAPERWORK READY AND PATIENT INFORMED AT [place].

## 2019-06-08 ENCOUNTER — Telehealth: Payer: Self-pay

## 2019-06-08 DIAGNOSIS — Z1231 Encounter for screening mammogram for malignant neoplasm of breast: Secondary | ICD-10-CM | POA: Diagnosis not present

## 2019-06-08 NOTE — Telephone Encounter (Signed)
Confirmed and screened for Ultrasound 06/09/19

## 2019-06-09 ENCOUNTER — Other Ambulatory Visit: Payer: Self-pay

## 2019-06-09 ENCOUNTER — Ambulatory Visit: Payer: PPO

## 2019-06-09 DIAGNOSIS — R079 Chest pain, unspecified: Secondary | ICD-10-CM

## 2019-06-09 DIAGNOSIS — R0602 Shortness of breath: Secondary | ICD-10-CM

## 2019-06-20 ENCOUNTER — Telehealth: Payer: Self-pay

## 2019-06-20 NOTE — Telephone Encounter (Signed)
Confirmed appointment on 06/22/2019 and screened for covid. [facility] 

## 2019-06-22 ENCOUNTER — Encounter: Payer: Self-pay | Admitting: Internal Medicine

## 2019-06-22 ENCOUNTER — Other Ambulatory Visit: Payer: Self-pay

## 2019-06-22 ENCOUNTER — Ambulatory Visit: Payer: PPO | Admitting: Internal Medicine

## 2019-06-22 VITALS — BP 154/88 | HR 96 | Resp 16 | Ht 62.0 in | Wt 136.0 lb

## 2019-06-22 DIAGNOSIS — Z9981 Dependence on supplemental oxygen: Secondary | ICD-10-CM

## 2019-06-22 DIAGNOSIS — I2510 Atherosclerotic heart disease of native coronary artery without angina pectoris: Secondary | ICD-10-CM | POA: Diagnosis not present

## 2019-06-22 DIAGNOSIS — I2583 Coronary atherosclerosis due to lipid rich plaque: Secondary | ICD-10-CM | POA: Diagnosis not present

## 2019-06-22 DIAGNOSIS — J449 Chronic obstructive pulmonary disease, unspecified: Secondary | ICD-10-CM | POA: Diagnosis not present

## 2019-06-22 NOTE — Progress Notes (Signed)
[facility] [place], [place] [address]  Pulmonary Sleep Medicine   Office Visit Note  Patient Name: Joyce Rodriguez DOB: 21-Apr-1945 MRN [id number]  Date of Service: 06/22/2019  Complaints/HPI: Patient is here today for routine pulmonary follow-up. We also discussed the results of her echocardiogram, mild tricuspid regurgitation found, will monitor. Recommended for stress test, will need to have this set-up. Her COPD is managed well on Advair and Spiriva. She also requires oxygen therapy at all times during the day and at night. Currently wearing 3LPM via [place].  ROS  General: (-) fever, (-) chills, (-) night sweats, (-) weakness Skin: (-) rashes, (-) itching,. Eyes: (-) visual changes, (-) redness, (-) itching. Nose and Sinuses: (-) nasal stuffiness or itchiness, (-) postnasal drip, (-) nosebleeds, (-) sinus trouble. Mouth and Throat: (-) sore throat, (-) hoarseness. Neck: (-) swollen glands, (-) enlarged thyroid, (-) neck pain. Respiratory: - cough, (-) bloody sputum, - shortness of breath, - wheezing. Cardiovascular: - ankle swelling, (-) chest pain. Lymphatic: (-) lymph node enlargement. Neurologic: (-) numbness, (-) tingling. Psychiatric: (-) anxiety, (-) depression   Current Medication: Outpatient Encounter Medications as of 06/22/2019  Medication Sig Note  . acetaminophen (TYLENOL) 500 MG tablet Take 500 mg by mouth every 6 (six) hours as needed.   Marland Kitchen albuterol (VENTOLIN HFA) 108 (90 Base) MCG/ACT inhaler INHALE 2 PUFFS BY MOUTH INTO THE LUNGS EVERY 6 HOURS AS NEEDED   . cholecalciferol (VITAMIN D) 1000 units tablet Take 1,000 Units by mouth daily.   Marland Kitchen escitalopram (LEXAPRO) 10 MG tablet Take 1 tablet (10 mg total) by mouth daily.   . Fluticasone-Salmeterol (ADVAIR DISKUS) 250-50 MCG/DOSE AEPB INHALE 1 PUFF INTO THE LUNGS TWICE DAILY   . Multiple Vitamin (MULTIVITAMIN WITH MINERALS) TABS tablet Take 1 tablet by mouth daily.   . OXYGEN Inhale 2 L into  the lungs.  11/16/2018: 3l at night, 2 or 3/ for portable   . potassium chloride (K-DUR) 10 MEQ tablet Take 1 tablet (10 mEq total) by mouth daily.   . rosuvastatin (CRESTOR) 5 MG tablet Take 1 tablet (5 mg total) by mouth daily.   Marland Kitchen SPIRIVA HANDIHALER 18 MCG inhalation capsule PLACE 1 CAPSULE INTO INHALER AND INHALE DAILY   . sulfamethoxazole-trimethoprim (BACTRIM DS) 800-160 MG tablet Take 1 tablet by mouth 2 (two) times daily.   . traMADol (ULTRAM) 50 MG tablet TK 1 T PO Q 6 H PRN P 05/02/2019: As needed   No facility-administered encounter medications on file as of 06/22/2019.    Surgical History: Past Surgical History:  Procedure Laterality Date  . ABDOMINAL HYSTERECTOMY    . CHOLECYSTECTOMY    . ESOPHAGEAL MANOMETRY N/A 09/13/2017   Procedure: ESOPHAGEAL MANOMETRY (EM);  Surgeon: Mauri Pole, MD;  Location: [facility] ENDOSCOPY;  Service: Endoscopy;  Laterality: N/A;  . ESOPHAGOGASTRODUODENOSCOPY (EGD) WITH PROPOFOL N/A 05/11/2017   Procedure: ESOPHAGOGASTRODUODENOSCOPY (EGD) WITH PROPOFOL;  Surgeon: Lin Landsman, MD;  Location: [facility] ENDOSCOPY;  Service: Gastroenterology;  Laterality: N/A;  . FOOT SURGERY    . LAPAROSCOPIC NISSEN FUNDOPLICATION N/A [id number]   Procedure: LAPAROSCOPIC NISSEN FUNDOPLICATION;  Surgeon: Jules Husbands, MD;  Location: [facility] ORS;  Service: General;  Laterality: N/A;  . NISSEN FUNDOPLICATION N/A [id number]   Procedure: NISSEN FUNDOPLICATION;  Surgeon: Jules Husbands, MD;  Location: [facility] ORS;  Service: General;  Laterality: N/A;    Medical History: Past Medical History:  Diagnosis Date  . Arthritis   . Complication of anesthesia    difficulty  waking up  . COPD (chronic obstructive pulmonary disease) ([place])   . Dyspnea   . Emphysema of lung ([place])   . GERD (gastroesophageal reflux disease)   . Hyperlipidemia   . Hypertension   . Osteoporosis   . Oxygen deficiency    uses O2 at night    Family History: Family History  Problem Relation Age of Onset   . Ovarian cancer Mother   . Emphysema Father   . Melanoma Sister   . Lung cancer Brother     Social History: Social History   Socioeconomic History  . Marital status: Married    Spouse name: Not on file  . Number of children: Not on file  . Years of education: 24  . Highest education level: 12th grade  Occupational History  . Occupation: retired  Tobacco Use  . Smoking status: Former Smoker    Types: Cigarettes    Quit date: 03/18/2001    Years since quitting: 18.2  . Smokeless tobacco: Never Used  Substance and Sexual Activity  . Alcohol use: Yes    Comment: rare  . Drug use: No  . Sexual activity: Not Currently  Other Topics Concern  . Not on file  Social History Narrative  . Not on file   Social Determinants of Health   Financial Resource Strain:   . Difficulty of Paying Living Expenses:   Food Insecurity:   . Worried About Charity fundraiser in the Last Year:   . Arboriculturist in the Last Year:   Transportation Needs:   . Film/video editor (Medical):   Marland Kitchen Lack of Transportation (Non-Medical):   Physical Activity:   . Days of Exercise per Week:   . Minutes of Exercise per Session:   Stress:   . Feeling of Stress :   Social Connections:   . Frequency of Communication with Friends and Family:   . Frequency of Social Gatherings with Friends and Family:   . Attends Religious Services:   . Active Member of Clubs or Organizations:   . Attends Archivist Meetings:   Marland Kitchen Marital Status:   Intimate Partner Violence:   . Fear of Current or Ex-Partner:   . Emotionally Abused:   Marland Kitchen Physically Abused:   . Sexually Abused:     Vital Signs: Blood pressure (!) 154/88, pulse 96, resp. rate 16, height 5\' 2"  (1.575 m), weight 136 lb (61.7 kg), SpO2 93 %.  Examination: General Appearance: The patient is well-developed, well-nourished, and in no distress. Skin: Gross inspection of skin unremarkable. Head: normocephalic, no gross deformities. Eyes: no  gross deformities noted. ENT: ears appear grossly normal no exudates. Neck: Supple. No thyromegaly. No LAD. Respiratory: Clear lung sounds bilaterally. Cardiovascular: Normal S1 and S2 without murmur or rub. Extremities: No cyanosis. pulses are equal. Neurologic: Alert and oriented. No involuntary movements.  LABS: Recent Results (from the past 2160 hour(s))  UA/M w/rflx Culture, Routine     Status: Abnormal   Collection Time: 05/02/19  9:48 AM   Specimen: Urine   URINE  Result Value Ref Range   Specific Gravity, UA 1.015 1.005 - 1.030   pH, UA 5.0 5.0 - 7.5   Color, UA Yellow Yellow   Appearance Ur Clear Clear   Leukocytes,UA 1+ (A) Negative   Protein,UA Negative Negative/Trace   Glucose, UA Negative Negative   Ketones, UA Negative Negative   RBC, UA Negative Negative   Bilirubin, UA Negative Negative   Urobilinogen, Ur 0.2  0.2 - 1.0 mg/dL   Nitrite, UA Negative Negative   Microscopic Examination See below:    Urinalysis Reflex Comment     Comment: This specimen has reflexed to a Urine Culture.  WET PREP FOR [place], YEAST, CLUE     Status: None   Collection Time: 05/02/19  9:48 AM   Specimen: Urine   URINE  Result Value Ref Range   Trichomonas Exam Negative Negative   Yeast Exam Negative Negative   Clue Cell Exam Negative Negative  Microscopic Examination     Status: None   Collection Time: 05/02/19  9:48 AM   URINE  Result Value Ref Range   WBC, UA 0-5 0 - 5 /hpf   RBC None seen 0 - 2 /hpf   Epithelial Cells (non renal) 0-10 0 - 10 /hpf   Bacteria, UA None seen None seen/Few  Urine Culture, Reflex     Status: None   Collection Time: 05/02/19  9:48 AM   URINE  Result Value Ref Range   Urine Culture, Routine Final report    Organism ID, Bacteria Comment     Comment: Culture shows less than 10,000 colony forming units of bacteria per milliliter of urine. This colony count is not generally considered to be clinically significant.     Radiology: CT ABDOMEN  PELVIS W CONTRAST  Result Date: 05/19/2019 CLINICAL DATA:  Acute lower abdominal pain. EXAM: CT ABDOMEN AND PELVIS WITH CONTRAST TECHNIQUE: Multidetector CT imaging of the abdomen and pelvis was performed using the standard protocol following bolus administration of intravenous contrast. CONTRAST:  4mL OMNIPAQUE IOHEXOL 300 MG/ML  SOLN COMPARISON:  May 03, 2012. FINDINGS: Lower chest: No acute abnormality. Hepatobiliary: No focal liver abnormality is seen. Status post cholecystectomy. No biliary dilatation. Pancreas: Unremarkable. No pancreatic ductal dilatation or surrounding inflammatory changes. Spleen: Normal in size without focal abnormality. Adrenals/Urinary Tract: Adrenal glands are unremarkable. Kidneys are normal, without renal calculi, focal lesion, or hydronephrosis. Bladder is unremarkable. Stomach/Bowel: The stomach appears normal. There is no evidence of bowel obstruction or inflammation. The appendix is not visualized. Vascular/Lymphatic: Aortic atherosclerosis. No enlarged abdominal or pelvic lymph nodes. Reproductive: Status post hysterectomy. No adnexal masses. Other: Small fat containing periumbilical hernia is noted. No ascites is noted. Musculoskeletal: No acute or significant osseous findings. IMPRESSION: Small fat containing periumbilical hernia. No other abnormality seen in the abdomen or pelvis. Aortic Atherosclerosis (ICD10-I70.0). Electronically Signed   By: Marijo Conception M.D.   On: 05/19/2019 09:59    No results found.  No results found.    Assessment and Plan: Patient Active Problem List   Diagnosis Date Noted  . Obstructive chronic bronchitis with exacerbation ([place]) 01/30/2019  . Oxygen dependent 01/30/2019  . S/P Nissen fundoplication (with gastrostomy tube placement) ([place]) 10/19/2017  . CAD (coronary artery disease), native coronary artery 10/18/2017  . Hiatal hernia 10/15/2017  . Abnormal EKG 10/15/2017  . Dysphagia   . Schatzki's ring   . Gastric erosion    . Advanced care planning/counseling discussion 04/21/2017  . Acute on chronic respiratory failure with hypoxia ([place]) 05/08/2016  . COPD exacerbation ([place]) 05/08/2016  . Hyperglycemia 05/08/2016  . Dyspnea 05/06/2016  . Encounter for hepatitis C screening test for low risk patient 03/19/2016  . Essential hypertension 03/19/2015  . COPD (chronic obstructive pulmonary disease) ([place]) 03/19/2015  . Hyperlipidemia 03/19/2015  . Osteoarthritis of both hands 03/19/2015  . Osteoporosis 03/19/2015  . Acid reflux 03/19/2015  . Major depression, chronic 09/12/2014    1.  Chronic obstructive pulmonary disease, unspecified COPD type ([place]) Stable on current therapy, continue to monitor.  2. Oxygen dependent Currently stable on 3LPM via [place] during day and night due to hypoxia, continue to monitor,  3. Coronary artery disease due to lipid rich plaque Stable at current time, continue with current therapy and continue to monitor.  General Counseling: I have discussed the findings of the evaluation and examination with Joyce Rodriguez.  I have also discussed any further diagnostic evaluation thatmay be needed or ordered today. Evianna verbalizes understanding of the findings of todays visit. We also reviewed her medications today and discussed drug interactions and side effects including but not limited excessive drowsiness and altered mental states. We also discussed that there is always a risk not just to her but also people around her. she has been encouraged to call the office with any questions or concerns that should arise related to todays visit.  No orders of the defined types were placed in this encounter.    Time spent: 30 minutes including 10 minutes of chart review This patient was seen by Orson Gear AGNP-C in Collaboration with Dr Lavera Guise as a part of collaborative care agreement  I have personally obtained a history, examined the patient, evaluated laboratory and imaging results, formulated the  assessment and plan and placed orders.    Allyne Gee, MD [facility] Pulmonary and Critical Care Sleep medicine

## 2019-07-03 ENCOUNTER — Encounter
Admission: RE | Admit: 2019-07-03 | Discharge: 2019-07-03 | Disposition: A | Discharge status: Home / Self Care | Payer: PPO | Source: Ambulatory Visit | Attending: Internal Medicine | Admitting: Internal Medicine

## 2019-07-03 ENCOUNTER — Other Ambulatory Visit: Payer: Self-pay

## 2019-07-03 DIAGNOSIS — R079 Chest pain, unspecified: Secondary | ICD-10-CM | POA: Insufficient documentation

## 2019-07-03 DIAGNOSIS — I200 Unstable angina: Secondary | ICD-10-CM | POA: Diagnosis not present

## 2019-07-03 LAB — NM MYOCAR MULTI W/SPECT W/WALL MOTION / EF
Estimated workload: 1 METS
Exercise duration (min): 1 min
Exercise duration (sec): 4 s
LV dias vol: 48 mL (ref 46–106)
LV sys vol: 17 mL
Peak HR: 114 {beats}/min
Percent HR: 77 %
Rest HR: 79 {beats}/min
SDS: 1
SRS: 0
SSS: 0
TID: 0.89

## 2019-07-03 MED ORDER — REGADENOSON 0.4 MG/5ML IV SOLN
0.4000 mg | Freq: Once | INTRAVENOUS | Status: AC
  Administered 2019-07-03: 0.4 mg via INTRAVENOUS

## 2019-07-03 MED ORDER — TECHNETIUM TC 99M TETROFOSMIN IV KIT
10.2900 | PACK | Freq: Once | INTRAVENOUS | Status: AC | PRN
  Administered 2019-07-03: 10.29 via INTRAVENOUS

## 2019-07-03 MED ORDER — TECHNETIUM TC 99M TETROFOSMIN IV KIT
30.0000 | PACK | Freq: Once | INTRAVENOUS | Status: AC | PRN
  Administered 2019-07-03: 33.343 via INTRAVENOUS

## 2019-07-12 ENCOUNTER — Telehealth: Payer: Self-pay

## 2019-07-12 NOTE — Telephone Encounter (Signed)
Confirmed appointment on 07/17/2019 and screened for covid. klh 

## 2019-07-17 ENCOUNTER — Ambulatory Visit: Payer: PPO | Admitting: Internal Medicine

## 2019-07-17 ENCOUNTER — Other Ambulatory Visit: Payer: Self-pay

## 2019-07-17 ENCOUNTER — Encounter: Payer: Self-pay | Admitting: Internal Medicine

## 2019-07-17 VITALS — BP 122/81 | HR 93 | Temp 97.4°F | Resp 16 | Ht 62.0 in | Wt 136.0 lb

## 2019-07-17 DIAGNOSIS — I2510 Atherosclerotic heart disease of native coronary artery without angina pectoris: Secondary | ICD-10-CM | POA: Diagnosis not present

## 2019-07-17 DIAGNOSIS — J9611 Chronic respiratory failure with hypoxia: Secondary | ICD-10-CM | POA: Diagnosis not present

## 2019-07-17 DIAGNOSIS — K219 Gastro-esophageal reflux disease without esophagitis: Secondary | ICD-10-CM | POA: Diagnosis not present

## 2019-07-17 DIAGNOSIS — J449 Chronic obstructive pulmonary disease, unspecified: Secondary | ICD-10-CM

## 2019-07-17 NOTE — Progress Notes (Signed)
[facility] [place], [place] [address]  Pulmonary Sleep Medicine   Office Visit Note  Patient Name: Joyce Rodriguez DOB: 09-Sep-1945 MRN [id number]  Date of Service: 07/17/2019  Complaints/HPI: COPD SOB patient is here for follow-up of COPD and shortness of breath.  She states that she has been doing well since the last visit.  She states that she has had no admissions to the hospital.  Continues to use oxygen as prescribed.  She states currently she is on between 2 and 3 L of O2.  She has been doing well.  No exacerbations of her COPD.  She is not on any steroids and she is using her medications as prescribed.  Denies having any chest pain.  As far as her stress test is concerned this was done did not appear to have any acute ST-T wave changes on stress test she had a Lexiscan medical stress test done.  Right now no swelling on the legs and otherwise at baseline as already stated.  ROS  General: (-) fever, (-) chills, (-) night sweats, (-) weakness Skin: (-) rashes, (-) itching,. Eyes: (-) visual changes, (-) redness, (-) itching. Nose and Sinuses: (-) nasal stuffiness or itchiness, (-) postnasal drip, (-) nosebleeds, (-) sinus trouble. Mouth and Throat: (-) sore throat, (-) hoarseness. Neck: (-) swollen glands, (-) enlarged thyroid, (-) neck pain. Respiratory: - cough, (-) bloody sputum, + shortness of breath, + wheezing. Cardiovascular: - ankle swelling, (-) chest pain. Lymphatic: (-) lymph node enlargement. Neurologic: (-) numbness, (-) tingling. Psychiatric: (-) anxiety, (-) depression   Current Medication: Outpatient Encounter Medications as of 07/17/2019  Medication Sig Note  . acetaminophen (TYLENOL) 500 MG tablet Take 500 mg by mouth every 6 (six) hours as needed.   Marland Kitchen albuterol (VENTOLIN HFA) 108 (90 Base) MCG/ACT inhaler INHALE 2 PUFFS BY MOUTH INTO THE LUNGS EVERY 6 HOURS AS NEEDED   . cholecalciferol (VITAMIN D) 1000 units tablet Take 1,000 Units by  mouth daily.   Marland Kitchen escitalopram (LEXAPRO) 10 MG tablet Take 1 tablet (10 mg total) by mouth daily.   . Fluticasone-Salmeterol (ADVAIR DISKUS) 250-50 MCG/DOSE AEPB INHALE 1 PUFF INTO THE LUNGS TWICE DAILY   . Multiple Vitamin (MULTIVITAMIN WITH MINERALS) TABS tablet Take 1 tablet by mouth daily.   . OXYGEN Inhale 2 L into the lungs.  11/16/2018: 3l at night, 2 or 3/ for portable   . potassium chloride (K-DUR) 10 MEQ tablet Take 1 tablet (10 mEq total) by mouth daily.   . rosuvastatin (CRESTOR) 5 MG tablet Take 1 tablet (5 mg total) by mouth daily.   Marland Kitchen SPIRIVA HANDIHALER 18 MCG inhalation capsule PLACE 1 CAPSULE INTO INHALER AND INHALE DAILY   . sulfamethoxazole-trimethoprim (BACTRIM DS) 800-160 MG tablet Take 1 tablet by mouth 2 (two) times daily.   . traMADol (ULTRAM) 50 MG tablet TK 1 T PO Q 6 H PRN P 05/02/2019: As needed   No facility-administered encounter medications on file as of 07/17/2019.    Surgical History: Past Surgical History:  Procedure Laterality Date  . ABDOMINAL HYSTERECTOMY    . CHOLECYSTECTOMY    . ESOPHAGEAL MANOMETRY N/A 09/13/2017   Procedure: ESOPHAGEAL MANOMETRY (EM);  Surgeon: Mauri Pole, MD;  Location: [facility] ENDOSCOPY;  Service: Endoscopy;  Laterality: N/A;  . ESOPHAGOGASTRODUODENOSCOPY (EGD) WITH PROPOFOL N/A 05/11/2017   Procedure: ESOPHAGOGASTRODUODENOSCOPY (EGD) WITH PROPOFOL;  Surgeon: Lin Landsman, MD;  Location: [facility] ENDOSCOPY;  Service: Gastroenterology;  Laterality: N/A;  . FOOT SURGERY    .  LAPAROSCOPIC NISSEN FUNDOPLICATION N/A [id number]   Procedure: LAPAROSCOPIC NISSEN FUNDOPLICATION;  Surgeon: Jules Husbands, MD;  Location: [facility] ORS;  Service: General;  Laterality: N/A;  . NISSEN FUNDOPLICATION N/A [id number]   Procedure: NISSEN FUNDOPLICATION;  Surgeon: Jules Husbands, MD;  Location: [facility] ORS;  Service: General;  Laterality: N/A;    Medical History: Past Medical History:  Diagnosis Date  . Arthritis   . Complication of anesthesia     difficulty waking up  . COPD (chronic obstructive pulmonary disease) ([place])   . Dyspnea   . Emphysema of lung ([place])   . GERD (gastroesophageal reflux disease)   . Hyperlipidemia   . Hypertension   . Osteoporosis   . Oxygen deficiency    uses O2 at night    Family History: Family History  Problem Relation Age of Onset  . Ovarian cancer Mother   . Emphysema Father   . Melanoma Sister   . Lung cancer Brother     Social History: Social History   Socioeconomic History  . Marital status: Married    Spouse name: Not on file  . Number of children: Not on file  . Years of education: 30  . Highest education level: 12th grade  Occupational History  . Occupation: retired  Tobacco Use  . Smoking status: Former Smoker    Types: Cigarettes    Quit date: 03/18/2001    Years since quitting: 18.3  . Smokeless tobacco: Never Used  Substance and Sexual Activity  . Alcohol use: Yes    Comment: rare  . Drug use: No  . Sexual activity: Not Currently  Other Topics Concern  . Not on file  Social History Narrative  . Not on file   Social Determinants of Health   Financial Resource Strain:   . Difficulty of Paying Living Expenses:   Food Insecurity:   . Worried About Charity fundraiser in the Last Year:   . Arboriculturist in the Last Year:   Transportation Needs:   . Film/video editor (Medical):   Marland Kitchen Lack of Transportation (Non-Medical):   Physical Activity:   . Days of Exercise per Week:   . Minutes of Exercise per Session:   Stress:   . Feeling of Stress :   Social Connections:   . Frequency of Communication with Friends and Family:   . Frequency of Social Gatherings with Friends and Family:   . Attends Religious Services:   . Active Member of Clubs or Organizations:   . Attends Archivist Meetings:   Marland Kitchen Marital Status:   Intimate Partner Violence:   . Fear of Current or Ex-Partner:   . Emotionally Abused:   Marland Kitchen Physically Abused:   . Sexually Abused:      Vital Signs: Blood pressure 122/81, pulse 93, temperature (!) 97.4 F (36.3 C), resp. rate 16, height 5\' 2"  (1.575 m), weight 136 lb (61.7 kg), SpO2 99 %.  Examination: General Appearance: The patient is well-developed, well-nourished, and in no distress. Skin: Gross inspection of skin unremarkable. Head: normocephalic, no gross deformities. Eyes: no gross deformities noted. ENT: ears appear grossly normal no exudates. Neck: Supple. No thyromegaly. No LAD. Respiratory: distant few rhonchi noted. Cardiovascular: Normal S1 and S2 without murmur or rub. Extremities: No cyanosis. pulses are equal. Neurologic: Alert and oriented. No involuntary movements.  LABS: Recent Results (from the past 2160 hour(s))  UA/M w/rflx Culture, Routine     Status: Abnormal   Collection Time: 05/02/19  9:48 AM   Specimen: Urine   URINE  Result Value Ref Range   Specific Gravity, UA 1.015 1.005 - 1.030   pH, UA 5.0 5.0 - 7.5   Color, UA Yellow Yellow   Appearance Ur Clear Clear   Leukocytes,UA 1+ (A) Negative   Protein,UA Negative Negative/Trace   Glucose, UA Negative Negative   Ketones, UA Negative Negative   RBC, UA Negative Negative   Bilirubin, UA Negative Negative   Urobilinogen, Ur 0.2 0.2 - 1.0 mg/dL   Nitrite, UA Negative Negative   Microscopic Examination See below:    Urinalysis Reflex Comment     Comment: This specimen has reflexed to a Urine Culture.  WET PREP FOR [place], YEAST, CLUE     Status: None   Collection Time: 05/02/19  9:48 AM   Specimen: Urine   URINE  Result Value Ref Range   Trichomonas Exam Negative Negative   Yeast Exam Negative Negative   Clue Cell Exam Negative Negative  Microscopic Examination     Status: None   Collection Time: 05/02/19  9:48 AM   URINE  Result Value Ref Range   WBC, UA 0-5 0 - 5 /hpf   RBC None seen 0 - 2 /hpf   Epithelial Cells (non renal) 0-10 0 - 10 /hpf   Bacteria, UA None seen None seen/Few  Urine Culture, Reflex     Status: None    Collection Time: 05/02/19  9:48 AM   URINE  Result Value Ref Range   Urine Culture, Routine Final report    Organism ID, Bacteria Comment     Comment: Culture shows less than 10,000 colony forming units of bacteria per milliliter of urine. This colony count is not generally considered to be clinically significant.   NM Myocar Multi W/Spect W/Wall Motion / EF     Status: None   Collection Time: 07/03/19 11:00 AM  Result Value Ref Range   Rest HR 79 bpm   Rest BP 128/78 mmHg   Exercise duration (sec) 4 sec   Percent HR 77 %   Exercise duration (min) 1 min   Estimated workload 1.0 METS   Peak HR 114 bpm   Peak BP 142/74 mmHg   SSS 0    SRS 0    SDS 1    TID 0.89    LV sys vol 17 mL   LV dias vol 48 46 - 106 mL    Radiology: NM Myocar Multi W/Spect W/Wall Motion / EF  Result Date: 07/03/2019  Blood pressure demonstrated a normal response to exercise.  There was no ST segment deviation noted during stress.  The study is normal.  This is a low risk study.  The left ventricular ejection fraction is normal (55-65%).     No results found.  NM Myocar Multi W/Spect W/Wall Motion / EF  Result Date: 07/03/2019  Blood pressure demonstrated a normal response to exercise.  There was no ST segment deviation noted during stress.  The study is normal.  This is a low risk study.  The left ventricular ejection fraction is normal (55-65%).       Assessment and Plan: Patient Active Problem List   Diagnosis Date Noted  . Obstructive chronic bronchitis with exacerbation ([place]) 01/30/2019  . Oxygen dependent 01/30/2019  . S/P Nissen fundoplication (with gastrostomy tube placement) ([place]) 10/19/2017  . CAD (coronary artery disease), native coronary artery 10/18/2017  . Hiatal hernia 10/15/2017  . Abnormal EKG 10/15/2017  . Dysphagia   .  Schatzki's ring   . Gastric erosion   . Advanced care planning/counseling discussion 04/21/2017  . Acute on chronic respiratory failure with  hypoxia ([place]) 05/08/2016  . COPD exacerbation ([place]) 05/08/2016  . Hyperglycemia 05/08/2016  . Dyspnea 05/06/2016  . Encounter for hepatitis C screening test for low risk patient 03/19/2016  . Essential hypertension 03/19/2015  . COPD (chronic obstructive pulmonary disease) ([place]) 03/19/2015  . Hyperlipidemia 03/19/2015  . Osteoarthritis of both hands 03/19/2015  . Osteoporosis 03/19/2015  . Acid reflux 03/19/2015  . Major depression, chronic 09/12/2014    1. COPD severe she has been oxygen dependent and will continue with oxygen.  We will continue to use inhalers as prescribed.  She has not had any admissions to the hospital.  I did speak to her about getting the COVID-19 virus vaccine and she is still hesitant to do so.  Explained to her the risks and benefits.  She understands that she is at high risk for complications from [id number] if she were to develop the disease but she is not ready to do the vaccination. 2. CAD had stress test done this was normal as noted above.  No evidence of acute ischemia we will continue to monitor closely.  She has no active chest pain at this time. 3. GERD has been under control status post surgery fundoplication she has had no further cough symptoms from the reflux 4. Oxygen Dependent she is using 2-3 liters normally seems to be helping her well  General Counseling: I have discussed the findings of the evaluation and examination with Dorian Pod.  I have also discussed any further diagnostic evaluation thatmay be needed or ordered today. Ariyah verbalizes understanding of the findings of todays visit. We also reviewed her medications today and discussed drug interactions and side effects including but not limited excessive drowsiness and altered mental states. We also discussed that there is always a risk not just to her but also people around her. she has been encouraged to call the office with any questions or concerns that should arise related to todays visit.  No  orders of the defined types were placed in this encounter.    Time spent: 48min  I have personally obtained a history, examined the patient, evaluated laboratory and imaging results, formulated the assessment and plan and placed orders.    Allyne Gee, MD [facility] Pulmonary and Critical Care Sleep medicine

## 2019-07-17 NOTE — Patient Instructions (Signed)
Chronic Obstructive Pulmonary Disease Chronic obstructive pulmonary disease (COPD) is a long-term (chronic) lung problem. When you have COPD, it is hard for air to get in and out of your lungs. Usually the condition gets worse over time, and your lungs will never return to normal. There are things you can do to keep yourself as healthy as possible.  Your doctor may treat your condition with: ? Medicines. ? Oxygen. ? Lung surgery.  Your doctor may also recommend: ? Rehabilitation. This includes steps to make your body work better. It may involve a team of specialists. ? Quitting smoking, if you smoke. ? Exercise and changes to your diet. ? Comfort measures (palliative care). Follow these instructions at home: Medicines  Take over-the-counter and prescription medicines only as told by your doctor.  Talk to your doctor before taking any cough or allergy medicines. You may need to avoid medicines that cause your lungs to be dry. Lifestyle  If you smoke, stop. Smoking makes the problem worse. If you need help quitting, ask your doctor.  Avoid being around things that make your breathing worse. This may include smoke, chemicals, and fumes.  Stay active, but remember to rest as well.  Learn and use tips on how to relax.  Make sure you get enough sleep. Most adults need at least 7 hours of sleep every night.  Eat healthy foods. Eat smaller meals more often. Rest before meals. Controlled breathing Learn and use tips on how to control your breathing as told by your doctor. Try:  Breathing in (inhaling) through your nose for 1 second. Then, pucker your lips and breath out (exhale) through your lips for 2 seconds.  Putting one hand on your belly (abdomen). Breathe in slowly through your nose for 1 second. Your hand on your belly should move out. Pucker your lips and breathe out slowly through your lips. Your hand on your belly should move in as you breathe out.  Controlled coughing Learn  and use controlled coughing to clear mucus from your lungs. Follow these steps: 1. Lean your head a little forward. 2. Breathe in deeply. 3. Try to hold your breath for 3 seconds. 4. Keep your mouth slightly open while coughing 2 times. 5. Spit any mucus out into a tissue. 6. Rest and do the steps again 1 or 2 times as needed. General instructions  Make sure you get all the shots (vaccines) that your doctor recommends. Ask your doctor about a flu shot and a pneumonia shot.  Use oxygen therapy and pulmonary rehabilitation if told by your doctor. If you need home oxygen therapy, ask your doctor if you should buy a tool to measure your oxygen level (oximeter).  Make a COPD action plan with your doctor. This helps you to know what to do if you feel worse than usual.  Manage any other conditions you have as told by your doctor.  Avoid going outside when it is very hot, cold, or humid.  Avoid people who have a sickness you can catch (contagious).  Keep all follow-up visits as told by your doctor. This is important. Contact a doctor if:  You cough up more mucus than usual.  There is a change in the color or thickness of the mucus.  It is harder to breathe than usual.  Your breathing is faster than usual.  You have trouble sleeping.  You need to use your medicines more often than usual.  You have trouble doing your normal activities such as getting dressed   or walking around the house. Get help right away if:  You have shortness of breath while resting.  You have shortness of breath that stops you from: ? Being able to talk. ? Doing normal activities.  Your chest hurts for longer than 5 minutes.  Your skin color is more blue than usual.  Your pulse oximeter shows that you have low oxygen for longer than 5 minutes.  You have a fever.  You feel too tired to breathe normally. Summary  Chronic obstructive pulmonary disease (COPD) is a long-term lung problem.  The way your  lungs work will never return to normal. Usually the condition gets worse over time. There are things you can do to keep yourself as healthy as possible.  Take over-the-counter and prescription medicines only as told by your doctor.  If you smoke, stop. Smoking makes the problem worse. This information is not intended to replace advice given to you by your health care provider. Make sure you discuss any questions you have with your health care provider. Document Revised: 03/12/2017 Document Reviewed: 05/04/2016 Elsevier Patient Education  2020 Elsevier Inc.  

## 2019-07-24 ENCOUNTER — Ambulatory Visit: Payer: Self-pay | Admitting: Family Medicine

## 2019-07-26 ENCOUNTER — Telehealth: Payer: Self-pay

## 2019-07-26 NOTE — Telephone Encounter (Signed)
Confirmation of verbal order for home medical equipment signed and placed in  °[facility] folder. °

## 2019-07-27 DIAGNOSIS — Z20828 Contact with and (suspected) exposure to other viral communicable diseases: Secondary | ICD-10-CM | POA: Diagnosis not present

## 2019-07-27 DIAGNOSIS — Z03818 Encounter for observation for suspected exposure to other biological agents ruled out: Secondary | ICD-10-CM | POA: Diagnosis not present

## 2019-07-27 DIAGNOSIS — Z20822 Contact with and (suspected) exposure to covid-19: Secondary | ICD-10-CM | POA: Diagnosis not present

## 2019-08-01 DIAGNOSIS — I10 Essential (primary) hypertension: Secondary | ICD-10-CM | POA: Diagnosis not present

## 2019-08-01 DIAGNOSIS — Z0000 Encounter for general adult medical examination without abnormal findings: Secondary | ICD-10-CM | POA: Diagnosis not present

## 2019-08-01 DIAGNOSIS — Z79899 Other long term (current) drug therapy: Secondary | ICD-10-CM | POA: Diagnosis not present

## 2019-08-01 DIAGNOSIS — E782 Mixed hyperlipidemia: Secondary | ICD-10-CM | POA: Diagnosis not present

## 2019-08-01 DIAGNOSIS — F3341 Major depressive disorder, recurrent, in partial remission: Secondary | ICD-10-CM | POA: Diagnosis not present

## 2019-08-01 DIAGNOSIS — J431 Panlobular emphysema: Secondary | ICD-10-CM | POA: Diagnosis not present

## 2019-08-01 DIAGNOSIS — R7309 Other abnormal glucose: Secondary | ICD-10-CM | POA: Diagnosis not present

## 2019-10-26 DIAGNOSIS — I10 Essential (primary) hypertension: Secondary | ICD-10-CM | POA: Diagnosis not present

## 2019-10-26 DIAGNOSIS — Z79899 Other long term (current) drug therapy: Secondary | ICD-10-CM | POA: Diagnosis not present

## 2019-10-26 DIAGNOSIS — E782 Mixed hyperlipidemia: Secondary | ICD-10-CM | POA: Diagnosis not present

## 2019-10-26 DIAGNOSIS — R7309 Other abnormal glucose: Secondary | ICD-10-CM | POA: Diagnosis not present

## 2019-11-01 ENCOUNTER — Other Ambulatory Visit: Payer: Self-pay | Admitting: Adult Health

## 2019-11-02 DIAGNOSIS — M25511 Pain in right shoulder: Secondary | ICD-10-CM | POA: Diagnosis not present

## 2019-11-02 DIAGNOSIS — F3341 Major depressive disorder, recurrent, in partial remission: Secondary | ICD-10-CM | POA: Diagnosis not present

## 2019-11-02 DIAGNOSIS — T466X5A Adverse effect of antihyperlipidemic and antiarteriosclerotic drugs, initial encounter: Secondary | ICD-10-CM | POA: Diagnosis not present

## 2019-11-02 DIAGNOSIS — G8929 Other chronic pain: Secondary | ICD-10-CM | POA: Diagnosis not present

## 2019-11-02 DIAGNOSIS — J431 Panlobular emphysema: Secondary | ICD-10-CM | POA: Diagnosis not present

## 2019-11-02 DIAGNOSIS — M25512 Pain in left shoulder: Secondary | ICD-10-CM | POA: Diagnosis not present

## 2019-11-02 DIAGNOSIS — E782 Mixed hyperlipidemia: Secondary | ICD-10-CM | POA: Diagnosis not present

## 2019-11-02 DIAGNOSIS — G720 Drug-induced myopathy: Secondary | ICD-10-CM | POA: Insufficient documentation

## 2019-11-02 DIAGNOSIS — I10 Essential (primary) hypertension: Secondary | ICD-10-CM | POA: Diagnosis not present

## 2019-11-16 ENCOUNTER — Telehealth: Payer: Self-pay

## 2019-11-16 NOTE — Telephone Encounter (Signed)
Confirmed and screened for office visit 8/9 

## 2019-11-20 ENCOUNTER — Other Ambulatory Visit: Payer: Self-pay

## 2019-11-20 ENCOUNTER — Encounter: Payer: Self-pay | Admitting: Internal Medicine

## 2019-11-20 ENCOUNTER — Ambulatory Visit: Payer: PPO | Admitting: Internal Medicine

## 2019-11-20 VITALS — BP 140/79 | HR 98 | Temp 97.0°F | Resp 16 | Ht 62.0 in | Wt 138.4 lb

## 2019-11-20 DIAGNOSIS — Z9981 Dependence on supplemental oxygen: Secondary | ICD-10-CM

## 2019-11-20 DIAGNOSIS — J9611 Chronic respiratory failure with hypoxia: Secondary | ICD-10-CM

## 2019-11-20 DIAGNOSIS — R0602 Shortness of breath: Secondary | ICD-10-CM | POA: Diagnosis not present

## 2019-11-20 DIAGNOSIS — R05 Cough: Secondary | ICD-10-CM

## 2019-11-20 DIAGNOSIS — J449 Chronic obstructive pulmonary disease, unspecified: Secondary | ICD-10-CM

## 2019-11-20 DIAGNOSIS — K219 Gastro-esophageal reflux disease without esophagitis: Secondary | ICD-10-CM | POA: Diagnosis not present

## 2019-11-20 DIAGNOSIS — R059 Cough, unspecified: Secondary | ICD-10-CM

## 2019-11-20 MED ORDER — PREDNISONE 10 MG PO TABS: ORAL_TABLET | ORAL | 0 refills | Status: DC

## 2019-11-20 MED ORDER — AZITHROMYCIN 250 MG PO TABS: ORAL_TABLET | ORAL | 0 refills | Status: DC

## 2019-11-20 NOTE — Progress Notes (Signed)
[facility] [place], [place] [address]  Pulmonary Sleep Medicine   Office Visit Note  Patient Name: Joyce Rodriguez DOB: 09-26-1945 MRN [id number]  Date of Service: 11/20/2019  Complaints/HPI: Pt is here for PULM follow up. She reports a significant cough that has been present for about a month.  She [place] it seems to be worse. She denies fever.  The cough is minimally productive at this time.   ROS  General: (-) fever, (-) chills, (-) night sweats, (-) weakness Skin: (-) rashes, (-) itching,. Eyes: (-) visual changes, (-) redness, (-) itching. Nose and Sinuses: (-) nasal stuffiness or itchiness, (-) postnasal drip, (-) nosebleeds, (-) sinus trouble. Mouth and Throat: (-) sore throat, (-) hoarseness. Neck: (-) swollen glands, (-) enlarged thyroid, (-) neck pain. Respiratory: + cough, (-) bloody sputum, + shortness of breath, - wheezing. Cardiovascular: - ankle swelling, (-) chest pain. Lymphatic: (-) lymph node enlargement. Neurologic: (-) numbness, (-) tingling. Psychiatric: (-) anxiety, (-) depression   Current Medication: Outpatient Encounter Medications as of 11/20/2019  Medication Sig Note  . acetaminophen (TYLENOL) 500 MG tablet Take 500 mg by mouth every 6 (six) hours as needed.   Marland Kitchen albuterol (VENTOLIN HFA) 108 (90 Base) MCG/ACT inhaler INHALE 2 PUFFS BY MOUTH EVERY 6 HOURS AS NEEDED   . cholecalciferol (VITAMIN D) 1000 units tablet Take 1,000 Units by mouth daily.   Marland Kitchen escitalopram (LEXAPRO) 10 MG tablet Take 1 tablet (10 mg total) by mouth daily.   Marland Kitchen ezetimibe (ZETIA) 10 MG tablet Take 10 mg by mouth daily.   . Fluticasone-Salmeterol (ADVAIR DISKUS) 250-50 MCG/DOSE AEPB INHALE 1 PUFF INTO THE LUNGS TWICE DAILY   . Multiple Vitamin (MULTIVITAMIN WITH MINERALS) TABS tablet Take 1 tablet by mouth daily.   . OXYGEN Inhale 2 L into the lungs.  11/16/2018: 3l at night, 2 or 3/ for portable   . potassium chloride  (K-DUR) 10 MEQ tablet Take 1 tablet (10 mEq total) by mouth daily.   Marland Kitchen SPIRIVA HANDIHALER 18 MCG inhalation capsule PLACE 1 CAPSULE INTO INHALER AND INHALE DAILY   . traMADol (ULTRAM) 50 MG tablet TK 1 T PO Q 6 H PRN P 05/02/2019: As needed  . [DISCONTINUED] rosuvastatin (CRESTOR) 5 MG tablet Take 1 tablet (5 mg total) by mouth daily.   . [DISCONTINUED] sulfamethoxazole-trimethoprim (BACTRIM DS) 800-160 MG tablet Take 1 tablet by mouth 2 (two) times daily.    No facility-administered encounter medications on file as of 11/20/2019.    Surgical History: Past Surgical History:  Procedure Laterality Date  . ABDOMINAL HYSTERECTOMY    . CHOLECYSTECTOMY    . ESOPHAGEAL MANOMETRY N/A 09/13/2017   Procedure: ESOPHAGEAL MANOMETRY (EM);  Surgeon: Mauri Pole, MD;  Location: [facility] END[facility];  Service: Endoscopy;  Laterality: N/A;  . ESOPHAGOGASTRODUODENOSCOPY (EGD) WITH PROPOFOL N/A 05/11/2017   Procedure: ESOPHAGOGASTRODUODENOSCOPY (EGD) WITH PROPOFOL;  Surgeon: Lin Landsman, MD;  [facility];  Service: Gastroenterology;  Laterality: N/A;  . FOOT SURGERY    . LAPAROSCOPIC NISSEN FUNDOPLICATION N/A 09/18/6718   Procedure: LAPAROSCOPIC NISSEN FUNDOPLICATION;  Surgeon: Jules Husbands, MD;  Location: [facility] ORS;  Service: General;  Laterality: N/A;  . NISSEN FUNDOPLICATION N/A 12/16/7094   Procedure: NISSEN FUNDOPLICATION;  Surgeon: Jules Husbands, MD;  Location: [facility] ORS;  Service: General;  Laterality: N/A;    Medical History: Past Medical History:  Diagnosis Date  . Arthritis   . Complication of anesthesia  difficulty waking up  . COPD (chronic obstructive pulmonary disease) ([place])   . Dyspnea   . Emphysema of lung ([place])   . GERD (gastroesophageal reflux disease)   . Hyperlipidemia   . Hypertension   . Osteoporosis   . Oxygen deficiency    uses O2 at night    Family History: Family History  Problem Relation Age of Onset  . Ovarian cancer Mother   . Emphysema Father    . Melanoma Sister   . Lung cancer Brother     Social History: Social History   Socioeconomic History  . Marital status: Married    Spouse name: Not on file  . Number of children: Not on file  . Years of education: 68  . Highest education level: 12th grade  Occupational History  . Occupation: retired  Tobacco Use  . Smoking status: Former Smoker    Types: Cigarettes    Quit date: 03/18/2001    Years since quitting: 18.6  . Smokeless tobacco: Never Used  Vaping Use  . Vaping Use: Never used  Substance and Sexual Activity  . Alcohol use: Yes    Comment: rare  . Drug use: No  . Sexual activity: Not Currently  Other Topics Concern  . Not on file  Social History Narrative  . Not on file   Social Determinants of Health   Financial Resource Strain:   . Difficulty of Paying Living Expenses:   Food Insecurity:   . Worried About Charity fundraiser in the Last Year:   . Arboriculturist in the Last Year:   Transportation Needs:   . Film/video editor (Medical):   Marland Kitchen Lack of Transportation (Non-Medical):   Physical Activity:   . Days of Exercise per Week:   . Minutes of Exercise per Session:   Stress:   . Feeling of Stress :   Social Connections:   . Frequency of Communication with Friends and Family:   . Frequency of Social Gatherings with Friends and Family:   . Attends Religious Services:   . Active Member of Clubs or Organizations:   . Attends Archivist Meetings:   Marland Kitchen Marital Status:   Intimate Partner Violence:   . Fear of Current or Ex-Partner:   . Emotionally Abused:   Marland Kitchen Physically Abused:   . Sexually Abused:     Vital Signs: Blood pressure 140/79, pulse 98, temperature (!) 97 F (36.1 C), resp. rate 16, height 5\' 2"  (1.575 m), weight 138 lb 6.4 oz (62.8 kg), SpO2 94 %.  Examination: General Appearance: The patient is well-developed, well-nourished, and in no distress. Skin: Gross inspection of skin unremarkable. Head: normocephalic, no  gross deformities. Eyes: no gross deformities noted. ENT: ears appear grossly normal no exudates. Neck: Supple. No thyromegaly. No LAD. Respiratory: clear, slightly diminished in bases. Cardiovascular: Normal S1 and S2 without murmur or rub. Extremities: No cyanosis. pulses are equal. Neurologic: Alert and oriented. No involuntary movements.  LABS: No results found for this or any previous visit (from the past 2160 hour(s)).  Radiology: NM Myocar Multi W/Spect W/Wall Motion / EF  Result Date: 07/03/2019  Blood pressure demonstrated a normal response to exercise.  There was no ST segment deviation noted during stress.  The study is normal.  This is a low risk study.  The left ventricular ejection fraction is normal (55-65%).     No results found.  No results found.    Assessment and Plan: Patient Active Problem List  Diagnosis Date Noted  . Obstructive chronic bronchitis with exacerbation ([place]) 01/30/2019  . Oxygen dependent 01/30/2019  . S/P Nissen fundoplication (with gastrostomy tube placement) ([place]) 10/19/2017  . CAD (coronary artery disease), native coronary artery 10/18/2017  . Hiatal hernia 10/15/2017  . Abnormal EKG 10/15/2017  . Dysphagia   . Schatzki's ring   . Gastric erosion   . Advanced care planning/counseling discussion 04/21/2017  . Acute on chronic respiratory failure with hypoxia ([place]) 05/08/2016  . COPD exacerbation ([place]) 05/08/2016  . Hyperglycemia 05/08/2016  . Dyspnea 05/06/2016  . Encounter for hepatitis C screening test for low risk patient 03/19/2016  . Essential hypertension 03/19/2015  . COPD (chronic obstructive pulmonary disease) ([place]) 03/19/2015  . Hyperlipidemia 03/19/2015  . Osteoarthritis of both hands 03/19/2015  . Osteoporosis 03/19/2015  . Acid reflux 03/19/2015  . Major depression, chronic 09/12/2014    1. Chronic respiratory failure with hypoxia (HCC) Pt continues to use oxygen and inhalers as directed.   2. Oxygen  dependent Continue with 2-3 LPM continually.   3. SOB (shortness of breath) - Spirometry with Graph - azithromycin (ZITHROMAX) 250 MG tablet; Take as directed  Dispense: 6 tablet; Refill: 0 - predniSONE (DELTASONE) 10 MG tablet; Use per dose pack  Dispense: 21 tablet; Refill: 0  4. Cough Use Zpak and prednisone as directed.  - azithromycin (ZITHROMAX) 250 MG tablet; Take as directed  Dispense: 6 tablet; Refill: 0 - predniSONE (DELTASONE) 10 MG tablet; Use per dose pack  Dispense: 21 tablet; Refill: 0  5. Gastroesophageal reflux disease without esophagitis No recent symptoms, continue current management.   6. Chronic obstructive pulmonary disease, unspecified COPD type ([place]) Stable, continue current management.   General Counseling: I have discussed the findings of the evaluation and examination with Dorian Pod.  I have also discussed any further diagnostic evaluation thatmay be needed or ordered today. Quyen verbalizes understanding of the findings of todays visit. We also reviewed her medications today and discussed drug interactions and side effects including but not limited excessive drowsiness and altered mental states. We also discussed that there is always a risk not just to her but also people around her. she has been encouraged to call the office with any questions or concerns that should arise related to todays visit.  Orders Placed This Encounter  Procedures  . Spirometry with Graph    Order Specific Question:   Where should this test be performed?    Answer:   [place]     Time spent: 25 This patient was seen by Orson Gear AGNP-C in Collaboration with Dr. Devona Konig as a part of collaborative care agreement.   I have personally obtained a history, examined the patient, evaluated laboratory and imaging results, formulated the assessment and plan and placed orders.    Allyne Gee, MD [facility] Pulmonary and Critical Care Sleep medicine

## 2019-12-04 DIAGNOSIS — M7582 Other shoulder lesions, left shoulder: Secondary | ICD-10-CM | POA: Insufficient documentation

## 2019-12-04 DIAGNOSIS — M19012 Primary osteoarthritis, left shoulder: Secondary | ICD-10-CM | POA: Diagnosis not present

## 2019-12-04 DIAGNOSIS — M25512 Pain in left shoulder: Secondary | ICD-10-CM | POA: Diagnosis not present

## 2019-12-04 DIAGNOSIS — M25511 Pain in right shoulder: Secondary | ICD-10-CM | POA: Diagnosis not present

## 2019-12-04 DIAGNOSIS — M7581 Other shoulder lesions, right shoulder: Secondary | ICD-10-CM | POA: Diagnosis not present

## 2019-12-04 DIAGNOSIS — M19011 Primary osteoarthritis, right shoulder: Secondary | ICD-10-CM | POA: Insufficient documentation

## 2019-12-04 IMAGING — CT CT CHEST WITHOUT CONTRAST
2 of 4 series · 15 of 36 positions shown, 18 images · non-contrast
Comparison: Chest radiograph, 04/21/2018, CT chest, 05/03/2012

CLINICAL DATA: Shortness of breath, former smoker

EXAM:
CT CHEST WITHOUT CONTRAST
TECHNIQUE: Multidetector CT imaging of the chest was performed following the
standard protocol without IV contrast.

[Series 2: axial chest · axial · 0.60mm/px · z∈[-1195,-919]mm · 12 of 164 slices shown, 15 images]
[im 13/164  mediastinal]
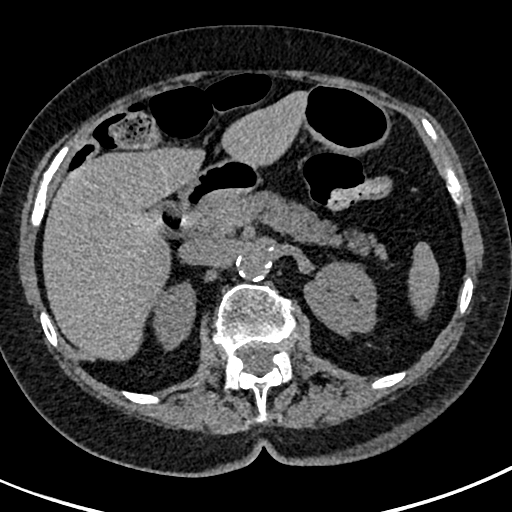
[im 13/164  lung]
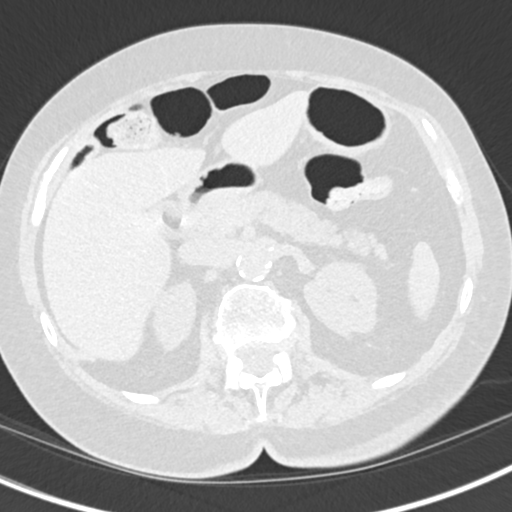
[im 26/164  lung]
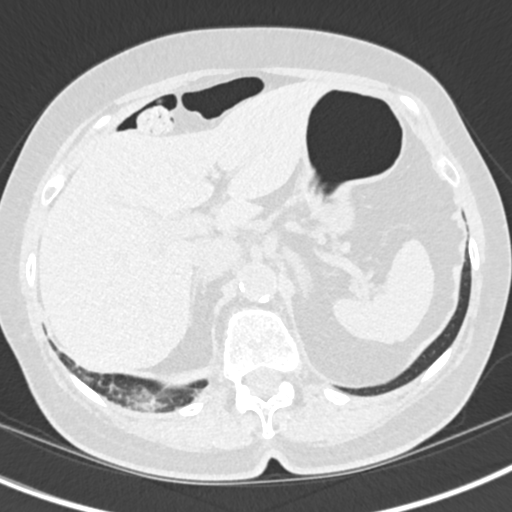
[im 38/164  lung]
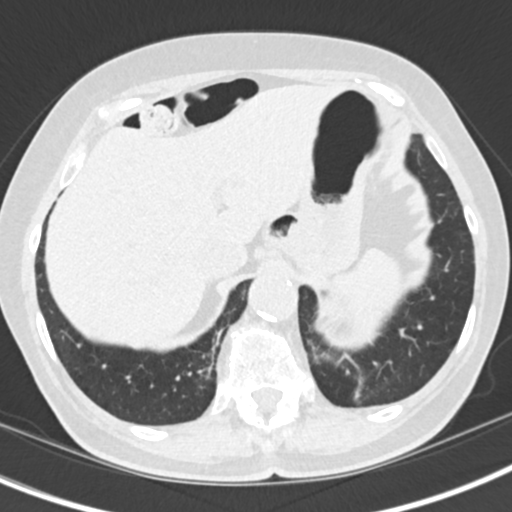
[im 51/164  lung]
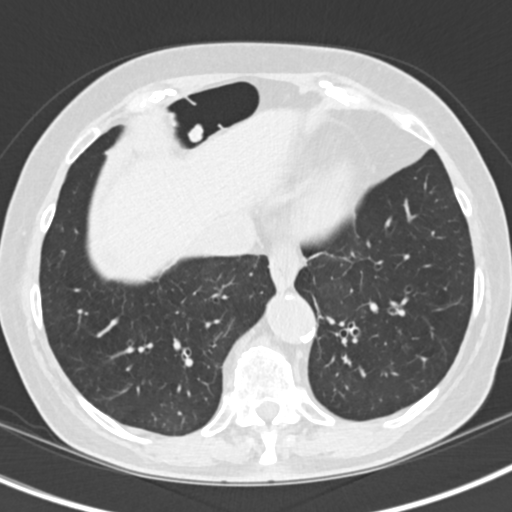
[im 63/164  mediastinal]
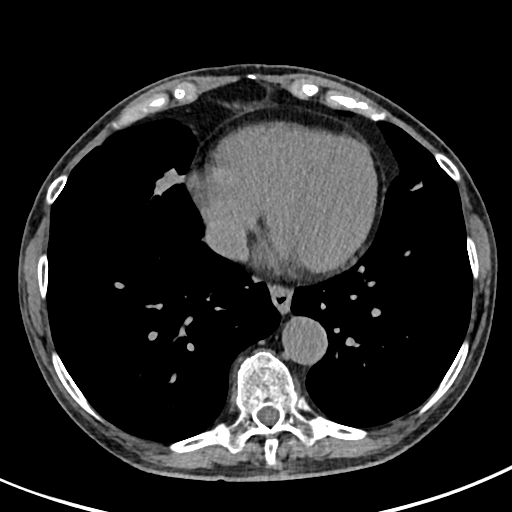
[im 63/164  lung]
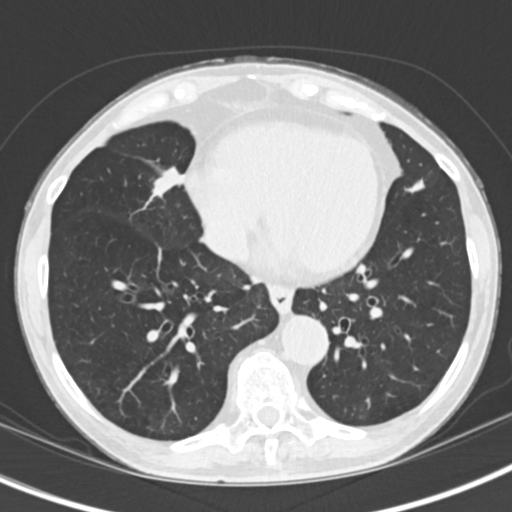
[im 76/164  lung]
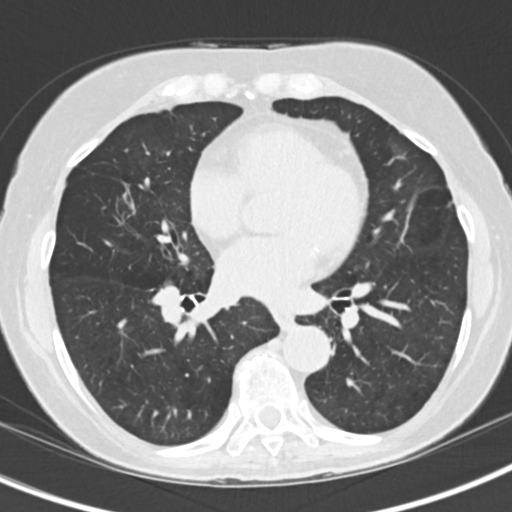
[im 88/164  lung]
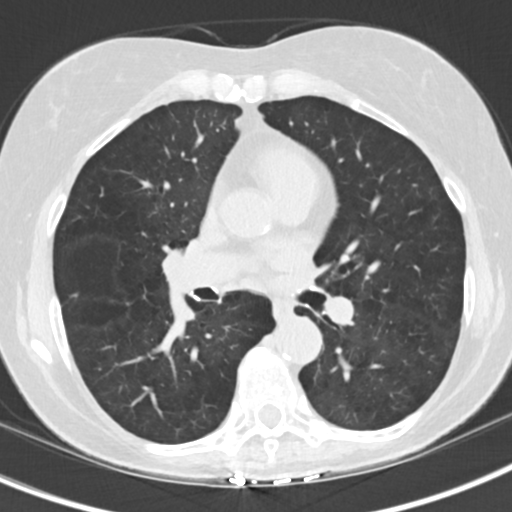
[im 101/164  lung]
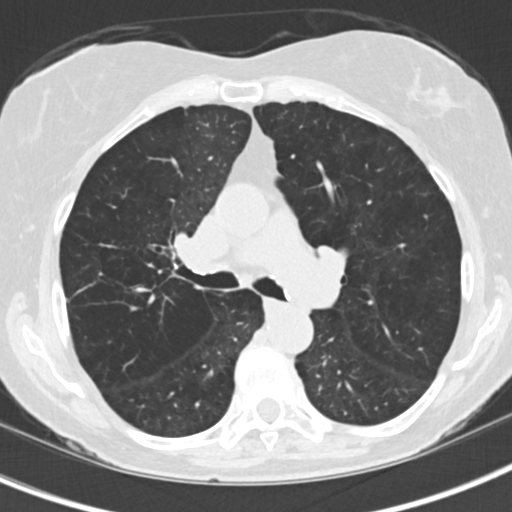
[im 113/164  mediastinal]
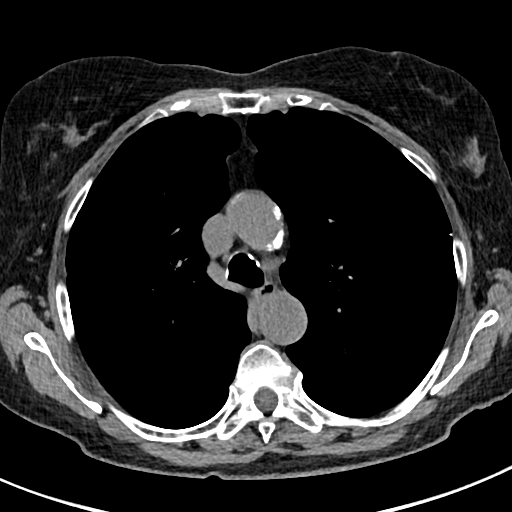
[im 113/164  lung]
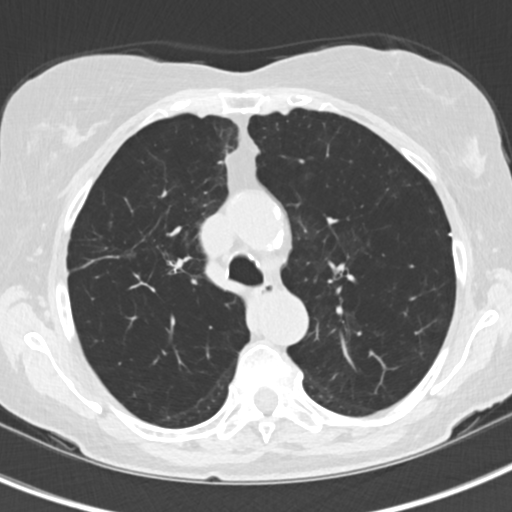
[im 126/164  lung]
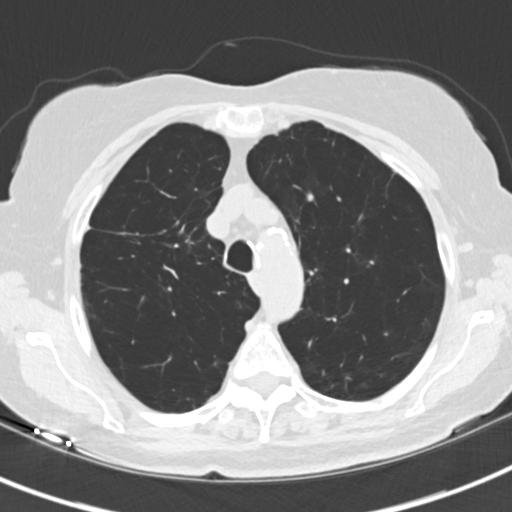
[im 138/164  lung]
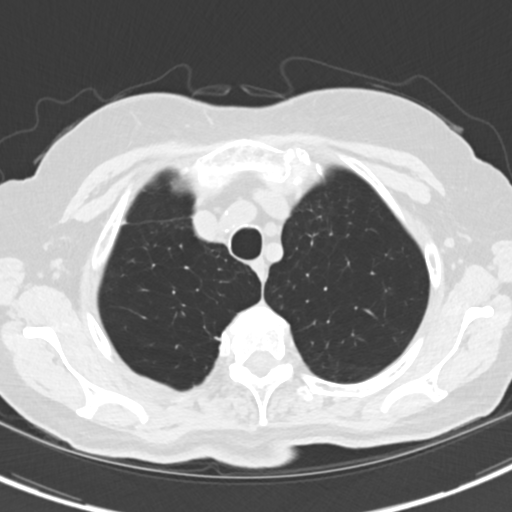
[im 151/164  lung]
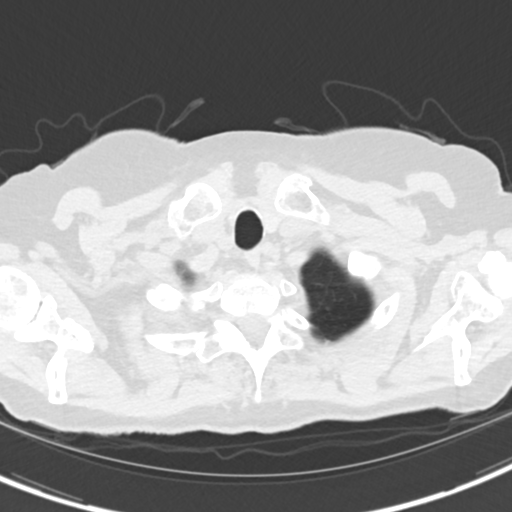

[Series 5: coronal chest · coronal · 0.60mm/px · 3 of 135 slices shown]
[im 27/135  lung]
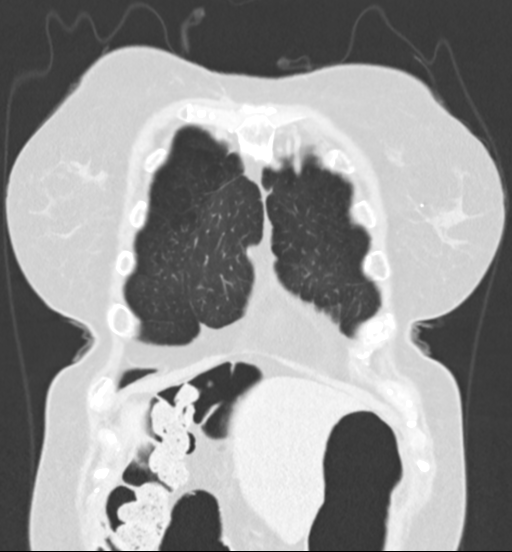
[im 54/135  lung]
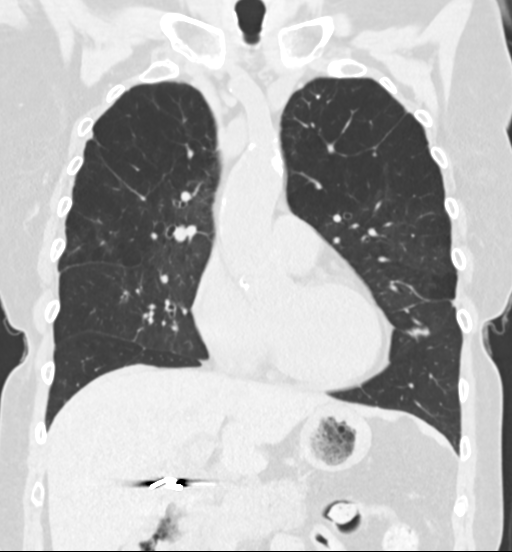
[im 81/135  lung]
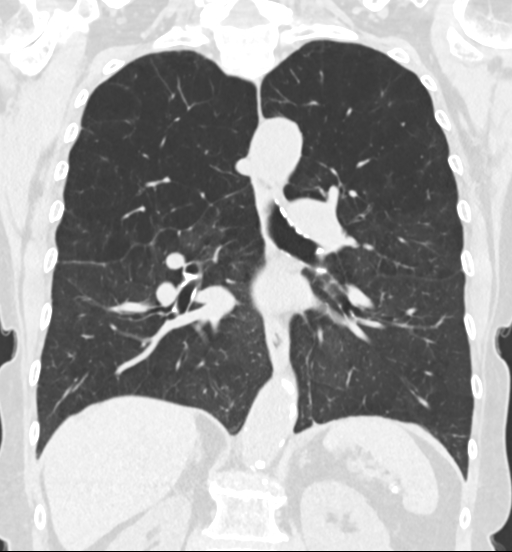

[15 of 36 positions shown; findings below may reference images not displayed]

FINDINGS: Cardiovascular: Aortic atherosclerosis. Normal heart size.
Three-vessel coronary artery calcifications. No pericardial
effusion.

Mediastinum/Nodes: No enlarged mediastinal, hilar, or axillary lymph
nodes. Thyroid gland, trachea, and esophagus demonstrate no
significant findings.

Lungs/Pleura: Severe centrilobular emphysema. Bandlike
consolidations of the medial right middle lobe and lingula (series
3, image 21). Occasional tiny benign pulmonary nodules, stable
compared to prior examination dated 05/03/2012. Mild, tubular
bronchiectasis throughout. No pleural effusion or pneumothorax.

Upper Abdomen: No acute abnormality.

Musculoskeletal: No chest wall mass or suspicious bone lesions
identified.
IMPRESSION: 1.  Severe emphysema.

2. Bandlike consolidations of the medial right middle lobe and
lingula (series 3, image 21). These are unchanged compared to prior
CT dated 05/03/2012 and consistent with sequelae of prior atypical
infection, particularly atypical mycobacterium.

3.  Coronary artery disease and aortic atherosclerosis.

## 2019-12-13 ENCOUNTER — Other Ambulatory Visit: Payer: Self-pay

## 2019-12-13 ENCOUNTER — Ambulatory Visit: Payer: PPO | Admitting: Internal Medicine

## 2019-12-13 DIAGNOSIS — R0602 Shortness of breath: Secondary | ICD-10-CM | POA: Diagnosis not present

## 2019-12-13 LAB — PULMONARY FUNCTION TEST

## 2019-12-20 NOTE — Procedures (Signed)
[facility] [place], [address]  DATE OF SERVICE: December 13, 2019  Complete Pulmonary Function Testing Interpretation:  FINDINGS:  The forced vital capacity is 1.67 L which is 65% of predicted and is decreased.  The FEV1 is 0.72 L which is 36% of predicted and is severely decreased.  FEV1 FVC ratio is decreased.  Postbronchodilator there is no significant change in the FEV1 clinical improvement may still occur in the absence of spirometric improvement.  Total lung capacity is increased residual volume is increased.  Functional residual capacity ratio is increased.  FRC is increased.  DLCO is severely decreased.  IMPRESSION:  This pulmonary function study is consistent with severe obstructive lung disease.  DLCO is also severely decreased.  Allyne Gee, MD [facility] Pulmonary Critical Care Medicine Sleep Medicine

## 2019-12-22 NOTE — Progress Notes (Signed)
PFT's reviewed and will be discussed on next visit

## 2020-01-15 ENCOUNTER — Ambulatory Visit: Payer: PPO | Admitting: Adult Health

## 2020-01-15 ENCOUNTER — Encounter: Payer: Self-pay | Admitting: Adult Health

## 2020-01-15 ENCOUNTER — Other Ambulatory Visit: Payer: Self-pay

## 2020-01-15 VITALS — BP 120/66 | HR 109 | Temp 97.9°F | Resp 16 | Ht 62.0 in | Wt 138.0 lb

## 2020-01-15 DIAGNOSIS — J9611 Chronic respiratory failure with hypoxia: Secondary | ICD-10-CM

## 2020-01-15 DIAGNOSIS — Z9981 Dependence on supplemental oxygen: Secondary | ICD-10-CM

## 2020-01-15 DIAGNOSIS — J449 Chronic obstructive pulmonary disease, unspecified: Secondary | ICD-10-CM

## 2020-01-15 DIAGNOSIS — K219 Gastro-esophageal reflux disease without esophagitis: Secondary | ICD-10-CM | POA: Diagnosis not present

## 2020-01-15 DIAGNOSIS — Z23 Encounter for immunization: Secondary | ICD-10-CM

## 2020-01-15 NOTE — Progress Notes (Signed)
[facility] [place], [place] [address]  Pulmonary Sleep Medicine   Office Visit Note  Patient Name: Joyce Rodriguez DOB: [birth year] MRN [id number]  Date of Service: 01/15/2020  Complaints/HPI: Pt is here for pulmonary follow up. She has a history of chronic respiratory failure.  She is oxygen dependant with 2-3 lpm.  She reports some sob at times, but overall does well. She continues to use albuterol, Advair and Spiriva inhalers.      ROS  General: (-) fever, (-) chills, (-) night sweats, (-) weakness Skin: (-) rashes, (-) itching,. Eyes: (-) visual changes, (-) redness, (-) itching. Nose and Sinuses: (-) nasal stuffiness or itchiness, (-) postnasal drip, (-) nosebleeds, (-) sinus trouble. Mouth and Throat: (-) sore throat, (-) hoarseness. Neck: (-) swollen glands, (-) enlarged thyroid, (-) neck pain. Respiratory: - cough, (-) bloody sputum, +shortness of breath, - wheezing. Cardiovascular: - ankle swelling, (-) chest pain. Lymphatic: (-) lymph node enlargement. Neurologic: (-) numbness, (-) tingling. Psychiatric: (-) anxiety, (-) depression   Current Medication: Outpatient Encounter Medications as of 01/15/2020  Medication Sig Note  . acetaminophen (TYLENOL) 500 MG tablet Take 500 mg by mouth every 6 (six) hours as needed.   Marland Kitchen albuterol (VENTOLIN HFA) 108 (90 Base) MCG/ACT inhaler INHALE 2 PUFFS BY MOUTH EVERY 6 HOURS AS NEEDED   . cholecalciferol (VITAMIN D) 1000 units tablet Take 1,000 Units by mouth daily.   Marland Kitchen escitalopram (LEXAPRO) 10 MG tablet Take 1 tablet (10 mg total) by mouth daily.   Marland Kitchen ezetimibe (ZETIA) 10 MG tablet Take 10 mg by mouth daily.   . Fluticasone-Salmeterol (ADVAIR DISKUS) 250-50 MCG/DOSE AEPB INHALE 1 PUFF INTO THE LUNGS TWICE DAILY   . Multiple Vitamin (MULTIVITAMIN WITH MINERALS) TABS tablet Take 1 tablet by mouth daily.   . OXYGEN Inhale 2 L into the lungs.  11/16/2018: 3l at night, 2 or 3/ for portable   . potassium chloride  (K-DUR) 10 MEQ tablet Take 1 tablet (10 mEq total) by mouth daily.   Marland Kitchen SPIRIVA HANDIHALER 18 MCG inhalation capsule PLACE 1 CAPSULE INTO INHALER AND INHALE DAILY   . traMADol (ULTRAM) 50 MG tablet TK 1 T PO Q 6 H PRN P 05/02/2019: As needed  . [DISCONTINUED] azithromycin (ZITHROMAX) 250 MG tablet Take as directed   . [DISCONTINUED] predniSONE (DELTASONE) 10 MG tablet Use per dose pack    No facility-administered encounter medications on file as of 01/15/2020.    Surgical History: Past Surgical History:  Procedure Laterality Date  . ABDOMINAL HYSTERECTOMY    . CHOLECYSTECTOMY    . ESOPHAGEAL MANOMETRY N/A 09/13/2017   Procedure: ESOPHAGEAL MANOMETRY (EM);  Surgeon: Mauri Pole, MD;  Location: [facility];  Service: Endoscopy;  Laterality: N/A;  . ESOPHAGOGASTRODUODENOSCOPY (EGD) WITH PROPOFOL N/A 05/11/2017   Procedure: ESOPHAGOGASTRODUODENOSCOPY (EGD) WITH PROPOFOL;  Surgeon: Lin Landsman, MD;  Location: [facility] ENDOSCOPY;  Service: Gastroenterology;  Laterality: N/A;  . FOOT SURGERY    . LAPAROSCOPIC NISSEN FUNDOPLICATION N/A 10/12/5364   Procedure: LAPAROSCOPIC NISSEN FUNDOPLICATION;  Surgeon: Jules Husbands, MD;  Location: [facility] ORS;  Service: General;  Laterality: N/A;  . NISSEN FUNDOPLICATION N/A 07/15/345   Procedure: NISSEN FUNDOPLICATION;  Surgeon: Jules Husbands, MD;  Location: [facility] ORS;  Service: General;  Laterality: N/A;    Medical History: Past Medical History:  Diagnosis Date  . Arthritis   . Complication of anesthesia    difficulty waking up  . COPD (chronic obstructive pulmonary disease) ([place])   .  Dyspnea   . Emphysema of lung ([place])   . GERD (gastroesophageal reflux disease)   . Hyperlipidemia   . Hypertension   . Osteoporosis   . Oxygen deficiency    uses O2 at night    Family History: Family History  Problem Relation Age of Onset  . Ovarian cancer Mother   . Emphysema Father   . Melanoma Sister   . Lung cancer Brother     Social  History: Social History   Socioeconomic History  . Marital status: Married    Spouse name: Not on file  . Number of children: Not on file  . Years of education: 69  . Highest education level: 12th grade  Occupational History  . Occupation: retired  Tobacco Use  . Smoking status: Former Smoker    Types: Cigarettes    Quit date: 03/18/2001    Years since quitting: 18.8  . Smokeless tobacco: Never Used  Vaping Use  . Vaping Use: Never used  Substance and Sexual Activity  . Alcohol use: Yes    Comment: rare  . Drug use: No  . Sexual activity: Not Currently  Other Topics Concern  . Not on file  Social History Narrative  . Not on file   Social Determinants of Health   Financial Resource Strain:   . Difficulty of Paying Living Expenses: Not on file  Food Insecurity:   . Worried About Charity fundraiser in the Last Year: Not on file  . Ran Out of Food in the Last Year: Not on file  Transportation Needs:   . Lack of Transportation (Medical): Not on file  . Lack of Transportation (Non-Medical): Not on file  Physical Activity:   . Days of Exercise per Week: Not on file  . Minutes of Exercise per Session: Not on file  Stress:   . Feeling of Stress : Not on file  Social Connections:   . Frequency of Communication with Friends and Family: Not on file  . Frequency of Social Gatherings with Friends and Family: Not on file  . Attends Religious Services: Not on file  . Active Member of Clubs or Organizations: Not on file  . Attends Archivist Meetings: Not on file  . Marital Status: Not on file  Intimate Partner Violence:   . Fear of Current or Ex-Partner: Not on file  . Emotionally Abused: Not on file  . Physically Abused: Not on file  . Sexually Abused: Not on file    Vital Signs: Blood pressure 120/66, pulse (!) 109, temperature 97.9 F (36.6 C), resp. rate 16, height 5\' 2"  (1.575 m), weight 138 lb (62.6 kg), SpO2 95 %.  Examination: General Appearance: The  patient is well-developed, well-nourished, and in no distress. Skin: Gross inspection of skin unremarkable. Head: normocephalic, no gross deformities. Eyes: no gross deformities noted. ENT: ears appear grossly normal no exudates. Neck: Supple. No thyromegaly. No LAD. Respiratory: course breath sounds. Cardiovascular: Normal S1 and S2 without murmur or rub. Extremities: No cyanosis. pulses are equal. Neurologic: Alert and oriented. No involuntary movements.  LABS: Recent Results (from the past 2160 hour(s))  Pulmonary Function Test     Status: None   Collection Time: 12/13/19 10:00 AM  Result Value Ref Range   FEV1     FVC     FEV1/FVC     TLC     DLCO      Radiology: NM Myocar Multi W/Spect W/Wall Motion / EF  Result Date: 07/03/2019  Blood pressure demonstrated a normal response to exercise.  There was no ST segment deviation noted during stress.  The study is normal.  This is a low risk study.  The left ventricular ejection fraction is normal (55-65%).     No results found.  No results found.    Assessment and Plan: Patient Active Problem List   Diagnosis Date Noted  . Obstructive chronic bronchitis with exacerbation ([place]) 01/30/2019  . Oxygen dependent 01/30/2019  . S/P Nissen fundoplication (with gastrostomy tube placement) ([place]) 10/19/2017  . CAD (coronary artery disease), native coronary artery 10/18/2017  . Hiatal hernia 10/15/2017  . Abnormal EKG 10/15/2017  . Dysphagia   . Schatzki's ring   . Gastric erosion   . Advanced care planning/counseling discussion 04/21/2017  . Acute on chronic respiratory failure with hypoxia ([place]) 05/08/2016  . COPD exacerbation ([place]) 05/08/2016  . Hyperglycemia 05/08/2016  . Dyspnea 05/06/2016  . Encounter for hepatitis C screening test for low risk patient 03/19/2016  . Essential hypertension 03/19/2015  . COPD (chronic obstructive pulmonary disease) ([place]) 03/19/2015  . Hyperlipidemia 03/19/2015  . Osteoarthritis of  both hands 03/19/2015  . Osteoporosis 03/19/2015  . Acid reflux 03/19/2015  . Major depression, chronic 09/12/2014    1. Chronic respiratory failure with hypoxia (HCC) Continue to use oxygen and inhalers  2. Oxygen dependent Continue to use oxygen 2-3 LPM.   3. Gastroesophageal reflux disease without esophagitis Stable, continue with current management.   4. Chronic obstructive pulmonary disease, unspecified COPD type ([place]) Continue inhalers as directed.   5. Flu vaccine need - Flu Vaccine MDCK QUAD PF  General Counseling: I have discussed the findings of the evaluation and examination with Dorian Pod.  I have also discussed any further diagnostic evaluation thatmay be needed or ordered today. Maikayla verbalizes understanding of the findings of todays visit. We also reviewed her medications today and discussed drug interactions and side effects including but not limited excessive drowsiness and altered mental states. We also discussed that there is always a risk not just to her but also people around her. she has been encouraged to call the office with any questions or concerns that should arise related to todays visit.  Orders Placed This Encounter  Procedures  . Flu Vaccine MDCK QUAD PF     Time spent: 30 This patient was seen by Orson Gear AGNP-C in Collaboration with Dr. Devona Konig as a part of collaborative care agreement.   I have personally obtained a history, examined the patient, evaluated laboratory and imaging results, formulated the assessment and plan and placed orders.    Allyne Gee, MD [facility] Pulmonary and Critical Care Sleep medicine

## 2020-02-13 DIAGNOSIS — D225 Melanocytic nevi of trunk: Secondary | ICD-10-CM | POA: Diagnosis not present

## 2020-02-13 DIAGNOSIS — D2262 Melanocytic nevi of left upper limb, including shoulder: Secondary | ICD-10-CM | POA: Diagnosis not present

## 2020-02-13 DIAGNOSIS — L821 Other seborrheic keratosis: Secondary | ICD-10-CM | POA: Diagnosis not present

## 2020-02-13 DIAGNOSIS — D2261 Melanocytic nevi of right upper limb, including shoulder: Secondary | ICD-10-CM | POA: Diagnosis not present

## 2020-03-01 DIAGNOSIS — M7582 Other shoulder lesions, left shoulder: Secondary | ICD-10-CM | POA: Diagnosis not present

## 2020-03-01 DIAGNOSIS — M19012 Primary osteoarthritis, left shoulder: Secondary | ICD-10-CM | POA: Diagnosis not present

## 2020-03-01 DIAGNOSIS — M19011 Primary osteoarthritis, right shoulder: Secondary | ICD-10-CM | POA: Diagnosis not present

## 2020-03-01 DIAGNOSIS — M7581 Other shoulder lesions, right shoulder: Secondary | ICD-10-CM | POA: Diagnosis not present

## 2020-04-22 ENCOUNTER — Ambulatory Visit: Payer: PPO | Admitting: Internal Medicine

## 2020-04-22 ENCOUNTER — Telehealth: Payer: Self-pay

## 2020-04-22 ENCOUNTER — Encounter: Payer: Self-pay | Admitting: Internal Medicine

## 2020-04-22 VITALS — BP 161/81 | HR 88 | Temp 98.4°F | Resp 16 | Ht 62.0 in | Wt 140.0 lb

## 2020-04-22 DIAGNOSIS — J441 Chronic obstructive pulmonary disease with (acute) exacerbation: Secondary | ICD-10-CM

## 2020-04-22 MED ORDER — AZITHROMYCIN 250 MG PO TABS: ORAL_TABLET | ORAL | 0 refills | Status: DC

## 2020-04-22 MED ORDER — BENZONATATE 100 MG PO CAPS: 100.0000 mg | ORAL_CAPSULE | Freq: Two times a day (BID) | ORAL | 0 refills | Status: DC | PRN

## 2020-04-22 NOTE — Telephone Encounter (Signed)
Virtual visit if possible

## 2020-04-22 NOTE — Progress Notes (Signed)
[facility] [place], [place] [address]  Internal MEDICINE  Telephone Visit  Patient Name: Joyce Rodriguez  [id number]  [id number]  Date of Service: 04/22/2020  I connected with the patient at 11:17am by telephone and verified the patients identity using two identifiers.   I discussed the limitations, risks, security and privacy concerns of performing an evaluation and management service by telephone and the availability of in person appointments. I also discussed with the patient that there may be a patient responsible charge related to the service.  The patient expressed understanding and agrees to proceed.    Chief Complaint  Patient presents with  . telephone assesment    [phone]   . Telephone Screen    Phone or video is fine  . Acute Visit    Neg. Covid test, congestion has gotten worse in last week, OTC meds are not helping, coughing spells takes her breath    HPI  Patient has had sinus congestions for 2 weeks. Mucinex helps some, but not enough. She states it is in her chest now and is causing a cough which takes her breath away. She reports clear discharge with cough. She is using her same level oxygen, but is now keeping it on 24/7. Talking triggers her cough. She denies  fever. She is taking all of her inhalers, but not her nebulizer. Patient is tired. Symptoms are staying the same. She reported a sore throat and sinus headaches initially, but this has resolved. She admits to post nasal drip. Her Covid home test was negative this morning. She was vaccinated with J&J on November 19, 2019, but has not received a booster.   Current Medication: Outpatient Encounter Medications as of 04/22/2020  Medication Sig Note  . acetaminophen (TYLENOL) 500 MG tablet Take 500 mg by mouth every 6 (six) hours as needed.   Marland Kitchen albuterol (VENTOLIN HFA) 108 (90 Base) MCG/ACT inhaler INHALE 2 PUFFS BY MOUTH EVERY 6 HOURS AS NEEDED   . azithromycin (ZITHROMAX) 250 MG tablet  Take one tab a day for 14 days for COPD flare up   . benzonatate (TESSALON) 100 MG capsule Take 1 capsule (100 mg total) by mouth 2 (two) times daily as needed for cough.   . cholecalciferol (VITAMIN D) 1000 units tablet Take 1,000 Units by mouth daily.   Marland Kitchen escitalopram (LEXAPRO) 10 MG tablet Take 1 tablet (10 mg total) by mouth daily.   Marland Kitchen ezetimibe (ZETIA) 10 MG tablet Take 10 mg by mouth daily.   . Fluticasone-Salmeterol (ADVAIR DISKUS) 250-50 MCG/DOSE AEPB INHALE 1 PUFF INTO THE LUNGS TWICE DAILY   . Multiple Vitamin (MULTIVITAMIN WITH MINERALS) TABS tablet Take 1 tablet by mouth daily.   . OXYGEN Inhale 2 L into the lungs.  11/16/2018: 3l at night, 2 or 3/ for portable   . potassium chloride (K-DUR) 10 MEQ tablet Take 1 tablet (10 mEq total) by mouth daily.   Marland Kitchen SPIRIVA HANDIHALER 18 MCG inhalation capsule PLACE 1 CAPSULE INTO INHALER AND INHALE DAILY   . traMADol (ULTRAM) 50 MG tablet TK 1 T PO Q 6 H PRN P 05/02/2019: As needed   No facility-administered encounter medications on file as of 04/22/2020.    Surgical History: Past Surgical History:  Procedure Laterality Date  . ABDOMINAL HYSTERECTOMY    . CHOLECYSTECTOMY    . ESOPHAGEAL MANOMETRY N/A 09/13/2017   Procedure: ESOPHAGEAL MANOMETRY (EM);  Surgeon: Mauri Pole, MD;  Location: [facility];  Service: Endoscopy;  Laterality: N/A;  .  ESOPHAGOGASTRODUODENOSCOPY (EGD) WITH PROPOFOL N/A 05/11/2017   Procedure: ESOPHAGOGASTRODUODENOSCOPY (EGD) WITH PROPOFOL;  Surgeon: Lin Landsman, MD;  Location: [place] [facility];  Service: Gastroenterology;  Laterality: N/A;  . FOOT SURGERY    . LAPAROSCOPIC NISSEN FUNDOPLICATION N/A [id number]   Procedure: LAPAROSCOPIC NISSEN FUNDOPLICATION;  Surgeon: Jules Husbands, MD;  Location: [facility] ORS;  Service: General;  Laterality: N/A;  . NISSEN FUNDOPLICATION N/A [id number]   Procedure: NISSEN FUNDOPLICATION;  Surgeon: Jules Husbands, MD;  Location: [facility] ORS;  Service: General;  Laterality: N/A;     Medical History: Past Medical History:  Diagnosis Date  . Arthritis   . Complication of anesthesia    difficulty waking up  . COPD (chronic obstructive pulmonary disease) ([place])   . Dyspnea   . Emphysema of lung ([place])   . GERD (gastroesophageal reflux disease)   . Hyperlipidemia   . Hypertension   . Osteoporosis   . Oxygen deficiency    uses O2 at night    Family History: Family History  Problem Relation Age of Onset  . Ovarian cancer Mother   . Emphysema Father   . Melanoma Sister   . Lung cancer Brother     Social History   Socioeconomic History  . Marital status: Married    Spouse name: Not on file  . Number of children: Not on file  . Years of education: 60  . Highest education level: 12th grade  Occupational History  . Occupation: retired  Tobacco Use  . Smoking status: Former Smoker    Types: Cigarettes    Quit date: 03/18/2001    Years since quitting: 19.1  . Smokeless tobacco: Never Used  Vaping Use  . Vaping Use: Never used  Substance and Sexual Activity  . Alcohol use: Yes    Comment: rare  . Drug use: No  . Sexual activity: Not Currently  Other Topics Concern  . Not on file  Social History Narrative  . Not on file   Social Determinants of Health   Financial Resource Strain: Not on file  Food Insecurity: Not on file  Transportation Needs: Not on file  Physical Activity: Not on file  Stress: Not on file  Social Connections: Not on file  Intimate Partner Violence: Not on file      Review of Systems  Constitutional: Positive for fatigue. Negative for chills and fever.  HENT: Positive for congestion, postnasal drip, rhinorrhea and sinus pressure. Negative for sinus pain and sore throat.   Respiratory: Positive for cough and shortness of breath. Negative for chest tightness.        Clear discharge with cough  Cardiovascular: Negative for chest pain.  Neurological: Negative for speech difficulty and headaches.  Psychiatric/Behavioral:  Negative for agitation and behavioral problems. The patient is not nervous/anxious.     Vital Signs: BP (!) 161/81 Comment: 162/74 second reading  Pulse 88   Temp 98.4 F (36.9 C)   Resp 16   Ht 5\' 2"  (1.575 m)   Wt 140 lb (63.5 kg)   SpO2 93% Comment: with oxygen continuously  BMI 25.61 kg/m    Observation/Objective: Patient able to carry out conversation on the phone, but does have noticeable cough.   Assessment/Plan: 1. COPD exacerbation ([place]) Started patient on the following medications to help with current cough and congestion. Will have patient follow up in office on Friday for possible steroid injection. - azithromycin (ZITHROMAX) 250 MG tablet; Take one tab a day for 14 days for COPD flare up  Dispense: 14 tablet; Refill: 0 - benzonatate (TESSALON) 100 MG capsule; Take 1 capsule (100 mg total) by mouth 2 (two) times daily as needed for cough.  Dispense: 20 capsule; Refill: 0   General Counseling: eden toohey understanding of the findings of today's phone visit and agrees with plan of treatment. I have discussed any further diagnostic evaluation that may be needed or ordered today. We also reviewed her medications today. she has been encouraged to call the office with any questions or concerns that should arise related to todays visit.    No orders of the defined types were placed in this encounter.   Meds ordered this encounter  Medications  . azithromycin (ZITHROMAX) 250 MG tablet    Sig: Take one tab a day for 14 days for COPD flare up    Dispense:  14 tablet    Refill:  0  . benzonatate (TESSALON) 100 MG capsule    Sig: Take 1 capsule (100 mg total) by mouth 2 (two) times daily as needed for cough.    Dispense:  20 capsule    Refill:  0    Time spent: 13 Minutes    Dr Lavera Guise Internal medicine

## 2020-04-26 ENCOUNTER — Other Ambulatory Visit: Payer: Self-pay

## 2020-04-26 ENCOUNTER — Ambulatory Visit: Payer: PPO | Admitting: Internal Medicine

## 2020-04-26 ENCOUNTER — Other Ambulatory Visit: Payer: Self-pay | Admitting: Internal Medicine

## 2020-04-26 ENCOUNTER — Encounter: Payer: Self-pay | Admitting: Internal Medicine

## 2020-04-26 VITALS — BP 170/90 | HR 94 | Temp 97.7°F | Resp 16 | Ht 62.0 in | Wt 141.4 lb

## 2020-04-26 DIAGNOSIS — R0602 Shortness of breath: Secondary | ICD-10-CM | POA: Diagnosis not present

## 2020-04-26 DIAGNOSIS — J441 Chronic obstructive pulmonary disease with (acute) exacerbation: Secondary | ICD-10-CM | POA: Diagnosis not present

## 2020-04-26 MED ORDER — METHYLPREDNISOLONE ACETATE 80 MG/ML IJ SUSP
80.0000 mg | Freq: Once | INTRAMUSCULAR | Status: AC
  Administered 2020-04-26: 80 mg via INTRAMUSCULAR

## 2020-04-26 MED ORDER — OMEPRAZOLE 20 MG PO CPDR: 20.0000 mg | DELAYED_RELEASE_CAPSULE | Freq: Every day | ORAL | 3 refills | Status: DC

## 2020-04-26 MED ORDER — PREDNISONE 10 MG (21) PO TBPK: ORAL_TABLET | ORAL | 0 refills | Status: DC

## 2020-04-26 NOTE — Progress Notes (Signed)
[facility] [place], [place] [address]  Internal MEDICINE  Office Visit Note  Patient Name: Joyce Rodriguez  [id number]  [id number]  Date of Service: 04/26/2020  Chief Complaint  Patient presents with  . Acute Visit    Cough constant, worse at night, meds not really helping, mucus is coming out  . COPD     HPI Pt is here for a sick visit. Follow up after telehealth for COPD exacerbation. Still not doing well despite starting the Azithromycin and tessalon pearls. Using Oxygen 24/7. Obtained spirometry in office today. She is very tired. Still coughing a lot at night and during the day too. Productive cough is clear. Will give steroid injection in office. Prior to this she had a lot of sinus congestion, but is in her chest now. High BP due to stress of illness. Will monitor at next visit in 1 week.   Current Medication:  Outpatient Encounter Medications as of 04/26/2020  Medication Sig Note  . acetaminophen (TYLENOL) 500 MG tablet Take 500 mg by mouth every 6 (six) hours as needed.   Marland Kitchen albuterol (VENTOLIN HFA) 108 (90 Base) MCG/ACT inhaler INHALE 2 PUFFS BY MOUTH EVERY 6 HOURS AS NEEDED   . azithromycin (ZITHROMAX) 250 MG tablet Take one tab a day for 14 days for COPD flare up   . benzonatate (TESSALON) 100 MG capsule Take 1 capsule (100 mg total) by mouth 2 (two) times daily as needed for cough.   . cholecalciferol (VITAMIN D) 1000 units tablet Take 1,000 Units by mouth daily.   Marland Kitchen escitalopram (LEXAPRO) 10 MG tablet Take 1 tablet (10 mg total) by mouth daily.   Marland Kitchen ezetimibe (ZETIA) 10 MG tablet Take 10 mg by mouth daily.   . Fluticasone-Salmeterol (ADVAIR DISKUS) 250-50 MCG/DOSE AEPB INHALE 1 PUFF INTO THE LUNGS TWICE DAILY   . Multiple Vitamin (MULTIVITAMIN WITH MINERALS) TABS tablet Take 1 tablet by mouth daily.   . OXYGEN Inhale 2 L into the lungs.  11/16/2018: 3l at night, 2 or 3/ for portable   . potassium chloride (K-DUR) 10 MEQ tablet Take 1 tablet  (10 mEq total) by mouth daily.   Marland Kitchen SPIRIVA HANDIHALER 18 MCG inhalation capsule PLACE 1 CAPSULE INTO INHALER AND INHALE DAILY   . traMADol (ULTRAM) 50 MG tablet TK 1 T PO Q 6 H PRN P 05/02/2019: As needed   No facility-administered encounter medications on file as of 04/26/2020.      Medical History: Past Medical History:  Diagnosis Date  . Arthritis   . Complication of anesthesia    difficulty waking up  . COPD (chronic obstructive pulmonary disease) ([place])   . Dyspnea   . Emphysema of lung ([place])   . GERD (gastroesophageal reflux disease)   . Hyperlipidemia   . Hypertension   . Osteoporosis   . Oxygen deficiency    uses O2 at night     Vital Signs: BP (!) 170/90   Pulse 94   Temp 97.7 F (36.5 C)   Resp 16   Ht 5\' 2"  (1.575 m)   Wt 141 lb 6.4 oz (64.1 kg)   SpO2 98%   BMI 25.86 kg/m    Review of Systems  Constitutional: Negative for fatigue and fever.  HENT: Positive for congestion, sinus pressure and sinus pain. Negative for mouth sores and postnasal drip.   Respiratory: Positive for cough and shortness of breath.   Cardiovascular: Negative for chest pain.  Genitourinary: Negative for flank pain.  Psychiatric/Behavioral: Negative.     Physical Exam Constitutional:      General: She is not in acute distress.    Appearance: She is well-developed and well-nourished. She is ill-appearing. She is not diaphoretic.  HENT:     Head: Normocephalic and atraumatic.     Mouth/Throat:     Mouth: Oropharynx is clear and moist.     Pharynx: No oropharyngeal exudate.  Eyes:     Extraocular Movements: EOM normal.     Pupils: Pupils are equal, round, and reactive to light.  Neck:     Thyroid: No thyromegaly.     Vascular: No JVD.     Trachea: No tracheal deviation.  Cardiovascular:     Rate and Rhythm: Normal rate and regular rhythm.     Heart sounds: Normal heart sounds. No murmur heard. No friction rub. No gallop.   Pulmonary:     Effort: No respiratory distress.      Breath sounds: Wheezing present. No rales.     Comments: Decreased breath sounds Chest:     Chest wall: No tenderness.  Abdominal:     General: Bowel sounds are normal.     Palpations: Abdomen is soft.  Musculoskeletal:        General: Normal range of motion.     Cervical back: Normal range of motion and neck supple.  Lymphadenopathy:     Cervical: No cervical adenopathy.  Skin:    General: Skin is warm and dry.  Neurological:     Mental Status: She is alert and oriented to person, place, and time.     Cranial Nerves: No cranial nerve deficit.  Psychiatric:        Mood and Affect: Mood and affect normal.        Behavior: Behavior normal.        Thought Content: Thought content normal.        Judgment: Judgment normal.       Assessment/Plan: 1. SOB (shortness of breath) Continue current medications and complete steroid taper as directed. - Spirometry with Graph - predniSONE (STERAPRED UNI-PAK 21 TAB) 10 MG (21) TBPK tablet; Use as directed for 6 days  Dispense: 21 tablet; Refill: 0 - omeprazole (PRILOSEC) 20 MG capsule; Take 1 capsule (20 mg total) by mouth daily.  Dispense: 30 capsule; Refill: 3  2. COPD exacerbation (HCC) Continue current medications and add steroid taper as directed. Will f/u in 1 week. Depo-medrol 80mg  injection given in office. - Spirometry with Graph - predniSONE (STERAPRED UNI-PAK 21 TAB) 10 MG (21) TBPK tablet; Use as directed for 6 days  Dispense: 21 tablet; Refill: 0 - omeprazole (PRILOSEC) 20 MG capsule; Take 1 capsule (20 mg total) by mouth daily.  Dispense: 30 capsule; Refill: 3 - methylPREDNISolone acetate (DEPO-MEDROL) injection 80 mg  General Counseling: Delrae Sawyers understanding of the findings of todays visit and agrees with plan of treatment. I have discussed any further diagnostic evaluation that may be needed or ordered today. We also reviewed her medications today. she has been encouraged to call the office with any questions or  concerns that should arise related to todays visit.    Counseling:    Orders Placed This Encounter  Procedures  . Spirometry with Graph    No orders of the defined types were placed in this encounter.   Time spent: 15 Minutes

## 2020-05-01 DIAGNOSIS — R7309 Other abnormal glucose: Secondary | ICD-10-CM | POA: Diagnosis not present

## 2020-05-01 DIAGNOSIS — E559 Vitamin D deficiency, unspecified: Secondary | ICD-10-CM | POA: Diagnosis not present

## 2020-05-01 DIAGNOSIS — Z79899 Other long term (current) drug therapy: Secondary | ICD-10-CM | POA: Diagnosis not present

## 2020-05-01 DIAGNOSIS — E782 Mixed hyperlipidemia: Secondary | ICD-10-CM | POA: Diagnosis not present

## 2020-05-06 ENCOUNTER — Encounter: Payer: Self-pay | Admitting: Physician Assistant

## 2020-05-06 ENCOUNTER — Ambulatory Visit: Payer: PPO | Admitting: Internal Medicine

## 2020-05-06 DIAGNOSIS — R0602 Shortness of breath: Secondary | ICD-10-CM | POA: Diagnosis not present

## 2020-05-06 DIAGNOSIS — R918 Other nonspecific abnormal finding of lung field: Secondary | ICD-10-CM | POA: Diagnosis not present

## 2020-05-06 DIAGNOSIS — J441 Chronic obstructive pulmonary disease with (acute) exacerbation: Secondary | ICD-10-CM | POA: Diagnosis not present

## 2020-05-06 MED ORDER — METHYLPREDNISOLONE ACETATE 80 MG/ML IJ SUSP
80.0000 mg | Freq: Once | INTRAMUSCULAR | Status: AC
  Administered 2020-05-06: 40 mg via INTRAMUSCULAR

## 2020-05-06 MED ORDER — DOXYCYCLINE HYCLATE 100 MG PO TABS: 100.0000 mg | ORAL_TABLET | Freq: Two times a day (BID) | ORAL | 0 refills | Status: DC

## 2020-05-06 NOTE — Progress Notes (Signed)
[facility] [place], [place] [address]  Internal MEDICINE  Office Visit Note  Patient Name: Joyce Rodriguez  [id number]  [id number]  Date of Service: 05/07/2020  Chief Complaint  Patient presents with  . Follow-up    Finished antibiotic but still having sinus problems  . COPD    HPI Pt is here for pulmonary follow up, was started on azithromycin and prednisone taper last week,s he feels 85% better, still have chest congestion and cough, denies any fever and chills.  CT CHEST 2020 1.  Severe emphysema. 2. Bandlike consolidations of the medial right middle lobe and lingula (series 3, image 21). These are unchanged compared to prior CT dated 05/03/2012 and consistent with sequelae of prior atypical infection, particularly atypical mycobacterium. 3.  Coronary artery disease and aortic atherosclerosis.  Current Medication: Outpatient Encounter Medications as of 05/06/2020  Medication Sig Note  . doxycycline (VIBRA-TABS) 100 MG tablet Take 1 tablet (100 mg total) by mouth 2 (two) times daily. With food   . acetaminophen (TYLENOL) 500 MG tablet Take 500 mg by mouth every 6 (six) hours as needed.   Marland Kitchen albuterol (VENTOLIN HFA) 108 (90 Base) MCG/ACT inhaler INHALE 2 PUFFS BY MOUTH EVERY 6 HOURS AS NEEDED   . azithromycin (ZITHROMAX) 250 MG tablet Take one tab a day for 14 days for COPD flare up   . benzonatate (TESSALON) 100 MG capsule Take 1 capsule (100 mg total) by mouth 2 (two) times daily as needed for cough.   . cholecalciferol (VITAMIN D) 1000 units tablet Take 1,000 Units by mouth daily.   Marland Kitchen escitalopram (LEXAPRO) 10 MG tablet Take 1 tablet (10 mg total) by mouth daily.   Marland Kitchen ezetimibe (ZETIA) 10 MG tablet Take 10 mg by mouth daily.   . Fluticasone-Salmeterol (ADVAIR DISKUS) 250-50 MCG/DOSE AEPB INHALE 1 PUFF INTO THE LUNGS TWICE DAILY   . Multiple Vitamin (MULTIVITAMIN WITH MINERALS) TABS tablet Take 1 tablet by mouth daily.   Marland Kitchen omeprazole (PRILOSEC) 20 MG  capsule Take 1 capsule (20 mg total) by mouth daily.   . OXYGEN Inhale 2 L into the lungs.  11/16/2018: 3l at night, 2 or 3/ for portable   . potassium chloride (K-DUR) 10 MEQ tablet Take 1 tablet (10 mEq total) by mouth daily.   . predniSONE (STERAPRED UNI-PAK 21 TAB) 10 MG (21) TBPK tablet Use as directed for 6 days   . SPIRIVA HANDIHALER 18 MCG inhalation capsule PLACE 1 CAPSULE INTO INHALER AND INHALE DAILY   . traMADol (ULTRAM) 50 MG tablet TK 1 T PO Q 6 H PRN P 05/02/2019: As needed  . [EXPIRED] methylPREDNISolone acetate (DEPO-MEDROL) injection 80 mg     No facility-administered encounter medications on file as of 05/06/2020.    Surgical History: Past Surgical History:  Procedure Laterality Date  . ABDOMINAL HYSTERECTOMY    . CHOLECYSTECTOMY    . ESOPHAGEAL MANOMETRY N/A 09/13/2017   Procedure: ESOPHAGEAL MANOMETRY (EM);  Surgeon: Mauri Pole, MD;  Location: [facility];  Service: Endoscopy;  Laterality: N/A;  . ESOPHAGOGASTRODUODENOSCOPY (EGD) WITH PROPOFOL N/A 05/11/2017   Procedure: ESOPHAGOGASTRODUODENOSCOPY (EGD) WITH PROPOFOL;  Surgeon: Lin Landsman, MD;  Location: [facility] ENDOSCOPY;  Service: Gastroenterology;  Laterality: N/A;  . FOOT SURGERY    . LAPAROSCOPIC NISSEN FUNDOPLICATION N/A [id number]   Procedure: LAPAROSCOPIC NISSEN FUNDOPLICATION;  Surgeon: Jules Husbands, MD;  Location: [facility] ORS;  Service: General;  Laterality: N/A;  . NISSEN FUNDOPLICATION N/A [id number]   Procedure: NISSEN FUNDOPLICATION;  Surgeon: Jules Husbands, MD;  Location: [facility] ORS;  Service: General;  Laterality: N/A;    Medical History: Past Medical History:  Diagnosis Date  . Arthritis   . Complication of anesthesia    difficulty waking up  . COPD (chronic obstructive pulmonary disease) ([place])   . Dyspnea   . Emphysema of lung ([place])   . GERD (gastroesophageal reflux disease)   . Hyperlipidemia   . Hypertension   . Osteoporosis   . Oxygen deficiency    uses O2 at night    Family  History: Family History  Problem Relation Age of Onset  . Ovarian cancer Mother   . Emphysema Father   . Melanoma Sister   . Lung cancer Brother     Social History   Socioeconomic History  . Marital status: Married    Spouse name: Not on file  . Number of children: Not on file  . Years of education: 75  . Highest education level: 12th grade  Occupational History  . Occupation: retired  Tobacco Use  . Smoking status: Former Smoker    Types: Cigarettes    Quit date: 03/18/2001    Years since quitting: 19.1  . Smokeless tobacco: Never Used  Vaping Use  . Vaping Use: Never used  Substance and Sexual Activity  . Alcohol use: Yes    Comment: rare  . Drug use: No  . Sexual activity: Not Currently  Other Topics Concern  . Not on file  Social History Narrative  . Not on file   Social Determinants of Health   Financial Resource Strain: Not on file  Food Insecurity: Not on file  Transportation Needs: Not on file  Physical Activity: Not on file  Stress: Not on file  Social Connections: Not on file  Intimate Partner Violence: Not on file      Review of Systems  Constitutional: Negative for fatigue and fever.  HENT: Negative for congestion, mouth sores and postnasal drip.   Respiratory: Positive for cough and shortness of breath.   Cardiovascular: Negative for chest pain.  Genitourinary: Negative for flank pain.  Psychiatric/Behavioral: Negative.     Vital Signs: BP (!) 169/86   Pulse 96   Temp 97.6 F (36.4 C)   Resp 16   Ht 5\' 2"  (1.575 m)   Wt 140 lb (63.5 kg)   SpO2 99%   BMI 25.61 kg/m    Physical Exam Constitutional:      Appearance: Normal appearance.  HENT:     Head: Normocephalic and atraumatic.  Eyes:     Extraocular Movements: Extraocular movements intact.  Cardiovascular:     Rate and Rhythm: Normal rate and regular rhythm.  Pulmonary:     Breath sounds: Wheezing present.     Comments: Congested with decreased bs  Neurological:      Mental Status: She is alert.  Psychiatric:        Mood and Affect: Mood normal.     Assessment/Plan: 1. COPD, frequent exacerbations ([place]) Finished Azithromycin, CT chest reviewed from 2020, has a component of atypical infection, will start doxycycline until seen by pulmonary  - Spirometry with Graph. End stage lung disease  - methylPREDNISolone acetate (DEPO-MEDROL) injection 80 mg  2. Abnormal CT scan, lung Discussed CT scan report, will need to see pulmonary   General Counseling: Delrae Sawyers understanding of the findings of todays visit and agrees with plan of treatment. I have discussed any further diagnostic evaluation that may be needed or ordered today.  We also reviewed her medications today. she has been encouraged to call the office with any questions or concerns that should arise related to todays visit.    Orders Placed This Encounter  Procedures  . Spirometry with Graph    Meds ordered this encounter  Medications  . doxycycline (VIBRA-TABS) 100 MG tablet    Sig: Take 1 tablet (100 mg total) by mouth 2 (two) times daily. With food    Dispense:  20 tablet    Refill:  0  . methylPREDNISolone acetate (DEPO-MEDROL) injection 80 mg    Total time spent:30 Minutes Time spent includes review of chart, medications, test results, and follow up plan with the patient.      Dr Lavera Guise Internal medicine

## 2020-05-08 DIAGNOSIS — T466X5A Adverse effect of antihyperlipidemic and antiarteriosclerotic drugs, initial encounter: Secondary | ICD-10-CM | POA: Diagnosis not present

## 2020-05-08 DIAGNOSIS — Z0000 Encounter for general adult medical examination without abnormal findings: Secondary | ICD-10-CM | POA: Diagnosis not present

## 2020-05-08 DIAGNOSIS — Z1211 Encounter for screening for malignant neoplasm of colon: Secondary | ICD-10-CM | POA: Diagnosis not present

## 2020-05-08 DIAGNOSIS — E782 Mixed hyperlipidemia: Secondary | ICD-10-CM | POA: Diagnosis not present

## 2020-05-08 DIAGNOSIS — G720 Drug-induced myopathy: Secondary | ICD-10-CM | POA: Diagnosis not present

## 2020-05-08 DIAGNOSIS — I10 Essential (primary) hypertension: Secondary | ICD-10-CM | POA: Diagnosis not present

## 2020-05-08 DIAGNOSIS — Z1231 Encounter for screening mammogram for malignant neoplasm of breast: Secondary | ICD-10-CM | POA: Diagnosis not present

## 2020-05-08 DIAGNOSIS — J439 Emphysema, unspecified: Secondary | ICD-10-CM | POA: Diagnosis not present

## 2020-05-08 DIAGNOSIS — J431 Panlobular emphysema: Secondary | ICD-10-CM | POA: Diagnosis not present

## 2020-05-08 DIAGNOSIS — F3341 Major depressive disorder, recurrent, in partial remission: Secondary | ICD-10-CM | POA: Diagnosis not present

## 2020-05-08 DIAGNOSIS — J449 Chronic obstructive pulmonary disease, unspecified: Secondary | ICD-10-CM | POA: Diagnosis not present

## 2020-05-08 DIAGNOSIS — J9811 Atelectasis: Secondary | ICD-10-CM | POA: Diagnosis not present

## 2020-05-17 ENCOUNTER — Telehealth: Payer: Self-pay

## 2020-05-17 NOTE — Telephone Encounter (Signed)
Rescheduled appt due to pt being exposed to covid

## 2020-05-20 ENCOUNTER — Ambulatory Visit: Payer: PPO | Admitting: Internal Medicine

## 2020-05-22 DIAGNOSIS — Z1211 Encounter for screening for malignant neoplasm of colon: Secondary | ICD-10-CM | POA: Diagnosis not present

## 2020-06-06 ENCOUNTER — Ambulatory Visit: Payer: PPO | Admitting: Hospice and Palliative Medicine

## 2020-06-06 ENCOUNTER — Encounter: Payer: Self-pay | Admitting: Internal Medicine

## 2020-06-06 ENCOUNTER — Other Ambulatory Visit: Payer: Self-pay

## 2020-06-06 VITALS — BP 120/70 | HR 97 | Temp 97.0°F | Resp 16 | Ht 62.0 in | Wt 136.4 lb

## 2020-06-06 DIAGNOSIS — J441 Chronic obstructive pulmonary disease with (acute) exacerbation: Secondary | ICD-10-CM | POA: Diagnosis not present

## 2020-06-06 DIAGNOSIS — Z9981 Dependence on supplemental oxygen: Secondary | ICD-10-CM

## 2020-06-06 DIAGNOSIS — R9389 Abnormal findings on diagnostic imaging of other specified body structures: Secondary | ICD-10-CM | POA: Diagnosis not present

## 2020-06-06 DIAGNOSIS — J9611 Chronic respiratory failure with hypoxia: Secondary | ICD-10-CM | POA: Diagnosis not present

## 2020-06-06 NOTE — Progress Notes (Signed)
[facility] [place], [place] [address]  Pulmonary Sleep Medicine   Office Visit Note  Patient Name: Joyce Rodriguez DOB: November 28, 1945 MRN [id number]  Date of Service: 06/09/2020  Complaints/HPI: Patient is here for routine pulmonary follow-up Last visit, treated for COPD exacerbation with doxycycline as well as IM depo-had previously completed azithromycin Today, she is feeling much better, breathing has returned to baseline Reviewed CT chest from 2020 Bandlike consolidations of medial right middle lode--unchanged from findings previously scanned in 2014--needs repeat scanning for surveillance She is also wanting to discuss surgical clearance for left shoulder surgery--not scheduled yet but hoping to have surgery within the next few months Continues to use 2-3LPM via [place] supplemental oxygen at all times, using inhaler therapy as prescribed PFT in September 2021--severe obstructive lung disease with severely decreased DLCO  ROS  General: (-) fever, (-) chills, (-) night sweats, (-) weakness Skin: (-) rashes, (-) itching,. Eyes: (-) visual changes, (-) redness, (-) itching. Nose and Sinuses: (-) nasal stuffiness or itchiness, (-) postnasal drip, (-) nosebleeds, (-) sinus trouble. Mouth and Throat: (-) sore throat, (-) hoarseness. Neck: (-) swollen glands, (-) enlarged thyroid, (-) neck pain. Respiratory: - cough, (-) bloody sputum, + shortness of breath, - wheezing. Cardiovascular: - ankle swelling, (-) chest pain. Lymphatic: (-) lymph node enlargement. Neurologic: (-) numbness, (-) tingling. Psychiatric: (-) anxiety, (-) depression   Current Medication: Outpatient Encounter Medications as of 06/06/2020  Medication Sig Note  . acetaminophen (TYLENOL) 500 MG tablet Take 500 mg by mouth every 6 (six) hours as needed.   Marland Kitchen albuterol (VENTOLIN HFA) 108 (90 Base) MCG/ACT inhaler INHALE 2 PUFFS BY MOUTH EVERY 6 HOURS AS NEEDED   . cholecalciferol (VITAMIN D) 1000  units tablet Take 1,000 Units by mouth daily.   Marland Kitchen escitalopram (LEXAPRO) 10 MG tablet Take 1 tablet (10 mg total) by mouth daily.   Marland Kitchen ezetimibe (ZETIA) 10 MG tablet Take 10 mg by mouth daily.   . Fluticasone-Salmeterol (ADVAIR DISKUS) 250-50 MCG/DOSE AEPB INHALE 1 PUFF INTO THE LUNGS TWICE DAILY   . Multiple Vitamin (MULTIVITAMIN WITH MINERALS) TABS tablet Take 1 tablet by mouth daily.   Marland Kitchen omeprazole (PRILOSEC) 20 MG capsule Take 1 capsule (20 mg total) by mouth daily.   . OXYGEN Inhale 2 L into the lungs.  11/16/2018: 3l at night, 2 or 3/ for portable   . potassium chloride (K-DUR) 10 MEQ tablet Take 1 tablet (10 mEq total) by mouth daily.   . predniSONE (STERAPRED UNI-PAK 21 TAB) 10 MG (21) TBPK tablet Use as directed for 6 days   . SPIRIVA HANDIHALER 18 MCG inhalation capsule PLACE 1 CAPSULE INTO INHALER AND INHALE DAILY   . traMADol (ULTRAM) 50 MG tablet TK 1 T PO Q 6 H PRN P 05/02/2019: As needed  . [DISCONTINUED] azithromycin (ZITHROMAX) 250 MG tablet Take one tab a day for 14 days for COPD flare up   . [DISCONTINUED] benzonatate (TESSALON) 100 MG capsule Take 1 capsule (100 mg total) by mouth 2 (two) times daily as needed for cough.   . [DISCONTINUED] doxycycline (VIBRA-TABS) 100 MG tablet Take 1 tablet (100 mg total) by mouth 2 (two) times daily. With food    No facility-administered encounter medications on file as of 06/06/2020.    Surgical History: Past Surgical History:  Procedure Laterality Date  . ABDOMINAL HYSTERECTOMY    . CHOLECYSTECTOMY    . ESOPHAGEAL MANOMETRY N/A 09/13/2017   Procedure: ESOPHAGEAL MANOMETRY (EM);  Surgeon: Mauri Pole,  MD;  Location: [facility];  Service: Endoscopy;  Laterality: N/A;  . ESOPHAGOGASTRODUODENOSCOPY (EGD) WITH PROPOFOL N/A 05/11/2017   Procedure: ESOPHAGOGASTRODUODENOSCOPY (EGD) WITH PROPOFOL;  Surgeon: Lin Landsman, MD;  Location: [facility] ENDOSCOPY;  Service: Gastroenterology;  Laterality: N/A;  . FOOT SURGERY    . LAPAROSCOPIC  NISSEN FUNDOPLICATION N/A 0/05/7251   Procedure: LAPAROSCOPIC NISSEN FUNDOPLICATION;  Surgeon: Jules Husbands, MD;  Location: [facility] ORS;  Service: General;  Laterality: N/A;  . NISSEN FUNDOPLICATION N/A 09/16/4401   Procedure: NISSEN FUNDOPLICATION;  Surgeon: Jules Husbands, MD;  Location: [facility] ORS;  Service: General;  Laterality: N/A;    Medical History: Past Medical History:  Diagnosis Date  . Arthritis   . Complication of anesthesia    difficulty waking up  . COPD (chronic obstructive pulmonary disease) ([place])   . Dyspnea   . Emphysema of lung ([place])   . GERD (gastroesophageal reflux disease)   . Hyperlipidemia   . Hypertension   . Osteoporosis   . Oxygen deficiency    uses O2 at night    Family History: Family History  Problem Relation Age of Onset  . Ovarian cancer Mother   . Emphysema Father   . Melanoma Sister   . Lung cancer Brother     Social History: Social History   Socioeconomic History  . Marital status: Married    Spouse name: Not on file  . Number of children: Not on file  . Years of education: 91  . Highest education level: 12th grade  Occupational History  . Occupation: retired  Tobacco Use  . Smoking status: Former Smoker    Types: Cigarettes    Quit date: 03/18/2001    Years since quitting: 19.2  . Smokeless tobacco: Never Used  Vaping Use  . Vaping Use: Never used  Substance and Sexual Activity  . Alcohol use: Yes    Comment: rare  . Drug use: No  . Sexual activity: Not Currently  Other Topics Concern  . Not on file  Social History Narrative  . Not on file   Social Determinants of Health   Financial Resource Strain: Not on file  Food Insecurity: Not on file  Transportation Needs: Not on file  Physical Activity: Not on file  Stress: Not on file  Social Connections: Not on file  Intimate Partner Violence: Not on file    Vital Signs: Blood pressure 120/70, pulse 97, temperature (!) 97 F (36.1 C), resp. rate 16, height 5\' 2"  (1.575  m), weight 136 lb 6.4 oz (61.9 kg), SpO2 97 %.  Examination: General Appearance: The patient is well-developed, well-nourished, and in no distress. Skin: Gross inspection of skin unremarkable. Head: normocephalic, no gross deformities. Eyes: no gross deformities noted. ENT: ears appear grossly normal no exudates. Neck: Supple. No thyromegaly. No LAD. Respiratory: Diminished throughout, no rhonchi, wheezing or rales noted. Cardiovascular: Normal S1 and S2 without murmur or rub. Extremities: No cyanosis. pulses are equal. Neurologic: Alert and oriented. No involuntary movements.  LABS: No results found for this or any previous visit (from the past 2160 hour(s)).  Radiology: NM Myocar Multi W/Spect W/Wall Motion / EF  Result Date: 07/03/2019  Blood pressure demonstrated a normal response to exercise.  There was no ST segment deviation noted during stress.  The study is normal.  This is a low risk study.  The left ventricular ejection fraction is normal (55-65%).     No results found.  No results found.    Assessment and Plan: Patient  Active Problem List   Diagnosis Date Noted  . Obstructive chronic bronchitis with exacerbation ([place]) 01/30/2019  . Oxygen dependent 01/30/2019  . S/P Nissen fundoplication (with gastrostomy tube placement) ([place]) 10/19/2017  . CAD (coronary artery disease), native coronary artery 10/18/2017  . Hiatal hernia 10/15/2017  . Abnormal EKG 10/15/2017  . Dysphagia   . Schatzki's ring   . Gastric erosion   . Advanced care planning/counseling discussion 04/21/2017  . Acute on chronic respiratory failure with hypoxia ([place]) 05/08/2016  . COPD exacerbation ([place]) 05/08/2016  . Hyperglycemia 05/08/2016  . Dyspnea 05/06/2016  . Encounter for hepatitis C screening test for low risk patient 03/19/2016  . Essential hypertension 03/19/2015  . COPD (chronic obstructive pulmonary disease) ([place]) 03/19/2015  . Hyperlipidemia 03/19/2015  . Osteoarthritis of  both hands 03/19/2015  . Osteoporosis 03/19/2015  . Acid reflux 03/19/2015  . Major depression, chronic 09/12/2014    1. COPD, frequent exacerbations ([place]) Consider optimizing therapy with adding Daliresp//will review cardiac history, may benefit from NIV Will look into these options after reviewing CT results  2. Abnormal chest CT Abnormalities noted in 2020, update for surveillance - CT Chest Wo Contrast; Future  3. Chronic respiratory failure with hypoxia ([facility]) Continue with supportive measures, will need to optimize therapy  4. Oxygen dependent Continue with current settings  General Counseling: I have discussed the findings of the evaluation and examination with Dorian Pod.  I have also discussed any further diagnostic evaluation thatmay be needed or ordered today. Ritamarie verbalizes understanding of the findings of todays visit. We also reviewed her medications today and discussed drug interactions and side effects including but not limited excessive drowsiness and altered mental states. We also discussed that there is always a risk not just to her but also people around her. she has been encouraged to call the office with any questions or concerns that should arise related to todays visit.  Orders Placed This Encounter  Procedures  . CT Chest Wo Contrast    Standing Status:   Future    Standing Expiration Date:   06/06/2021    Order Specific Question:   Preferred imaging location?    Answer:   [facility]     Time spent: 37  I have personally obtained a history, examined the patient, evaluated laboratory and imaging results, formulated the assessment and plan and placed orders. This patient was seen by Theodoro Grist AGNP-C in Collaboration with Dr. Devona Konig as a part of collaborative care agreement.    Allyne Gee, MD [facility] Pulmonary and Critical Care Sleep medicine

## 2020-06-09 ENCOUNTER — Encounter: Payer: Self-pay | Admitting: Hospice and Palliative Medicine

## 2020-06-12 ENCOUNTER — Other Ambulatory Visit: Payer: Self-pay | Admitting: Adult Health

## 2020-06-13 ENCOUNTER — Other Ambulatory Visit: Payer: Self-pay

## 2020-06-13 MED ORDER — ALBUTEROL SULFATE HFA 108 (90 BASE) MCG/ACT IN AERS: 2.0000 | INHALATION_SPRAY | Freq: Four times a day (QID) | RESPIRATORY_TRACT | 3 refills | Status: DC | PRN

## 2020-06-20 DIAGNOSIS — Z1231 Encounter for screening mammogram for malignant neoplasm of breast: Secondary | ICD-10-CM | POA: Diagnosis not present

## 2020-06-21 ENCOUNTER — Ambulatory Visit
Admission: RE | Admit: 2020-06-21 | Discharge: 2020-06-21 | Disposition: A | Discharge status: Home / Self Care | Payer: PPO | Source: Ambulatory Visit | Attending: Hospice and Palliative Medicine | Admitting: Hospice and Palliative Medicine

## 2020-06-21 ENCOUNTER — Other Ambulatory Visit: Payer: Self-pay

## 2020-06-21 DIAGNOSIS — R0602 Shortness of breath: Secondary | ICD-10-CM | POA: Diagnosis not present

## 2020-06-21 DIAGNOSIS — R9389 Abnormal findings on diagnostic imaging of other specified body structures: Secondary | ICD-10-CM | POA: Diagnosis not present

## 2020-07-18 ENCOUNTER — Ambulatory Visit: Payer: PPO | Admitting: Internal Medicine

## 2020-07-19 ENCOUNTER — Other Ambulatory Visit: Payer: Self-pay | Admitting: Internal Medicine

## 2020-07-19 DIAGNOSIS — J449 Chronic obstructive pulmonary disease, unspecified: Secondary | ICD-10-CM

## 2020-07-23 ENCOUNTER — Other Ambulatory Visit: Payer: Self-pay | Admitting: Internal Medicine

## 2020-07-23 DIAGNOSIS — J441 Chronic obstructive pulmonary disease with (acute) exacerbation: Secondary | ICD-10-CM

## 2020-07-23 DIAGNOSIS — R0602 Shortness of breath: Secondary | ICD-10-CM

## 2020-08-01 ENCOUNTER — Other Ambulatory Visit: Payer: Self-pay

## 2020-08-01 ENCOUNTER — Encounter: Payer: Self-pay | Admitting: Internal Medicine

## 2020-08-01 ENCOUNTER — Ambulatory Visit: Payer: PPO | Admitting: Internal Medicine

## 2020-08-01 VITALS — BP 118/84 | HR 111 | Temp 98.2°F | Resp 16 | Ht 62.0 in | Wt 137.0 lb

## 2020-08-01 DIAGNOSIS — Z9981 Dependence on supplemental oxygen: Secondary | ICD-10-CM

## 2020-08-01 DIAGNOSIS — M19012 Primary osteoarthritis, left shoulder: Secondary | ICD-10-CM

## 2020-08-01 DIAGNOSIS — R0602 Shortness of breath: Secondary | ICD-10-CM

## 2020-08-01 DIAGNOSIS — J449 Chronic obstructive pulmonary disease, unspecified: Secondary | ICD-10-CM | POA: Diagnosis not present

## 2020-08-01 NOTE — Patient Instructions (Signed)
Chronic Obstructive Pulmonary Disease  Chronic obstructive pulmonary disease (COPD) is a long-term (chronic) lung problem. When you have COPD, it is hard for air to get in and out of your lungs. Usually the condition gets worse over time, and your lungs will never return to normal. There are things you can do to keep yourself as healthy as possible. What are the causes?  Smoking. This is the most common cause.  Certain genes passed from parent to child (inherited). What increases the risk?  Being exposed to secondhand smoke from cigarettes, pipes, or cigars.  Being exposed to chemicals and other irritants, such as fumes and dust in the work environment.  Having chronic lung conditions or infections. What are the signs or symptoms?  Shortness of breath, especially during physical activity.  A long-term cough with a large amount of thick mucus. Sometimes, the cough may not have any mucus (dry cough).  Wheezing.  Breathing quickly.  Skin that looks gray or blue, especially in the fingers, toes, or lips.  Feeling tired (fatigue).  Weight loss.  Chest tightness.  Having infections often.  Episodes when breathing symptoms become much worse (exacerbations). At the later stages of this disease, you may have swelling in the ankles, feet, or legs. How is this treated?  Taking medicines.  Quitting smoking, if you smoke.  Rehabilitation. This includes steps to make your body work better. It may involve a team of specialists.  Doing exercises.  Making changes to your diet.  Using oxygen.  Lung surgery.  Lung transplant.  Comfort measures (palliative care). Follow these instructions at home: Medicines  Take over-the-counter and prescription medicines only as told by your doctor.  Talk to your doctor before taking any cough or allergy medicines. You may need to avoid medicines that cause your lungs to be dry. Lifestyle  If you smoke, stop smoking. Smoking makes the  problem worse.  Do not smoke or use any products that contain nicotine or tobacco. If you need help quitting, ask your doctor.  Avoid being around things that make your breathing worse. This may include smoke, chemicals, and fumes.  Stay active, but remember to rest as well.  Learn and use tips on how to manage stress and control your breathing.  Make sure you get enough sleep. Most adults need at least 7 hours of sleep every night.  Eat healthy foods. Eat smaller meals more often. Rest before meals. Controlled breathing Learn and use tips on how to control your breathing as told by your doctor. Try:  Breathing in (inhaling) through your nose for 1 second. Then, pucker your lips and breath out (exhale) through your lips for 2 seconds.  Putting one hand on your belly (abdomen). Breathe in slowly through your nose for 1 second. Your hand on your belly should move out. Pucker your lips and breathe out slowly through your lips. Your hand on your belly should move in as you breathe out.   Controlled coughing Learn and use controlled coughing to clear mucus from your lungs. Follow these steps: 1. Lean your head a little forward. 2. Breathe in deeply. 3. Try to hold your breath for 3 seconds. 4. Keep your mouth slightly open while coughing 2 times. 5. Spit any mucus out into a tissue. 6. Rest and do the steps again 1 or 2 times as needed. General instructions  Make sure you get all the shots (vaccines) that your doctor recommends. Ask your doctor about a flu shot and a pneumonia shot.    Use oxygen therapy and pulmonary rehabilitation if told by your doctor. If you need home oxygen therapy, ask your doctor if you should buy a tool to measure your oxygen level (oximeter).  Make a COPD action plan with your doctor. This helps you to know what to do if you feel worse than usual.  Manage any other conditions you have as told by your doctor.  Avoid going outside when it is very hot, cold, or  humid.  Avoid people who have a sickness you can catch (contagious).  Keep all follow-up visits. Contact a doctor if:  You cough up more mucus than usual.  There is a change in the color or thickness of the mucus.  It is harder to breathe than usual.  Your breathing is faster than usual.  You have trouble sleeping.  You need to use your medicines more often than usual.  You have trouble doing your normal activities such as getting dressed or walking around the house. Get help right away if:  You have shortness of breath while resting.  You have shortness of breath that stops you from: ? Being able to talk. ? Doing normal activities.  Your chest hurts for longer than 5 minutes.  Your skin color is more blue than usual.  Your pulse oximeter shows that you have low oxygen for longer than 5 minutes.  You have a fever.  You feel too tired to breathe normally. These symptoms may represent a serious problem that is an emergency. Do not wait to see if the symptoms will go away. Get medical help right away. Call your local emergency services (911 in the [place] Do not drive yourself to the hospital. Summary  Chronic obstructive pulmonary disease (COPD) is a long-term lung problem.  The way your lungs work will never return to normal. Usually the condition gets worse over time. There are things you can do to keep yourself as healthy as possible.  Take over-the-counter and prescription medicines only as told by your doctor.  If you smoke, stop. Smoking makes the problem worse. This information is not intended to replace advice given to you by your health care provider. Make sure you discuss any questions you have with your health care provider. Document Revised: 02/06/2020 Document Reviewed: 02/06/2020 Elsevier Patient Education  2021 [employer]   

## 2020-08-01 NOTE — Progress Notes (Signed)
[facility] [place], [place] [address]  Pulmonary Sleep Medicine   Office Visit Note  Patient Name: Joyce Rodriguez DOB: [birth year] MRN [id number]  Date of Service: 08/01/2020  Complaints/HPI: She is here for her routine follow-up COPD.  She states that she is doing better.  She continues to use her oxygen.  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  I had a discussion with her regarding the risks and the benefits and she does understand that there is significant increased risk of pulmonary complications postoperatively based on the severity of her lung disease.  She does understand this and she is still going to be willing to do the procedure simply because of a debilitation that she has been experiencing with the pain.  ROS  General: (-) fever, (-) chills, (-) night sweats, (-) weakness Skin: (-) rashes, (-) itching,. Eyes: (-) visual changes, (-) redness, (-) itching. Nose and Sinuses: (-) nasal stuffiness or itchiness, (-) postnasal drip, (-) nosebleeds, (-) sinus trouble. Mouth and Throat: (-) sore throat, (-) hoarseness. Neck: (-) swollen glands, (-) enlarged thyroid, (-) neck pain. Respiratory: + cough, (-) bloody sputum, + shortness of breath, - wheezing. Cardiovascular: - ankle swelling, (-) chest pain. Lymphatic: (-) lymph node enlargement. Neurologic: (-) numbness, (-) tingling. Psychiatric: (-) anxiety, (-) depression   Current Medication: Outpatient Encounter Medications as of 08/01/2020  Medication Sig Note  . acetaminophen (TYLENOL) 500 MG tablet Take 500 mg by mouth every 6 (six) hours as needed.   Marland Kitchen albuterol (VENTOLIN HFA) 108 (90 Base) MCG/ACT inhaler Inhale 2 puffs into the lungs every 6 (six) hours as needed.   . cholecalciferol (VITAMIN D) 1000 units tablet Take  1,000 Units by mouth daily.   Marland Kitchen escitalopram (LEXAPRO) 10 MG tablet Take 1 tablet (10 mg total) by mouth daily.   Marland Kitchen ezetimibe (ZETIA) 10 MG tablet Take 10 mg by mouth daily.   . Multiple Vitamin (MULTIVITAMIN WITH MINERALS) TABS tablet Take 1 tablet by mouth daily.   Marland Kitchen omeprazole (PRILOSEC) 20 MG capsule TAKE ONE CAPSULE BY MOUTH DAILY   . OXYGEN Inhale 2 L into the lungs.  11/16/2018: 3l at night, 2 or 3/ for portable   . potassium chloride (K-DUR) 10 MEQ tablet Take 1 tablet (10 mEq total) by mouth daily.   Marland Kitchen SPIRIVA HANDIHALER 18 MCG inhalation capsule PLACE 1 CAPSULE INTO INHALER AND INHALE DAILY   . traMADol (ULTRAM) 50 MG tablet TK 1 T PO Q 6 H PRN P 05/02/2019: As needed  . WIXELA INHUB 250-50 MCG/DOSE AEPB INHALE 1 PUFF INTO THE LUNGS TWICE DAILY   . [DISCONTINUED] predniSONE (STERAPRED UNI-PAK 21 TAB) 10 MG (21) TBPK tablet Use as directed for 6 days (Patient not taking: Reported on 08/01/2020)    No facility-administered encounter medications on file as of 08/01/2020.    Surgical History: Past Surgical History:  Procedure Laterality Date  . ABDOMINAL HYSTERECTOMY    . CHOLECYSTECTOMY    . ESOPHAGEAL MANOMETRY N/A 09/13/2017   Procedure: ESOPHAGEAL MANOMETRY (EM);  Surgeon: Mauri Pole, MD;  Location: [facility] ENDOSCOPY;  Service: Endoscopy;  Laterality: N/A;  . ESOPHAGOGASTRODUODENOSCOPY (EGD) WITH PROPOFOL N/A 05/11/2017   Procedure: ESOPHAGOGASTRODUODENOSCOPY (EGD) WITH PROPOFOL;  Surgeon: Lin Landsman, MD;  Location: [facility] ENDOSCOPY;  Service: Gastroenterology;  Laterality: N/A;  .  FOOT SURGERY    . LAPAROSCOPIC NISSEN FUNDOPLICATION N/A 04/14/4578   Procedure: LAPAROSCOPIC NISSEN FUNDOPLICATION;  Surgeon: Jules Husbands, MD;  Location: [facility] ORS;  Service: General;  Laterality: N/A;  . NISSEN FUNDOPLICATION N/A 12/20/8336   Procedure: NISSEN FUNDOPLICATION;  Surgeon: Jules Husbands, MD;  Location: [facility] ORS;  Service: General;  Laterality: N/A;    Medical History: Past Medical  History:  Diagnosis Date  . Arthritis   . Complication of anesthesia    difficulty waking up  . COPD (chronic obstructive pulmonary disease) ([place])   . Dyspnea   . Emphysema of lung ([place])   . GERD (gastroesophageal reflux disease)   . Hyperlipidemia   . Hypertension   . Osteoporosis   . Oxygen deficiency    uses O2 at night    Family History: Family History  Problem Relation Age of Onset  . Ovarian cancer Mother   . Emphysema Father   . Melanoma Sister   . Lung cancer Brother     Social History: Social History   Socioeconomic History  . Marital status: Married    Spouse name: Not on file  . Number of children: Not on file  . Years of education: 64  . Highest education level: 12th grade  Occupational History  . Occupation: retired  Tobacco Use  . Smoking status: Former Smoker    Types: Cigarettes    Quit date: 03/18/2001    Years since quitting: 19.3  . Smokeless tobacco: Never Used  Vaping Use  . Vaping Use: Never used  Substance and Sexual Activity  . Alcohol use: Yes    Comment: rare  . Drug use: No  . Sexual activity: Not Currently  Other Topics Concern  . Not on file  Social History Narrative  . Not on file   Social Determinants of Health   Financial Resource Strain: Not on file  Food Insecurity: Not on file  Transportation Needs: Not on file  Physical Activity: Not on file  Stress: Not on file  Social Connections: Not on file  Intimate Partner Violence: Not on file    Vital Signs: Blood pressure 118/84, pulse (!) 111, temperature 98.2 F (36.8 C), resp. rate 16, height 5\' 2"  (1.575 m), weight 137 lb (62.1 kg), SpO2 95 %.  Examination: General Appearance: The patient is well-developed, well-nourished, and in no distress. Skin: Gross inspection of skin unremarkable. Head: normocephalic, no gross deformities. Eyes: no gross deformities noted. ENT: ears appear grossly normal no exudates. Neck: Supple. No thyromegaly. No LAD. Respiratory: few  rhonchi noted. Cardiovascular: Normal S1 and S2 without murmur or rub. Extremities: No cyanosis. pulses are equal. Neurologic: Alert and oriented. No involuntary movements.  LABS: No results found for this or any previous visit (from the past 2160 hour(s)).  Radiology: CT Chest Wo Contrast  Result Date: 06/24/2020 CLINICAL DATA:  75 year old female with history of atypical pneumonia. COPD with shortness of breath. On oxygen at night. Former smoker (quit 20 years ago). Follow-up study. EXAM: CT CHEST WITHOUT CONTRAST TECHNIQUE: Multidetector CT imaging of the chest was performed following the standard protocol without IV contrast. COMPARISON:  Chest CT 11/08/2018. FINDINGS: Cardiovascular: Heart size is normal. There is no significant pericardial fluid, thickening or pericardial calcification. There is aortic atherosclerosis, as well as atherosclerosis of the great vessels of the mediastinum and the coronary arteries, including calcified atherosclerotic plaque in the left main, left anterior descending, left circumflex and right coronary arteries. Mediastinum/Nodes: No pathologically enlarged mediastinal or hilar lymph  nodes. Please note that accurate exclusion of hilar adenopathy is limited on noncontrast CT scans. Small hiatal hernia. No axillary lymphadenopathy. Lungs/Pleura: No acute consolidative airspace disease. No pleural effusions. No definite suspicious appearing pulmonary nodules or masses are noted. Diffuse bronchial wall thickening with severe centrilobular and paraseptal emphysema. Chronic areas of architectural distortion and volume loss are noted in the inferior segment of the lingula and the right middle lobe, most compatible with areas of chronic post infectious or inflammatory scarring, very similar to remote prior study from 11/08/2018. Upper Abdomen: Aortic atherosclerosis.  Status post cholecystectomy. Musculoskeletal: There are no aggressive appearing lytic or blastic lesions noted  in the visualized portions of the skeleton. IMPRESSION: 1. No acute findings are noted on today's examination. 2. Chronic post infectious or inflammatory scarring in the right middle lobe and inferior segment of the lingula. 3. Diffuse bronchial wall thickening with severe centrilobular and paraseptal emphysema; imaging findings compatible with clinical history of underlying COPD. 4. Aortic atherosclerosis, in addition to left main and 3 vessel coronary artery disease. Assessment for potential risk factor modification, dietary therapy or pharmacologic therapy may be warranted, if clinically indicated. Aortic Atherosclerosis (ICD10-I70.0) and Emphysema (ICD10-J43.9). Electronically Signed   By: Vinnie Langton M.D.   On: 06/24/2020 08:20    No results found.  No results found.    Assessment and Plan: Patient Active Problem List   Diagnosis Date Noted  . Obstructive chronic bronchitis with exacerbation ([place]) 01/30/2019  . Oxygen dependent 01/30/2019  . S/P Nissen fundoplication (with gastrostomy tube placement) ([place]) 10/19/2017  . CAD (coronary artery disease), native coronary artery 10/18/2017  . Hiatal hernia 10/15/2017  . Abnormal EKG 10/15/2017  . Dysphagia   . Schatzki's ring   . Gastric erosion   . Advanced care planning/counseling discussion 04/21/2017  . Acute on chronic respiratory failure with hypoxia ([place]) 05/08/2016  . COPD exacerbation ([place]) 05/08/2016  . Hyperglycemia 05/08/2016  . Dyspnea 05/06/2016  . Encounter for hepatitis C screening test for low risk patient 03/19/2016  . Essential hypertension 03/19/2015  . COPD (chronic obstructive pulmonary disease) ([place]) 03/19/2015  . Hyperlipidemia 03/19/2015  . Osteoarthritis of both hands 03/19/2015  . Osteoporosis 03/19/2015  . Acid reflux 03/19/2015  . Major depression, chronic 09/12/2014    1. SOB (shortness of breath) Severe pulmonary disease with FEV1 of 0.7L the patient's overall prognosis is guarded.  She has done  well with exacerbations that have responded nicely to antibiotics. - Spirometry with Graph  2. Oxygen dependent On her oxygen therapy which will be continued we will continue with current oxygen level  3. Chronic obstructive pulmonary disease, unspecified COPD type ([place]) On inhalers which should be continued severe pulmonary disease based on PFTs  4. Osteoarthritis of left shoulder, unspecified osteoarthritis type Severe degenerative arthritis requiring surgery she will be at high risk for pulmonary complications postoperatively and intraoperatively from anesthesia possible complications.  She understands and she states that she is willing to take the risk if it will help her with improvement of her pain.  General Counseling: I have discussed the findings of the evaluation and examination with Dorian Pod.  I have also discussed any further diagnostic evaluation thatmay be needed or ordered today. Erina verbalizes understanding of the findings of todays visit. We also reviewed her medications today and discussed drug interactions and side effects including but not limited excessive drowsiness and altered mental states. We also discussed that there is always a risk not just to her but also people around her.  she has been encouraged to call the office with any questions or concerns that should arise related to todays visit.  Orders Placed This Encounter  Procedures  . Spirometry with Graph    Order Specific Question:   Where should this test be performed?    Answer:   [facility]    Order Specific Question:   Basic spirometry    Answer:   Yes    Order Specific Question:   Spirometry pre & post bronchodilator    Answer:   No     Time spent: 33  I have personally obtained a history, examined the patient, evaluated laboratory and imaging results, formulated the assessment and plan and placed orders.    Allyne Gee, MD [facility] Sleep medicine

## 2020-08-23 ENCOUNTER — Other Ambulatory Visit: Payer: Self-pay | Admitting: Surgery

## 2020-08-23 DIAGNOSIS — M19012 Primary osteoarthritis, left shoulder: Secondary | ICD-10-CM | POA: Diagnosis not present

## 2020-08-23 DIAGNOSIS — M7582 Other shoulder lesions, left shoulder: Secondary | ICD-10-CM | POA: Diagnosis not present

## 2020-08-27 ENCOUNTER — Other Ambulatory Visit: Payer: Self-pay | Admitting: Surgery

## 2020-08-27 DIAGNOSIS — M7582 Other shoulder lesions, left shoulder: Secondary | ICD-10-CM

## 2020-08-27 DIAGNOSIS — M19012 Primary osteoarthritis, left shoulder: Secondary | ICD-10-CM

## 2020-09-02 ENCOUNTER — Other Ambulatory Visit: Payer: Self-pay

## 2020-09-02 ENCOUNTER — Encounter
Admission: RE | Admit: 2020-09-02 | Discharge: 2020-09-02 | Disposition: A | Discharge status: Home / Self Care | Payer: PPO | Source: Ambulatory Visit | Attending: Surgery | Admitting: Surgery

## 2020-09-02 DIAGNOSIS — Z01810 Encounter for preprocedural cardiovascular examination: Secondary | ICD-10-CM | POA: Diagnosis not present

## 2020-09-02 DIAGNOSIS — Z01818 Encounter for other preprocedural examination: Secondary | ICD-10-CM | POA: Diagnosis not present

## 2020-09-02 HISTORY — DX: Umbilical hernia without obstruction or gangrene: K42.9

## 2020-09-02 HISTORY — DX: Headache, unspecified: R51.9

## 2020-09-02 HISTORY — DX: Atherosclerosis of aorta: I70.0

## 2020-09-02 HISTORY — DX: Atherosclerotic heart disease of native coronary artery without angina pectoris: I25.10

## 2020-09-02 HISTORY — DX: Personal history of other diseases of the digestive system: Z87.19

## 2020-09-02 HISTORY — DX: Pneumonia, unspecified organism: J18.9

## 2020-09-02 HISTORY — DX: Dependence on supplemental oxygen: Z99.81

## 2020-09-02 LAB — URINALYSIS, ROUTINE W REFLEX MICROSCOPIC
Bilirubin Urine: NEGATIVE
Glucose, UA: NEGATIVE mg/dL
Hgb urine dipstick: NEGATIVE
Ketones, ur: 5 mg/dL — AB
Nitrite: NEGATIVE
Protein, ur: NEGATIVE mg/dL
Specific Gravity, Urine: 1.021 (ref 1.005–1.030)
WBC, UA: >50 WBC/hpf — ABNORMAL HIGH (ref 0–5)
pH: 5 (ref 5.0–8.0)

## 2020-09-02 LAB — COMPREHENSIVE METABOLIC PANEL
ALT: 14 U/L (ref 0–44)
AST: 24 U/L (ref 15–41)
Albumin: 4.3 g/dL (ref 3.5–5.0)
Alkaline Phosphatase: 57 U/L (ref 38–126)
Anion gap: 13 (ref 5–15)
BUN: 12 mg/dL (ref 8–23)
CO2: 27 mmol/L (ref 22–32)
Calcium: 10.2 mg/dL (ref 8.9–10.3)
Chloride: 98 mmol/L (ref 98–111)
Creatinine, Ser: 0.7 mg/dL (ref 0.44–1.00)
GFR, Estimated: >60 mL/min (ref >60)
Glucose, Bld: 100 mg/dL — ABNORMAL HIGH (ref 70–99)
Potassium: 3.6 mmol/L (ref 3.5–5.1)
Sodium: 138 mmol/L (ref 135–145)
Total Bilirubin: 0.5 mg/dL (ref 0.3–1.2)
Total Protein: 7.6 g/dL (ref 6.5–8.1)

## 2020-09-02 LAB — CBC WITH DIFFERENTIAL/PLATELET
Abs Immature Granulocytes: 0.05 10*3/uL (ref 0.00–0.07)
Basophils Absolute: 0.1 10*3/uL (ref 0.0–0.1)
Basophils Relative: 1 %
Eosinophils Absolute: 0.3 10*3/uL (ref 0.0–0.5)
Eosinophils Relative: 3 %
HCT: 45 % (ref 36.0–46.0)
Hemoglobin: 15 g/dL (ref 12.0–15.0)
Immature Granulocytes: 0 %
Lymphocytes Relative: 17 %
Lymphs Abs: 1.9 10*3/uL (ref 0.7–4.0)
MCH: 31.3 pg (ref 26.0–34.0)
MCHC: 33.3 g/dL (ref 30.0–36.0)
MCV: 93.8 fL (ref 80.0–100.0)
Monocytes Absolute: 0.9 10*3/uL (ref 0.1–1.0)
Monocytes Relative: 8 %
Neutro Abs: 8 10*3/uL — ABNORMAL HIGH (ref 1.7–7.7)
Neutrophils Relative %: 71 %
Platelets: 385 10*3/uL (ref 150–400)
RBC: 4.8 MIL/uL (ref 3.87–5.11)
RDW: 13.2 % (ref 11.5–15.5)
WBC: 11.3 10*3/uL — ABNORMAL HIGH (ref 4.0–10.5)
nRBC: 0 % (ref 0.0–0.2)

## 2020-09-02 LAB — TYPE AND SCREEN
ABO/RH(D): O POS
Antibody Screen: NEGATIVE

## 2020-09-02 LAB — SURGICAL PCR SCREEN
MRSA, PCR: NEGATIVE
Staphylococcus aureus: NEGATIVE

## 2020-09-02 NOTE — Patient Instructions (Addendum)
Your procedure is scheduled on:09-10-20 TUESDAY Report to the Registration Desk on the 1st floor of the [facility]- Then proceed to the 2nd floor Surgery Desk in the [facility] To find out your arrival time, please call [phone] between 1PM - 3PM on:09-06-20 FRIDAY  REMEMBER: Instructions that are not followed completely may result in serious medical risk, up to and including death; or upon the discretion of your surgeon and anesthesiologist your surgery may need to be rescheduled.  Do not eat food after midnight the night before surgery.  No gum chewing, lozengers or hard candies.  You may however, drink CLEAR liquids up to 2 hours before you are scheduled to arrive for your surgery. Do not drink anything within 2 hours of your scheduled arrival time.  Clear liquids include: - water  - apple juice without pulp - gatorad - black coffee or tea (Do NOT add milk or creamers to the coffee or tea) Do NOT drink anything that is not on this list.  In addition, your doctor has ordered for you to drink the provided  Ensure Pre-Surgery Clear Carbohydrate Drink  Drinking this carbohydrate drink up to two hours before surgery helps to reduce insulin resistance and improve patient outcomes. Please complete drinking 2 hours prior to scheduled arrival time.  TAKE THESE MEDICATIONS THE MORNING OF SURGERY WITH A SIP OF WATER: -LEXAPRO (ESCITALOPRAM) -ZETIA (EZETIMIBE) -PRILOSEC (OMEPRAZOLE)-take one the night before and one on the morning of surgery - helps to prevent nausea after surgery.)  USE YOUR SPIRIVA, ADVAIR AND ALBUTEROL INHALER THE MORNING OF SURGERY-BRING YOUR ALBUTEROL INHALER THE DAY OF SURGERY   One week prior to surgery: Stop Anti-inflammatories (NSAIDS) such as Advil, Aleve, Ibuprofen, Motrin, Naproxen, Naprosyn and Aspirin based products such as Excedrin, Goodys Powder, BC Powder-OK TO TAKE TYLENOL IF NEEDED  Stop ANY OVER THE COUNTER supplements/vitamins NOW (09-02-20) until  after surgery (VITAMIN D, ZINC AND MULTIVITAMIN)-HOWEVER, CONTINUE YOUR POTASSIUM UP UNTIL THE DAY PRIOR TO SURGERY  No Alcohol for 24 hours before or after surgery.  No Smoking including e-cigarettes for 24 hours prior to surgery.  No chewable tobacco products for at least 6 hours prior to surgery.  No nicotine patches on the day of surgery.  Do not use any "recreational" drugs for at least a week prior to your surgery.  Please be advised that the combination of cocaine and anesthesia may have negative outcomes, up to and including death. If you test positive for cocaine, your surgery will be cancelled.  On the morning of surgery brush your teeth with toothpaste and water, you may rinse your mouth with mouthwash if you wish. Do not swallow any toothpaste or mouthwash.  Do not wear jewelry, make-up, hairpins, clips or nail polish.  Do not wear lotions, powders, or perfumes.   Do not shave body from the neck down 48 hours prior to surgery just in case you cut yourself which could leave a site for infection.  Also, freshly shaved skin may become irritated if using the CHG soap.  Contact lenses, hearing aids and dentures may not be worn into surgery.  Do not bring valuables to the hospital. [facility] is not responsible for any missing/lost belongings or valuables.   Use CHG Soap as directed on instruction sheet.  Total Shoulder Arthroplasty:  use Benzolyl Peroxide 5% Gel as directed on instruction sheet.  Notify your doctor if there is any change in your medical condition (cold, fever, infection).  Wear comfortable clothing (specific to your  surgery type) to the hospital.  Plan for stool softeners for home use; pain medications have a tendency to cause constipation. You can also help prevent constipation by eating foods high in fiber such as fruits and vegetables and drinking plenty of fluids as your diet allows.  After surgery, you can help prevent lung complications by doing  breathing exercises.  Take deep breaths and cough every 1-2 hours. Your doctor may order a device called an Incentive Spirometer to help you take deep breaths. When coughing or sneezing, hold a pillow firmly against your incision with both hands. This is called "splinting." Doing this helps protect your incision. It also decreases belly discomfort.  If you are being admitted to the hospital overnight, leave your suitcase in the car. After surgery it may be brought to your room.  If you are being discharged the day of surgery, you will not be allowed to drive home. You will need a responsible adult (18 years or older) to drive you home and stay with you that night.   If you are taking public transportation, you will need to have a responsible adult (18 years or older) with you. Please confirm with your physician that it is acceptable to use public transportation.   Please call the [facility] Dept. at [phone] if you have any questions about these instructions.  Surgery Visitation Policy:  Patients undergoing a surgery or procedure may have one family member or support person with them as long as that person is not COVID-19 positive or experiencing its symptoms.  That person may remain in the waiting area during the procedure.  Inpatient Visitation:    Visiting hours are 7 a.m. to 8 p.m. Inpatients will be allowed two visitors daily. The visitors may change each day during the patient's stay. No visitors under the age of 11. Any visitor under the age of 38 must be accompanied by an adult. The visitor must pass COVID-19 screenings, use hand sanitizer when entering and exiting the patient's room and wear a mask at all times, including in the patient's room. Patients must also wear a mask when staff or their visitor are in the room. Masking is required regardless of vaccination status.

## 2020-09-03 ENCOUNTER — Telehealth: Payer: Self-pay

## 2020-09-03 NOTE — Telephone Encounter (Signed)
Pulmonary clearance form faxed to [facility] orthopedics at [phone] along with last ov note. Copy of surgical clearance placed in scan. [name]

## 2020-09-04 ENCOUNTER — Other Ambulatory Visit: Payer: PPO

## 2020-09-04 LAB — URINE CULTURE

## 2020-09-05 ENCOUNTER — Other Ambulatory Visit
Admission: RE | Admit: 2020-09-05 | Discharge: 2020-09-05 | Disposition: A | Discharge status: Home / Self Care | Payer: PPO | Source: Ambulatory Visit | Attending: Surgery | Admitting: Surgery

## 2020-09-05 ENCOUNTER — Other Ambulatory Visit: Payer: Self-pay

## 2020-09-05 DIAGNOSIS — Z20822 Contact with and (suspected) exposure to covid-19: Secondary | ICD-10-CM | POA: Diagnosis not present

## 2020-09-05 DIAGNOSIS — Z01812 Encounter for preprocedural laboratory examination: Secondary | ICD-10-CM | POA: Insufficient documentation

## 2020-09-05 LAB — SARS CORONAVIRUS 2 (TAT 6-24 HRS): SARS Coronavirus 2: NEGATIVE

## 2020-09-06 ENCOUNTER — Ambulatory Visit
Admission: RE | Admit: 2020-09-06 | Discharge: 2020-09-06 | Disposition: A | Discharge status: Home / Self Care | Payer: PPO | Source: Ambulatory Visit | Attending: Surgery | Admitting: Surgery

## 2020-09-06 DIAGNOSIS — M19012 Primary osteoarthritis, left shoulder: Secondary | ICD-10-CM

## 2020-09-06 DIAGNOSIS — M7582 Other shoulder lesions, left shoulder: Secondary | ICD-10-CM | POA: Insufficient documentation

## 2020-09-10 ENCOUNTER — Inpatient Hospital Stay: Payer: PPO | Admitting: Urgent Care

## 2020-09-10 ENCOUNTER — Other Ambulatory Visit: Payer: Self-pay

## 2020-09-10 ENCOUNTER — Encounter
Admission: RE | Disposition: A | Discharge status: Home / Self Care | Payer: Self-pay | Source: Home / Self Care | Attending: Surgery

## 2020-09-10 ENCOUNTER — Inpatient Hospital Stay: Payer: PPO

## 2020-09-10 ENCOUNTER — Inpatient Hospital Stay
Admission: RE | Admit: 2020-09-10 | Discharge: 2020-09-11 | DRG: 483 | Disposition: A | Discharge status: Home / Self Care | Payer: PPO | Attending: Surgery | Admitting: Surgery

## 2020-09-10 ENCOUNTER — Encounter: Payer: Self-pay | Admitting: Surgery

## 2020-09-10 DIAGNOSIS — Z9981 Dependence on supplemental oxygen: Secondary | ICD-10-CM

## 2020-09-10 DIAGNOSIS — I2510 Atherosclerotic heart disease of native coronary artery without angina pectoris: Secondary | ICD-10-CM | POA: Diagnosis present

## 2020-09-10 DIAGNOSIS — M810 Age-related osteoporosis without current pathological fracture: Secondary | ICD-10-CM | POA: Diagnosis not present

## 2020-09-10 DIAGNOSIS — Z885 Allergy status to narcotic agent status: Secondary | ICD-10-CM | POA: Diagnosis not present

## 2020-09-10 DIAGNOSIS — Z79891 Long term (current) use of opiate analgesic: Secondary | ICD-10-CM

## 2020-09-10 DIAGNOSIS — E785 Hyperlipidemia, unspecified: Secondary | ICD-10-CM | POA: Diagnosis not present

## 2020-09-10 DIAGNOSIS — Z9049 Acquired absence of other specified parts of digestive tract: Secondary | ICD-10-CM | POA: Diagnosis not present

## 2020-09-10 DIAGNOSIS — Z96612 Presence of left artificial shoulder joint: Secondary | ICD-10-CM

## 2020-09-10 DIAGNOSIS — J9611 Chronic respiratory failure with hypoxia: Secondary | ICD-10-CM | POA: Diagnosis present

## 2020-09-10 DIAGNOSIS — M19012 Primary osteoarthritis, left shoulder: Secondary | ICD-10-CM | POA: Diagnosis not present

## 2020-09-10 DIAGNOSIS — F339 Major depressive disorder, recurrent, unspecified: Secondary | ICD-10-CM | POA: Diagnosis not present

## 2020-09-10 DIAGNOSIS — Z87891 Personal history of nicotine dependence: Secondary | ICD-10-CM | POA: Diagnosis not present

## 2020-09-10 DIAGNOSIS — Z96642 Presence of left artificial hip joint: Secondary | ICD-10-CM | POA: Diagnosis not present

## 2020-09-10 DIAGNOSIS — Z7951 Long term (current) use of inhaled steroids: Secondary | ICD-10-CM

## 2020-09-10 DIAGNOSIS — K219 Gastro-esophageal reflux disease without esophagitis: Secondary | ICD-10-CM | POA: Diagnosis not present

## 2020-09-10 DIAGNOSIS — J449 Chronic obstructive pulmonary disease, unspecified: Secondary | ICD-10-CM | POA: Diagnosis not present

## 2020-09-10 DIAGNOSIS — M75102 Unspecified rotator cuff tear or rupture of left shoulder, not specified as traumatic: Secondary | ICD-10-CM | POA: Diagnosis not present

## 2020-09-10 DIAGNOSIS — M1990 Unspecified osteoarthritis, unspecified site: Secondary | ICD-10-CM | POA: Diagnosis not present

## 2020-09-10 DIAGNOSIS — I10 Essential (primary) hypertension: Secondary | ICD-10-CM | POA: Diagnosis not present

## 2020-09-10 DIAGNOSIS — Z79899 Other long term (current) drug therapy: Secondary | ICD-10-CM

## 2020-09-10 DIAGNOSIS — Z471 Aftercare following joint replacement surgery: Secondary | ICD-10-CM | POA: Diagnosis not present

## 2020-09-10 DIAGNOSIS — Z90710 Acquired absence of both cervix and uterus: Secondary | ICD-10-CM

## 2020-09-10 DIAGNOSIS — M7582 Other shoulder lesions, left shoulder: Secondary | ICD-10-CM | POA: Diagnosis not present

## 2020-09-10 DIAGNOSIS — M75112 Incomplete rotator cuff tear or rupture of left shoulder, not specified as traumatic: Secondary | ICD-10-CM | POA: Diagnosis not present

## 2020-09-10 DIAGNOSIS — I700 Atherosclerosis of aorta: Secondary | ICD-10-CM | POA: Diagnosis not present

## 2020-09-10 DIAGNOSIS — Z888 Allergy status to other drugs, medicaments and biological substances status: Secondary | ICD-10-CM

## 2020-09-10 DIAGNOSIS — Z9889 Other specified postprocedural states: Secondary | ICD-10-CM | POA: Diagnosis not present

## 2020-09-10 DIAGNOSIS — M778 Other enthesopathies, not elsewhere classified: Secondary | ICD-10-CM | POA: Diagnosis not present

## 2020-09-10 HISTORY — PX: REVERSE SHOULDER ARTHROPLASTY: SHX5054

## 2020-09-10 LAB — ABO/RH: ABO/RH(D): O POS

## 2020-09-10 SURGERY — ARTHROPLASTY, SHOULDER, TOTAL, REVERSE
Anesthesia: General | Site: Shoulder | Laterality: Left

## 2020-09-10 MED ORDER — HYDROMORPHONE HCL 1 MG/ML IJ SOLN: 0.2500 mg | INTRAMUSCULAR | Status: DC | PRN

## 2020-09-10 MED ORDER — CEFAZOLIN SODIUM-DEXTROSE 2-4 GM/100ML-% IV SOLN
2.0000 g | Freq: Four times a day (QID) | INTRAVENOUS | Status: AC
  Administered 2020-09-10 – 2020-09-11 (×3): 2 g via INTRAVENOUS
  Filled 2020-09-10 (×3): qty 100

## 2020-09-10 MED ORDER — PANTOPRAZOLE SODIUM 40 MG PO TBEC
40.0000 mg | DELAYED_RELEASE_TABLET | Freq: Every day | ORAL | Status: DC
  Administered 2020-09-11: 40 mg via ORAL
  Filled 2020-09-10: qty 1

## 2020-09-10 MED ORDER — ESCITALOPRAM OXALATE 10 MG PO TABS
10.0000 mg | ORAL_TABLET | ORAL | Status: DC
  Administered 2020-09-11: 10 mg via ORAL
  Filled 2020-09-10: qty 1

## 2020-09-10 MED ORDER — BUPIVACAINE-EPINEPHRINE (PF) 0.5% -1:200000 IJ SOLN
INTRAMUSCULAR | Status: AC
  Filled 2020-09-10: qty 30

## 2020-09-10 MED ORDER — DOCUSATE SODIUM 100 MG PO CAPS
100.0000 mg | ORAL_CAPSULE | Freq: Two times a day (BID) | ORAL | Status: DC
  Administered 2020-09-10 – 2020-09-11 (×2): 100 mg via ORAL
  Filled 2020-09-10 (×2): qty 1

## 2020-09-10 MED ORDER — CHLORHEXIDINE GLUCONATE 0.12 % MT SOLN: 15.0000 mL | Freq: Once | OROMUCOSAL | Status: AC

## 2020-09-10 MED ORDER — BUPIVACAINE-EPINEPHRINE (PF) 0.5% -1:200000 IJ SOLN
INTRAMUSCULAR | Status: DC | PRN
  Administered 2020-09-10: 30 mL via PERINEURAL

## 2020-09-10 MED ORDER — ACETAMINOPHEN 500 MG PO TABS
1000.0000 mg | ORAL_TABLET | Freq: Four times a day (QID) | ORAL | Status: AC
  Administered 2020-09-10 – 2020-09-11 (×3): 1000 mg via ORAL
  Filled 2020-09-10 (×4): qty 2

## 2020-09-10 MED ORDER — TRANEXAMIC ACID 1000 MG/10ML IV SOLN
INTRAVENOUS | Status: DC | PRN
  Administered 2020-09-10: 1000 mg via INTRAVENOUS

## 2020-09-10 MED ORDER — ALBUTEROL SULFATE HFA 108 (90 BASE) MCG/ACT IN AERS
2.0000 | INHALATION_SPRAY | Freq: Four times a day (QID) | RESPIRATORY_TRACT | Status: DC | PRN
  Filled 2020-09-10: qty 6.7

## 2020-09-10 MED ORDER — CHLORHEXIDINE GLUCONATE 0.12 % MT SOLN
OROMUCOSAL | Status: AC
  Administered 2020-09-10: 15 mL via OROMUCOSAL
  Filled 2020-09-10: qty 15

## 2020-09-10 MED ORDER — ONDANSETRON HCL 4 MG/2ML IJ SOLN
INTRAMUSCULAR | Status: DC | PRN
  Administered 2020-09-10: 4 mg via INTRAVENOUS

## 2020-09-10 MED ORDER — OXYCODONE HCL 5 MG PO TABS
5.0000 mg | ORAL_TABLET | ORAL | Status: DC | PRN
Start: 2020-09-10 — End: 2020-09-11

## 2020-09-10 MED ORDER — ONDANSETRON HCL 4 MG/2ML IJ SOLN: 4.0000 mg | Freq: Four times a day (QID) | INTRAMUSCULAR | Status: DC | PRN

## 2020-09-10 MED ORDER — KETOROLAC TROMETHAMINE 30 MG/ML IJ SOLN
INTRAMUSCULAR | Status: DC | PRN
  Administered 2020-09-10: 30 mg via INTRAVENOUS

## 2020-09-10 MED ORDER — ROCURONIUM BROMIDE 100 MG/10ML IV SOLN
INTRAVENOUS | Status: DC | PRN
  Administered 2020-09-10: 50 mg via INTRAVENOUS

## 2020-09-10 MED ORDER — ACETAMINOPHEN 10 MG/ML IV SOLN
INTRAVENOUS | Status: DC | PRN
  Administered 2020-09-10: 1000 mg via INTRAVENOUS

## 2020-09-10 MED ORDER — ONDANSETRON HCL 4 MG/2ML IJ SOLN
4.0000 mg | Freq: Once | INTRAMUSCULAR | Status: DC | PRN
Start: 2020-09-10 — End: 2020-09-10

## 2020-09-10 MED ORDER — METOCLOPRAMIDE HCL 10 MG PO TABS: 5.0000 mg | ORAL_TABLET | Freq: Three times a day (TID) | ORAL | Status: DC | PRN

## 2020-09-10 MED ORDER — TIOTROPIUM BROMIDE MONOHYDRATE 18 MCG IN CAPS
18.0000 ug | ORAL_CAPSULE | RESPIRATORY_TRACT | Status: DC
  Administered 2020-09-11: 18 ug via RESPIRATORY_TRACT
  Filled 2020-09-10: qty 5

## 2020-09-10 MED ORDER — KETOROLAC TROMETHAMINE 15 MG/ML IJ SOLN
7.5000 mg | Freq: Four times a day (QID) | INTRAMUSCULAR | Status: AC
  Administered 2020-09-10 – 2020-09-11 (×4): 7.5 mg via INTRAVENOUS
  Filled 2020-09-10 (×4): qty 1

## 2020-09-10 MED ORDER — ACETAMINOPHEN 325 MG PO TABS: 325.0000 mg | ORAL_TABLET | Freq: Four times a day (QID) | ORAL | Status: DC | PRN

## 2020-09-10 MED ORDER — PROPOFOL 10 MG/ML IV BOLUS
INTRAVENOUS | Status: AC
  Filled 2020-09-10: qty 20

## 2020-09-10 MED ORDER — BISACODYL 10 MG RE SUPP
10.0000 mg | Freq: Every day | RECTAL | Status: DC | PRN
  Filled 2020-09-10: qty 1

## 2020-09-10 MED ORDER — VITAMIN D 25 MCG (1000 UNIT) PO TABS
1000.0000 [IU] | ORAL_TABLET | Freq: Every day | ORAL | Status: DC
  Administered 2020-09-11: 1000 [IU] via ORAL
  Filled 2020-09-10: qty 1

## 2020-09-10 MED ORDER — ALBUTEROL SULFATE (2.5 MG/3ML) 0.083% IN NEBU: 2.5000 mg | INHALATION_SOLUTION | Freq: Four times a day (QID) | RESPIRATORY_TRACT | Status: DC | PRN

## 2020-09-10 MED ORDER — ONDANSETRON HCL 4 MG PO TABS
4.0000 mg | ORAL_TABLET | Freq: Four times a day (QID) | ORAL | Status: DC | PRN
Start: 2020-09-10 — End: 2020-09-11

## 2020-09-10 MED ORDER — EZETIMIBE 10 MG PO TABS
10.0000 mg | ORAL_TABLET | ORAL | Status: DC
  Administered 2020-09-11: 10 mg via ORAL
  Filled 2020-09-10: qty 1

## 2020-09-10 MED ORDER — ADULT MULTIVITAMIN W/MINERALS CH
1.0000 | ORAL_TABLET | Freq: Every day | ORAL | Status: DC
  Administered 2020-09-10 – 2020-09-11 (×2): 1 via ORAL
  Filled 2020-09-10: qty 1

## 2020-09-10 MED ORDER — LACTATED RINGERS IV SOLN: INTRAVENOUS | Status: DC | PRN

## 2020-09-10 MED ORDER — PROPOFOL 10 MG/ML IV BOLUS
INTRAVENOUS | Status: DC | PRN
  Administered 2020-09-10: 150 mg via INTRAVENOUS

## 2020-09-10 MED ORDER — CEFAZOLIN SODIUM-DEXTROSE 2-4 GM/100ML-% IV SOLN
INTRAVENOUS | Status: AC
  Filled 2020-09-10: qty 100

## 2020-09-10 MED ORDER — ZINC SULFATE 220 (50 ZN) MG PO CAPS
220.0000 mg | ORAL_CAPSULE | Freq: Every day | ORAL | Status: DC
  Administered 2020-09-10 – 2020-09-11 (×2): 220 mg via ORAL
  Filled 2020-09-10 (×2): qty 1

## 2020-09-10 MED ORDER — MOMETASONE FURO-FORMOTEROL FUM 200-5 MCG/ACT IN AERO
2.0000 | INHALATION_SPRAY | Freq: Two times a day (BID) | RESPIRATORY_TRACT | Status: DC
  Administered 2020-09-11: 2 via RESPIRATORY_TRACT
  Filled 2020-09-10: qty 8.8

## 2020-09-10 MED ORDER — FENTANYL CITRATE (PF) 100 MCG/2ML IJ SOLN
INTRAMUSCULAR | Status: DC | PRN
  Administered 2020-09-10 (×2): 25 ug via INTRAVENOUS
  Administered 2020-09-10: 50 ug via INTRAVENOUS

## 2020-09-10 MED ORDER — MOMETASONE FURO-FORMOTEROL FUM 200-5 MCG/ACT IN AERO
2.0000 | INHALATION_SPRAY | Freq: Two times a day (BID) | RESPIRATORY_TRACT | Status: DC
  Filled 2020-09-10: qty 8.8

## 2020-09-10 MED ORDER — TRAMADOL HCL 50 MG PO TABS
50.0000 mg | ORAL_TABLET | Freq: Four times a day (QID) | ORAL | Status: DC | PRN
  Administered 2020-09-10 – 2020-09-11 (×2): 50 mg via ORAL
  Filled 2020-09-10 (×2): qty 1

## 2020-09-10 MED ORDER — TRANEXAMIC ACID 1000 MG/10ML IV SOLN
INTRAVENOUS | Status: AC
  Filled 2020-09-10: qty 10

## 2020-09-10 MED ORDER — DEXAMETHASONE SODIUM PHOSPHATE 10 MG/ML IJ SOLN
INTRAMUSCULAR | Status: DC | PRN
  Administered 2020-09-10: 10 mg via INTRAVENOUS

## 2020-09-10 MED ORDER — SODIUM CHLORIDE FLUSH 0.9 % IV SOLN
INTRAVENOUS | Status: AC
  Filled 2020-09-10: qty 40

## 2020-09-10 MED ORDER — BUPIVACAINE LIPOSOME 1.3 % IJ SUSP
INTRAMUSCULAR | Status: AC
  Filled 2020-09-10: qty 20

## 2020-09-10 MED ORDER — MAGNESIUM HYDROXIDE 400 MG/5ML PO SUSP: 30.0000 mL | Freq: Every day | ORAL | Status: DC | PRN

## 2020-09-10 MED ORDER — FENTANYL CITRATE (PF) 100 MCG/2ML IJ SOLN
25.0000 ug | INTRAMUSCULAR | Status: DC | PRN
  Administered 2020-09-10: 25 ug via INTRAVENOUS

## 2020-09-10 MED ORDER — METOCLOPRAMIDE HCL 5 MG/ML IJ SOLN: 5.0000 mg | Freq: Three times a day (TID) | INTRAMUSCULAR | Status: DC | PRN

## 2020-09-10 MED ORDER — POTASSIUM CHLORIDE CRYS ER 10 MEQ PO TBCR
10.0000 meq | EXTENDED_RELEASE_TABLET | Freq: Every day | ORAL | Status: DC
  Administered 2020-09-11: 10 meq via ORAL
  Filled 2020-09-10: qty 1

## 2020-09-10 MED ORDER — FENTANYL CITRATE (PF) 100 MCG/2ML IJ SOLN
INTRAMUSCULAR | Status: AC
  Filled 2020-09-10: qty 2

## 2020-09-10 MED ORDER — MIDAZOLAM HCL 2 MG/2ML IJ SOLN
INTRAMUSCULAR | Status: DC | PRN
  Administered 2020-09-10: 1 mg via INTRAVENOUS

## 2020-09-10 MED ORDER — ZINC SULFATE 220 (50 ZN) MG PO CAPS
220.0000 mg | ORAL_CAPSULE | Freq: Every day | ORAL | Status: DC
  Filled 2020-09-10: qty 1

## 2020-09-10 MED ORDER — KETOROLAC TROMETHAMINE 15 MG/ML IJ SOLN: 15.0000 mg | Freq: Once | INTRAMUSCULAR | Status: DC

## 2020-09-10 MED ORDER — SODIUM CHLORIDE 0.9 % IV SOLN: INTRAVENOUS | Status: DC

## 2020-09-10 MED ORDER — SUGAMMADEX SODIUM 200 MG/2ML IV SOLN
INTRAVENOUS | Status: DC | PRN
  Administered 2020-09-10: 200 mg via INTRAVENOUS

## 2020-09-10 MED ORDER — FLEET ENEMA 7-19 GM/118ML RE ENEM: 1.0000 | ENEMA | Freq: Once | RECTAL | Status: DC | PRN

## 2020-09-10 MED ORDER — LACTATED RINGERS IV SOLN: INTRAVENOUS | Status: DC

## 2020-09-10 MED ORDER — PHENYLEPHRINE HCL (PRESSORS) 10 MG/ML IV SOLN
INTRAVENOUS | Status: DC | PRN
  Administered 2020-09-10: 100 ug via INTRAVENOUS
  Administered 2020-09-10: 200 ug via INTRAVENOUS
  Administered 2020-09-10 (×2): 50 ug via INTRAVENOUS
  Administered 2020-09-10: 100 ug via INTRAVENOUS

## 2020-09-10 MED ORDER — LIDOCAINE HCL (CARDIAC) PF 100 MG/5ML IV SOSY
PREFILLED_SYRINGE | INTRAVENOUS | Status: DC | PRN
  Administered 2020-09-10: 80 mg via INTRAVENOUS

## 2020-09-10 MED ORDER — ENOXAPARIN SODIUM 40 MG/0.4ML IJ SOSY
40.0000 mg | PREFILLED_SYRINGE | INTRAMUSCULAR | Status: DC
  Administered 2020-09-11: 40 mg via SUBCUTANEOUS
  Filled 2020-09-10: qty 0.4

## 2020-09-10 MED ORDER — ACETAMINOPHEN 10 MG/ML IV SOLN
INTRAVENOUS | Status: AC
  Filled 2020-09-10: qty 100

## 2020-09-10 MED ORDER — CEFAZOLIN SODIUM-DEXTROSE 2-4 GM/100ML-% IV SOLN
2.0000 g | INTRAVENOUS | Status: AC
  Administered 2020-09-10: 2 g via INTRAVENOUS

## 2020-09-10 MED ORDER — ORAL CARE MOUTH RINSE: 15.0000 mL | Freq: Once | OROMUCOSAL | Status: AC

## 2020-09-10 MED ORDER — SODIUM CHLORIDE 0.9 % IV SOLN
INTRAVENOUS | Status: DC | PRN
  Administered 2020-09-10: 30 mL

## 2020-09-10 MED ORDER — DIPHENHYDRAMINE HCL 12.5 MG/5ML PO ELIX
12.5000 mg | ORAL_SOLUTION | ORAL | Status: DC | PRN
Start: 2020-09-10 — End: 2020-09-11
  Filled 2020-09-10: qty 10

## 2020-09-10 MED ORDER — MIDAZOLAM HCL 2 MG/2ML IJ SOLN
INTRAMUSCULAR | Status: AC
  Filled 2020-09-10: qty 2

## 2020-09-10 SURGICAL SUPPLY — 73 items
BASEPLATE BOSS DRILL (MISCELLANEOUS) ×2 IMPLANT
BIT DRILL 2.5 (BIT) ×1
BIT DRILL 2.5X4.5XSCR (BIT) ×1 IMPLANT
BIT DRILL F/BASEPLATE CENTRAL (BIT) ×1 IMPLANT
BIT DRL 2.5X4.5XSCR (BIT) ×1 IMPLANT
BLADE SAW SAG 25X90X1.19 (BLADE) ×2 IMPLANT
BNDG COHESIVE 4X5 TAN STRL (GAUZE/BANDAGES/DRESSINGS) ×2 IMPLANT
CHLORAPREP W/TINT 26 (MISCELLANEOUS) ×2 IMPLANT
COOLER POLAR GLACIER W/PUMP (MISCELLANEOUS) ×2 IMPLANT
COVER BACK TABLE REUSABLE LG (DRAPES) ×2 IMPLANT
COVER WAND RF STERILE (DRAPES) ×2 IMPLANT
DRAPE 3/4 80X56 (DRAPES) ×4 IMPLANT
DRAPE IMP U-DRAPE 54X76 (DRAPES) ×4 IMPLANT
DRAPE INCISE IOBAN 66X45 STRL (DRAPES) ×4 IMPLANT
DRILL BASEPLATE CENTRAL  S (BIT) ×1
DRILL BASEPLATE CENTRAL S (BIT) ×1 IMPLANT
DRSG OPSITE POSTOP 4X8 (GAUZE/BANDAGES/DRESSINGS) ×2 IMPLANT
ELECT BLADE 6.5 EXT (BLADE) IMPLANT
ELECT CAUTERY BLADE 6.4 (BLADE) ×2 IMPLANT
ELECT REM PT RETURN 9FT ADLT (ELECTROSURGICAL) ×2 IMPLANT
ELECTRODE REM PT RTRN 9FT ADLT (ELECTROSURGICAL) ×1 IMPLANT
GAUZE XEROFORM 1X8 LF (GAUZE/BANDAGES/DRESSINGS) ×2 IMPLANT
GLENOSPHERE RSS 2 CONCENTRIC (Shoulder) ×2 IMPLANT
GLOVE SRG 8 PF TXTR STRL LF DI (GLOVE) ×2 IMPLANT
GLOVE SURG ENC MOIS LTX SZ7.5 (GLOVE) ×8 IMPLANT
GLOVE SURG ENC MOIS LTX SZ8 (GLOVE) ×8 IMPLANT
GLOVE SURG UNDER LTX SZ8 (GLOVE) ×2 IMPLANT
GLOVE SURG UNDER POLY LF SZ8 (GLOVE) ×2
GOWN STRL REUS W/ TWL LRG LVL3 (GOWN DISPOSABLE) ×1 IMPLANT
GOWN STRL REUS W/ TWL XL LVL3 (GOWN DISPOSABLE) ×1 IMPLANT
GOWN STRL REUS W/TWL LRG LVL3 (GOWN DISPOSABLE) ×1
GOWN STRL REUS W/TWL XL LVL3 (GOWN DISPOSABLE) ×1
GUIDE PIN 2.0 X 150 (WIRE) ×2 IMPLANT
HOOD PEEL AWAY FLYTE STAYCOOL (MISCELLANEOUS) ×6 IMPLANT
ILLUMINATOR WAVEGUIDE N/F (MISCELLANEOUS) IMPLANT
IV NS IRRIG 3000ML ARTHROMATIC (IV SOLUTION) ×2 IMPLANT
KIT STABILIZATION SHOULDER (MISCELLANEOUS) ×2 IMPLANT
KIT TURNOVER KIT A (KITS) ×2 IMPLANT
LINER STD +9S RSS HXL (Liner) ×2 IMPLANT
MANIFOLD NEPTUNE II (INSTRUMENTS) ×2 IMPLANT
MASK FACE SPIDER DISP (MASK) ×2 IMPLANT
MAT ABSORB  FLUID 56X50 GRAY (MISCELLANEOUS) ×1
MAT ABSORB FLUID 56X50 GRAY (MISCELLANEOUS) ×1 IMPLANT
NDL SAFETY ECLIPSE 18X1.5 (NEEDLE) ×1 IMPLANT
NEEDLE HYPO 18GX1.5 SHARP (NEEDLE) ×1
NEEDLE HYPO 22GX1.5 SAFETY (NEEDLE) ×2 IMPLANT
NEEDLE SPNL 20GX3.5 QUINCKE YW (NEEDLE) ×2 IMPLANT
NS IRRIG 500ML POUR BTL (IV SOLUTION) ×2 IMPLANT
PACK ARTHROSCOPY SHOULDER (MISCELLANEOUS) ×2 IMPLANT
PAD ARMBOARD 7.5X6 YLW CONV (MISCELLANEOUS) ×2 IMPLANT
PAD WRAPON POLAR SHDR UNIV (MISCELLANEOUS) ×1 IMPLANT
PENCIL SMOKE EVACUATOR (MISCELLANEOUS) ×2 IMPLANT
PLATE BASE REVERSE RSS S (Plate) ×2 IMPLANT
PULSAVAC PLUS IRRIG FAN TIP (DISPOSABLE) ×2 IMPLANT
SCREW 4.5X15 RSS W CAP (Screw) ×4 IMPLANT
SCREW 4.5X20 RSS W CAP (Screw) ×4 IMPLANT
SCREW 4.5X30 RSS W CAP (Screw) ×2 IMPLANT
SCREW BODY REVERSE LRG (Screw) ×2 IMPLANT
SLING ULTRA II M (MISCELLANEOUS) ×2 IMPLANT
SPONGE LAP 18X18 RF (DISPOSABLE) ×2 IMPLANT
STAPLER SKIN PROX 35W (STAPLE) ×2 IMPLANT
STEM PRES FIT SZ 12 TSS (Stem) ×2 IMPLANT
SUT ETHIBOND 0 MO6 C/R (SUTURE) ×2 IMPLANT
SUT FIBERWIRE #2 38 BLUE 1/2 (SUTURE) ×6 IMPLANT
SUT VIC AB 0 CT1 36 (SUTURE) ×2 IMPLANT
SUT VIC AB 2-0 CT1 27 (SUTURE) ×2
SUT VIC AB 2-0 CT1 TAPERPNT 27 (SUTURE) ×2 IMPLANT
SUTURE FIBERWR #2 38 BLUE 1/2 (SUTURE) ×3 IMPLANT
SYR 10ML LL (SYRINGE) ×2 IMPLANT
SYR 30ML LL (SYRINGE) ×2 IMPLANT
TIP FAN IRRIG PULSAVAC PLUS (DISPOSABLE) ×1 IMPLANT
TOWEL OR 17X26 4PK STRL BLUE (TOWEL DISPOSABLE) ×2 IMPLANT
WRAPON POLAR PAD SHDR UNIV (MISCELLANEOUS) ×2 IMPLANT

## 2020-09-10 NOTE — Transfer of Care (Signed)
Immediate Anesthesia Transfer of Care Note  Patient: Herman Mell  Procedure(s) Performed: REVERSE SHOULDER ARTHROPLASTY (Left Shoulder)  Patient Location: PACU  Anesthesia Type:General  Level of Consciousness: awake, alert  and oriented  Airway & Oxygen Therapy: Patient Spontanous Breathing and Patient connected to face mask oxygen  Post-op Assessment: Report given to RN and Post -op Vital signs reviewed and stable  Post vital signs: Reviewed and stable  Last Vitals:  Vitals Value Taken Time  BP 156/78 09/10/20 1002  Temp    Pulse 108 09/10/20 1003  Resp 14 09/10/20 1003  SpO2 97 % 09/10/20 1003  Vitals shown include unvalidated device data.  Last Pain:  Vitals:   09/10/20 0617  TempSrc: Temporal  PainSc: 2          Complications: No complications documented.

## 2020-09-10 NOTE — Progress Notes (Signed)
OT Cancellation Note  Patient Details Name: Joyce Rodriguez MRN: [id number] DOB: 03/03/1946   Cancelled Treatment:    Reason Eval/Treat Not Completed: Other (comment). Order received and chart reviewed. Pt reporting she has sent her husband home for the evening, plan for him to return tmrw. Pt requires substantial caregiver education on sling mgmt, UE precautions, and adapted dressing techniques. Will hold this PM. Plan to initiate services next date as pt/caregiver available.   Dessie Coma, M.S. OTR/L  09/10/20, 2:13 PM  ascom [phone]

## 2020-09-10 NOTE — Evaluation (Signed)
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 Patient had irreparable rotator cuff tear with cuff arthropathy 09/10/20. Past medical history of COPD, oxygen dependent, hypertension, depression, GERD    Clinical Impression  Patient agreeable to PT evaluation and reports 4/10 pain in left shoulder. Patient was able to mobilize with minimal assistance and able to maintain NWB of LUE with shoulder immobilizer with abduction pillow in place. Oxygen removed briefly with standing/ambulation with Sp02 95%. At rest, Sp02 99% on 3L02. Standing activity tolerance limited by generalized weakness and feeling fatigued with minimal activity. Recommend to assess need for assistive device with next ambulation bout as patient reaching out for furniture intermittently for support with walking. Patient has supportive family who will assist at home as needed per patient report. PT will continue to follow to maximize independence and facilitate return to prior level of function.     Follow Up Recommendations Home health PT;Supervision - Intermittent    Equipment Recommendations  Other (comment) (ongoing assessment, might need cane)    Recommendations for Other Services       Precautions / Restrictions Precautions Precautions: Fall;Shoulder Precaution Booklet Issued: No Precaution Comments: shoulder immobilizer with an abduction pillow Restrictions Weight Bearing Restrictions: Yes LUE Weight Bearing: Non weight bearing      Mobility  Bed Mobility Overal bed mobility: Needs Assistance Bed Mobility: Supine to Sit;Sit to Supine     Supine to sit: Supervision;HOB elevated Sit to supine: Min assist   General bed mobility comments: occasional assistance for LE support. with verbal cues for  technique, patient was able to maintain NWB of LUE without difficulty with bed mobility    Transfers Overall transfer level: Needs assistance   Transfers: Sit to/from Stand Sit to Stand: Min assist         General transfer comment: Min A for lifting assistance. verbal cues for technique to maintain NWB of LUE  Ambulation/Gait Ambulation/Gait assistance: Min assist Gait Distance (Feet): 5 Feet Assistive device: None Gait Pattern/deviations: Step-to pattern Gait velocity: decreased   General Gait Details: patient ambulated 5 ft and felt her legs were weak and further ambulation deferred. removed 02 briefly with standing/ambulation with Sp02 95%. patient with tendency to reach out for furniture for support with [place]. will assess the need for assistive device with next ambulation bout  Stairs            Wheelchair Mobility    Modified Rankin (Stroke Patients Only)       Balance Overall balance assessment: Needs assistance Sitting-balance support: Feet supported Sitting balance-Leahy Scale: Good     Standing balance support: Single extremity supported Standing balance-Leahy Scale: Fair                               Pertinent Vitals/Pain Pain Assessment: 0-10 Pain Score: 4  Pain Location: left shouulder Pain Descriptors / Indicators: Discomfort Pain Intervention(s): Limited activity within patient's tolerance (polar care re-applied at end of session)    Home Living Family/patient expects to be discharged to:: Private residence Living Arrangements: Spouse/significant other Available Help at Discharge: Family;Available 24 hours/day Type of Home: House Home Access: Stairs to enter   Entrance Stairs-Number of Steps: 2   Home Equipment: Shower seat (shower seat does not extend over  the tub per patient report) Additional Comments: 3 L02 at baseline, PRN during the day and all the time at night    Prior Function Level of Independence: Independent          Comments: Patient was independent with mobility and ADLs at baseline     Hand Dominance        Extremity/Trunk Assessment   Upper Extremity Assessment Upper Extremity Assessment: LUE deficits/detail (RUE WFL for tasks assessed) LUE Deficits / Details: patient able to activate digit movement LUE: Unable to fully assess due to immobilization (did not assess shoulder) LUE Sensation: WNL (at hand and elbow light touch sensation intact)    Lower Extremity Assessment Lower Extremity Assessment: Generalized weakness (no focal weakness noted, however patient does report generalized weakness in legs with standing)       Communication   Communication: No difficulties  Cognition Arousal/Alertness: Awake/alert Behavior During Therapy: WFL for tasks assessed/performed Overall Cognitive Status: Within Functional Limits for tasks assessed                                        General Comments      Exercises     Assessment/Plan    PT Assessment Patient needs continued PT services  PT Problem List Decreased strength;Decreased range of motion;Decreased activity tolerance;Decreased balance;Decreased mobility;Decreased knowledge of use of DME;Decreased safety awareness;Decreased knowledge of precautions;Pain       PT Treatment Interventions DME instruction;Gait training;Stair training;Functional mobility training;Therapeutic activities;Therapeutic exercise;Balance training;Neuromuscular re-education;Cognitive remediation    PT Goals (Current goals can be found in the Care Plan section)  Acute Rehab PT Goals Patient Stated Goal: to get back home PT Goal Formulation: With patient Time For Goal Achievement: 09/24/20 Potential to Achieve Goals: Good    Frequency 7X/week   Barriers to discharge        Co-evaluation               AM-PAC PT "6 Clicks" Mobility  Outcome Measure Help needed turning from your back to your side while in a flat bed without  using bedrails?: A Little Help needed moving from lying on your back to sitting on the side of a flat bed without using bedrails?: A Little Help needed moving to and from a bed to a chair (including a wheelchair)?: A Little Help needed standing up from a chair using your arms (e.g., wheelchair or bedside chair)?: A Little Help needed to walk in hospital room?: A Little Help needed climbing 3-5 steps with a railing? : A Little 6 Click Score: 18    End of Session Equipment Utilized During Treatment: Oxygen (left shoulder immobilizer with abduction brace) Activity Tolerance: Patient limited by fatigue Patient left: in bed;with call bell/phone within reach;with bed alarm set;with SCD's reapplied;with nursing/sitter in room (polar care re-applied to left shoulder) Nurse Communication: Mobility status PT Visit Diagnosis: Unsteadiness on feet (R26.81);Muscle weakness (generalized) (M62.81);Difficulty in walking, not elsewhere classified (R26.2);Pain Pain - Right/Left: Left Pain - part of body: Shoulder    Time: 6606-0045 PT Time Calculation (min) (ACUTE ONLY): 24 min   Charges:   PT Evaluation $PT Eval Low Complexity: 1 Low PT Treatments $Therapeutic Activity: 8-22 mins        Minna Merritts, PT, MPT   Percell Locus 09/10/2020, 2:14 PM

## 2020-09-10 NOTE — Anesthesia Procedure Notes (Signed)
Procedure Name: Intubation Date/Time: 09/10/2020 7:46 AM Performed by: Philbert Riser, CRNA Pre-anesthesia Checklist: Patient identified, Emergency Drugs available, Suction available and Patient being monitored Patient Re-evaluated:Patient Re-evaluated prior to induction Oxygen Delivery Method: Circle system utilized Preoxygenation: Pre-oxygenation with 100% oxygen Induction Type: IV induction Ventilation: Mask ventilation without difficulty Laryngoscope Size: McGraph and 3 Grade View: Grade I Tube type: Oral Tube size: 7.0 mm Number of attempts: 1 Airway Equipment and Method: Stylet and Oral airway Placement Confirmation: ETT inserted through vocal cords under direct vision,  positive ETCO2 and breath sounds checked- equal and bilateral Secured at: 22 cm Tube secured with: Tape Dental Injury: Teeth and Oropharynx as per pre-operative assessment

## 2020-09-10 NOTE — Plan of Care (Signed)
New admission

## 2020-09-10 NOTE — H&P (Signed)
History of Present Illness:  Joyce Rodriguez is a 75 y.o. female who presents for follow-up of her left shoulder pain secondary to impingement/tendinopathy with underlying degenerative joint disease. The patient was last seen for these symptoms 6 months ago. She notes little change in her symptoms since then. She continues to complain of a constant aching discomfort in her shoulder which is worse at night and with certain activities, especially when trying to reach above shoulder level or behind her back. She has been taking Tylenol and Goody powder as necessary with limited benefit. She notes that her pain will awaken her from sleep. She denies any reinjury to the shoulder, and denies any numbness or paresthesias down her arm to her hand. She recently has seen her pulmonologist who has okayed her for surgery from a pulmonary standpoint. She would like to proceed with scheduling surgery on her left shoulder at this time.  Current Outpatient Medications: . acetaminophen (TYLENOL) 500 MG tablet Take by mouth every 6 (six) hours  . albuterol (PROAIR HFA) 90 mcg/actuation inhaler INHALE 2 PUFFS BY MOUTH EVERY 6 HOURS AS NEEDED  . cholecalciferol (VITAMIN D3) 2,000 unit capsule Take 2,000 Units by mouth once daily  . escitalopram oxalate (LEXAPRO) 10 MG tablet Take 1 tablet (10 mg total) by mouth once daily 90 tablet 3  . ezetimibe (ZETIA) 10 mg tablet Take 1 tablet (10 mg total) by mouth once daily 90 tablet 3  . fluticasone propion-salmeterol (ADVAIR DISKUS) 250-50 mcg/dose diskus inhaler Inhale 1 inhalation into the lungs every 12 (twelve) hours  . montelukast (SINGULAIR) 10 mg tablet Take 1 tablet (10 mg total) by mouth nightly 30 tablet 11  . MULTIVITAMIN ORAL Take by mouth once daily  . OXYGEN-AIR DELIVERY SYSTEMS MISC Inhale into the lungs  . potassium chloride (KLOR-CON) 10 MEQ ER tablet Take 1 tablet (10 mEq total) by mouth once daily 90 tablet 3  . tiotropium (SPIRIVA WITH HANDIHALER) 18 mcg  inhalation capsule INHALE THE CONTENTS OF 1 CAPSULE VIA INHALATION DEVICE EVERY DAY  . traMADoL (ULTRAM) 50 mg tablet Take 1 tablet by mouth as needed  . ZINC ORAL Take by mouth once daily   Allergies:  . Atorvastatin Calcium Other (Myalgias)  . Codeine Nausea And Vomiting (Pt states that she can tolerate Tramadol fine)   Past Medical History:  . COPD (chronic obstructive pulmonary disease) (CMS-HCC)   Past Surgical History:  . CHOLECYSTECTOMY OPEN  . foot surgery Left  . hernia in esophagus  . HYSTERECTOMY VAGINAL  . shoulder surgery Right   Past Family History: No family history on file.   Social History:   Socioeconomic History:  Marland Kitchen Marital status: Married  Tobacco Use  . Smoking status: Former Smoker  Quit date: 05/07/2000  Years since quitting: 20.3  . Smokeless tobacco: Never Used  Vaping Use  . Vaping Use: Never used  Substance and Sexual Activity  . Alcohol use: Never  . Drug use: Never   Review of Systems:  A comprehensive 14 point ROS was performed, reviewed, and the pertinent orthopaedic findings are documented in the HPI.  Physical Exam: Vitals:  08/23/20 0821  BP: 118/82  Weight: 62.8 kg (138 lb 6.4 oz)  Height: 157.5 cm (5\' 2" )  PainSc: 3  PainLoc: Shoulder   General/Constitutional: The patient appears to be well-nourished, well-developed, and in no acute distress. Neuro/Psych: Normal mood and affect, oriented to person, place and time. Eyes: Non-icteric. Pupils are equal, round, and reactive to light, and exhibit synchronous movement.  ENT: Unremarkable. Lymphatic: No palpable adenopathy. Respiratory: Lungs clear to auscultation, Normal chest excursion, No wheezes and Non-labored breathing. Possible minor crackle at base of right lobe. Cardiovascular: Regular rate and rhythm. No murmurs. and No edema, swelling or tenderness, except as noted in detailed exam. Integumentary: No impressive skin lesions present, except as noted in detailed  exam. Musculoskeletal: Unremarkable, except as noted in detailed exam.  Leftshoulder exam: SKIN:Normal SWELLING:None WARMTH:None LYMPH NODES: No adenopathy palpable CREPITUS:Moderateglenohumeral crepitus TENDERNESS:Mildly tender over anterior shoulder ROM (active):  Forward flexion:90degrees Abduction: 95 degrees Internal rotation: LeftPSIS ROM (passive):  Forward flexion: 125degrees Abduction: 120degrees ER/IR at 90 abd: 70 degrees/40 degrees  She describes mild-moderate pain with forward flexion and abduction, and mild pain with internal rotation.  STRENGTH: Forward flexion: 3+-4/5 Abduction: 3+-4/5 External rotation: 4/5 Internal rotation: 4-4+/5 Pain with RC testing: Moderate pain with resisted abduction  STABILITY:Normal  SPECIAL TESTS: Luan Pulling' test: Mild-moderately positive Speed's test: Negative Capsulitis - pain w/ passive ER: No Crossed arm test: Mildly positive Crank: Not evaluated Anterior apprehension: Negative Posterior apprehension: Not evaluated  She again is neurovascularly intact to the leftupper extremity.  Assessment: 1. Primary osteoarthritis of left shoulder. 2. Rotator cuff tendinitis, left.   Plan: The treatment options were discussed with the patient and her husband. In addition, patient educational materials were provided regarding the diagnosis and treatment options. The patient is quite frustrated by her persistent symptoms and function limitations, and is ready to consider more aggressive treatment options. Therefore, I have recommended a surgical procedure, specifically a reverse left  total shoulder arthroplasty. The procedure was discussed with the patient, as were the potential risks (including bleeding, infection, nerve and/or blood vessel injury, persistent or recurrent pain, loosening and/or failure of the components, dislocation, need for further surgery, blood clots, strokes, heart attacks and/or arhythmias, pneumonia, etc.) and benefits. The patient states his/her understanding and wishes to proceed. All of the patient's questions and concerns were answered. She can call any time with further concerns. She will follow up post-surgery, routine.   H&P reviewed and patient re-examined. No changes.

## 2020-09-10 NOTE — Anesthesia Postprocedure Evaluation (Signed)
Anesthesia Post Note  Patient: Joyce Rodriguez  Procedure(s) Performed: REVERSE SHOULDER ARTHROPLASTY (Left Shoulder)  Patient location during evaluation: PACU Anesthesia Type: General Level of consciousness: awake and alert Pain management: pain level controlled Vital Signs Assessment: post-procedure vital signs reviewed and stable Respiratory status: spontaneous breathing and respiratory function stable Cardiovascular status: stable Anesthetic complications: no   No complications documented.   Last Vitals:  Vitals:   09/10/20 1032 09/10/20 1104  BP: 135/79 (!) 145/78  Pulse: 100 94  Resp: 11 12  Temp:  37.1 C  SpO2: 98% 98%    Last Pain:  Vitals:   09/10/20 1104  TempSrc:   PainSc: 0-No pain                 Raeya Merritts K

## 2020-09-10 NOTE — Op Note (Signed)
09/10/2020  9:47 AM  Patient:   Joyce Rodriguez  Pre-Op Diagnosis:   Massive irreparable rotator cuff tear with cuff arthropathy, left shoulder.  Post-Op Diagnosis:   Same.  Procedure:   Reverse left total shoulder arthroplasty.  Surgeon:   Pascal Lux, MD  Assistant:   Cameron Proud, PA-C  Anesthesia:   GET  Findings:   As above.  Complications:   None  EBL:   50 cc  Fluids:   800 cc crystalloid  UOP:   None  TT:   None  Drains:   None  Closure:   Staples  Implants:   All press-fit Integra system with a 12 mm stem, a large metaphyseal body, a + mm humeral platform, a mini baseplate, and a 38 mm concentric +2 mm laterally offset glenosphere.  Brief Clinical Note:   The patient is a 75 year old female with a long history of progressively worsening left shoulder pain and weakness. Her symptoms have progressed despite medications, activity modification, etc. Her history and examination are consistent with rotator cuff arthropathy, confirmed by preoperative MRI scanning. The patient presents at this time for a reverse left total shoulder arthroplasty.  Procedure:   The patient was brought into the operating room and lain in the supine position. The patient then underwent general endotracheal intubation and anesthesia before the patient was repositioned in the beach chair position using the beach chair positioner. The left shoulder and upper extremity were prepped with ChloraPrep solution before being draped sterilely. Preoperative antibiotics were administered.   A timeout was performed to verify the appropriate surgical site before a standard anterior approach to the shoulder was made through an approximately 4-5 inch incision. The incision was carried down through the subcutaneous tissues to expose the deltopectoral fascia. The interval between the deltoid and pectoralis muscles was identified and this plane developed, retracting the cephalic vein laterally with the deltoid  muscle. The conjoined tendon was identified. Its lateral margin was dissected and the Kolbel self-retraining retractor inserted. The "three sisters" were identified and cauterized. Bursal tissues were removed to improve visualization. The subscapularis tendon was released from its attachment to the lesser tuberosity 1 cm proximal to its insertion and several tagging sutures placed. The inferior capsule was released with care after identifying and protecting the axillary nerve. The proximal humeral cut was made at approximately 20 of retroversion using the extra-medullary guide.   Attention was redirected to the glenoid. The labrum was debrided circumferentially before the center of the glenoid was identified. The guidewire was drilled into the glenoid neck using the appropriate guide. After verifying its position, it was overreamed with the mini-baseplate reamer to create a flat surface before the stem reamer was utilized. The superior and inferior peg sites were reamed using the appropriate guide to complete the glenoid preparation. The permanent mini-baseplate was impacted into place. It was stabilized with a 20 x 4.5 mm central screw and four peripheral screws. Locking caps were placed over the superior and inferior screws. The permanent 38 mm concentric glenosphere with +2 mm of lateral offset was then impacted into place and its Morse taper locking mechanism verified using manual distraction.  Attention was directed to the humeral side. The humeral canal was prepared utilizing the tapered stem reamers sequentially beginning with the 7 mm stem and progressing to a 12 mm stem. This demonstrated a good tight fit. The metaphyseal region was then prepared using the appropriate planar device. The trial stem and first the medium and then the large  metaphyseal body were put together on the back table and several trial reductions performed using the +0 mm, +6 mm, and +9 mm inserts. With the +9 mm insert in the  large metaphyseal body, the arm demonstrated excellent range of motion as the hand could be brought across the chest to the opposite shoulder and brought to the top of the patient's head and to the patient's ear. The shoulder appeared stable throughout this range of motion. The joint was dislocated and the trial components removed. The permanent 12 mm stem with the large metaphyseal body was impacted into place with care taken to maintain the appropriate version. A repeat trial reduction with the +9 mm insert again demonstrated excellent stability with the findings as described above. Therefore, the shoulder was re-dislocated and, after inserting the locking screw to secure the body to the stem, the permanent +9 mm insert impacted into place. After verifying its locking mechanism, the shoulder was relocated using two finger pressure and again placed through a range of motion with the findings as described above.  The wound was copiously irrigated with sterile saline solution using the jet lavage system before a total of 20 cc of Exparel diluted out to 60 cc with normal saline and 30 cc of 0.5% Sensorcaine with epinephrine was injected into the pericapsular and peri-incisional tissues to help with postoperative analgesia. The subscapularis tendon was reapproximated using #2 FiberWire interrupted sutures. The deltopectoral interval was closed using #0 Vicryl interrupted sutures before the subcutaneous tissues were closed using 2-0 Vicryl interrupted sutures. The skin was closed using staples. Prior to closing the skin, 1 g of transexemic acid in 10 cc of normal saline was injected intra-articularly to help with postoperative bleeding. A sterile occlusive dressing was applied to the wound before the arm was placed into a shoulder immobilizer with an abduction pillow. A Polar Care system also was applied to the shoulder. The patient was then transferred back to a hospital bed before being awakened, extubated, and  returned to the recovery room in satisfactory condition after tolerating the procedure well.

## 2020-09-10 NOTE — Anesthesia Preprocedure Evaluation (Signed)
Anesthesia Evaluation  Patient identified by MRN, date of birth, ID band Patient awake    Reviewed: Allergy & Precautions, NPO status , Patient's Chart, lab work & pertinent test results  History of Anesthesia Complications (+) PROLONGED EMERGENCE and history of anesthetic complications  Airway Mallampati: II       Dental   Pulmonary neg sleep apnea, COPD (3L O2 constantly),  COPD inhaler and oxygen dependent, Not current smoker, former smoker,           Cardiovascular hypertension, Pt. on medications (-) Past MI and (-) CHF (-) dysrhythmias (-) Valvular Problems/Murmurs     Neuro/Psych neg Seizures Depression    GI/Hepatic Neg liver ROS, hiatal hernia, PUD, GERD  Medicated and Controlled,  Endo/Other  neg diabetes  Renal/GU negative Renal ROS     Musculoskeletal   Abdominal   Peds  Hematology   Anesthesia Other Findings   Reproductive/Obstetrics                             Anesthesia Physical Anesthesia Plan  ASA: III  Anesthesia Plan: General   Post-op Pain Management:    Induction: Intravenous  PONV Risk Score and Plan: 3 and Ondansetron and Dexamethasone  Airway Management Planned: Oral ETT  Additional Equipment:   Intra-op Plan:   Post-operative Plan:   Informed Consent: I have reviewed the patients History and Physical, chart, labs and discussed the procedure including the risks, benefits and alternatives for the proposed anesthesia with the patient or authorized representative who has indicated his/her understanding and acceptance.       Plan Discussed with:   Anesthesia Plan Comments:         Anesthesia Quick Evaluation

## 2020-09-11 LAB — BASIC METABOLIC PANEL
Anion gap: 9 (ref 5–15)
BUN: 7 mg/dL — ABNORMAL LOW (ref 8–23)
CO2: 26 mmol/L (ref 22–32)
Calcium: 8.9 mg/dL (ref 8.9–10.3)
Chloride: 101 mmol/L (ref 98–111)
Creatinine, Ser: 0.55 mg/dL (ref 0.44–1.00)
GFR, Estimated: >60 mL/min (ref >60)
Glucose, Bld: 125 mg/dL — ABNORMAL HIGH (ref 70–99)
Potassium: 3.7 mmol/L (ref 3.5–5.1)
Sodium: 136 mmol/L (ref 135–145)

## 2020-09-11 LAB — CBC
HCT: 38.1 % (ref 36.0–46.0)
Hemoglobin: 12.6 g/dL (ref 12.0–15.0)
MCH: 31.5 pg (ref 26.0–34.0)
MCHC: 33.1 g/dL (ref 30.0–36.0)
MCV: 95.3 fL (ref 80.0–100.0)
Platelets: 279 10*3/uL (ref 150–400)
RBC: 4 MIL/uL (ref 3.87–5.11)
RDW: 13.2 % (ref 11.5–15.5)
WBC: 14.3 10*3/uL — ABNORMAL HIGH (ref 4.0–10.5)
nRBC: 0 % (ref 0.0–0.2)

## 2020-09-11 MED ORDER — TRAMADOL HCL 50 MG PO TABS: 50.0000 mg | ORAL_TABLET | Freq: Four times a day (QID) | ORAL | 0 refills | Status: DC | PRN

## 2020-09-11 MED ORDER — ASPIRIN EC 325 MG PO TBEC: 325.0000 mg | DELAYED_RELEASE_TABLET | Freq: Every day | ORAL | 0 refills | Status: DC

## 2020-09-11 MED ORDER — ONDANSETRON HCL 4 MG PO TABS: 4.0000 mg | ORAL_TABLET | Freq: Four times a day (QID) | ORAL | 1 refills | Status: DC | PRN

## 2020-09-11 MED ORDER — OXYCODONE HCL 5 MG PO TABS: 5.0000 mg | ORAL_TABLET | ORAL | 0 refills | Status: DC | PRN

## 2020-09-11 NOTE — Progress Notes (Signed)
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 Patient had irreparable rotator cuff tear with cuff arthropathy 09/10/20. Past medical history of COPD, oxygen dependent, hypertension, depression, GERD    PT Comments    Pt was long sitting in bed with family members at bedside. She is A and O x 4 and agreeable to session. ON 3 L O2 [place] at rest however required 4 L O2 during session to maintain > 88%. Pt on 3 L O2 at home and has equipment needs already met. She was able to exit bed, stand, and ambulate with SPC without much assistance. Will have 24 hours assist at home when DC. Recommend HHPT to follow at DC to improve activity tolerance, gait safety, and dynamic balance. Pt will have family get [facility] and will use at home. She did perform ascending descending 4 stair without assistance. Pt is mostly limited by fatigue. Gets SOB with minimal activity. She is progressing well towards all goals and is cleared from a PT standpoint for safe DC home with 24/7 assistance. Reviewed [facility] care and HHPT. She was sitting in recliner post session with call bell in reach and chair alarm in place.      Follow Up Recommendations  Supervision/Assistance - 24 hour;Home health PT     Equipment Recommendations  None recommended by PT       Precautions / Restrictions Precautions Precautions: Fall;Shoulder Precaution Booklet Issued: No Precaution Comments: shoulder immobilizer with an abduction pillow Restrictions Weight Bearing Restrictions: Yes LUE Weight Bearing: Non weight bearing    Mobility  Bed Mobility Overal bed mobility: Needs Assistance Bed Mobility: Supine to Sit     Supine to sit: Min assist;HOB elevated     General bed mobility comments: pt required min assist to exit R side of  bed    Transfers Overall transfer level: Needs assistance Equipment used: Straight cane Transfers: Sit to/from Stand Sit to Stand: Min guard         General transfer comment: stood to [facility] with CGA. pt will have assistance 24/7 at DC  Ambulation/Gait Ambulation/Gait assistance: Min guard;Supervision Gait Distance (Feet): 70 Feet Assistive device: Straight cane Gait Pattern/deviations: Step-to pattern Gait velocity: decreased   General Gait Details: pt was able to ambulate 70 ft with [facility] however has some unsteadiness noted. distance was limited by SOB/fatigue. did drop to 86% on 3 L however once incraesed to 4 L o2 sao2 > 90%   Stairs Stairs: Yes Stairs assistance: Supervision Stair Management: One rail Right;Alternating pattern Number of Stairs: 4 General stair comments: pt was able to ambulate up/down 4 stairs with +1 UE support       Balance Overall balance assessment: Needs assistance Sitting-balance support: Feet supported Sitting balance-Leahy Scale: Good     Standing balance support: Single extremity supported;During functional activity Standing balance-Leahy Scale: Fair Standing balance comment: pt is high fall risk       Cognition Arousal/Alertness: Awake/alert Behavior During Therapy: WFL for tasks assessed/performed Overall Cognitive Status: Within Functional Limits for tasks assessed      General Comments:  (pt is A and O x 4)             Pertinent Vitals/Pain Pain Assessment: No/denies pain Pain Location: left shouulder Pain Descriptors / Indicators: Discomfort;Sore;Burning  PT Goals (current goals can now be found in the care plan section) Acute Rehab PT Goals Patient Stated Goal: home Progress towards PT goals: Progressing toward goals    Frequency    7X/week      PT Plan Current plan remains appropriate       AM-PAC PT "6 Clicks" Mobility   Outcome Measure  Help needed turning from your back to your side while  in a flat bed without using bedrails?: A Little Help needed moving from lying on your back to sitting on the side of a flat bed without using bedrails?: A Little Help needed moving to and from a bed to a chair (including a wheelchair)?: A Little Help needed standing up from a chair using your arms (e.g., wheelchair or bedside chair)?: A Little Help needed to walk in hospital room?: A Little Help needed climbing 3-5 steps with a railing? : A Little 6 Click Score: 18    End of Session Equipment Utilized During Treatment: Oxygen (required 4 L o2 [place] to maintain > 88%) Activity Tolerance: Patient limited by fatigue Patient left: in chair;with call bell/phone within reach;with chair alarm set;with family/visitor present Nurse Communication: Mobility status PT Visit Diagnosis: Unsteadiness on feet (R26.81);Muscle weakness (generalized) (M62.81);Difficulty in walking, not elsewhere classified (R26.2);Pain Pain - Right/Left: Left Pain - part of body: Shoulder     Time: 2811-8867 PT Time Calculation (min) (ACUTE ONLY): 32 min  Charges:  $Gait Training: 23-37 mins                     Julaine Fusi PTA 09/11/20, 11:55 AM

## 2020-09-11 NOTE — Discharge Instructions (Signed)
Diet: As you were doing prior to hospitalization   Shower:  May shower but keep the wounds dry, use an occlusive plastic wrap, NO SOAKING IN TUB.  If the bandage gets wet, change with a clean dry gauze.  Dressing:  You may change your dressing as needed. Change the dressing with sterile gauze dressing.    Activity:  Increase activity slowly as tolerated, but follow the weight bearing instructions below.  No lifting or driving for 6 weeks.  Weight Bearing:   Non-weightbearing to the left arm.  Blood Clot Prevention: Take 1 325mg  aspirin daily for the next 6 weeks.  To prevent constipation: you may use a stool softener such as -  Colace (over the counter) 100 mg by mouth twice a day  Drink plenty of fluids (prune juice may be helpful) and high fiber foods Miralax (over the counter) for constipation as needed.    Itching:  If you experience itching with your medications, try taking only a single pain pill, or even half a pain pill at a time.  You may take up to 10 pain pills per day, and you can also use benadryl over the counter for itching or also to help with sleep.   Precautions:  If you experience chest pain or shortness of breath - call 911 immediately for transfer to the hospital emergency department!!  If you develop a fever greater that 101 F, purulent drainage from wound, increased redness or drainage from wound, or calf pain-Call [place]                                              Follow- Up Appointment:  Please call for an appointment to be seen in 2 weeks at [facility]

## 2020-09-11 NOTE — Progress Notes (Signed)
  Subjective: 1 Day Post-Op Procedure(s) (LRB): REVERSE SHOULDER ARTHROPLASTY (Left) Patient reports pain as mild.   Patient is well, and has had no acute complaints or problems Plan is to go Home after hospital stay. Negative for chest pain and shortness of breath. Patient requires 3L of O2 while sleeping and does use supplemental O2 when ambulating as well. Fever: no Gastrointestinal:Negative for nausea and vomiting this AM.  Does report some mild nausea last night.  Objective: Vital signs in last 24 hours: Temp:  [98 F (36.7 C)-99.3 F (37.4 C)] 98.3 F (36.8 C) (06/01 0746) Pulse Rate:  [92-108] 99 (06/01 0746) Resp:  [10-18] 16 (06/01 0746) BP: (135-156)/(70-83) 143/73 (06/01 0746) SpO2:  [97 %-100 %] 97 % (06/01 0746)  Intake/Output from previous day:  Intake/Output Summary (Last 24 hours) at 09/11/2020 0752 Last data filed at 09/10/2020 1500 Gross per 24 hour  Intake 1076.86 ml  Output --  Net 1076.86 ml    Intake/Output this shift: No intake/output data recorded.  Labs: No results for input(s): HGB in the last 72 hours. No results for input(s): WBC, RBC, HCT, PLT in the last 72 hours. No results for input(s): NA, K, CL, CO2, BUN, CREATININE, GLUCOSE, CALCIUM in the last 72 hours. No results for input(s): LABPT, INR in the last 72 hours.   EXAM General - Patient is Alert, Appropriate and Oriented  Auscultation: Wheeze to bilateral upper lobes. Extremity - ABD soft Sensation intact distally Incision: dressing C/D/I  Intact to light touch to the left arm. Radial pulse intact to the left arm. Able to flex and extend fingers and wrist without pain. Dressing/Incision - clean, dry, no drainage Motor Function - intact, moving foot and toes well on exam. Abdomen soft with intact bowel sounds.  Past Medical History:  Diagnosis Date  . Aortic atherosclerosis ([place])   . Arthritis   . Complication of anesthesia    difficulty waking up  . COPD (chronic obstructive  pulmonary disease) ([place])   . Coronary artery disease   . Dyspnea   . Emphysema of lung ([facility])    SEVERE  . GERD (gastroesophageal reflux disease)   . Headache    H/O  . History of hiatal hernia   . Hyperlipidemia   . Hypertension    H/O OFF MEDS SINCE 2020 BY PCP  . Osteoporosis   . Oxygen deficiency    uses O2 at night  . Oxygen dependent   . Pneumonia   . Umbilical hernia     Assessment/Plan: 1 Day Post-Op Procedure(s) (LRB): REVERSE SHOULDER ARTHROPLASTY (Left) Active Problems:   Status post reverse arthroplasty of shoulder, left  Estimated body mass index is 25.24 kg/m as calculated from the following:   Height as of this encounter: 5\' 2"  (1.575 m).   Weight as of this encounter: 62.6 kg. Advance diet Up with therapy D/C IV fluids when tolerating po intake.  Vitals reviewed. Patient on 3L of O2, which is chronic for her, continue to use as needed. Up with therapy today.   Patient is passing gas without pain. Plan for discharge home this afternoon pending progress with PT.  DVT Prophylaxis - Lovenox, Foot Pumps and TED hose Non-weightbearing to the left arm.  Raquel Seda Kronberg, PA-C [facility] 09/11/2020, 7:52 AM

## 2020-09-11 NOTE — Plan of Care (Signed)
  Problem: Education: Goal: Knowledge of General Education information will improve Description: Including pain rating scale, medication(s)/side effects and non-pharmacologic comfort measures Outcome: Progressing   Problem: Health Behavior/Discharge Planning: Goal: Ability to manage health-related needs will improve Outcome: Progressing   Problem: Clinical Measurements: Goal: Ability to maintain clinical measurements within normal limits will improve Outcome: Progressing Goal: Will remain free from infection Outcome: Progressing Goal: Diagnostic test results will improve Outcome: Progressing Goal: Respiratory complications will improve Outcome: Progressing Goal: Cardiovascular complication will be avoided Outcome: Progressing   Problem: Activity: Goal: Risk for activity intolerance will decrease Outcome: Progressing   Problem: Nutrition: Goal: Adequate nutrition will be maintained Outcome: Progressing   Problem: Coping: Goal: Level of anxiety will decrease Outcome: Progressing   Problem: Elimination: Goal: Will not experience complications related to bowel motility Outcome: Progressing Goal: Will not experience complications related to urinary retention Outcome: Progressing   Problem: Pain Managment: Goal: General experience of comfort will improve Outcome: Progressing   Problem: Safety: Goal: Ability to remain free from injury will improve Outcome: Progressing   Problem: Skin Integrity: Goal: Risk for impaired skin integrity will decrease Outcome: Progressing   Problem: Activity: Goal: Ability to avoid complications of mobility impairment will improve Outcome: Progressing Goal: Ability to tolerate increased activity will improve Outcome: Progressing   Problem: Education: Goal: Verbalization of understanding the information provided will improve Outcome: Progressing   Problem: Coping: Goal: Level of anxiety will decrease Outcome: Progressing   Problem:  Physical Regulation: Goal: Postoperative complications will be avoided or minimized Outcome: Progressing   Problem: Respiratory: Goal: Ability to maintain a clear airway will improve Outcome: Progressing   Problem: Pain Management: Goal: Pain level will decrease Outcome: Progressing   Problem: Skin Integrity: Goal: Signs of wound healing will improve Outcome: Progressing   Problem: Tissue Perfusion: Goal: Ability to maintain adequate tissue perfusion will improve Outcome: Progressing

## 2020-09-11 NOTE — Progress Notes (Signed)
Pt discharged home with family. All instructions provided with questions answered.  BP (!) 120/57 (BP Location: Right Arm)   Pulse (!) 116   Temp 98.4 F (36.9 C) (Oral)   Resp 16   Ht 5\' 2"  (1.575 m)   Wt 62.6 kg   SpO2 97%   BMI 25.24 kg/m    HR elevated at discharge, PA and MD both aware and ok to proceed; pt to follow up with PCP outpatient

## 2020-09-11 NOTE — Plan of Care (Signed)
Problem: Education: Goal: Knowledge of General Education information will improve Description: Including pain rating scale, medication(s)/side effects and non-pharmacologic comfort measures 09/11/2020 1354 by Jules Schick, RN Outcome: Completed/Met 09/11/2020 0821 by Jules Schick, RN Outcome: Progressing   Problem: Health Behavior/Discharge Planning: Goal: Ability to manage health-related needs will improve 09/11/2020 1354 by Jules Schick, RN Outcome: Completed/Met 09/11/2020 0821 by Jules Schick, RN Outcome: Progressing   Problem: Clinical Measurements: Goal: Ability to maintain clinical measurements within normal limits will improve 09/11/2020 1354 by Jules Schick, RN Outcome: Completed/Met 09/11/2020 0821 by Jules Schick, RN Outcome: Progressing Goal: Will remain free from infection 09/11/2020 1354 by Jules Schick, RN Outcome: Completed/Met 09/11/2020 0821 by Jules Schick, RN Outcome: Progressing Goal: Diagnostic test results will improve 09/11/2020 1354 by Jules Schick, RN Outcome: Completed/Met 09/11/2020 0821 by Jules Schick, RN Outcome: Progressing Goal: Respiratory complications will improve 09/11/2020 1354 by Jules Schick, RN Outcome: Completed/Met 09/11/2020 0821 by Jules Schick, RN Outcome: Progressing Goal: Cardiovascular complication will be avoided 09/11/2020 1354 by Jules Schick, RN Outcome: Completed/Met 09/11/2020 0821 by Jules Schick, RN Outcome: Progressing   Problem: Activity: Goal: Risk for activity intolerance will decrease 09/11/2020 1354 by Jules Schick, RN Outcome: Completed/Met 09/11/2020 0821 by Jules Schick, RN Outcome: Progressing   Problem: Nutrition: Goal: Adequate nutrition will be maintained 09/11/2020 1354 by Jules Schick, RN Outcome: Completed/Met 09/11/2020 0821 by Jules Schick, RN Outcome: Progressing   Problem: Coping: Goal: Level of anxiety will decrease 09/11/2020  1354 by Jules Schick, RN Outcome: Completed/Met 09/11/2020 0821 by Jules Schick, RN Outcome: Progressing   Problem: Elimination: Goal: Will not experience complications related to bowel motility 09/11/2020 1354 by Jules Schick, RN Outcome: Completed/Met 09/11/2020 0821 by Jules Schick, RN Outcome: Progressing Goal: Will not experience complications related to urinary retention 09/11/2020 1354 by Jules Schick, RN Outcome: Completed/Met 09/11/2020 0821 by Jules Schick, RN Outcome: Progressing   Problem: Pain Managment: Goal: General experience of comfort will improve 09/11/2020 1354 by Jules Schick, RN Outcome: Completed/Met 09/11/2020 0821 by Jules Schick, RN Outcome: Progressing   Problem: Safety: Goal: Ability to remain free from injury will improve 09/11/2020 1354 by Jules Schick, RN Outcome: Completed/Met 09/11/2020 0821 by Jules Schick, RN Outcome: Progressing   Problem: Skin Integrity: Goal: Risk for impaired skin integrity will decrease 09/11/2020 1354 by Jules Schick, RN Outcome: Completed/Met 09/11/2020 0821 by Jules Schick, RN Outcome: Progressing   Problem: Activity: Goal: Ability to avoid complications of mobility impairment will improve 09/11/2020 1354 by Jules Schick, RN Outcome: Completed/Met 09/11/2020 0821 by Jules Schick, RN Outcome: Progressing Goal: Ability to tolerate increased activity will improve 09/11/2020 1354 by Jules Schick, RN Outcome: Completed/Met 09/11/2020 0821 by Jules Schick, RN Outcome: Progressing   Problem: Education: Goal: Verbalization of understanding the information provided will improve 09/11/2020 1354 by Jules Schick, RN Outcome: Completed/Met 09/11/2020 0821 by Jules Schick, RN Outcome: Progressing   Problem: Coping: Goal: Level of anxiety will decrease 09/11/2020 1354 by Jules Schick, RN Outcome: Completed/Met 09/11/2020 0821 by Jules Schick,  RN Outcome: Progressing   Problem: Physical Regulation: Goal: Postoperative complications will be avoided or minimized 09/11/2020 1354 by Jules Schick, RN Outcome: Completed/Met 09/11/2020 0821 by Jules Schick, RN Outcome: Progressing   Problem: Respiratory: Goal: Ability to maintain a clear airway will improve 09/11/2020 1354 by Jules Schick, RN Outcome: Completed/Met 09/11/2020 0821 by Jules Schick, RN Outcome: Progressing   Problem: Pain Management: Goal: Pain level will decrease 09/11/2020 1354 by Jules Schick, RN Outcome: Completed/Met 09/11/2020 0821 by Jules Schick, RN Outcome: Progressing  Problem: Skin Integrity: Goal: Signs of wound healing will improve 09/11/2020 1354 by Jules Schick, RN Outcome: Completed/Met 09/11/2020 0821 by Jules Schick, RN Outcome: Progressing   Problem: Tissue Perfusion: Goal: Ability to maintain adequate tissue perfusion will improve 09/11/2020 1354 by Jules Schick, RN Outcome: Completed/Met 09/11/2020 0821 by Jules Schick, RN Outcome: Progressing

## 2020-09-11 NOTE — Discharge Summary (Signed)
Physician Discharge Summary  Patient ID: Joyce Rodriguez MRN: [id number] DOB/AGE: [birth year] 75 y.o.  Admit date: 09/10/2020 Discharge date: 09/11/2020  Admission Diagnoses:  Status post reverse arthroplasty of shoulder, left [Z96.612]  Discharge Diagnoses: Patient Active Problem List   Diagnosis Date Noted  . Status post reverse arthroplasty of shoulder, left 09/10/2020  . Obstructive chronic bronchitis with exacerbation ([place]) 01/30/2019  . Oxygen dependent 01/30/2019  . S/P Nissen fundoplication (with gastrostomy tube placement) ([place]) 10/19/2017  . CAD (coronary artery disease), native coronary artery 10/18/2017  . Hiatal hernia 10/15/2017  . Abnormal EKG 10/15/2017  . Dysphagia   . Schatzki's ring   . Gastric erosion   . Advanced care planning/counseling discussion 04/21/2017  . Acute on chronic respiratory failure with hypoxia ([place]) 05/08/2016  . COPD exacerbation ([place]) 05/08/2016  . Hyperglycemia 05/08/2016  . Dyspnea 05/06/2016  . Encounter for hepatitis C screening test for low risk patient 03/19/2016  . Essential hypertension 03/19/2015  . COPD (chronic obstructive pulmonary disease) ([place]) 03/19/2015  . Hyperlipidemia 03/19/2015  . Osteoarthritis of both hands 03/19/2015  . Osteoporosis 03/19/2015  . Acid reflux 03/19/2015  . Major depression, chronic 09/12/2014    Past Medical History:  Diagnosis Date  . Aortic atherosclerosis ([place])   . Arthritis   . Complication of anesthesia    difficulty waking up  . COPD (chronic obstructive pulmonary disease) ([place])   . Coronary artery disease   . Dyspnea   . Emphysema of lung ([facility])    SEVERE  . GERD (gastroesophageal reflux disease)   . Headache    H/O  . History of hiatal hernia   . Hyperlipidemia   . Hypertension    H/O OFF MEDS SINCE 2020 BY PCP  . Osteoporosis   . Oxygen deficiency    uses O2 at night  . Oxygen dependent   . Pneumonia   . Umbilical hernia      Transfusion: None.   Consultants (if any):    Discharged Condition: Improved  Hospital Course: Joyce Rodriguez is an 75 y.o. 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    Surgeries: Procedure(s): REVERSE SHOULDER ARTHROPLASTY on 09/10/2020 Patient tolerated the surgery well. Taken to PACU where she was stabilized and then transferred to the orthopedic floor.  Started on Lovenox 40 q 24 hrs. Foot pumps applied bilaterally at 80 mm. Heels elevated on bed with rolled towels. No evidence of DVT. Negative [place]. Physical therapy started on day #1 for gait training and transfer. OT started day #1 for ADL and assisted devices.  Patient's IV was removed on POD1  Implants: All press-fit Integra system with a 12 mm stem, a large metaphyseal body, a + mm humeral platform, a mini baseplate, and a 38 mm concentric +2 mm laterally offset glenosphere.  She was given perioperative antibiotics:  Anti-infectives (From admission, onward)   Start     Dose/Rate Route Frequency Ordered Stop   09/10/20 1400  ceFAZolin (ANCEF) IVPB 2g/100 mL premix        2 g 200 mL/hr over 30 Minutes Intravenous Every 6 hours 09/10/20 1041 09/11/20 0158   09/10/20 0624  ceFAZolin (ANCEF) 2-4 GM/100ML-% IVPB       Note to Pharmacy: Joyce Rodriguez   : cabinet override      09/10/20 0624 09/10/20 0741   09/10/20 0600  ceFAZolin (ANCEF) IVPB 2g/100 mL premix        2 g 200 mL/hr over 30 Minutes Intravenous On call to O.R. 09/10/20 1610 09/10/20 0740    .  She was given sequential compression devices, early ambulation, and Lovenox for DVT prophylaxis.  She benefited maximally from the hospital stay and there were no complications.    Recent vital signs:  Vitals:   09/11/20 0746 09/11/20 1135  BP: (!) 143/73 (!) 147/70  Pulse: 99 (!) 110  Resp: 16 16  Temp: 98.3 F (36.8 C) 98 F  (36.7 C)  SpO2: 97% 98%    Recent laboratory studies:  Lab Results  Component Value Date   HGB 12.6 09/11/2020   HGB 15.0 09/02/2020   HGB 13.0 01/23/2019   Lab Results  Component Value Date   WBC 14.3 (H) 09/11/2020   PLT 279 09/11/2020   No results found for: INR Lab Results  Component Value Date   NA 136 09/11/2020   K 3.7 09/11/2020   CL 101 09/11/2020   CO2 26 09/11/2020   BUN 7 (L) 09/11/2020   CREATININE 0.55 09/11/2020   GLUCOSE 125 (H) 09/11/2020    Discharge Medications:   Allergies as of 09/11/2020      Reactions   Codeine Nausea And Vomiting   Pt states that she can tolerate Tramadol fine.   Lipitor [atorvastatin Calcium] Other (See Comments)   Myalgias      Medication List    STOP taking these medications   Goodys Extra Strength 500-325-65 MG Pack Generic drug: Aspirin-Acetaminophen-Caffeine     TAKE these medications   acetaminophen 500 MG tablet Commonly known as: TYLENOL Take 1,000 mg by mouth every 6 (six) hours as needed for moderate pain.   albuterol 108 (90 Base) MCG/ACT inhaler Commonly known as: VENTOLIN HFA Inhale 2 puffs into the lungs every 6 (six) hours as needed. What changed: reasons to take this   aspirin EC 325 MG tablet Take 1 tablet (325 mg total) by mouth daily.   cholecalciferol 1000 units tablet Commonly known as: VITAMIN D Take 1,000 Units by mouth daily.   escitalopram 10 MG tablet Commonly known as: LEXAPRO Take 1 tablet (10 mg total) by mouth daily. What changed: when to take this   ezetimibe 10 MG tablet Commonly known as: ZETIA Take 10 mg by mouth every morning.   fluticasone-salmeterol 250-50 MCG/ACT Aepb Commonly known as: ADVAIR Inhale 1 puff into the lungs in the morning and at bedtime.   multivitamin with minerals Tabs tablet Take 1 tablet by mouth daily.   omeprazole 20 MG capsule Commonly known as: PRILOSEC Take 20 mg by mouth daily as needed (heartburn).   ondansetron 4 MG tablet Commonly  known as: ZOFRAN Take 1 tablet (4 mg total) by mouth every 6 (six) hours as needed for nausea.   oxyCODONE 5 MG immediate release tablet Commonly known as: Oxy IR/ROXICODONE Take 1-2 tablets (5-10 mg total) by mouth every 4 (four) hours as needed for severe pain.   OXYGEN Inhale 2.5-3 L into the lungs at bedtime. 3 L portable   potassium chloride 10 MEQ tablet Commonly known as: KLOR-CON Take 1 tablet (10 mEq total) by mouth daily.   Spiriva HandiHaler 18 MCG inhalation capsule Generic drug: tiotropium PLACE 1 CAPSULE INTO INHALER AND INHALE DAILY What changed: See the new instructions.   STOOL SOFTENER PO Take 2 tablets by mouth daily.   traMADol 50 MG tablet Commonly known as: ULTRAM Take 1-2  tablets (50-100 mg total) by mouth every 6 (six) hours as needed for moderate pain.   zinc gluconate 50 MG tablet Take 50 mg by mouth daily.       Diagnostic Studies: DG Shoulder Left Port  Result Date: 09/10/2020 CLINICAL DATA:  Postop LEFT shoulder surgery EXAM: LEFT SHOULDER COMPARISON:  None FINDINGS: Osseous demineralization. AC joint alignment normal. Reverse LEFT shoulder arthroplasty components in expected position. No fracture, dislocation, or bone destruction. Visualized ribs intact. IMPRESSION: Post LEFT reverse shoulder arthroplasty. No acute abnormalities. Electronically Signed   By: Lavonia Dana M.D.   On: 09/10/2020 10:36   Disposition: Plan for discharge home this afternoon pending progress with PT.   Follow-up Information    Lattie Corns, PA-C Follow up in 14 day(s).   Specialty: Physician Assistant Why: Joyce Rodriguez information: [place] [address] [phone]              Signed: Judson Roch PA-C 09/11/2020, 12:49 PM

## 2020-09-11 NOTE — Evaluation (Signed)
Occupational Therapy Evaluation Patient Details Name: Joyce Rodriguez MRN: [id number] DOB: 1946/01/14 Today's Date: 09/11/2020    History of Present Illness Patient is a 75 year old female with massive irreparable rotator cuff tear with cuff arthropathy in left shoulder. Patient had irreparable rotator cuff tear with cuff arthropathy 09/10/20. Past medical history of COPD, oxygen dependent, hypertension, depression, GERD   Clinical Impression   Ms Gaughran was seen for an OT evaluation this date. Pt lives with husband available 24/7. Prior to surgery, pt was active and independent. Pt has orders for LUE to be immobilized and will be NWBing per MD. Patient presents with impaired strength/ROM, pain, and sensation to LUE. These impairments result in a decreased ability to perform self care tasks requiring MAX A don/doff sling, polar care, and shirt. MIN A don underwear/pants. CGA for toilet t/f and perihygiene, MIN A clothing mgmt - assist pulling up over rear.  Pt and family instructed in polar care mgt, compression stockings mgt, sling/immobilizer mgt, ROM exercises for LUE (with instructions for no shoulder exercises until full sensation has returned), LUE precautions, adaptive strategies for bathing/dressing/toileting/grooming, positioning and considerations for sleep, and home/routines modifications to maximize falls prevention, safety, and independence. Handout provided. OT adjusted sling/immobilizer and polar care to improve comfort, optimize positioning, and to maximize skin integrity/safety. Pt verbalized understanding of all education/training provided. Pt will benefit from skilled OT services to address these limitations and improve independence in daily tasks.        Follow Up Recommendations  Follow surgeon's recommendation for DC plan and follow-up therapies    Equipment Recommendations  None recommended by OT    Recommendations for Other Services       Precautions / Restrictions  Precautions Precautions: Fall;Shoulder Shoulder Interventions: Shoulder sling/immobilizer;Shoulder abduction pillow;At all times;Off for dressing/bathing/exercises Precaution Booklet Issued: No Precaution Comments: shoulder immobilizer with an abduction pillow Restrictions Weight Bearing Restrictions: Yes LUE Weight Bearing: Non weight bearing      Mobility Bed Mobility Overal bed mobility: Needs Assistance Bed Mobility: Supine to Sit;Sit to Supine     Supine to sit: Min assist;HOB elevated Sit to supine: Min guard   General bed mobility comments: assist to come to EOB    Transfers Overall transfer level: Needs assistance Equipment used: 1 person hand held assist Transfers: Sit to/from Stand Sit to Stand: Min guard         General transfer comment: SBA + HHA    Balance Overall balance assessment: Needs assistance Sitting-balance support: Feet supported Sitting balance-Leahy Scale: Good     Standing balance support: During functional activity;No upper extremity supported Standing balance-Leahy Scale: Fair Standing balance comment: rails to stand from toilet                           ADL either performed or assessed with clinical judgement   ADL Overall ADL's : Needs assistance/impaired                                       General ADL Comments: MAX A don/doff sling, polar care, and shirt. MIN A don underwear/pants. MIN A toileting - assist pulling up over rear.                  Pertinent Vitals/Pain Pain Assessment: Faces Faces Pain Scale: Hurts a little bit Pain Location: left shouulder Pain Descriptors / Indicators: Discomfort;Sore;Burning  Pain Intervention(s): Limited activity within patient's tolerance;Premedicated before session;Repositioned     Hand Dominance Right   Extremity/Trunk Assessment Upper Extremity Assessment Upper Extremity Assessment: Overall WFL for tasks assessed;LUE deficits/detail LUE: Unable to  fully assess due to immobilization   Lower Extremity Assessment Lower Extremity Assessment: Generalized weakness       Communication Communication Communication: No difficulties   Cognition Arousal/Alertness: Awake/alert Behavior During Therapy: WFL for tasks assessed/performed Overall Cognitive Status: Within Functional Limits for tasks assessed                                 General Comments:  (pt is A and O x 4)   General Comments       Exercises Exercises: Other exercises Other Exercises Other Exercises: Pt and family educated re: OT role, DME recs, d/c recs, falls prevention, ECS, polar care mgmt, adapted dressing/bathing techniques Other Exercises: LBD, UBD, don/doff sling/polar care, toileting, sit<>stand, sup<>sit, sitting/standing balance/tolerance   Shoulder Instructions      Home Living Family/patient expects to be discharged to:: Private residence Living Arrangements: Spouse/significant other Available Help at Discharge: Family;Available 24 hours/day Type of Home: House Home Access: Stairs to enter [employer] of Steps: 2         Bathroom Shower/Tub: Tub/shower unit         Home Equipment: Shower seat   Additional Comments: 3 L02 at baseline, PRN during the day and all the time at night      Prior Functioning/Environment Level of Independence: Independent        Comments: Patient was independent with mobility and ADLs at baseline        OT Problem List: Decreased strength;Decreased activity tolerance;Impaired balance (sitting and/or standing);Impaired UE functional use      OT Treatment/Interventions: Self-care/ADL training;Therapeutic exercise;Energy conservation;DME and/or AE instruction;Therapeutic activities;Balance training;Patient/family education    OT Goals(Current goals can be found in the care plan section) Acute Rehab OT Goals Patient Stated Goal: home OT Goal Formulation: With patient/family Time For  Goal Achievement: 09/25/20 Potential to Achieve Goals: Good ADL Goals Pt Will Perform Grooming: Independently;standing Pt Will Perform Upper Body Dressing: with min assist;sitting Pt Will Perform Toileting - Clothing Manipulation and hygiene: with modified independence;sit to/from stand  OT Frequency: Min 1X/week    AM-PAC OT "6 Clicks" Daily Activity     Outcome Measure Help from another person eating meals?: A Little Help from another person taking care of personal grooming?: A Little Help from another person toileting, which includes using toliet, bedpan, or urinal?: A Little Help from another person bathing (including washing, rinsing, drying)?: A Little Help from another person to put on and taking off regular upper body clothing?: A Little Help from another person to put on and taking off regular lower body clothing?: A Little 6 Click Score: 18   End of Session    Activity Tolerance: Patient tolerated treatment well Patient left: in bed;with call bell/phone within reach;with family/visitor present  OT Visit Diagnosis: Muscle weakness (generalized) (M62.81)                Time: 5916-3846 OT Time Calculation (min): 33 min Charges:  OT General Charges $OT Visit: 1 Visit OT Evaluation $OT Eval Moderate Complexity: 1 Mod OT Treatments $Self Care/Home Management : 23-37 mins  Dessie Coma, M.S. OTR/L  09/11/20, 1:52 PM  ascom [phone]

## 2020-09-12 DIAGNOSIS — Z96612 Presence of left artificial shoulder joint: Secondary | ICD-10-CM | POA: Diagnosis not present

## 2020-09-12 DIAGNOSIS — K219 Gastro-esophageal reflux disease without esophagitis: Secondary | ICD-10-CM | POA: Diagnosis not present

## 2020-09-12 DIAGNOSIS — M19041 Primary osteoarthritis, right hand: Secondary | ICD-10-CM | POA: Diagnosis not present

## 2020-09-12 DIAGNOSIS — Z9049 Acquired absence of other specified parts of digestive tract: Secondary | ICD-10-CM | POA: Diagnosis not present

## 2020-09-12 DIAGNOSIS — M19042 Primary osteoarthritis, left hand: Secondary | ICD-10-CM | POA: Diagnosis not present

## 2020-09-12 DIAGNOSIS — Z9181 History of falling: Secondary | ICD-10-CM | POA: Diagnosis not present

## 2020-09-12 DIAGNOSIS — M810 Age-related osteoporosis without current pathological fracture: Secondary | ICD-10-CM | POA: Diagnosis not present

## 2020-09-12 DIAGNOSIS — Z9981 Dependence on supplemental oxygen: Secondary | ICD-10-CM | POA: Diagnosis not present

## 2020-09-12 DIAGNOSIS — R739 Hyperglycemia, unspecified: Secondary | ICD-10-CM | POA: Diagnosis not present

## 2020-09-12 DIAGNOSIS — Z7951 Long term (current) use of inhaled steroids: Secondary | ICD-10-CM | POA: Diagnosis not present

## 2020-09-12 DIAGNOSIS — Z8701 Personal history of pneumonia (recurrent): Secondary | ICD-10-CM | POA: Diagnosis not present

## 2020-09-12 DIAGNOSIS — Z87891 Personal history of nicotine dependence: Secondary | ICD-10-CM | POA: Diagnosis not present

## 2020-09-12 DIAGNOSIS — Q393 Congenital stenosis and stricture of esophagus: Secondary | ICD-10-CM | POA: Diagnosis not present

## 2020-09-12 DIAGNOSIS — Z7982 Long term (current) use of aspirin: Secondary | ICD-10-CM | POA: Diagnosis not present

## 2020-09-12 DIAGNOSIS — I2510 Atherosclerotic heart disease of native coronary artery without angina pectoris: Secondary | ICD-10-CM | POA: Diagnosis not present

## 2020-09-12 DIAGNOSIS — Z471 Aftercare following joint replacement surgery: Secondary | ICD-10-CM | POA: Diagnosis not present

## 2020-09-12 DIAGNOSIS — R1310 Dysphagia, unspecified: Secondary | ICD-10-CM | POA: Diagnosis not present

## 2020-09-12 DIAGNOSIS — I10 Essential (primary) hypertension: Secondary | ICD-10-CM | POA: Diagnosis not present

## 2020-09-12 DIAGNOSIS — F329 Major depressive disorder, single episode, unspecified: Secondary | ICD-10-CM | POA: Diagnosis not present

## 2020-09-12 DIAGNOSIS — E785 Hyperlipidemia, unspecified: Secondary | ICD-10-CM | POA: Diagnosis not present

## 2020-09-12 DIAGNOSIS — Z90710 Acquired absence of both cervix and uterus: Secondary | ICD-10-CM | POA: Diagnosis not present

## 2020-09-12 DIAGNOSIS — J9621 Acute and chronic respiratory failure with hypoxia: Secondary | ICD-10-CM | POA: Diagnosis not present

## 2020-09-12 DIAGNOSIS — K429 Umbilical hernia without obstruction or gangrene: Secondary | ICD-10-CM | POA: Diagnosis not present

## 2020-09-12 DIAGNOSIS — J439 Emphysema, unspecified: Secondary | ICD-10-CM | POA: Diagnosis not present

## 2020-09-12 LAB — SURGICAL PATHOLOGY

## 2020-09-13 DIAGNOSIS — Z471 Aftercare following joint replacement surgery: Secondary | ICD-10-CM | POA: Diagnosis not present

## 2020-09-17 DIAGNOSIS — M25712 Osteophyte, left shoulder: Secondary | ICD-10-CM | POA: Diagnosis not present

## 2020-09-17 DIAGNOSIS — M19012 Primary osteoarthritis, left shoulder: Secondary | ICD-10-CM | POA: Diagnosis not present

## 2020-09-17 DIAGNOSIS — Z01818 Encounter for other preprocedural examination: Secondary | ICD-10-CM | POA: Diagnosis not present

## 2020-09-17 DIAGNOSIS — I700 Atherosclerosis of aorta: Secondary | ICD-10-CM | POA: Diagnosis not present

## 2020-09-18 DIAGNOSIS — Z09 Encounter for follow-up examination after completed treatment for conditions other than malignant neoplasm: Secondary | ICD-10-CM | POA: Diagnosis not present

## 2020-09-18 DIAGNOSIS — I10 Essential (primary) hypertension: Secondary | ICD-10-CM | POA: Diagnosis not present

## 2020-09-18 DIAGNOSIS — J431 Panlobular emphysema: Secondary | ICD-10-CM | POA: Diagnosis not present

## 2020-09-18 DIAGNOSIS — R000 Tachycardia, unspecified: Secondary | ICD-10-CM | POA: Diagnosis not present

## 2020-09-20 DIAGNOSIS — R1310 Dysphagia, unspecified: Secondary | ICD-10-CM | POA: Diagnosis not present

## 2020-09-20 DIAGNOSIS — F329 Major depressive disorder, single episode, unspecified: Secondary | ICD-10-CM | POA: Diagnosis not present

## 2020-09-20 DIAGNOSIS — I10 Essential (primary) hypertension: Secondary | ICD-10-CM | POA: Diagnosis not present

## 2020-09-20 DIAGNOSIS — Z90710 Acquired absence of both cervix and uterus: Secondary | ICD-10-CM | POA: Diagnosis not present

## 2020-09-20 DIAGNOSIS — K219 Gastro-esophageal reflux disease without esophagitis: Secondary | ICD-10-CM | POA: Diagnosis not present

## 2020-09-20 DIAGNOSIS — M19042 Primary osteoarthritis, left hand: Secondary | ICD-10-CM | POA: Diagnosis not present

## 2020-09-20 DIAGNOSIS — M19041 Primary osteoarthritis, right hand: Secondary | ICD-10-CM | POA: Diagnosis not present

## 2020-09-20 DIAGNOSIS — K429 Umbilical hernia without obstruction or gangrene: Secondary | ICD-10-CM | POA: Diagnosis not present

## 2020-09-20 DIAGNOSIS — E785 Hyperlipidemia, unspecified: Secondary | ICD-10-CM | POA: Diagnosis not present

## 2020-09-20 DIAGNOSIS — Z96612 Presence of left artificial shoulder joint: Secondary | ICD-10-CM | POA: Diagnosis not present

## 2020-09-20 DIAGNOSIS — Z8701 Personal history of pneumonia (recurrent): Secondary | ICD-10-CM | POA: Diagnosis not present

## 2020-09-20 DIAGNOSIS — Z9981 Dependence on supplemental oxygen: Secondary | ICD-10-CM | POA: Diagnosis not present

## 2020-09-20 DIAGNOSIS — R739 Hyperglycemia, unspecified: Secondary | ICD-10-CM | POA: Diagnosis not present

## 2020-09-20 DIAGNOSIS — I2510 Atherosclerotic heart disease of native coronary artery without angina pectoris: Secondary | ICD-10-CM | POA: Diagnosis not present

## 2020-09-20 DIAGNOSIS — Z7982 Long term (current) use of aspirin: Secondary | ICD-10-CM | POA: Diagnosis not present

## 2020-09-20 DIAGNOSIS — Z87891 Personal history of nicotine dependence: Secondary | ICD-10-CM | POA: Diagnosis not present

## 2020-09-20 DIAGNOSIS — Z9049 Acquired absence of other specified parts of digestive tract: Secondary | ICD-10-CM | POA: Diagnosis not present

## 2020-09-20 DIAGNOSIS — J9621 Acute and chronic respiratory failure with hypoxia: Secondary | ICD-10-CM | POA: Diagnosis not present

## 2020-09-20 DIAGNOSIS — Z471 Aftercare following joint replacement surgery: Secondary | ICD-10-CM | POA: Diagnosis not present

## 2020-09-20 DIAGNOSIS — Z9181 History of falling: Secondary | ICD-10-CM | POA: Diagnosis not present

## 2020-09-20 DIAGNOSIS — Z7951 Long term (current) use of inhaled steroids: Secondary | ICD-10-CM | POA: Diagnosis not present

## 2020-09-20 DIAGNOSIS — J439 Emphysema, unspecified: Secondary | ICD-10-CM | POA: Diagnosis not present

## 2020-09-20 DIAGNOSIS — M810 Age-related osteoporosis without current pathological fracture: Secondary | ICD-10-CM | POA: Diagnosis not present

## 2020-09-20 DIAGNOSIS — Q393 Congenital stenosis and stricture of esophagus: Secondary | ICD-10-CM | POA: Diagnosis not present

## 2020-10-18 DIAGNOSIS — T466X5A Adverse effect of antihyperlipidemic and antiarteriosclerotic drugs, initial encounter: Secondary | ICD-10-CM | POA: Diagnosis not present

## 2020-10-18 DIAGNOSIS — G720 Drug-induced myopathy: Secondary | ICD-10-CM | POA: Diagnosis not present

## 2020-10-18 DIAGNOSIS — R7309 Other abnormal glucose: Secondary | ICD-10-CM | POA: Diagnosis not present

## 2020-10-18 DIAGNOSIS — J431 Panlobular emphysema: Secondary | ICD-10-CM | POA: Diagnosis not present

## 2020-10-18 DIAGNOSIS — Z79899 Other long term (current) drug therapy: Secondary | ICD-10-CM | POA: Diagnosis not present

## 2020-10-18 DIAGNOSIS — E782 Mixed hyperlipidemia: Secondary | ICD-10-CM | POA: Diagnosis not present

## 2020-10-18 DIAGNOSIS — F3341 Major depressive disorder, recurrent, in partial remission: Secondary | ICD-10-CM | POA: Diagnosis not present

## 2020-10-18 DIAGNOSIS — Z0000 Encounter for general adult medical examination without abnormal findings: Secondary | ICD-10-CM | POA: Diagnosis not present

## 2020-10-18 DIAGNOSIS — I10 Essential (primary) hypertension: Secondary | ICD-10-CM | POA: Diagnosis not present

## 2020-10-25 DIAGNOSIS — Z96612 Presence of left artificial shoulder joint: Secondary | ICD-10-CM | POA: Diagnosis not present

## 2020-11-05 ENCOUNTER — Other Ambulatory Visit: Payer: Self-pay

## 2020-11-05 ENCOUNTER — Encounter: Payer: Self-pay | Admitting: Internal Medicine

## 2020-11-05 ENCOUNTER — Ambulatory Visit: Payer: PPO | Admitting: Internal Medicine

## 2020-11-05 VITALS — BP 140/72 | HR 95 | Temp 97.4°F | Resp 16 | Ht 62.0 in | Wt 130.0 lb

## 2020-11-05 DIAGNOSIS — J9611 Chronic respiratory failure with hypoxia: Secondary | ICD-10-CM

## 2020-11-05 DIAGNOSIS — Z9981 Dependence on supplemental oxygen: Secondary | ICD-10-CM | POA: Diagnosis not present

## 2020-11-05 DIAGNOSIS — J449 Chronic obstructive pulmonary disease, unspecified: Secondary | ICD-10-CM | POA: Diagnosis not present

## 2020-11-05 DIAGNOSIS — R0602 Shortness of breath: Secondary | ICD-10-CM

## 2020-11-05 MED ORDER — PREDNISONE 10 MG (21) PO TBPK
ORAL_TABLET | ORAL | 0 refills | Status: DC
Start: 2020-11-05 — End: 2020-11-06

## 2020-11-05 MED ORDER — AZITHROMYCIN 250 MG PO TABS: ORAL_TABLET | ORAL | 0 refills | Status: AC

## 2020-11-05 NOTE — Patient Instructions (Signed)

## 2020-11-05 NOTE — Progress Notes (Signed)
[facility] [place], [place] [address]  Pulmonary Sleep Medicine   Office Visit Note  Patient Name: Joyce Rodriguez DOB: [birth year] MRN [id number]  Date of Service: 11/05/2020  Complaints/HPI: COPD Oxygen dependent.  She states that overall she has done well only recently has she had some increasing shortness of breath she is also a little bit of a cough and some tightness in her chest.  She was concerned that her breathing may be getting worse and so therefore requested to be seen.  On evaluation she is not labored.  She does not sound particularly coarse or congested at this time.  She has not had any admissions to the hospital.  She did have shoulder surgery done which actually went well  ROS  General: (-) fever, (-) chills, (-) night sweats, (-) weakness Skin: (-) rashes, (-) itching,. Eyes: (-) visual changes, (-) redness, (-) itching. Nose and Sinuses: (-) nasal stuffiness or itchiness, (-) postnasal drip, (-) nosebleeds, (-) sinus trouble. Mouth and Throat: (-) sore throat, (-) hoarseness. Neck: (-) swollen glands, (-) enlarged thyroid, (-) neck pain. Respiratory: + cough, (-) bloody sputum, + shortness of breath, - wheezing. Cardiovascular: - ankle swelling, (-) chest pain. Lymphatic: (-) lymph node enlargement. Neurologic: (-) numbness, (-) tingling. Psychiatric: (-) anxiety, (-) depression   Current Medication: Outpatient Encounter Medications as of 11/05/2020  Medication Sig   acetaminophen (TYLENOL) 500 MG tablet Take 1,000 mg by mouth every 6 (six) hours as needed for moderate pain.   albuterol (VENTOLIN HFA) 108 (90 Base) MCG/ACT inhaler Inhale 2 puffs into the lungs every 6 (six) hours as needed. (Patient taking differently: Inhale 2 puffs into the lungs every 6 (six) hours as needed for wheezing or shortness of breath.)   aspirin EC 325 MG tablet Take 1 tablet (325 mg total) by mouth daily.   cholecalciferol (VITAMIN D) 1000 units tablet Take  1,000 Units by mouth daily.   Docusate Calcium (STOOL SOFTENER PO) Take 2 tablets by mouth daily.   escitalopram (LEXAPRO) 10 MG tablet Take 1 tablet (10 mg total) by mouth daily. (Patient taking differently: Take 10 mg by mouth every morning.)   ezetimibe (ZETIA) 10 MG tablet Take 10 mg by mouth every morning.   fluticasone-salmeterol (ADVAIR) 250-50 MCG/ACT AEPB Inhale 1 puff into the lungs in the morning and at bedtime.   Multiple Vitamin (MULTIVITAMIN WITH MINERALS) TABS tablet Take 1 tablet by mouth daily.   omeprazole (PRILOSEC) 20 MG capsule Take 20 mg by mouth daily as needed (heartburn).   ondansetron (ZOFRAN) 4 MG tablet Take 1 tablet (4 mg total) by mouth every 6 (six) hours as needed for nausea.   OXYGEN Inhale 2.5-3 L into the lungs at bedtime. 3 L portable   potassium chloride (K-DUR) 10 MEQ tablet Take 1 tablet (10 mEq total) by mouth daily.   propranolol ER (INDERAL LA) 60 MG 24 hr capsule Take 60 mg by mouth daily.   SPIRIVA HANDIHALER 18 MCG inhalation capsule PLACE 1 CAPSULE INTO INHALER AND INHALE DAILY (Patient taking differently: Place 18 mcg into inhaler and inhale every morning.)   traMADol (ULTRAM) 50 MG tablet Take 1-2 tablets (50-100 mg total) by mouth every 6 (six) hours as needed for moderate pain.   zinc gluconate 50 MG tablet Take 50 mg by mouth daily.   [DISCONTINUED] oxyCODONE (OXY IR/ROXICODONE) 5 MG immediate release tablet Take 1-2 tablets (5-10 mg total) by mouth every 4 (four) hours as needed for severe pain. (Patient  not taking: Reported on 11/05/2020)   No facility-administered encounter medications on file as of 11/05/2020.    Surgical History: Past Surgical History:  Procedure Laterality Date   ABDOMINAL HYSTERECTOMY     CHOLECYSTECTOMY     ESOPHAGEAL MANOMETRY N/A 09/13/2017   Procedure: ESOPHAGEAL MANOMETRY (EM);  Surgeon: Mauri Pole, MD;  Location: [facility] ENDOSCOPY;  Service: Endoscopy;  Laterality: N/A;   ESOPHAGOGASTRODUODENOSCOPY (EGD) WITH  PROPOFOL N/A 05/11/2017   Procedure: ESOPHAGOGASTRODUODENOSCOPY (EGD) WITH PROPOFOL;  Surgeon: Lin Landsman, MD;  Location[facility] ENDOSCOPY;  Service: Gastroenterology;  Laterality: N/A;   EYE SURGERY Bilateral    CATARACTS   FOOT SURGERY     MORTONS NEUROMA   LAPAROSCOPIC NISSEN FUNDOPLICATION N/A [id number]   Procedure: LAPAROSCOPIC NISSEN FUNDOPLICATION;  Surgeon: Jules Husbands, MD;  Location: [facility] ORS;  Service: General;  Laterality: N/A;   NISSEN FUNDOPLICATION N/A [id number]   Procedure: NISSEN FUNDOPLICATION;  Surgeon: Jules Husbands, MD;  Location: [facility] ORS;  Service: General;  Laterality: N/A;   REVERSE SHOULDER ARTHROPLASTY Left 09/10/2020   Procedure: REVERSE SHOULDER ARTHROPLASTY;  Surgeon: Corky Mull, MD;  Location: [facility] ORS;  Service: Orthopedics;  Laterality: Left;    Medical History: Past Medical History:  Diagnosis Date   Aortic atherosclerosis ([place])    Arthritis    Complication of anesthesia    difficulty waking up   COPD (chronic obstructive pulmonary disease) (HCC)    Coronary artery disease    Dyspnea    Emphysema of lung (HCC)    SEVERE   GERD (gastroesophageal reflux disease)    Headache    H/O   History of hiatal hernia    Hyperlipidemia    Hypertension    H/O OFF MEDS SINCE 2020 BY PCP   Osteoporosis    Oxygen deficiency    uses O2 at night   Oxygen dependent    Pneumonia    Umbilical hernia     Family History: Family History  Problem Relation Age of Onset   Ovarian cancer Mother    Emphysema Father    Melanoma Sister    Lung cancer Brother     Social History: Social History   Socioeconomic History   Marital status: Married    Spouse name: Not on file   Number of children: Not on file   Years of education: 12   Highest education level: 12th grade  Occupational History   Occupation: retired  Tobacco Use   Smoking status: Former    Packs/day: 1.50    Years: 30.00    Pack years: 45.00    Types: Cigarettes    Quit date:  03/18/2001    Years since quitting: 19.6   Smokeless tobacco: Never  Vaping Use   Vaping Use: Never used  Substance and Sexual Activity   Alcohol use: Yes    Comment: rare   Drug use: No   Sexual activity: Not Currently  Other Topics Concern   Not on file  Social History Narrative   Not on file   Social Determinants of Health   Financial Resource Strain: Not on file  Food Insecurity: Not on file  Transportation Needs: Not on file  Physical Activity: Not on file  Stress: Not on file  Social Connections: Not on file  Intimate Partner Violence: Not on file    Vital Signs: Blood pressure 140/72, pulse 95, temperature (!) 97.4 F (36.3 C), resp. rate 16, height '5\' 2"'$  (1.575 m), weight 130 lb (59 kg), SpO2 92 %.  Examination: General Appearance: The patient is well-developed, well-nourished, and in no distress. Skin: Gross inspection of skin unremarkable. Head: normocephalic, no gross deformities. Eyes: no gross deformities noted. ENT: ears appear grossly normal no exudates. Neck: Supple. No thyromegaly. No LAD. Respiratory: no rhonchi noted. Cardiovascular: Normal S1 and S2 without murmur or rub. Extremities: No cyanosis. pulses are equal. Neurologic: Alert and oriented. No involuntary movements.  LABS: Recent Results (from the past 2160 hour(s))  CBC WITH DIFFERENTIAL     Status: Abnormal   Collection Time: 09/02/20  1:40 PM  Result Value Ref Range   WBC 11.3 (H) 4.0 - 10.5 K/uL   RBC 4.80 3.87 - 5.11 MIL/uL   Hemoglobin 15.0 12.0 - 15.0 g/dL   HCT 45.0 36.0 - 46.0 %   MCV 93.8 80.0 - 100.0 fL   MCH 31.3 26.0 - 34.0 pg   MCHC 33.3 30.0 - 36.0 g/dL   RDW 13.2 11.5 - 15.5 %   Platelets 385 150 - 400 K/uL   nRBC 0.0 0.0 - 0.2 %   Neutrophils Relative % 71 %   Neutro Abs 8.0 (H) 1.7 - 7.7 K/uL   Lymphocytes Relative 17 %   Lymphs Abs 1.9 0.7 - 4.0 K/uL   Monocytes Relative 8 %   Monocytes Absolute 0.9 0.1 - 1.0 K/uL   Eosinophils Relative 3 %   Eosinophils  Absolute 0.3 0.0 - 0.5 K/uL   Basophils Relative 1 %   Basophils Absolute 0.1 0.0 - 0.1 K/uL   Immature Granulocytes 0 %   Abs Immature Granulocytes 0.05 0.00 - 0.07 K/uL    Comment: Performed at [facility], [address] [place], [place] [address]  Comprehensive metabolic panel     Status: Abnormal   Collection Time: 09/02/20  1:40 PM  Result Value Ref Range   Sodium 138 135 - 145 mmol/L   Potassium 3.6 3.5 - 5.1 mmol/L   Chloride 98 98 - 111 mmol/L   CO2 27 22 - 32 mmol/L   Glucose, Bld 100 (H) 70 - 99 mg/dL    Comment: Glucose reference range applies only to samples taken after fasting for at least 8 hours.   BUN 12 8 - 23 mg/dL   Creatinine, Ser 0.70 0.44 - 1.00 mg/dL   Calcium 10.2 8.9 - 10.3 mg/dL   Total Protein 7.6 6.5 - 8.1 g/dL   Albumin 4.3 3.5 - 5.0 g/dL   AST 24 15 - 41 U/L   ALT 14 0 - 44 U/L   Alkaline Phosphatase 57 38 - 126 U/L   Total Bilirubin 0.5 0.3 - 1.2 mg/dL   GFR, Estimated >60 >60 mL/min    Comment: (NOTE) Calculated using the CKD-EPI Creatinine Equation (2021)    Anion gap 13 5 - 15    Comment: Performed at [facility], [address] [place], [place] [address]  Urinalysis, Routine w reflex microscopic     Status: Abnormal   Collection Time: 09/02/20  1:40 PM  Result Value Ref Range   Color, Urine YELLOW (A) YELLOW   APPearance HAZY (A) CLEAR   Specific Gravity, Urine 1.021 1.005 - 1.030   pH 5.0 5.0 - 8.0   Glucose, UA NEGATIVE NEGATIVE mg/dL   Hgb urine dipstick NEGATIVE NEGATIVE   Bilirubin Urine NEGATIVE NEGATIVE   Ketones, ur 5 (A) NEGATIVE mg/dL   Protein, ur NEGATIVE NEGATIVE mg/dL   Nitrite NEGATIVE NEGATIVE   Leukocytes,Ua LARGE (A) NEGATIVE   RBC / HPF 0-5  0 - 5 RBC/hpf   WBC, UA >50 (H) 0 - 5 WBC/hpf   Bacteria, UA RARE (A) NONE SEEN   Squamous Epithelial / LPF 0-5 0 - 5   Mucus PRESENT     Comment: Performed at [facility], [address], [place], [place] [address]  Surgical pcr screen      Status: None   Collection Time: 09/02/20  1:40 PM   Specimen: Nasal Mucosa; Nasal Swab  Result Value Ref Range   MRSA, PCR NEGATIVE NEGATIVE   Staphylococcus aureus NEGATIVE NEGATIVE    Comment: (NOTE) The Xpert SA Assay (FDA approved for NASAL specimens in patients 91 years of age and older), is one component of a comprehensive surveillance program. It is not intended to diagnose infection nor to guide or monitor treatment. Performed at [facility], [address] [place], [place] [address]   Type and screen Order type and screen if day of surgery is less than 15 days from draw of preadmission visit or order morning of surgery if day of surgery is greater than 6 days from preadmission visit.     Status: None   Collection Time: 09/02/20  1:42 PM  Result Value Ref Range   ABO/RH(D) O POS    Antibody Screen NEG    Sample Expiration 09/16/2020,2359    Extend sample reason      NO TRANSFUSIONS OR PREGNANCY IN THE PAST 3 MONTHS Performed at [facility], [address], [place], [place] [address]   Urine Culture     Status: Abnormal   Collection Time: 09/02/20  3:58 PM   Specimen: Urine, Random  Result Value Ref Range   Specimen Description      URINE, RANDOM Performed at [facility], [address], [place], [place] [address]    Special Requests      NONE Performed at [facility], [address] [place], [place] [address]    Culture MULTIPLE SPECIES PRESENT, SUGGEST RECOLLECTION (A)    Report Status 09/04/2020 FINAL   SARS CORONAVIRUS 2 (TAT 6-24 HRS) Nasopharyngeal Nasopharyngeal Swab     Status: None   Collection Time: 09/05/20 10:15 AM   Specimen: Nasopharyngeal Swab  Result Value Ref Range   SARS Coronavirus 2 NEGATIVE NEGATIVE    Comment: (NOTE) SARS-CoV-2 target nucleic acids are NOT DETECTED.  The SARS-CoV-2 RNA is generally detectable in upper and lower respiratory specimens during the acute phase of infection.  Negative results do not preclude SARS-CoV-2 infection, do not rule out co-infections with other pathogens, and should not be used as the sole basis for treatment or other patient management decisions. Negative results must be combined with clinical observations, patient history, and epidemiological information. The expected result is Negative.  Fact Sheet for Patients: SugarRoll.be  Fact Sheet for Healthcare Providers: https://www.woods-mathews.com/  This test is not yet approved or cleared by the [place] FDA and  has been authorized for detection and/or diagnosis of SARS-CoV-2 by FDA under an Emergency Use Authorization (EUA). This EUA will remain  in effect (meaning this test can be used) for the duration of the COVID-19 declaration under Se ction 564(b)(1) of the Act, 21 U.S.C. section 360bbb-3(b)(1), unless the authorization is terminated or revoked sooner.  Performed at [facility], [place] [address] [place], [place] [address]   ABO/Rh     Status: None   Collection Time: 09/10/20  6:31 AM  Result Value Ref Range   ABO/RH(D)  O POS Performed at [facility], [address], [place], [place] [address]   Surgical pathology     Status: None   Collection Time: 09/10/20  8:39 AM  Result Value Ref Range   SURGICAL PATHOLOGY      SURGICAL PATHOLOGY CASE: [id number] PATIENT: [place] Surgical Pathology Report     Specimen Submitted: A. 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 HUMERAL HEAD, LEFT; ARTHROPLASTY: - DEGENERATIVE OSTEOARTHROPATHY. - NEGATIVE FOR MALIGNANCY.   GROSS DESCRIPTION: A. Labeled: Left humeral head Received: Formalin Collection time: 8:39 AM on 09/10/2020 Placed into formalin time: 9:23 AM on 09/10/2020 Size of specimen:      Head -5.4 x 5.1 x 1.6 cm      Neck  -None identified      Additional tissue: None identified Articular surface: The articular surface is tan-white and lobulated with a central 4 x 3.5 cm area of eburnation. Cut surface: The cut surface is tan-yellow and firm. Other findings: None grossly appreciated.  Block summary: 1 - representative sections of eburnation to adjacent cartilage  Tissue decalcification: Yes, 1 cassette  RB  09/10/2020  Final Diagnosis performed by Betsy Pries, MD.   Electronically signed 09/12/2020 10:55:03AM The electronic signature indicates that the named Attending Pathologist has evaluated the specimen Technical component performed at [facility], [address], [place], [place] [address] Lab: [phone] Dir: Rush Farmer, MD, MMM  Professional component performed at [facility], [facility], [address], [place], [place] [address] Lab: [phone] Dir: Dellia Nims. Rubinas, MD   Basic metabolic panel     Status: Abnormal   Collection Time: 09/11/20  8:44 AM  Result Value Ref Range   Sodium 136 135 - 145 mmol/L   Potassium 3.7 3.5 - 5.1 mmol/L   Chloride 101 98 - 111 mmol/L   CO2 26 22 - 32 mmol/L   Glucose, Bld 125 (H) 70 - 99 mg/dL    Comment: Glucose reference range applies only to samples taken after fasting for at least 8 hours.   BUN 7 (L) 8 - 23 mg/dL   Creatinine, Ser 0.55 0.44 - 1.00 mg/dL   Calcium 8.9 8.9 - 10.3 mg/dL   GFR, Estimated >60 >60 mL/min    Comment: (NOTE) Calculated using the CKD-EPI Creatinine Equation (2021)    Anion gap 9 5 - 15    Comment: Performed at [facility], [address] [place], [place] [address]  CBC     Status: Abnormal   Collection Time: 09/11/20  8:44 AM  Result Value Ref Range   WBC 14.3 (H) 4.0 - 10.5 K/uL   RBC 4.00 3.87 - 5.11 MIL/uL   Hemoglobin 12.6 12.0 - 15.0 g/dL   HCT 38.1 36.0 - 46.0 %   MCV 95.3 80.0 - 100.0 fL   MCH 31.5 26.0 - 34.0 pg   MCHC 33.1 30.0 - 36.0 g/dL   RDW 13.2 11.5 - 15.5 %   Platelets  279 150 - 400 K/uL   nRBC 0.0 0.0 - 0.2 %    Comment: Performed at [facility], [address] [place], [place] [address]    Radiology: No results found.  No results found.  No results found.    Assessment and Plan: Patient Active Problem List   Diagnosis Date Noted   Status post reverse arthroplasty of shoulder, left 09/10/2020   Obstructive chronic bronchitis with  exacerbation ([place]) 01/30/2019   Oxygen dependent 01/30/2019   S/P Nissen fundoplication (with gastrostomy tube placement) ([place]) 10/19/2017   CAD (coronary artery disease), native coronary artery 10/18/2017   Hiatal hernia 10/15/2017   Abnormal EKG 10/15/2017   Dysphagia    Schatzki's ring    Gastric erosion    Advanced care planning/counseling discussion 04/21/2017   Acute on chronic respiratory failure with hypoxia ([place]) 05/08/2016   COPD exacerbation ([place]) 05/08/2016   Hyperglycemia 05/08/2016   Dyspnea 05/06/2016   Encounter for hepatitis C screening test for low risk patient 03/19/2016   Essential hypertension 03/19/2015   COPD (chronic obstructive pulmonary disease) ([place]) 03/19/2015   Hyperlipidemia 03/19/2015   Osteoarthritis of both hands 03/19/2015   Osteoporosis 03/19/2015   Acid reflux 03/19/2015   Major depression, chronic 09/12/2014    1. SOB (shortness of breath) Baseline shortness of breath with her underlying history of COPD we will continue to monitor her progression spirometry was done today shows slight decline but this could be within the frame of variation that can be seen on day-to-day variation with spirometry - Spirometry with Graph  2. Oxygen dependent She needs to continue with her oxygen therapy as prescribed  3. Chronic obstructive pulmonary disease, unspecified COPD type ([place]) She has very severe end-stage COPD she is aware I gave her prescription for azithromycin as well as prednisone - predniSONE (STERAPRED UNI-PAK 21 TAB) 10 MG (21) TBPK tablet; Dose pack for 12  days as directed  Dispense: 21 tablet; Refill: 0 - DG Chest 2 View; Future  4. Chronic respiratory failure with hypoxia ([place]) Continue with oxygen therapy as already discussed above  General Counseling: I have discussed the findings of the evaluation and examination with Joyce Rodriguez.  I have also discussed any further diagnostic evaluation thatmay be needed or ordered today. Joyce Rodriguez of the findings of todays visit. We also reviewed her medications today and discussed drug interactions and side effects including but not limited excessive drowsiness and altered mental states. We also discussed that there is always a risk not just to her but also people around her. she has been encouraged to call the office with any questions or concerns that should arise related to todays visit.  Orders Placed This Encounter  Procedures   Spirometry with Graph    Order Specific Question:   Where should this test be performed?    Answer:   [facility]    Order Specific Question:   Basic spirometry    Answer:   Yes     Time spent: 72  I have personally obtained a history, examined the patient, evaluated laboratory and imaging results, formulated the assessment and plan and placed orders.    Allyne Gee, MD [facility] Pulmonary and Critical Care Sleep medicine

## 2020-11-06 ENCOUNTER — Other Ambulatory Visit: Payer: Self-pay

## 2020-11-06 DIAGNOSIS — J449 Chronic obstructive pulmonary disease, unspecified: Secondary | ICD-10-CM

## 2020-11-06 MED ORDER — PREDNISONE 10 MG (48) PO TBPK: ORAL_TABLET | ORAL | 0 refills | Status: DC

## 2020-11-06 NOTE — Telephone Encounter (Signed)
Phar called that we put direction for prednisone for 12 days as per dsk change to 12 days taper and clarify with phar and fix

## 2020-11-12 DIAGNOSIS — Z9049 Acquired absence of other specified parts of digestive tract: Secondary | ICD-10-CM | POA: Diagnosis not present

## 2020-11-12 DIAGNOSIS — J439 Emphysema, unspecified: Secondary | ICD-10-CM | POA: Diagnosis not present

## 2020-11-12 DIAGNOSIS — Z7951 Long term (current) use of inhaled steroids: Secondary | ICD-10-CM | POA: Diagnosis not present

## 2020-11-12 DIAGNOSIS — Z8701 Personal history of pneumonia (recurrent): Secondary | ICD-10-CM | POA: Diagnosis not present

## 2020-11-12 DIAGNOSIS — K219 Gastro-esophageal reflux disease without esophagitis: Secondary | ICD-10-CM | POA: Diagnosis not present

## 2020-11-12 DIAGNOSIS — M19041 Primary osteoarthritis, right hand: Secondary | ICD-10-CM | POA: Diagnosis not present

## 2020-11-12 DIAGNOSIS — Z471 Aftercare following joint replacement surgery: Secondary | ICD-10-CM | POA: Diagnosis not present

## 2020-11-12 DIAGNOSIS — I10 Essential (primary) hypertension: Secondary | ICD-10-CM | POA: Diagnosis not present

## 2020-11-12 DIAGNOSIS — Z7982 Long term (current) use of aspirin: Secondary | ICD-10-CM | POA: Diagnosis not present

## 2020-11-12 DIAGNOSIS — F329 Major depressive disorder, single episode, unspecified: Secondary | ICD-10-CM | POA: Diagnosis not present

## 2020-11-12 DIAGNOSIS — Q393 Congenital stenosis and stricture of esophagus: Secondary | ICD-10-CM | POA: Diagnosis not present

## 2020-11-12 DIAGNOSIS — Z87891 Personal history of nicotine dependence: Secondary | ICD-10-CM | POA: Diagnosis not present

## 2020-11-12 DIAGNOSIS — I2510 Atherosclerotic heart disease of native coronary artery without angina pectoris: Secondary | ICD-10-CM | POA: Diagnosis not present

## 2020-11-12 DIAGNOSIS — Z9981 Dependence on supplemental oxygen: Secondary | ICD-10-CM | POA: Diagnosis not present

## 2020-11-12 DIAGNOSIS — Z96612 Presence of left artificial shoulder joint: Secondary | ICD-10-CM | POA: Diagnosis not present

## 2020-11-12 DIAGNOSIS — R739 Hyperglycemia, unspecified: Secondary | ICD-10-CM | POA: Diagnosis not present

## 2020-11-12 DIAGNOSIS — Z90710 Acquired absence of both cervix and uterus: Secondary | ICD-10-CM | POA: Diagnosis not present

## 2020-11-12 DIAGNOSIS — Z9181 History of falling: Secondary | ICD-10-CM | POA: Diagnosis not present

## 2020-11-12 DIAGNOSIS — M19042 Primary osteoarthritis, left hand: Secondary | ICD-10-CM | POA: Diagnosis not present

## 2020-11-12 DIAGNOSIS — E785 Hyperlipidemia, unspecified: Secondary | ICD-10-CM | POA: Diagnosis not present

## 2020-11-12 DIAGNOSIS — J9621 Acute and chronic respiratory failure with hypoxia: Secondary | ICD-10-CM | POA: Diagnosis not present

## 2020-11-12 DIAGNOSIS — M810 Age-related osteoporosis without current pathological fracture: Secondary | ICD-10-CM | POA: Diagnosis not present

## 2020-11-12 DIAGNOSIS — K429 Umbilical hernia without obstruction or gangrene: Secondary | ICD-10-CM | POA: Diagnosis not present

## 2020-11-12 DIAGNOSIS — R1310 Dysphagia, unspecified: Secondary | ICD-10-CM | POA: Diagnosis not present

## 2020-11-15 DIAGNOSIS — T8141XA Infection following a procedure, superficial incisional surgical site, initial encounter: Secondary | ICD-10-CM | POA: Diagnosis not present

## 2020-11-15 DIAGNOSIS — Z96612 Presence of left artificial shoulder joint: Secondary | ICD-10-CM | POA: Diagnosis not present

## 2020-11-26 DIAGNOSIS — I2510 Atherosclerotic heart disease of native coronary artery without angina pectoris: Secondary | ICD-10-CM | POA: Diagnosis not present

## 2020-11-26 DIAGNOSIS — I10 Essential (primary) hypertension: Secondary | ICD-10-CM | POA: Diagnosis not present

## 2020-11-26 DIAGNOSIS — Z90710 Acquired absence of both cervix and uterus: Secondary | ICD-10-CM | POA: Diagnosis not present

## 2020-11-26 DIAGNOSIS — Z8701 Personal history of pneumonia (recurrent): Secondary | ICD-10-CM | POA: Diagnosis not present

## 2020-11-26 DIAGNOSIS — J439 Emphysema, unspecified: Secondary | ICD-10-CM | POA: Diagnosis not present

## 2020-11-26 DIAGNOSIS — Z9049 Acquired absence of other specified parts of digestive tract: Secondary | ICD-10-CM | POA: Diagnosis not present

## 2020-11-26 DIAGNOSIS — K429 Umbilical hernia without obstruction or gangrene: Secondary | ICD-10-CM | POA: Diagnosis not present

## 2020-11-26 DIAGNOSIS — Z471 Aftercare following joint replacement surgery: Secondary | ICD-10-CM | POA: Diagnosis not present

## 2020-11-26 DIAGNOSIS — F329 Major depressive disorder, single episode, unspecified: Secondary | ICD-10-CM | POA: Diagnosis not present

## 2020-11-26 DIAGNOSIS — Z9181 History of falling: Secondary | ICD-10-CM | POA: Diagnosis not present

## 2020-11-26 DIAGNOSIS — M19041 Primary osteoarthritis, right hand: Secondary | ICD-10-CM | POA: Diagnosis not present

## 2020-11-26 DIAGNOSIS — Z9981 Dependence on supplemental oxygen: Secondary | ICD-10-CM | POA: Diagnosis not present

## 2020-11-26 DIAGNOSIS — E785 Hyperlipidemia, unspecified: Secondary | ICD-10-CM | POA: Diagnosis not present

## 2020-11-26 DIAGNOSIS — Z87891 Personal history of nicotine dependence: Secondary | ICD-10-CM | POA: Diagnosis not present

## 2020-11-26 DIAGNOSIS — Z96612 Presence of left artificial shoulder joint: Secondary | ICD-10-CM | POA: Diagnosis not present

## 2020-11-26 DIAGNOSIS — K219 Gastro-esophageal reflux disease without esophagitis: Secondary | ICD-10-CM | POA: Diagnosis not present

## 2020-11-26 DIAGNOSIS — J9621 Acute and chronic respiratory failure with hypoxia: Secondary | ICD-10-CM | POA: Diagnosis not present

## 2020-11-26 DIAGNOSIS — Z7982 Long term (current) use of aspirin: Secondary | ICD-10-CM | POA: Diagnosis not present

## 2020-11-26 DIAGNOSIS — Z7951 Long term (current) use of inhaled steroids: Secondary | ICD-10-CM | POA: Diagnosis not present

## 2020-11-26 DIAGNOSIS — M19042 Primary osteoarthritis, left hand: Secondary | ICD-10-CM | POA: Diagnosis not present

## 2020-11-26 DIAGNOSIS — R1310 Dysphagia, unspecified: Secondary | ICD-10-CM | POA: Diagnosis not present

## 2020-11-26 DIAGNOSIS — Q393 Congenital stenosis and stricture of esophagus: Secondary | ICD-10-CM | POA: Diagnosis not present

## 2020-11-26 DIAGNOSIS — R739 Hyperglycemia, unspecified: Secondary | ICD-10-CM | POA: Diagnosis not present

## 2020-11-26 DIAGNOSIS — M810 Age-related osteoporosis without current pathological fracture: Secondary | ICD-10-CM | POA: Diagnosis not present

## 2020-11-29 DIAGNOSIS — E782 Mixed hyperlipidemia: Secondary | ICD-10-CM | POA: Diagnosis not present

## 2020-11-29 DIAGNOSIS — I10 Essential (primary) hypertension: Secondary | ICD-10-CM | POA: Diagnosis not present

## 2020-11-29 DIAGNOSIS — J431 Panlobular emphysema: Secondary | ICD-10-CM | POA: Diagnosis not present

## 2020-12-02 ENCOUNTER — Ambulatory Visit: Payer: PPO | Admitting: Internal Medicine

## 2020-12-02 ENCOUNTER — Telehealth: Payer: Self-pay

## 2020-12-02 ENCOUNTER — Other Ambulatory Visit: Payer: Self-pay

## 2020-12-02 ENCOUNTER — Encounter: Payer: Self-pay | Admitting: Internal Medicine

## 2020-12-02 VITALS — BP 154/90 | HR 73 | Temp 98.3°F | Ht 62.0 in | Wt 130.4 lb

## 2020-12-02 DIAGNOSIS — J9611 Chronic respiratory failure with hypoxia: Secondary | ICD-10-CM

## 2020-12-02 DIAGNOSIS — J449 Chronic obstructive pulmonary disease, unspecified: Secondary | ICD-10-CM | POA: Diagnosis not present

## 2020-12-02 DIAGNOSIS — J479 Bronchiectasis, uncomplicated: Secondary | ICD-10-CM | POA: Diagnosis not present

## 2020-12-02 DIAGNOSIS — Z9981 Dependence on supplemental oxygen: Secondary | ICD-10-CM | POA: Diagnosis not present

## 2020-12-02 DIAGNOSIS — J441 Chronic obstructive pulmonary disease with (acute) exacerbation: Secondary | ICD-10-CM

## 2020-12-02 MED ORDER — SULFAMETHOXAZOLE-TRIMETHOPRIM 800-160 MG PO TABS
1.0000 | ORAL_TABLET | Freq: Two times a day (BID) | ORAL | 1 refills | Status: DC
Start: 2020-12-02 — End: 2022-01-15

## 2020-12-02 NOTE — Telephone Encounter (Signed)
PATIENT ADVISED THAT SHE'S SCHEDULED FOR CT CHEST ON 12/11/20 @ 1:30 [place]/TAT

## 2020-12-02 NOTE — Patient Instructions (Signed)

## 2020-12-02 NOTE — Progress Notes (Signed)
[facility] [place], [place] [address]  Pulmonary Sleep Medicine   Office Visit Note  Patient Name: Joyce Rodriguez DOB: Sep 24, 1945 MRN [id number]  Date of Service: 12/02/2020  Complaints/HPI: COPD Oxygen dependent patient has very severe COPD she is under good control Reitnauer has been using her inhalers as prescribed.  Still has shortness of breath.  She has not had any admissions to the hospital for COPD exacerbation recent denies having any chest pain palpitations.  She occasionally does have cough and some wheezing noted.  ROS  General: (-) fever, (-) chills, (-) night sweats, (-) weakness Skin: (-) rashes, (-) itching,. Eyes: (-) visual changes, (-) redness, (-) itching. Nose and Sinuses: (-) nasal stuffiness or itchiness, (-) postnasal drip, (-) nosebleeds, (-) sinus trouble. Mouth and Throat: (-) sore throat, (-) hoarseness. Neck: (-) swollen glands, (-) enlarged thyroid, (-) neck pain. Respiratory: + cough, (-) bloody sputum, + shortness of breath, + wheezing. Cardiovascular: - ankle swelling, (-) chest pain. Lymphatic: (-) lymph node enlargement. Neurologic: (-) numbness, (-) tingling. Psychiatric: (-) anxiety, (-) depression   Current Medication: Outpatient Encounter Medications as of 12/02/2020  Medication Sig   acetaminophen (TYLENOL) 500 MG tablet Take 1,000 mg by mouth every 6 (six) hours as needed for moderate pain.   albuterol (VENTOLIN HFA) 108 (90 Base) MCG/ACT inhaler Inhale 2 puffs into the lungs every 6 (six) hours as needed. (Patient taking differently: Inhale 2 puffs into the lungs every 6 (six) hours as needed for wheezing or shortness of breath.)   aspirin EC 325 MG tablet Take 1 tablet (325 mg total) by mouth daily.   cholecalciferol (VITAMIN D) 1000 units tablet Take 1,000 Units by mouth daily.   Docusate Calcium (STOOL SOFTENER PO) Take 2 tablets by mouth daily.   escitalopram (LEXAPRO) 10 MG tablet Take 1 tablet (10 mg total)  by mouth daily. (Patient taking differently: Take 10 mg by mouth every morning.)   ezetimibe (ZETIA) 10 MG tablet Take 10 mg by mouth every morning.   fluticasone-salmeterol (ADVAIR) 250-50 MCG/ACT AEPB Inhale 1 puff into the lungs in the morning and at bedtime.   Multiple Vitamin (MULTIVITAMIN WITH MINERALS) TABS tablet Take 1 tablet by mouth daily.   omeprazole (PRILOSEC) 20 MG capsule Take 20 mg by mouth daily as needed (heartburn).   OXYGEN Inhale 2.5-3 L into the lungs at bedtime. 3 L portable   potassium chloride (K-DUR) 10 MEQ tablet Take 1 tablet (10 mEq total) by mouth daily.   predniSONE (STERAPRED UNI-PAK 48 TAB) 10 MG (48) TBPK tablet Use as directed for 12 days   propranolol ER (INDERAL LA) 60 MG 24 hr capsule Take 60 mg by mouth daily.   SPIRIVA HANDIHALER 18 MCG inhalation capsule PLACE 1 CAPSULE INTO INHALER AND INHALE DAILY (Patient taking differently: Place 18 mcg into inhaler and inhale every morning.)   sulfamethoxazole-trimethoprim (BACTRIM DS) 800-160 MG tablet Take 1 tablet by mouth 2 (two) times daily.   traMADol (ULTRAM) 50 MG tablet Take 1-2 tablets (50-100 mg total) by mouth every 6 (six) hours as needed for moderate pain.   zinc gluconate 50 MG tablet Take 50 mg by mouth daily.   [EXPIRED] azithromycin (ZITHROMAX) 250 MG tablet Take 2 tablets on day 1, then 1 tablet daily on days 2 through 5   [DISCONTINUED] ondansetron (ZOFRAN) 4 MG tablet Take 1 tablet (4 mg total) by mouth every 6 (six) hours as needed for nausea. (Patient not taking: Reported on 12/02/2020)   [  DISCONTINUED] predniSONE (STERAPRED UNI-PAK 21 TAB) 10 MG (21) TBPK tablet Dose pack for 12 days as directed   No facility-administered encounter medications on file as of 12/02/2020.    Surgical History: Past Surgical History:  Procedure Laterality Date   ABDOMINAL HYSTERECTOMY     CHOLECYSTECTOMY     ESOPHAGEAL MANOMETRY N/A 09/13/2017   Procedure: ESOPHAGEAL MANOMETRY (EM);  Surgeon: Mauri Pole,  MD;  Location: [facility] ENDOSCOPY;  Service: Endoscopy;  Laterality: N/A;   ESOPHAGOGASTRODUODENOSCOPY (EGD) WITH PROPOFOL N/A 05/11/2017   Procedure: ESOPHAGOGASTRODUODENOSCOPY (EGD) WITH PROPOFOL;  Surgeon: Lin Landsman, MD;  Location: [facility] ENDOSCOPY;  Service: Gastroenterology;  Laterality: N/A;   EYE SURGERY Bilateral    CATARACTS   FOOT SURGERY     MORTONS NEUROMA   LAPAROSCOPIC NISSEN FUNDOPLICATION N/A [id number]   Procedure: LAPAROSCOPIC NISSEN FUNDOPLICATION;  Surgeon: Jules Husbands, MD;  Location: [facility] ORS;  Service: General;  Laterality: N/A;   NISSEN FUNDOPLICATION N/A [id number]   Procedure: NISSEN FUNDOPLICATION;  Surgeon: Jules Husbands, MD;  Location: [facility] ORS;  Service: General;  Laterality: N/A;   REVERSE SHOULDER ARTHROPLASTY Left 09/10/2020   Procedure: REVERSE SHOULDER ARTHROPLASTY;  Surgeon: Corky Mull, MD;  Location: [facility] ORS;  Service: Orthopedics;  Laterality: Left;    Medical History: Past Medical History:  Diagnosis Date   Aortic atherosclerosis ([place])    Arthritis    Complication of anesthesia    difficulty waking up   COPD (chronic obstructive pulmonary disease) (HCC)    Coronary artery disease    Dyspnea    Emphysema of lung (HCC)    SEVERE   GERD (gastroesophageal reflux disease)    Headache    H/O   History of hiatal hernia    Hyperlipidemia    Hypertension    H/O OFF MEDS SINCE 2020 BY PCP   Osteoporosis    Oxygen deficiency    uses O2 at night   Oxygen dependent    Pneumonia    Umbilical hernia     Family History: Family History  Problem Relation Age of Onset   Ovarian cancer Mother    Emphysema Father    Melanoma Sister    Lung cancer Brother     Social History: Social History   Socioeconomic History   Marital status: Married    Spouse name: Not on file   Number of children: Not on file   Years of education: 12   Highest education level: 12th grade  Occupational History   Occupation: retired  Tobacco Use   Smoking status:  Former    Packs/day: 1.50    Years: 30.00    Pack years: 45.00    Types: Cigarettes    Quit date: 03/18/2001    Years since quitting: 19.7   Smokeless tobacco: Never  Vaping Use   Vaping Use: Never used  Substance and Sexual Activity   Alcohol use: Yes    Comment: rare   Drug use: No   Sexual activity: Not Currently  Other Topics Concern   Not on file  Social History Narrative   Not on file   Social Determinants of Health   Financial Resource Strain: Not on file  Food Insecurity: Not on file  Transportation Needs: Not on file  Physical Activity: Not on file  Stress: Not on file  Social Connections: Not on file  Intimate Partner Violence: Not on file    Vital Signs: Blood pressure (!) 154/90, pulse 73, temperature 98.3 F (36.8 C), height 5'  2" (1.575 m), weight 130 lb 6.4 oz (59.1 kg), SpO2 100 %.  Examination: General Appearance: The patient is well-developed, well-nourished, and in no distress. Skin: Gross inspection of skin unremarkable. Head: normocephalic, no gross deformities. Eyes: no gross deformities noted. ENT: ears appear grossly normal no exudates. Neck: Supple. No thyromegaly. No LAD. Respiratory: few rhonchi noted. Cardiovascular: Normal S1 and S2 without murmur or rub. Extremities: No cyanosis. pulses are equal. Neurologic: Alert and oriented. No involuntary movements.  LABS: Recent Results (from the past 2160 hour(s))  SARS CORONAVIRUS 2 (TAT 6-24 HRS) Nasopharyngeal Nasopharyngeal Swab     Status: None   Collection Time: 09/05/20 10:15 AM   Specimen: Nasopharyngeal Swab  Result Value Ref Range   SARS Coronavirus 2 NEGATIVE NEGATIVE    Comment: (NOTE) SARS-CoV-2 target nucleic acids are NOT DETECTED.  The SARS-CoV-2 RNA is generally detectable in upper and lower respiratory specimens during the acute phase of infection. Negative results do not preclude SARS-CoV-2 infection, do not rule out co-infections with other pathogens, and should not be  used as the sole basis for treatment or other patient management decisions. Negative results must be combined with clinical observations, patient history, and epidemiological information. The expected result is Negative.  Fact Sheet for Patients: SugarRoll.be  Fact Sheet for Healthcare Providers: https://www.woods-mathews.com/  This test is not yet approved or cleared by the [place] FDA and  has been authorized for detection and/or diagnosis of SARS-CoV-2 by FDA under an Emergency Use Authorization (EUA). This EUA will remain  in effect (meaning this test can be used) for the duration of the COVID-19 declaration under Se ction 564(b)(1) of the Act, 21 U.S.C. section 360bbb-3(b)(1), unless the authorization is terminated or revoked sooner.  Performed at [facility], [place] [address], [place], [place] [address]   ABO/Rh     Status: None   Collection Time: 09/10/20  6:31 AM  Result Value Ref Range   ABO/RH(D)      O POS Performed at [facility], [address] [place], [place] [address]   Surgical pathology     Status: None   Collection Time: 09/10/20  8:39 AM  Result Value Ref Range   SURGICAL PATHOLOGY      SURGICAL PATHOLOGY CASE: [phone] PATIENT: [facility] Report     Specimen Submitted: A. 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 HUMERAL HEAD, LEFT; ARTHROPLASTY: - DEGENERATIVE OSTEOARTHROPATHY. - NEGATIVE FOR MALIGNANCY.   GROSS DESCRIPTION: A. Labeled: Left humeral head Received: Formalin Collection time: 8:39 AM on 09/10/2020 Placed into formalin time: 9:23 AM on 09/10/2020 Size of specimen:      Head -5.4 x 5.1 x 1.6 cm      Neck -None identified      Additional tissue: None identified Articular surface: The articular surface is tan-white and lobulated  with a central 4 x 3.5 cm area of eburnation. Cut surface: The cut surface is tan-yellow and firm. Other findings: None grossly appreciated.  Block summary: 1 - representative sections of eburnation to adjacent cartilage  Tissue decalcification: Yes, 1 cassette  RB  09/10/2020  Final Diagnosis performed by Betsy Pries, MD.   Electronically signed 09/12/2020 10:55:03AM The electronic signature indicates that the named Attending Pathologist has evaluated the specimen Technical component performed at [facility], [address] [place], [place] [address] Lab: [phone] Dir: Rush Farmer, MD, MMM  Professional component performed  at [facility], [facility], [place], [place], [place] [address] Lab: [phone] Dir: Dellia Nims. Rubinas, MD   Basic metabolic panel     Status: Abnormal   Collection Time: 09/11/20  8:44 AM  Result Value Ref Range   Sodium 136 135 - 145 mmol/L   Potassium 3.7 3.5 - 5.1 mmol/L   Chloride 101 98 - 111 mmol/L   CO2 26 22 - 32 mmol/L   Glucose, Bld 125 (H) 70 - 99 mg/dL    Comment: Glucose reference range applies only to samples taken after fasting for at least 8 hours.   BUN 7 (L) 8 - 23 mg/dL   Creatinine, Ser 0.55 0.44 - 1.00 mg/dL   Calcium 8.9 8.9 - 10.3 mg/dL   GFR, Estimated >60 >60 mL/min    Comment: (NOTE) Calculated using the CKD-EPI Creatinine Equation (2021)    Anion gap 9 5 - 15    Comment: Performed at [facility], [address] [place], [place] [address]  CBC     Status: Abnormal   Collection Time: 09/11/20  8:44 AM  Result Value Ref Range   WBC 14.3 (H) 4.0 - 10.5 K/uL   RBC 4.00 3.87 - 5.11 MIL/uL   Hemoglobin 12.6 12.0 - 15.0 g/dL   HCT 38.1 36.0 - 46.0 %   MCV 95.3 80.0 - 100.0 fL   MCH 31.5 26.0 - 34.0 pg   MCHC 33.1 30.0 - 36.0 g/dL   RDW 13.2 11.5 - 15.5 %   Platelets 279 150 - 400 K/uL   nRBC 0.0 0.0 - 0.2 %    Comment: Performed at [facility], [address], [place],  [place] [address]    Radiology: No results found.  No results found.  No results found.    Assessment and Plan: Patient Active Problem List   Diagnosis Date Noted   Status post reverse arthroplasty of shoulder, left 09/10/2020   Primary osteoarthritis of left shoulder 12/04/2019   Primary osteoarthritis of right shoulder 12/04/2019   Rotator cuff tendinitis, left 12/04/2019   Statin myopathy [id number]   Umbilical hernia with obstruction, without gangrene 05/31/2019   Panlobular emphysema ([place]) 05/08/2019   Recurrent major depressive disorder, in partial remission ([place]) 05/08/2019   Obstructive chronic bronchitis with exacerbation ([place]) 01/30/2019   Oxygen dependent 01/30/2019   S/P Nissen fundoplication (with gastrostomy tube placement) ([place]) 10/19/2017   CAD (coronary artery disease), native coronary artery 10/18/2017   Hiatal hernia 10/15/2017   Abnormal EKG 10/15/2017   Dysphagia    Schatzki's ring    Gastric erosion    Advanced care planning/counseling discussion 04/21/2017   Acute on chronic respiratory failure with hypoxia ([place]) 05/08/2016   COPD exacerbation ([place]) 05/08/2016   Hyperglycemia 05/08/2016   Dyspnea 05/06/2016   Encounter for hepatitis C screening test for low risk patient 03/19/2016   Essential hypertension 03/19/2015   COPD (chronic obstructive pulmonary disease) ([place]) 03/19/2015   Hyperlipidemia 03/19/2015   Osteoarthritis of both hands 03/19/2015   Osteoporosis 03/19/2015   Acid reflux 03/19/2015   Major depression, chronic 09/12/2014    1. Chronic obstructive pulmonary disease, unspecified COPD type ([place]) Severe disease has a little bit of a cough and some phlegm production and also she does have some evidence of bronchiectasis.  She may benefit from chronic suppression therapy for exacerbations of her bronchiectasis - sulfamethoxazole-trimethoprim (BACTRIM DS) 800-160 MG tablet; Take 1 tablet by mouth 2 (two) times daily.  Dispense: 60 tablet; Refill:  1  2. COPD, frequent exacerbations ([place]) As discussed above  3. Chronic respiratory failure with hypoxia (HCC) On oxygen therapy which will be continued.  4. Oxygen dependent She has been using her oxygen and has had excellent compliance with her oxygen therapy.  5. Bronchiectasis without complication ([place]) Follow-up CT scan is suggested.  She may benefit from chest vest therapy - CT Chest High Resolution; Future   General Counseling: I have discussed the findings of the evaluation and examination with Dorian Pod.  I have also discussed any further diagnostic evaluation thatmay be needed or ordered today. Jynia verbalizes understanding of the findings of todays visit. We also reviewed her medications today and discussed drug interactions and side effects including but not limited excessive drowsiness and altered mental states. We also discussed that there is always a risk not just to her but also people around her. she has been encouraged to call the office with any questions or concerns that should arise related to todays visit.  Orders Placed This Encounter  Procedures   CT Chest High Resolution    Standing Status:   Future    Standing Expiration Date:   12/02/2021    Order Specific Question:   Preferred imaging location?    Answer:   [facility]     Time spent: 14  I have personally obtained a history, examined the patient, evaluated laboratory and imaging results, formulated the assessment and plan and placed orders.    Allyne Gee, MD [facility] Sleep medicine

## 2020-12-09 ENCOUNTER — Other Ambulatory Visit: Payer: Self-pay

## 2020-12-09 DIAGNOSIS — J449 Chronic obstructive pulmonary disease, unspecified: Secondary | ICD-10-CM

## 2020-12-09 MED ORDER — SPIRIVA HANDIHALER 18 MCG IN CAPS: 18.0000 ug | ORAL_CAPSULE | RESPIRATORY_TRACT | 12 refills | Status: DC

## 2020-12-11 ENCOUNTER — Ambulatory Visit
Admission: RE | Admit: 2020-12-11 | Discharge: 2020-12-11 | Disposition: A | Discharge status: Home / Self Care | Payer: PPO | Source: Ambulatory Visit | Attending: Internal Medicine | Admitting: Internal Medicine

## 2020-12-11 ENCOUNTER — Other Ambulatory Visit: Payer: Self-pay

## 2020-12-11 DIAGNOSIS — J479 Bronchiectasis, uncomplicated: Secondary | ICD-10-CM | POA: Diagnosis not present

## 2020-12-11 DIAGNOSIS — J439 Emphysema, unspecified: Secondary | ICD-10-CM | POA: Diagnosis not present

## 2020-12-11 DIAGNOSIS — I700 Atherosclerosis of aorta: Secondary | ICD-10-CM | POA: Diagnosis not present

## 2020-12-18 DIAGNOSIS — R1310 Dysphagia, unspecified: Secondary | ICD-10-CM | POA: Diagnosis not present

## 2020-12-18 DIAGNOSIS — Z8701 Personal history of pneumonia (recurrent): Secondary | ICD-10-CM | POA: Diagnosis not present

## 2020-12-18 DIAGNOSIS — I10 Essential (primary) hypertension: Secondary | ICD-10-CM | POA: Diagnosis not present

## 2020-12-18 DIAGNOSIS — Z9181 History of falling: Secondary | ICD-10-CM | POA: Diagnosis not present

## 2020-12-18 DIAGNOSIS — F329 Major depressive disorder, single episode, unspecified: Secondary | ICD-10-CM | POA: Diagnosis not present

## 2020-12-18 DIAGNOSIS — I2510 Atherosclerotic heart disease of native coronary artery without angina pectoris: Secondary | ICD-10-CM | POA: Diagnosis not present

## 2020-12-18 DIAGNOSIS — E785 Hyperlipidemia, unspecified: Secondary | ICD-10-CM | POA: Diagnosis not present

## 2020-12-18 DIAGNOSIS — M810 Age-related osteoporosis without current pathological fracture: Secondary | ICD-10-CM | POA: Diagnosis not present

## 2020-12-18 DIAGNOSIS — M19041 Primary osteoarthritis, right hand: Secondary | ICD-10-CM | POA: Diagnosis not present

## 2020-12-18 DIAGNOSIS — K219 Gastro-esophageal reflux disease without esophagitis: Secondary | ICD-10-CM | POA: Diagnosis not present

## 2020-12-18 DIAGNOSIS — J439 Emphysema, unspecified: Secondary | ICD-10-CM | POA: Diagnosis not present

## 2020-12-18 DIAGNOSIS — R739 Hyperglycemia, unspecified: Secondary | ICD-10-CM | POA: Diagnosis not present

## 2020-12-18 DIAGNOSIS — Z9981 Dependence on supplemental oxygen: Secondary | ICD-10-CM | POA: Diagnosis not present

## 2020-12-18 DIAGNOSIS — K429 Umbilical hernia without obstruction or gangrene: Secondary | ICD-10-CM | POA: Diagnosis not present

## 2020-12-18 DIAGNOSIS — Z7982 Long term (current) use of aspirin: Secondary | ICD-10-CM | POA: Diagnosis not present

## 2020-12-18 DIAGNOSIS — Z471 Aftercare following joint replacement surgery: Secondary | ICD-10-CM | POA: Diagnosis not present

## 2020-12-18 DIAGNOSIS — Z7951 Long term (current) use of inhaled steroids: Secondary | ICD-10-CM | POA: Diagnosis not present

## 2020-12-18 DIAGNOSIS — J9621 Acute and chronic respiratory failure with hypoxia: Secondary | ICD-10-CM | POA: Diagnosis not present

## 2020-12-18 DIAGNOSIS — Z90710 Acquired absence of both cervix and uterus: Secondary | ICD-10-CM | POA: Diagnosis not present

## 2020-12-18 DIAGNOSIS — M19042 Primary osteoarthritis, left hand: Secondary | ICD-10-CM | POA: Diagnosis not present

## 2020-12-18 DIAGNOSIS — Z9049 Acquired absence of other specified parts of digestive tract: Secondary | ICD-10-CM | POA: Diagnosis not present

## 2020-12-18 DIAGNOSIS — Z87891 Personal history of nicotine dependence: Secondary | ICD-10-CM | POA: Diagnosis not present

## 2020-12-18 DIAGNOSIS — Q393 Congenital stenosis and stricture of esophagus: Secondary | ICD-10-CM | POA: Diagnosis not present

## 2020-12-18 DIAGNOSIS — Z96612 Presence of left artificial shoulder joint: Secondary | ICD-10-CM | POA: Diagnosis not present

## 2020-12-31 DIAGNOSIS — K219 Gastro-esophageal reflux disease without esophagitis: Secondary | ICD-10-CM | POA: Diagnosis not present

## 2020-12-31 DIAGNOSIS — J439 Emphysema, unspecified: Secondary | ICD-10-CM | POA: Diagnosis not present

## 2020-12-31 DIAGNOSIS — R739 Hyperglycemia, unspecified: Secondary | ICD-10-CM | POA: Diagnosis not present

## 2020-12-31 DIAGNOSIS — M19041 Primary osteoarthritis, right hand: Secondary | ICD-10-CM | POA: Diagnosis not present

## 2020-12-31 DIAGNOSIS — Z90710 Acquired absence of both cervix and uterus: Secondary | ICD-10-CM | POA: Diagnosis not present

## 2020-12-31 DIAGNOSIS — I2510 Atherosclerotic heart disease of native coronary artery without angina pectoris: Secondary | ICD-10-CM | POA: Diagnosis not present

## 2020-12-31 DIAGNOSIS — E785 Hyperlipidemia, unspecified: Secondary | ICD-10-CM | POA: Diagnosis not present

## 2020-12-31 DIAGNOSIS — Z471 Aftercare following joint replacement surgery: Secondary | ICD-10-CM | POA: Diagnosis not present

## 2020-12-31 DIAGNOSIS — J9621 Acute and chronic respiratory failure with hypoxia: Secondary | ICD-10-CM | POA: Diagnosis not present

## 2020-12-31 DIAGNOSIS — F329 Major depressive disorder, single episode, unspecified: Secondary | ICD-10-CM | POA: Diagnosis not present

## 2020-12-31 DIAGNOSIS — Q393 Congenital stenosis and stricture of esophagus: Secondary | ICD-10-CM | POA: Diagnosis not present

## 2020-12-31 DIAGNOSIS — Z9981 Dependence on supplemental oxygen: Secondary | ICD-10-CM | POA: Diagnosis not present

## 2020-12-31 DIAGNOSIS — R1310 Dysphagia, unspecified: Secondary | ICD-10-CM | POA: Diagnosis not present

## 2020-12-31 DIAGNOSIS — Z7951 Long term (current) use of inhaled steroids: Secondary | ICD-10-CM | POA: Diagnosis not present

## 2020-12-31 DIAGNOSIS — M810 Age-related osteoporosis without current pathological fracture: Secondary | ICD-10-CM | POA: Diagnosis not present

## 2020-12-31 DIAGNOSIS — Z9049 Acquired absence of other specified parts of digestive tract: Secondary | ICD-10-CM | POA: Diagnosis not present

## 2020-12-31 DIAGNOSIS — K429 Umbilical hernia without obstruction or gangrene: Secondary | ICD-10-CM | POA: Diagnosis not present

## 2020-12-31 DIAGNOSIS — Z7982 Long term (current) use of aspirin: Secondary | ICD-10-CM | POA: Diagnosis not present

## 2020-12-31 DIAGNOSIS — Z9181 History of falling: Secondary | ICD-10-CM | POA: Diagnosis not present

## 2020-12-31 DIAGNOSIS — I10 Essential (primary) hypertension: Secondary | ICD-10-CM | POA: Diagnosis not present

## 2020-12-31 DIAGNOSIS — Z87891 Personal history of nicotine dependence: Secondary | ICD-10-CM | POA: Diagnosis not present

## 2020-12-31 DIAGNOSIS — M19042 Primary osteoarthritis, left hand: Secondary | ICD-10-CM | POA: Diagnosis not present

## 2020-12-31 DIAGNOSIS — Z96612 Presence of left artificial shoulder joint: Secondary | ICD-10-CM | POA: Diagnosis not present

## 2020-12-31 DIAGNOSIS — Z8701 Personal history of pneumonia (recurrent): Secondary | ICD-10-CM | POA: Diagnosis not present

## 2021-01-01 ENCOUNTER — Telehealth: Payer: Self-pay

## 2021-01-01 NOTE — Telephone Encounter (Signed)
[facility] occupational therapist Everlean Cherry [phone])[id number] that they need verbal for breathing exercise for once a week  for 6 week home health order was given by orthopedic dr Jenny Reichmann potty as per dr Devona Konig it ok for breathing exercise but also if pt is having difficulty breathing during exercise  she need go to ED

## 2021-01-15 DIAGNOSIS — E785 Hyperlipidemia, unspecified: Secondary | ICD-10-CM | POA: Diagnosis not present

## 2021-01-15 DIAGNOSIS — Z87891 Personal history of nicotine dependence: Secondary | ICD-10-CM | POA: Diagnosis not present

## 2021-01-15 DIAGNOSIS — I10 Essential (primary) hypertension: Secondary | ICD-10-CM | POA: Diagnosis not present

## 2021-01-15 DIAGNOSIS — M19041 Primary osteoarthritis, right hand: Secondary | ICD-10-CM | POA: Diagnosis not present

## 2021-01-15 DIAGNOSIS — M810 Age-related osteoporosis without current pathological fracture: Secondary | ICD-10-CM | POA: Diagnosis not present

## 2021-01-15 DIAGNOSIS — Z9981 Dependence on supplemental oxygen: Secondary | ICD-10-CM | POA: Diagnosis not present

## 2021-01-15 DIAGNOSIS — Z7951 Long term (current) use of inhaled steroids: Secondary | ICD-10-CM | POA: Diagnosis not present

## 2021-01-15 DIAGNOSIS — M19042 Primary osteoarthritis, left hand: Secondary | ICD-10-CM | POA: Diagnosis not present

## 2021-01-15 DIAGNOSIS — I2510 Atherosclerotic heart disease of native coronary artery without angina pectoris: Secondary | ICD-10-CM | POA: Diagnosis not present

## 2021-01-15 DIAGNOSIS — Z96612 Presence of left artificial shoulder joint: Secondary | ICD-10-CM | POA: Diagnosis not present

## 2021-01-15 DIAGNOSIS — Z90710 Acquired absence of both cervix and uterus: Secondary | ICD-10-CM | POA: Diagnosis not present

## 2021-01-15 DIAGNOSIS — Z9049 Acquired absence of other specified parts of digestive tract: Secondary | ICD-10-CM | POA: Diagnosis not present

## 2021-01-15 DIAGNOSIS — Z471 Aftercare following joint replacement surgery: Secondary | ICD-10-CM | POA: Diagnosis not present

## 2021-01-15 DIAGNOSIS — Q393 Congenital stenosis and stricture of esophagus: Secondary | ICD-10-CM | POA: Diagnosis not present

## 2021-01-15 DIAGNOSIS — Z9181 History of falling: Secondary | ICD-10-CM | POA: Diagnosis not present

## 2021-01-15 DIAGNOSIS — Z7982 Long term (current) use of aspirin: Secondary | ICD-10-CM | POA: Diagnosis not present

## 2021-01-15 DIAGNOSIS — J439 Emphysema, unspecified: Secondary | ICD-10-CM | POA: Diagnosis not present

## 2021-01-15 DIAGNOSIS — J9621 Acute and chronic respiratory failure with hypoxia: Secondary | ICD-10-CM | POA: Diagnosis not present

## 2021-01-15 DIAGNOSIS — K219 Gastro-esophageal reflux disease without esophagitis: Secondary | ICD-10-CM | POA: Diagnosis not present

## 2021-01-15 DIAGNOSIS — Z8701 Personal history of pneumonia (recurrent): Secondary | ICD-10-CM | POA: Diagnosis not present

## 2021-01-15 DIAGNOSIS — R1310 Dysphagia, unspecified: Secondary | ICD-10-CM | POA: Diagnosis not present

## 2021-01-15 DIAGNOSIS — F329 Major depressive disorder, single episode, unspecified: Secondary | ICD-10-CM | POA: Diagnosis not present

## 2021-01-27 DIAGNOSIS — Z90710 Acquired absence of both cervix and uterus: Secondary | ICD-10-CM | POA: Diagnosis not present

## 2021-01-27 DIAGNOSIS — Z9981 Dependence on supplemental oxygen: Secondary | ICD-10-CM | POA: Diagnosis not present

## 2021-01-27 DIAGNOSIS — Z7982 Long term (current) use of aspirin: Secondary | ICD-10-CM | POA: Diagnosis not present

## 2021-01-27 DIAGNOSIS — Z96612 Presence of left artificial shoulder joint: Secondary | ICD-10-CM | POA: Diagnosis not present

## 2021-01-27 DIAGNOSIS — J439 Emphysema, unspecified: Secondary | ICD-10-CM | POA: Diagnosis not present

## 2021-01-27 DIAGNOSIS — M19042 Primary osteoarthritis, left hand: Secondary | ICD-10-CM | POA: Diagnosis not present

## 2021-01-27 DIAGNOSIS — I10 Essential (primary) hypertension: Secondary | ICD-10-CM | POA: Diagnosis not present

## 2021-01-27 DIAGNOSIS — Z471 Aftercare following joint replacement surgery: Secondary | ICD-10-CM | POA: Diagnosis not present

## 2021-01-27 DIAGNOSIS — K219 Gastro-esophageal reflux disease without esophagitis: Secondary | ICD-10-CM | POA: Diagnosis not present

## 2021-01-27 DIAGNOSIS — R1310 Dysphagia, unspecified: Secondary | ICD-10-CM | POA: Diagnosis not present

## 2021-01-27 DIAGNOSIS — Q393 Congenital stenosis and stricture of esophagus: Secondary | ICD-10-CM | POA: Diagnosis not present

## 2021-01-27 DIAGNOSIS — J9621 Acute and chronic respiratory failure with hypoxia: Secondary | ICD-10-CM | POA: Diagnosis not present

## 2021-01-27 DIAGNOSIS — M19041 Primary osteoarthritis, right hand: Secondary | ICD-10-CM | POA: Diagnosis not present

## 2021-01-27 DIAGNOSIS — Z87891 Personal history of nicotine dependence: Secondary | ICD-10-CM | POA: Diagnosis not present

## 2021-01-27 DIAGNOSIS — Z8701 Personal history of pneumonia (recurrent): Secondary | ICD-10-CM | POA: Diagnosis not present

## 2021-01-27 DIAGNOSIS — Z7951 Long term (current) use of inhaled steroids: Secondary | ICD-10-CM | POA: Diagnosis not present

## 2021-01-27 DIAGNOSIS — F329 Major depressive disorder, single episode, unspecified: Secondary | ICD-10-CM | POA: Diagnosis not present

## 2021-01-27 DIAGNOSIS — M810 Age-related osteoporosis without current pathological fracture: Secondary | ICD-10-CM | POA: Diagnosis not present

## 2021-01-27 DIAGNOSIS — E785 Hyperlipidemia, unspecified: Secondary | ICD-10-CM | POA: Diagnosis not present

## 2021-01-27 DIAGNOSIS — Z9181 History of falling: Secondary | ICD-10-CM | POA: Diagnosis not present

## 2021-01-27 DIAGNOSIS — I2510 Atherosclerotic heart disease of native coronary artery without angina pectoris: Secondary | ICD-10-CM | POA: Diagnosis not present

## 2021-01-27 DIAGNOSIS — Z9049 Acquired absence of other specified parts of digestive tract: Secondary | ICD-10-CM | POA: Diagnosis not present

## 2021-02-11 DIAGNOSIS — D692 Other nonthrombocytopenic purpura: Secondary | ICD-10-CM | POA: Diagnosis not present

## 2021-02-11 DIAGNOSIS — L853 Xerosis cutis: Secondary | ICD-10-CM | POA: Diagnosis not present

## 2021-02-11 DIAGNOSIS — L821 Other seborrheic keratosis: Secondary | ICD-10-CM | POA: Diagnosis not present

## 2021-02-11 DIAGNOSIS — D2262 Melanocytic nevi of left upper limb, including shoulder: Secondary | ICD-10-CM | POA: Diagnosis not present

## 2021-02-11 DIAGNOSIS — D2261 Melanocytic nevi of right upper limb, including shoulder: Secondary | ICD-10-CM | POA: Diagnosis not present

## 2021-02-12 DIAGNOSIS — J439 Emphysema, unspecified: Secondary | ICD-10-CM | POA: Diagnosis not present

## 2021-02-12 DIAGNOSIS — M810 Age-related osteoporosis without current pathological fracture: Secondary | ICD-10-CM | POA: Diagnosis not present

## 2021-02-12 DIAGNOSIS — Z9181 History of falling: Secondary | ICD-10-CM | POA: Diagnosis not present

## 2021-02-12 DIAGNOSIS — M19041 Primary osteoarthritis, right hand: Secondary | ICD-10-CM | POA: Diagnosis not present

## 2021-02-12 DIAGNOSIS — M19042 Primary osteoarthritis, left hand: Secondary | ICD-10-CM | POA: Diagnosis not present

## 2021-02-12 DIAGNOSIS — I2510 Atherosclerotic heart disease of native coronary artery without angina pectoris: Secondary | ICD-10-CM | POA: Diagnosis not present

## 2021-02-12 DIAGNOSIS — Z9049 Acquired absence of other specified parts of digestive tract: Secondary | ICD-10-CM | POA: Diagnosis not present

## 2021-02-12 DIAGNOSIS — Z90710 Acquired absence of both cervix and uterus: Secondary | ICD-10-CM | POA: Diagnosis not present

## 2021-02-12 DIAGNOSIS — Z9981 Dependence on supplemental oxygen: Secondary | ICD-10-CM | POA: Diagnosis not present

## 2021-02-12 DIAGNOSIS — Z7951 Long term (current) use of inhaled steroids: Secondary | ICD-10-CM | POA: Diagnosis not present

## 2021-02-12 DIAGNOSIS — K219 Gastro-esophageal reflux disease without esophagitis: Secondary | ICD-10-CM | POA: Diagnosis not present

## 2021-02-12 DIAGNOSIS — Q393 Congenital stenosis and stricture of esophagus: Secondary | ICD-10-CM | POA: Diagnosis not present

## 2021-02-12 DIAGNOSIS — R1310 Dysphagia, unspecified: Secondary | ICD-10-CM | POA: Diagnosis not present

## 2021-02-12 DIAGNOSIS — Z96612 Presence of left artificial shoulder joint: Secondary | ICD-10-CM | POA: Diagnosis not present

## 2021-02-12 DIAGNOSIS — Z471 Aftercare following joint replacement surgery: Secondary | ICD-10-CM | POA: Diagnosis not present

## 2021-02-12 DIAGNOSIS — Z8701 Personal history of pneumonia (recurrent): Secondary | ICD-10-CM | POA: Diagnosis not present

## 2021-02-12 DIAGNOSIS — J9621 Acute and chronic respiratory failure with hypoxia: Secondary | ICD-10-CM | POA: Diagnosis not present

## 2021-02-12 DIAGNOSIS — F329 Major depressive disorder, single episode, unspecified: Secondary | ICD-10-CM | POA: Diagnosis not present

## 2021-02-12 DIAGNOSIS — Z7982 Long term (current) use of aspirin: Secondary | ICD-10-CM | POA: Diagnosis not present

## 2021-02-12 DIAGNOSIS — I10 Essential (primary) hypertension: Secondary | ICD-10-CM | POA: Diagnosis not present

## 2021-02-12 DIAGNOSIS — Z87891 Personal history of nicotine dependence: Secondary | ICD-10-CM | POA: Diagnosis not present

## 2021-02-12 DIAGNOSIS — E785 Hyperlipidemia, unspecified: Secondary | ICD-10-CM | POA: Diagnosis not present

## 2021-03-03 DIAGNOSIS — G720 Drug-induced myopathy: Secondary | ICD-10-CM | POA: Diagnosis not present

## 2021-03-03 DIAGNOSIS — M19012 Primary osteoarthritis, left shoulder: Secondary | ICD-10-CM | POA: Diagnosis not present

## 2021-03-03 DIAGNOSIS — R7309 Other abnormal glucose: Secondary | ICD-10-CM | POA: Diagnosis not present

## 2021-03-03 DIAGNOSIS — Z96612 Presence of left artificial shoulder joint: Secondary | ICD-10-CM | POA: Diagnosis not present

## 2021-03-03 DIAGNOSIS — E782 Mixed hyperlipidemia: Secondary | ICD-10-CM | POA: Diagnosis not present

## 2021-03-03 DIAGNOSIS — Z79899 Other long term (current) drug therapy: Secondary | ICD-10-CM | POA: Diagnosis not present

## 2021-03-03 DIAGNOSIS — Z23 Encounter for immunization: Secondary | ICD-10-CM | POA: Diagnosis not present

## 2021-03-03 DIAGNOSIS — J431 Panlobular emphysema: Secondary | ICD-10-CM | POA: Diagnosis not present

## 2021-03-03 DIAGNOSIS — I10 Essential (primary) hypertension: Secondary | ICD-10-CM | POA: Diagnosis not present

## 2021-03-03 DIAGNOSIS — T466X5A Adverse effect of antihyperlipidemic and antiarteriosclerotic drugs, initial encounter: Secondary | ICD-10-CM | POA: Diagnosis not present

## 2021-03-10 ENCOUNTER — Ambulatory Visit: Payer: PPO | Admitting: Internal Medicine

## 2021-03-10 ENCOUNTER — Other Ambulatory Visit: Payer: Self-pay

## 2021-03-10 ENCOUNTER — Encounter: Payer: Self-pay | Admitting: Internal Medicine

## 2021-03-10 VITALS — BP 124/78 | HR 79 | Temp 98.3°F | Resp 16 | Ht 62.0 in | Wt 127.2 lb

## 2021-03-10 DIAGNOSIS — Z9981 Dependence on supplemental oxygen: Secondary | ICD-10-CM

## 2021-03-10 DIAGNOSIS — J449 Chronic obstructive pulmonary disease, unspecified: Secondary | ICD-10-CM | POA: Diagnosis not present

## 2021-03-10 DIAGNOSIS — J479 Bronchiectasis, uncomplicated: Secondary | ICD-10-CM | POA: Diagnosis not present

## 2021-03-10 DIAGNOSIS — J9611 Chronic respiratory failure with hypoxia: Secondary | ICD-10-CM

## 2021-03-10 NOTE — Progress Notes (Signed)
[facility] [place], [place] [address]  Pulmonary Sleep Medicine   Office Visit Note  Patient Name: Joyce Rodriguez DOB: 1945-10-23 MRN [id number]  Date of Service: 03/10/2021  Complaints/HPI: COPD follow up. She had shoulder surgery done and she feels that she has not recovered fully. She states she is tired and no energy. Denies having cough some SOB is noted. She has no admissions to the hospital. Continues to use her oxygen as prescribed. She had a Ct scan done showed presence of L main and 3 vessel disease. Could this be contributing to her SOB. May need a followup cardiology evaluation but myoview in 2021 was normal seen by Dr Nehemiah Massed  ROS  General: (-) fever, (-) chills, (-) night sweats, (-) weakness Skin: (-) rashes, (-) itching,. Eyes: (-) visual changes, (-) redness, (-) itching. Nose and Sinuses: (-) nasal stuffiness or itchiness, (-) postnasal drip, (-) nosebleeds, (-) sinus trouble. Mouth and Throat: (-) sore throat, (-) hoarseness. Neck: (-) swollen glands, (-) enlarged thyroid, (-) neck pain. Respiratory: + cough, (-) bloody sputum, + shortness of breath, + wheezing. Cardiovascular: - ankle swelling, (-) chest pain. Lymphatic: (-) lymph node enlargement. Neurologic: (-) numbness, (-) tingling. Psychiatric: (-) anxiety, (-) depression   Current Medication: Outpatient Encounter Medications as of 03/10/2021  Medication Sig   acetaminophen (TYLENOL) 500 MG tablet Take 1,000 mg by mouth every 6 (six) hours as needed for moderate pain.   albuterol (VENTOLIN HFA) 108 (90 Base) MCG/ACT inhaler Inhale 2 puffs into the lungs every 6 (six) hours as needed. (Patient taking differently: Inhale 2 puffs into the lungs every 6 (six) hours as needed for wheezing or shortness of breath.)   aspirin EC 325 MG tablet Take 1 tablet (325 mg total) by mouth daily.   cholecalciferol (VITAMIN D) 1000 units tablet Take 1,000 Units by mouth daily.   Docusate Calcium  (STOOL SOFTENER PO) Take 2 tablets by mouth daily.   escitalopram (LEXAPRO) 10 MG tablet Take 1 tablet (10 mg total) by mouth daily. (Patient taking differently: Take 10 mg by mouth every morning.)   ezetimibe (ZETIA) 10 MG tablet Take 10 mg by mouth every morning.   fluticasone-salmeterol (ADVAIR) 250-50 MCG/ACT AEPB Inhale 1 puff into the lungs in the morning and at bedtime.   Multiple Vitamin (MULTIVITAMIN WITH MINERALS) TABS tablet Take 1 tablet by mouth daily.   omeprazole (PRILOSEC) 20 MG capsule Take 20 mg by mouth daily as needed (heartburn).   OXYGEN Inhale 2.5-3 L into the lungs at bedtime. 3 L portable   potassium chloride (K-DUR) 10 MEQ tablet Take 1 tablet (10 mEq total) by mouth daily.   predniSONE (STERAPRED UNI-PAK 48 TAB) 10 MG (48) TBPK tablet Use as directed for 12 days   propranolol ER (INDERAL LA) 60 MG 24 hr capsule Take 60 mg by mouth daily.   sulfamethoxazole-trimethoprim (BACTRIM DS) 800-160 MG tablet Take 1 tablet by mouth 2 (two) times daily.   tiotropium (SPIRIVA HANDIHALER) 18 MCG inhalation capsule Place 1 capsule (18 mcg total) into inhaler and inhale every morning.   traMADol (ULTRAM) 50 MG tablet Take 1-2 tablets (50-100 mg total) by mouth every 6 (six) hours as needed for moderate pain.   zinc gluconate 50 MG tablet Take 50 mg by mouth daily.   No facility-administered encounter medications on file as of 03/10/2021.    Surgical History: Past Surgical History:  Procedure Laterality Date   ABDOMINAL HYSTERECTOMY     CHOLECYSTECTOMY  ESOPHAGEAL MANOMETRY N/A 09/13/2017   Procedure: ESOPHAGEAL MANOMETRY (EM);  Surgeon: Mauri Pole, MD;  Location: [facility]OS[facility];  Service: Endoscopy;  Laterality: N/A;   ESOPHAGOGASTRODUODENOSCOPY (EGD) WITH PROPOFOL N/A 05/11/2017   Procedure: ESOPHAGOGASTRODUODENOSCOPY (EGD) WITH PROPOFOL;  Surgeon: Lin Landsman, MD;  Location: [facility] ENDOSCOPY;  Service: Gastroenterology;  Laterality: N/A;   EYE SURGERY Bilateral     CATARACTS   FOOT SURGERY     MORTONS NEUROMA   LAPAROSCOPIC NISSEN FUNDOPLICATION N/A 10/15/9447   Procedure: LAPAROSCOPIC NISSEN FUNDOPLICATION;  Surgeon: Jules Husbands, MD;  Location: [facility] ORS;  Service: General;  Laterality: N/A;   NISSEN FUNDOPLICATION N/A 09/18/5914   Procedure: NISSEN FUNDOPLICATION;  Surgeon: Jules Husbands, MD;  Location: [facility] ORS;  Service: General;  Laterality: N/A;   REVERSE SHOULDER ARTHROPLASTY Left 09/10/2020   Procedure: REVERSE SHOULDER ARTHROPLASTY;  Surgeon: Corky Mull, MD;  Location: [facility] ORS;  Service: Orthopedics;  Laterality: Left;    Medical History: Past Medical History:  Diagnosis Date   Aortic atherosclerosis ([place])    Arthritis    Complication of anesthesia    difficulty waking up   COPD (chronic obstructive pulmonary disease) (HCC)    Coronary artery disease    Dyspnea    Emphysema of lung (HCC)    SEVERE   GERD (gastroesophageal reflux disease)    Headache    H/O   History of hiatal hernia    Hyperlipidemia    Hypertension    H/O OFF MEDS SINCE 2020 BY PCP   Osteoporosis    Oxygen deficiency    uses O2 at night   Oxygen dependent    Pneumonia    Umbilical hernia     Family History: Family History  Problem Relation Age of Onset   Ovarian cancer Mother    Emphysema Father    Melanoma Sister    Lung cancer Brother     Social History: Social History   Socioeconomic History   Marital status: Married    Spouse name: Not on file   Number of children: Not on file   Years of education: 12   Highest education level: 12th grade  Occupational History   Occupation: retired  Tobacco Use   Smoking status: Former    Packs/day: 1.50    Years: 30.00    Pack years: 45.00    Types: Cigarettes    Quit date: 03/18/2001    Years since quitting: 19.9   Smokeless tobacco: Never  Vaping Use   Vaping Use: Never used  Substance and Sexual Activity   Alcohol use: Yes    Comment: rare   Drug use: No   Sexual activity: Not  Currently  Other Topics Concern   Not on file  Social History Narrative   Not on file   Social Determinants of Health   Financial Resource Strain: Not on file  Food Insecurity: Not on file  Transportation Needs: Not on file  Physical Activity: Not on file  Stress: Not on file  Social Connections: Not on file  Intimate Partner Violence: Not on file    Vital Signs: Blood pressure 124/78, pulse 79, temperature 98.3 F (36.8 C), resp. rate 16, height 5\' 2"  (1.575 m), weight 127 lb 3.2 oz (57.7 kg), SpO2 94 %.  Examination: General Appearance: The patient is well-developed, well-nourished, and in no distress. Skin: Gross inspection of skin unremarkable. Head: normocephalic, no gross deformities. Eyes: no gross deformities noted. ENT: ears appear grossly normal no exudates. Neck: Supple. No  thyromegaly. No LAD. Respiratory: no rhonchi noted distant BS. Cardiovascular: Normal S1 and S2 without murmur or rub. Extremities: No cyanosis. pulses are equal. Neurologic: Alert and oriented. No involuntary movements.  LABS: No results found for this or any previous visit (from the past 2160 hour(s)).  Radiology: CT Chest High Resolution  Result Date: 12/11/2020 CLINICAL DATA:  75 year old female with history of bronchiolectasis. EXAM: CT CHEST WITHOUT CONTRAST TECHNIQUE: Multidetector CT imaging of the chest was performed following the standard protocol without intravenous contrast. High resolution imaging of the lungs, as well as inspiratory and expiratory imaging, was performed. COMPARISON:  Chest x-ray 06/21/2020. FINDINGS: Cardiovascular: Heart size is normal. There is no significant pericardial fluid, thickening or pericardial calcification. There is aortic atherosclerosis, as well as atherosclerosis of the great vessels of the mediastinum and the coronary arteries, including calcified atherosclerotic plaque in the left main, left anterior descending, left circumflex and right coronary  arteries. Mediastinum/Nodes: No pathologically enlarged mediastinal or hilar lymph nodes. Esophagus is unremarkable in appearance. No axillary lymphadenopathy. Lungs/Pleura: Chronic mass-like area of architectural distortion and volume loss in the medial segment of the right middle lobe, similar to numerous prior examinations dating back to 2011, most compatible with an area of chronic post infectious or inflammatory scarring. Similar smaller lesion is also noted in the posterior aspect of the lingula. No other suspicious appearing pulmonary nodules or masses are noted. Scattered areas of mild cylindrical bronchiectasis and mucoid impaction are noted in the lungs bilaterally. No generalized areas of septal thickening, subpleural reticulation or honeycombing are noted to suggest interstitial lung disease. Diffuse bronchial wall thickening with severe centrilobular and paraseptal emphysema. No acute consolidative airspace disease. No pleural effusions. Upper Abdomen: Aortic atherosclerosis.  Status post cholecystectomy. Musculoskeletal: There are no aggressive appearing lytic or blastic lesions noted in the visualized portions of the skeleton. Status post left shoulder arthroplasty. IMPRESSION: 1. No findings to suggest interstitial lung disease. 2. Scattered areas of bronchiectasis and post infectious or inflammatory scarring, similar to prior studies, as above. 3. Diffuse bronchial wall thickening with severe centrilobular and paraseptal emphysema; imaging findings compatible with reported clinical history of COPD. 4. Aortic atherosclerosis, in addition to left main and 3 vessel coronary artery disease. Assessment for potential risk factor modification, dietary therapy or pharmacologic therapy may be warranted, if clinically indicated. Aortic Atherosclerosis (ICD10-I70.0) and Emphysema (ICD10-J43.9). Electronically Signed   By: Vinnie Langton M.D.   On: 12/11/2020 14:52    No results found.  No results  found.    Assessment and Plan: Patient Active Problem List   Diagnosis Date Noted   Status post reverse arthroplasty of shoulder, left 09/10/2020   Primary osteoarthritis of left shoulder 12/04/2019   Primary osteoarthritis of right shoulder 12/04/2019   Rotator cuff tendinitis, left 12/04/2019   Statin myopathy 19/41/7408   Umbilical hernia with obstruction, without gangrene 05/31/2019   Panlobular emphysema ([place]) 05/08/2019   Recurrent major depressive disorder, in partial remission ([place]) 05/08/2019   Obstructive chronic bronchitis with exacerbation ([place]) 01/30/2019   Oxygen dependent 01/30/2019   S/P Nissen fundoplication (with gastrostomy tube placement) ([place]) 10/19/2017   CAD (coronary artery disease), native coronary artery 10/18/2017   Hiatal hernia 10/15/2017   Abnormal EKG 10/15/2017   Dysphagia    Schatzki's ring    Gastric erosion    Advanced care planning/counseling discussion 04/21/2017   Acute on chronic respiratory failure with hypoxia ([place]) 05/08/2016   COPD exacerbation ([place]) 05/08/2016   Hyperglycemia 05/08/2016   Dyspnea 05/06/2016  Encounter for hepatitis C screening test for low risk patient 03/19/2016   Essential hypertension 03/19/2015   COPD (chronic obstructive pulmonary disease) ([place]) 03/19/2015   Hyperlipidemia 03/19/2015   Osteoarthritis of both hands 03/19/2015   Osteoporosis 03/19/2015   Acid reflux 03/19/2015   Major depression, chronic 09/12/2014    1. Chronic obstructive pulmonary disease, unspecified COPD type ([place]) She may benefit from [facility] rehab but she has concerns about gas cost and prices expenses. She will think about it  2. Chronic respiratory failure with hypoxia (HCC) On oxygen  3. Oxygen dependent 3lpm at this time. May need to turn u pto 4lpm with exertion  4. Bronchiectasis without complication ([place]) No exacerbation at this time   General Counseling: I have discussed the findings of the evaluation and examination with  Dorian Pod.  I have also discussed any further diagnostic evaluation thatmay be needed or ordered today. Jodell verbalizes understanding of the findings of todays visit. We also reviewed her medications today and discussed drug interactions and side effects including but not limited excessive drowsiness and altered mental states. We also discussed that there is always a risk not just to her but also people around her. she has been encouraged to call the office with any questions or concerns that should arise related to todays visit.  No orders of the defined types were placed in this encounter.    Time spent: 25  I have personally obtained a history, examined the patient, evaluated laboratory and imaging results, formulated the assessment and plan and placed orders.    Allyne Gee, MD [facility] Pulmonary and Critical Care Sleep medicine

## 2021-03-10 NOTE — Patient Instructions (Signed)

## 2021-05-05 ENCOUNTER — Ambulatory Visit: Payer: PPO | Admitting: Internal Medicine

## 2021-05-05 ENCOUNTER — Encounter: Payer: Self-pay | Admitting: Internal Medicine

## 2021-05-05 ENCOUNTER — Other Ambulatory Visit: Payer: Self-pay

## 2021-05-05 VITALS — BP 150/80 | HR 77 | Temp 98.1°F | Resp 16 | Ht 62.0 in | Wt 129.8 lb

## 2021-05-05 DIAGNOSIS — J449 Chronic obstructive pulmonary disease, unspecified: Secondary | ICD-10-CM

## 2021-05-05 DIAGNOSIS — J479 Bronchiectasis, uncomplicated: Secondary | ICD-10-CM | POA: Diagnosis not present

## 2021-05-05 DIAGNOSIS — J9611 Chronic respiratory failure with hypoxia: Secondary | ICD-10-CM | POA: Diagnosis not present

## 2021-05-05 DIAGNOSIS — R0602 Shortness of breath: Secondary | ICD-10-CM | POA: Diagnosis not present

## 2021-05-05 NOTE — Patient Instructions (Signed)

## 2021-05-05 NOTE — Progress Notes (Signed)
[facility] [place], [place] [address]  Pulmonary Sleep Medicine   Office Visit Note  Patient Name: Joyce Rodriguez DOB: May 15, 1945 MRN [id number]  Date of Service: 05/05/2021  Complaints/HPI: She had some bad teeth removed. States she is feeling better with her breathing. She states she is using her oxygen and feels good.  She has her baseline shortness of breath and cough noted.  She uses her medications as prescribed she is on albuterol short acting as well as Advair which seems to help her.  She also does Spiriva as a combination  ROS  General: (-) fever, (-) chills, (-) night sweats, (-) weakness Skin: (-) rashes, (-) itching,. Eyes: (-) visual changes, (-) redness, (-) itching. Nose and Sinuses: (-) nasal stuffiness or itchiness, (-) postnasal drip, (-) nosebleeds, (-) sinus trouble. Mouth and Throat: (-) sore throat, (-) hoarseness. Neck: (-) swollen glands, (-) enlarged thyroid, (-) neck pain. Respiratory: - cough, (-) bloody sputum, + shortness of breath, - wheezing. Cardiovascular: - ankle swelling, (-) chest pain. Lymphatic: (-) lymph node enlargement. Neurologic: (-) numbness, (-) tingling. Psychiatric: (-) anxiety, (-) depression   Current Medication: Outpatient Encounter Medications as of 05/05/2021  Medication Sig   acetaminophen (TYLENOL) 500 MG tablet Take 1,000 mg by mouth every 6 (six) hours as needed for moderate pain.   albuterol (VENTOLIN HFA) 108 (90 Base) MCG/ACT inhaler Inhale 2 puffs into the lungs every 6 (six) hours as needed. (Patient taking differently: Inhale 2 puffs into the lungs every 6 (six) hours as needed for wheezing or shortness of breath.)   aspirin EC 325 MG tablet Take 1 tablet (325 mg total) by mouth daily.   cholecalciferol (VITAMIN D) 1000 units tablet Take 1,000 Units by mouth daily.   Docusate Calcium (STOOL SOFTENER PO) Take 2 tablets by mouth daily.   escitalopram (LEXAPRO) 10 MG tablet Take 1 tablet (10 mg  total) by mouth daily. (Patient taking differently: Take 10 mg by mouth every morning.)   ezetimibe (ZETIA) 10 MG tablet Take 10 mg by mouth every morning.   fluticasone-salmeterol (ADVAIR) 250-50 MCG/ACT AEPB Inhale 1 puff into the lungs in the morning and at bedtime.   Multiple Vitamin (MULTIVITAMIN WITH MINERALS) TABS tablet Take 1 tablet by mouth daily.   omeprazole (PRILOSEC) 20 MG capsule Take 20 mg by mouth daily as needed (heartburn).   OXYGEN Inhale 2.5-3 L into the lungs at bedtime. 3 L portable   potassium chloride (K-DUR) 10 MEQ tablet Take 1 tablet (10 mEq total) by mouth daily.   predniSONE (STERAPRED UNI-PAK 48 TAB) 10 MG (48) TBPK tablet Use as directed for 12 days   propranolol ER (INDERAL LA) 60 MG 24 hr capsule Take 60 mg by mouth daily.   sulfamethoxazole-trimethoprim (BACTRIM DS) 800-160 MG tablet Take 1 tablet by mouth 2 (two) times daily.   tiotropium (SPIRIVA HANDIHALER) 18 MCG inhalation capsule Place 1 capsule (18 mcg total) into inhaler and inhale every morning.   traMADol (ULTRAM) 50 MG tablet Take 1-2 tablets (50-100 mg total) by mouth every 6 (six) hours as needed for moderate pain.   zinc gluconate 50 MG tablet Take 50 mg by mouth daily.   No facility-administered encounter medications on file as of 05/05/2021.    Surgical History: Past Surgical History:  Procedure Laterality Date   ABDOMINAL HYSTERECTOMY     CHOLECYSTECTOMY     ESOPHAGEAL MANOMETRY N/A 09/13/2017   Procedure: ESOPHAGEAL MANOMETRY (EM);  Surgeon: Mauri Pole, MD;  Location:  [facility];  Service: Endoscopy;  Laterality: N/A;   ESOPHAGOGASTRODUODENOSCOPY (EGD) WITH PROPOFOL N/A 05/11/2017   Procedure: ESOPHAGOGASTRODUODENOSCOPY (EGD) WITH PROPOFOL;  Surgeon: Lin Landsman, MD;  Location: [facility] ENDOSCOPY;  Service: Gastroenterology;  Laterality: N/A;   EYE SURGERY Bilateral    CATARACTS   FOOT SURGERY     MORTONS NEUROMA   LAPAROSCOPIC NISSEN FUNDOPLICATION N/A 04/21/2991    Procedure: LAPAROSCOPIC NISSEN FUNDOPLICATION;  Surgeon: Jules Husbands, MD;  Location: [facility] ORS;  Service: General;  Laterality: N/A;   NISSEN FUNDOPLICATION N/A 10/11/6965   Procedure: NISSEN FUNDOPLICATION;  Surgeon: Jules Husbands, MD;  Location: [facility] ORS;  Service: General;  Laterality: N/A;   REVERSE SHOULDER ARTHROPLASTY Left 09/10/2020   Procedure: REVERSE SHOULDER ARTHROPLASTY;  Surgeon: Corky Mull, MD;  Location: [facility] ORS;  Service: Orthopedics;  Laterality: Left;    Medical History: Past Medical History:  Diagnosis Date   Aortic atherosclerosis ([place])    Arthritis    Complication of anesthesia    difficulty waking up   COPD (chronic obstructive pulmonary disease) (HCC)    Coronary artery disease    Dyspnea    Emphysema of lung (HCC)    SEVERE   GERD (gastroesophageal reflux disease)    Headache    H/O   History of hiatal hernia    Hyperlipidemia    Hypertension    H/O OFF MEDS SINCE 2020 BY PCP   Osteoporosis    Oxygen deficiency    uses O2 at night   Oxygen dependent    Pneumonia    Umbilical hernia     Family History: Family History  Problem Relation Age of Onset   Ovarian cancer Mother    Emphysema Father    Melanoma Sister    Lung cancer Brother     Social History: Social History   Socioeconomic History   Marital status: Married    Spouse name: Not on file   Number of children: Not on file   Years of education: 12   Highest education level: 12th grade  Occupational History   Occupation: retired  Tobacco Use   Smoking status: Former    Packs/day: 1.50    Years: 30.00    Pack years: 45.00    Types: Cigarettes    Quit date: 03/18/2001    Years since quitting: 20.1   Smokeless tobacco: Never  Vaping Use   Vaping Use: Never used  Substance and Sexual Activity   Alcohol use: Yes    Comment: rare   Drug use: No   Sexual activity: Not Currently  Other Topics Concern   Not on file  Social History Narrative   Not on file   Social  Determinants of Health   Financial Resource Strain: Not on file  Food Insecurity: Not on file  Transportation Needs: Not on file  Physical Activity: Not on file  Stress: Not on file  Social Connections: Not on file  Intimate Partner Violence: Not on file    Vital Signs: Blood pressure (!) 150/80, pulse 77, temperature 98.1 F (36.7 C), resp. rate 16, height 5\' 2"  (1.575 m), weight 129 lb 12.8 oz (58.9 kg), SpO2 (!) 83 %.  Examination: General Appearance: The patient is well-developed, well-nourished, and in no distress. Skin: Gross inspection of skin unremarkable. Head: normocephalic, no gross deformities. Eyes: no gross deformities noted. ENT: ears appear grossly normal no exudates. Neck: Supple. No thyromegaly. No LAD. Respiratory: no rhonchi noted. Cardiovascular: Normal S1 and S2 without murmur or rub.  Extremities: No cyanosis. pulses are equal. Neurologic: Alert and oriented. No involuntary movements.  LABS: No results found for this or any previous visit (from the past 2160 hour(s)).  Radiology: CT Chest High Resolution  Result Date: 12/11/2020 CLINICAL DATA:  77 year old female with history of bronchiolectasis. EXAM: CT CHEST WITHOUT CONTRAST TECHNIQUE: Multidetector CT imaging of the chest was performed following the standard protocol without intravenous contrast. High resolution imaging of the lungs, as well as inspiratory and expiratory imaging, was performed. COMPARISON:  Chest x-ray 06/21/2020. FINDINGS: Cardiovascular: Heart size is normal. There is no significant pericardial fluid, thickening or pericardial calcification. There is aortic atherosclerosis, as well as atherosclerosis of the great vessels of the mediastinum and the coronary arteries, including calcified atherosclerotic plaque in the left main, left anterior descending, left circumflex and right coronary arteries. Mediastinum/Nodes: No pathologically enlarged mediastinal or hilar lymph nodes. Esophagus is  unremarkable in appearance. No axillary lymphadenopathy. Lungs/Pleura: Chronic mass-like area of architectural distortion and volume loss in the medial segment of the right middle lobe, similar to numerous prior examinations dating back to 2011, most compatible with an area of chronic post infectious or inflammatory scarring. Similar smaller lesion is also noted in the posterior aspect of the lingula. No other suspicious appearing pulmonary nodules or masses are noted. Scattered areas of mild cylindrical bronchiectasis and mucoid impaction are noted in the lungs bilaterally. No generalized areas of septal thickening, subpleural reticulation or honeycombing are noted to suggest interstitial lung disease. Diffuse bronchial wall thickening with severe centrilobular and paraseptal emphysema. No acute consolidative airspace disease. No pleural effusions. Upper Abdomen: Aortic atherosclerosis.  Status post cholecystectomy. Musculoskeletal: There are no aggressive appearing lytic or blastic lesions noted in the visualized portions of the skeleton. Status post left shoulder arthroplasty. IMPRESSION: 1. No findings to suggest interstitial lung disease. 2. Scattered areas of bronchiectasis and post infectious or inflammatory scarring, similar to prior studies, as above. 3. Diffuse bronchial wall thickening with severe centrilobular and paraseptal emphysema; imaging findings compatible with reported clinical history of COPD. 4. Aortic atherosclerosis, in addition to left main and 3 vessel coronary artery disease. Assessment for potential risk factor modification, dietary therapy or pharmacologic therapy may be warranted, if clinically indicated. Aortic Atherosclerosis (ICD10-I70.0) and Emphysema (ICD10-J43.9). Electronically Signed   By: Vinnie Langton M.D.   On: 12/11/2020 14:52    No results found.  No results found.    Assessment and Plan: Patient Active Problem List   Diagnosis Date Noted   Status post  reverse arthroplasty of shoulder, left 09/10/2020   Primary osteoarthritis of left shoulder 12/04/2019   Primary osteoarthritis of right shoulder 12/04/2019   Rotator cuff tendinitis, left 12/04/2019   Statin myopathy 38/17/7116   Umbilical hernia with obstruction, without gangrene 05/31/2019   Panlobular emphysema ([place]) 05/08/2019   Recurrent major depressive disorder, in partial remission ([place]) 05/08/2019   Obstructive chronic bronchitis with exacerbation ([place]) 01/30/2019   Oxygen dependent 01/30/2019   S/P Nissen fundoplication (with gastrostomy tube placement) ([place]) 10/19/2017   CAD (coronary artery disease), native coronary artery 10/18/2017   Hiatal hernia 10/15/2017   Abnormal EKG 10/15/2017   Dysphagia    Schatzki's ring    Gastric erosion    Advanced care planning/counseling discussion 04/21/2017   Acute on chronic respiratory failure with hypoxia ([place]) 05/08/2016   COPD exacerbation ([place]) 05/08/2016   Hyperglycemia 05/08/2016   Dyspnea 05/06/2016   Encounter for hepatitis C screening test for low risk patient 03/19/2016   Essential hypertension 03/19/2015  COPD (chronic obstructive pulmonary disease) ([place]) 03/19/2015   Hyperlipidemia 03/19/2015   Osteoarthritis of both hands 03/19/2015   Osteoporosis 03/19/2015   Acid reflux 03/19/2015   Major depression, chronic 09/12/2014    1. SOB (shortness of breath) She seems to be at baseline plan is going to be to get a spirometry today for reassessment of severity and progression of her disease - Spirometry with Graph  2. Chronic obstructive pulmonary disease, unspecified COPD type ([place]) Continue with inhalers albuterol Spiriva Advair  3. Bronchiectasis without complication ([place]) Chronic bronchiectasis she has not had any flareups the plan is to continue to monitor.  If she has any changes she knows to give [place] a call or to start her Bactrim before she comes in to see  4. Chronic respiratory failure with hypoxia (HCC) On  oxygen therapy this should be continued.  General Counseling: I have discussed the findings of the evaluation and examination with Dorian Pod.  I have also discussed any further diagnostic evaluation thatmay be needed or ordered today. Persis verbalizes understanding of the findings of todays visit. We also reviewed her medications today and discussed drug interactions and side effects including but not limited excessive drowsiness and altered mental states. We also discussed that there is always a risk not just to her but also people around her. she has been encouraged to call the office with any questions or concerns that should arise related to todays visit.  Orders Placed This Encounter  Procedures   Spirometry with Graph    Order Specific Question:   Where should this test be performed?    Answer:   [facility]    Order Specific Question:   Basic spirometry    Answer:   Yes     Time spent:45  I have personally obtained a history, examined the patient, evaluated laboratory and imaging results, formulated the assessment and plan and placed orders.    Allyne Gee, MD [facility] Pulmonary and Critical Care Sleep medicine

## 2021-05-19 ENCOUNTER — Other Ambulatory Visit: Payer: Self-pay

## 2021-05-19 MED ORDER — OMEPRAZOLE 20 MG PO CPDR: 20.0000 mg | DELAYED_RELEASE_CAPSULE | Freq: Every day | ORAL | 1 refills | Status: DC | PRN

## 2021-05-26 DIAGNOSIS — E782 Mixed hyperlipidemia: Secondary | ICD-10-CM | POA: Diagnosis not present

## 2021-05-26 DIAGNOSIS — Z79899 Other long term (current) drug therapy: Secondary | ICD-10-CM | POA: Diagnosis not present

## 2021-05-26 DIAGNOSIS — R7309 Other abnormal glucose: Secondary | ICD-10-CM | POA: Diagnosis not present

## 2021-05-26 DIAGNOSIS — I10 Essential (primary) hypertension: Secondary | ICD-10-CM | POA: Diagnosis not present

## 2021-06-02 ENCOUNTER — Other Ambulatory Visit: Payer: Self-pay

## 2021-06-02 MED ORDER — FLUTICASONE-SALMETEROL 250-50 MCG/ACT IN AEPB: 1.0000 | INHALATION_SPRAY | Freq: Two times a day (BID) | RESPIRATORY_TRACT | 1 refills | Status: DC

## 2021-06-04 DIAGNOSIS — I10 Essential (primary) hypertension: Secondary | ICD-10-CM | POA: Diagnosis not present

## 2021-06-04 DIAGNOSIS — E782 Mixed hyperlipidemia: Secondary | ICD-10-CM | POA: Diagnosis not present

## 2021-06-04 DIAGNOSIS — J431 Panlobular emphysema: Secondary | ICD-10-CM | POA: Diagnosis not present

## 2021-06-04 DIAGNOSIS — G720 Drug-induced myopathy: Secondary | ICD-10-CM | POA: Diagnosis not present

## 2021-06-04 DIAGNOSIS — Z0000 Encounter for general adult medical examination without abnormal findings: Secondary | ICD-10-CM | POA: Diagnosis not present

## 2021-06-04 DIAGNOSIS — Z1231 Encounter for screening mammogram for malignant neoplasm of breast: Secondary | ICD-10-CM | POA: Diagnosis not present

## 2021-06-04 DIAGNOSIS — T466X5A Adverse effect of antihyperlipidemic and antiarteriosclerotic drugs, initial encounter: Secondary | ICD-10-CM | POA: Diagnosis not present

## 2021-06-04 DIAGNOSIS — F3341 Major depressive disorder, recurrent, in partial remission: Secondary | ICD-10-CM | POA: Diagnosis not present

## 2021-07-14 ENCOUNTER — Telehealth: Payer: Self-pay

## 2021-07-14 NOTE — Telephone Encounter (Signed)
Oxygen order signed by provider and placed in [facility] folder. ?

## 2021-07-17 IMAGING — CT CT CHEST W/O CM
2 of 4 series · 15 of 36 positions shown, 18 images · non-contrast
Comparison: Chest CT 11/08/2018.

CLINICAL DATA: 74-year-old female with history of atypical
pneumonia. COPD with shortness of breath. On oxygen at night. Former
smoker (quit 20 years ago). Follow-up study.

EXAM:
CT CHEST WITHOUT CONTRAST
TECHNIQUE: Multidetector CT imaging of the chest was performed following the
standard protocol without IV contrast.

[Series 2: chest 2.00 · axial · 0.60mm/px · z∈[-1216,-932]mm · 12 of 168 slices shown, 15 images]
[im 13/168  mediastinal]
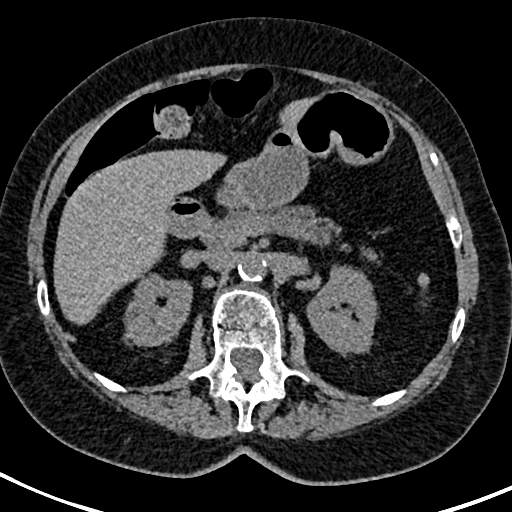
[im 13/168  lung]
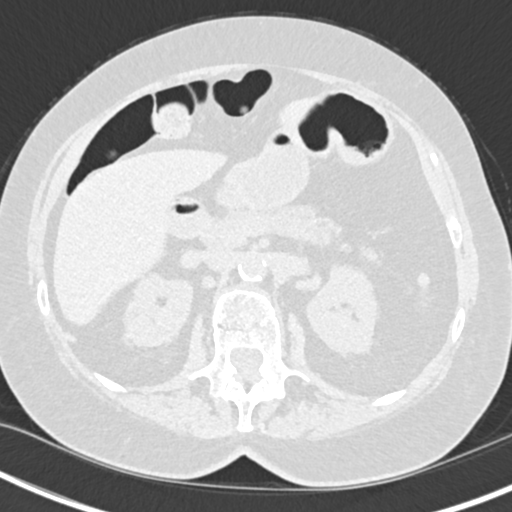
[im 26/168  lung]
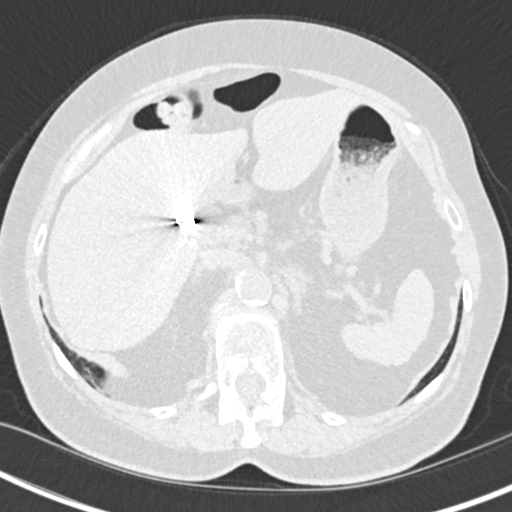
[im 39/168  lung]
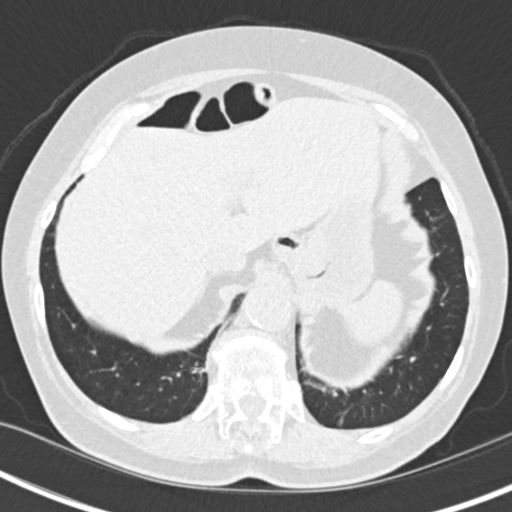
[im 52/168  lung]
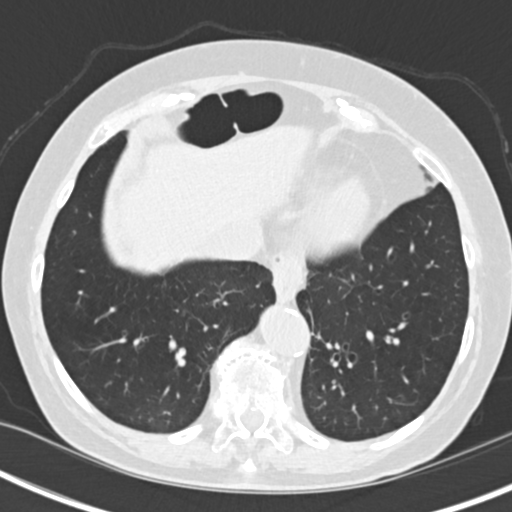
[im 65/168  mediastinal]
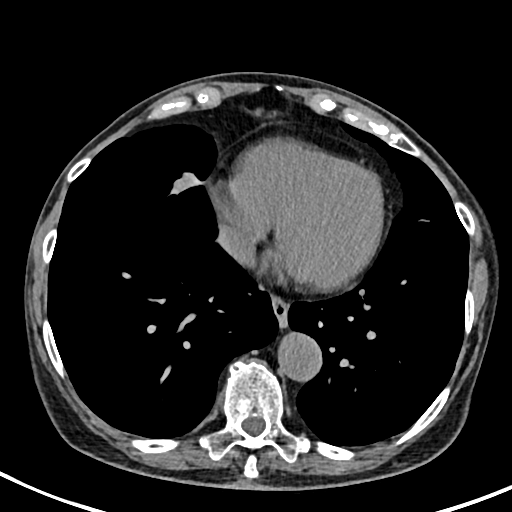
[im 65/168  lung]
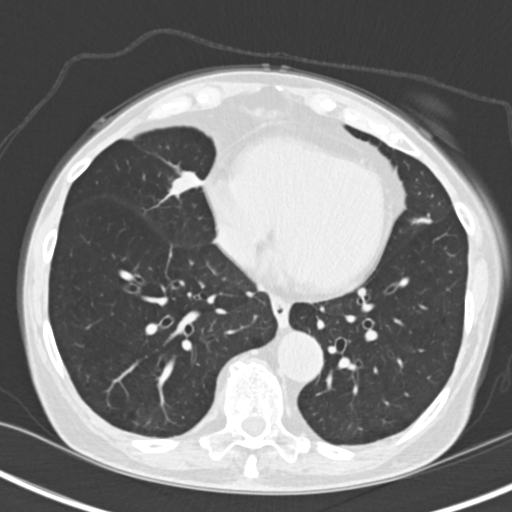
[im 78/168  lung]
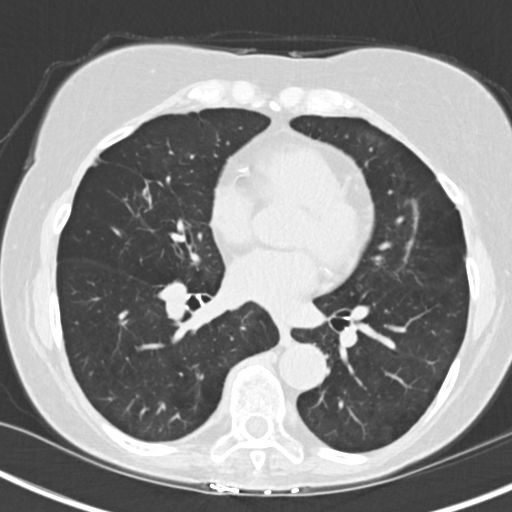
[im 90/168  lung]
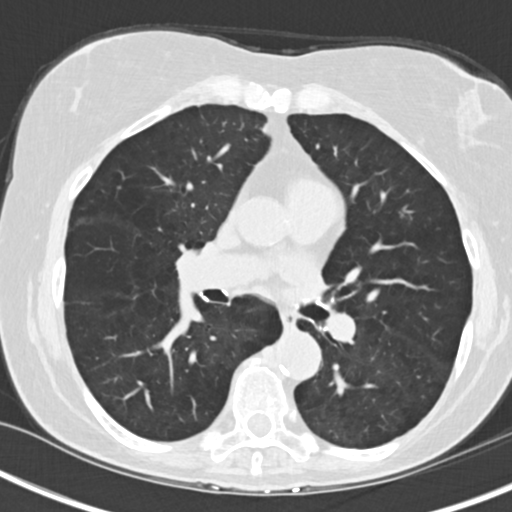
[im 103/168  lung]
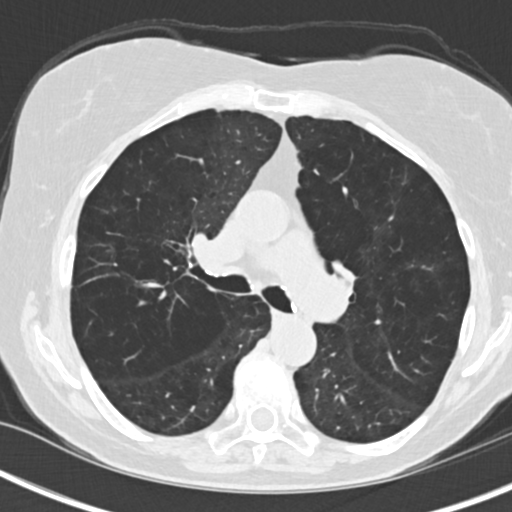
[im 116/168  mediastinal]
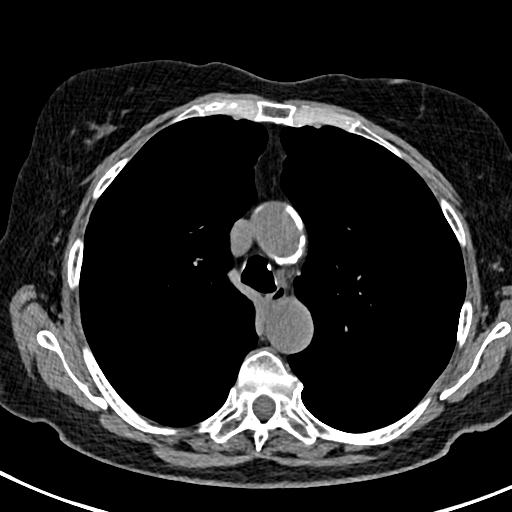
[im 116/168  lung]
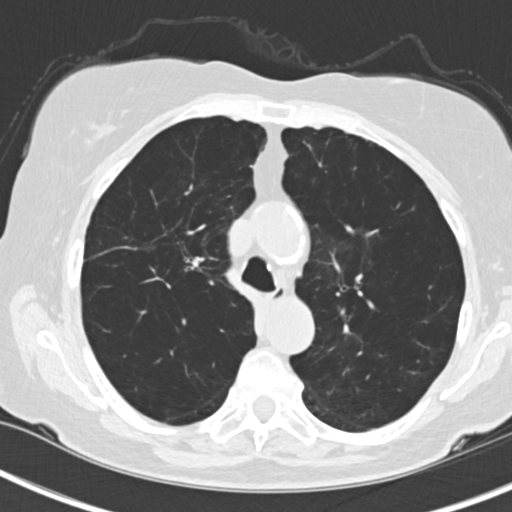
[im 129/168  lung]
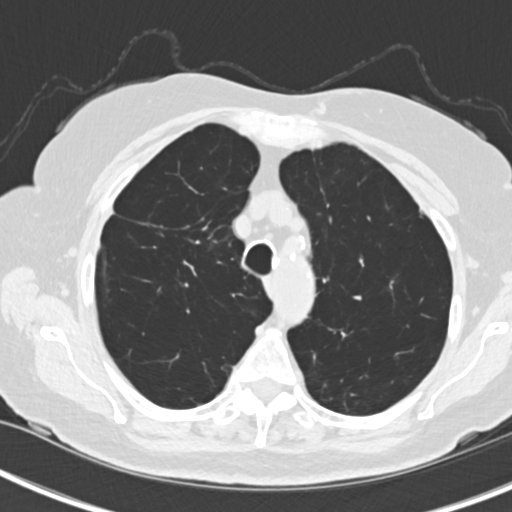
[im 142/168  lung]
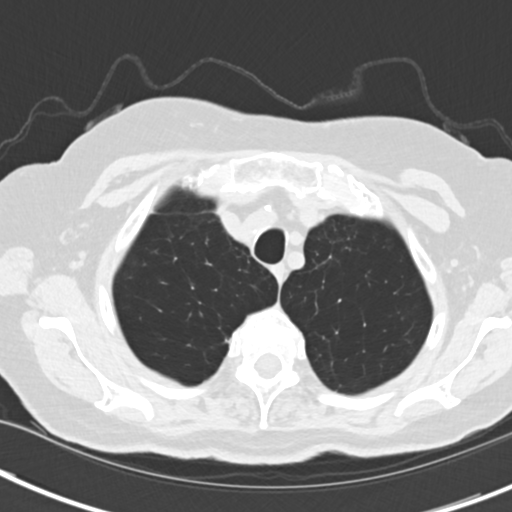
[im 155/168  lung]
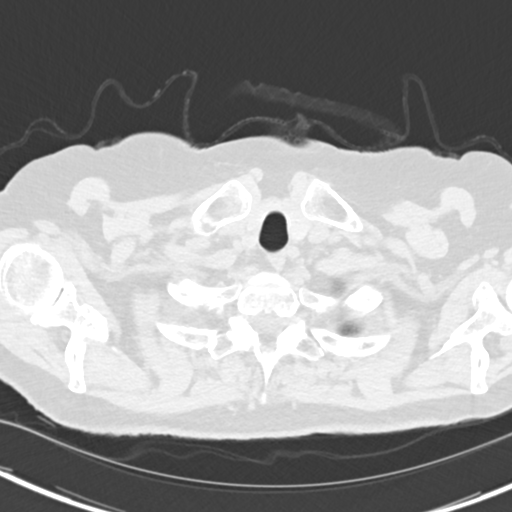

[Series 5: coronals chest 2.00 cor · coronal · 0.57mm/px · 3 of 152 slices shown]
[im 31/152  lung]
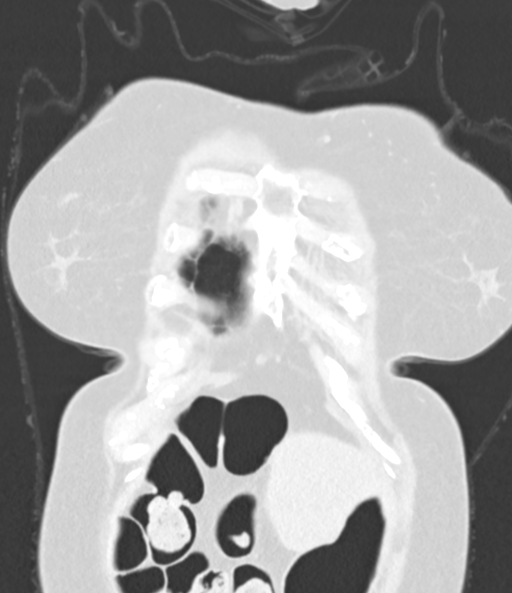
[im 61/152  lung]
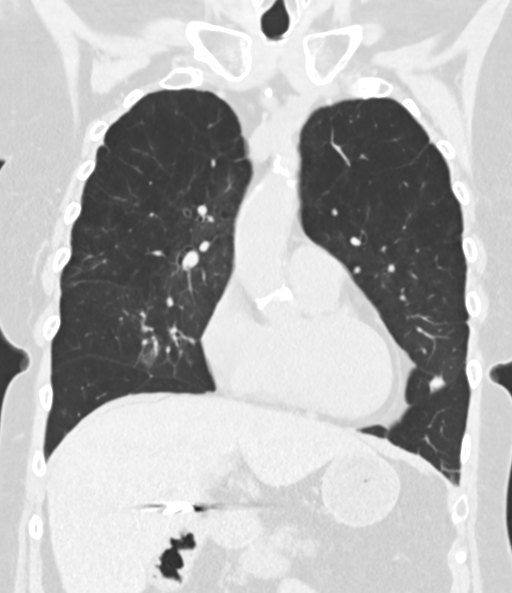
[im 91/152  lung]
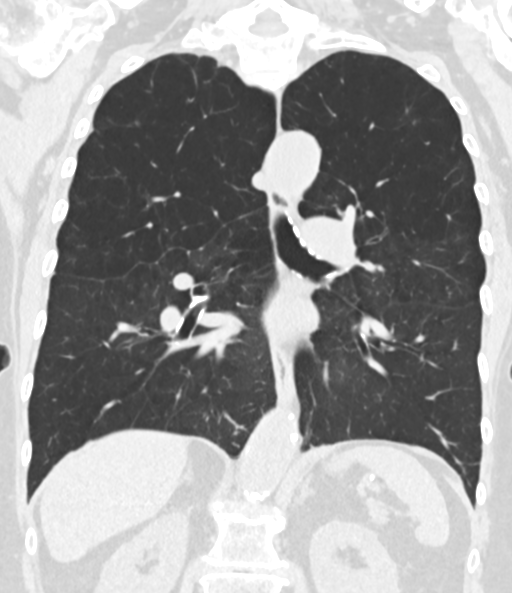

[15 of 36 positions shown; findings below may reference images not displayed]

FINDINGS: Cardiovascular: Heart size is normal. There is no significant
pericardial fluid, thickening or pericardial calcification. There is
aortic atherosclerosis, as well as atherosclerosis of the great
vessels of the mediastinum and the coronary arteries, including
calcified atherosclerotic plaque in the left main, left anterior
descending, left circumflex and right coronary arteries.

Mediastinum/Nodes: No pathologically enlarged mediastinal or hilar
lymph nodes. Please note that accurate exclusion of hilar adenopathy
is limited on noncontrast CT scans. Small hiatal hernia. No axillary
lymphadenopathy.

Lungs/Pleura: No acute consolidative airspace disease. No pleural
effusions. No definite suspicious appearing pulmonary nodules or
masses are noted. Diffuse bronchial wall thickening with severe
centrilobular and paraseptal emphysema. Chronic areas of
architectural distortion and volume loss are noted in the inferior
segment of the lingula and the right middle lobe, most compatible
with areas of chronic post infectious or inflammatory scarring, very
similar to remote prior study from 11/08/2018.

Upper Abdomen: Aortic atherosclerosis.  Status post cholecystectomy.

Musculoskeletal: There are no aggressive appearing lytic or blastic
lesions noted in the visualized portions of the skeleton.
IMPRESSION: 1. No acute findings are noted on today's examination.
2. Chronic post infectious or inflammatory scarring in the right
middle lobe and inferior segment of the lingula.
3. Diffuse bronchial wall thickening with severe centrilobular and
paraseptal emphysema; imaging findings compatible with clinical
history of underlying COPD.
4. Aortic atherosclerosis, in addition to left main and 3 vessel
coronary artery disease. Assessment for potential risk factor
modification, dietary therapy or pharmacologic therapy may be
warranted, if clinically indicated.

Aortic Atherosclerosis (62QCP-FLW.W) and Emphysema (62QCP-OC5.4).

## 2021-07-29 NOTE — Progress Notes (Unsigned)
Pt came in for office visit for pulmonary.  Her oxygen on room air was 83%.  We placed patient on 3 liters of oxygen and O2 was at 95%.

## 2021-07-31 DIAGNOSIS — Z1231 Encounter for screening mammogram for malignant neoplasm of breast: Secondary | ICD-10-CM | POA: Diagnosis not present

## 2021-08-21 ENCOUNTER — Other Ambulatory Visit: Payer: Self-pay

## 2021-08-21 MED ORDER — FLUTICASONE-SALMETEROL 250-50 MCG/ACT IN AEPB: 1.0000 | INHALATION_SPRAY | Freq: Two times a day (BID) | RESPIRATORY_TRACT | 1 refills | Status: DC

## 2021-09-02 DIAGNOSIS — Z961 Presence of intraocular lens: Secondary | ICD-10-CM | POA: Diagnosis not present

## 2021-09-11 ENCOUNTER — Ambulatory Visit: Payer: PPO | Admitting: Internal Medicine

## 2021-10-02 DIAGNOSIS — R0602 Shortness of breath: Secondary | ICD-10-CM | POA: Diagnosis not present

## 2021-10-02 DIAGNOSIS — G720 Drug-induced myopathy: Secondary | ICD-10-CM | POA: Diagnosis not present

## 2021-10-02 DIAGNOSIS — F3341 Major depressive disorder, recurrent, in partial remission: Secondary | ICD-10-CM | POA: Diagnosis not present

## 2021-10-02 DIAGNOSIS — I10 Essential (primary) hypertension: Secondary | ICD-10-CM | POA: Diagnosis not present

## 2021-10-02 DIAGNOSIS — J431 Panlobular emphysema: Secondary | ICD-10-CM | POA: Diagnosis not present

## 2021-10-02 DIAGNOSIS — R7309 Other abnormal glucose: Secondary | ICD-10-CM | POA: Diagnosis not present

## 2021-10-02 DIAGNOSIS — Z79899 Other long term (current) drug therapy: Secondary | ICD-10-CM | POA: Diagnosis not present

## 2021-10-02 DIAGNOSIS — Z0000 Encounter for general adult medical examination without abnormal findings: Secondary | ICD-10-CM | POA: Diagnosis not present

## 2021-10-02 DIAGNOSIS — E782 Mixed hyperlipidemia: Secondary | ICD-10-CM | POA: Diagnosis not present

## 2021-10-02 DIAGNOSIS — R079 Chest pain, unspecified: Secondary | ICD-10-CM | POA: Diagnosis not present

## 2021-10-02 DIAGNOSIS — H539 Unspecified visual disturbance: Secondary | ICD-10-CM | POA: Diagnosis not present

## 2021-10-02 DIAGNOSIS — Z1211 Encounter for screening for malignant neoplasm of colon: Secondary | ICD-10-CM | POA: Diagnosis not present

## 2021-10-02 IMAGING — CT CT SHOULDER*L* W/O CM
1 of 3 series · 6 of 14 positions shown, 8 images · non-contrast
Comparison: Plain films left shoulder 09/10/2020.

CLINICAL DATA: Preoperative planning examination. Chronic left
shoulder pain.

EXAM:
CT OF THE UPPER LEFT EXTREMITY WITHOUT CONTRAST
TECHNIQUE: Multidetector CT imaging of the upper left extremity was performed
according to the standard protocol.

[Series 4: (person_name) cd shoulder 0.80 · axial · 0.43mm/px · z∈[-977,-861]mm · 6 of 204 slices shown, 8 images]
[im 30/204  soft-tissue]
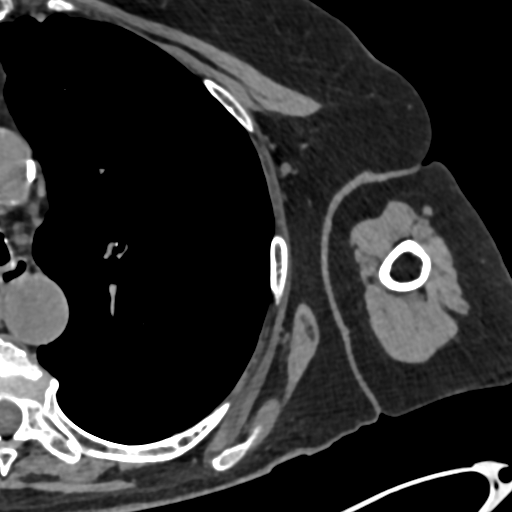
[im 30/204  bone]
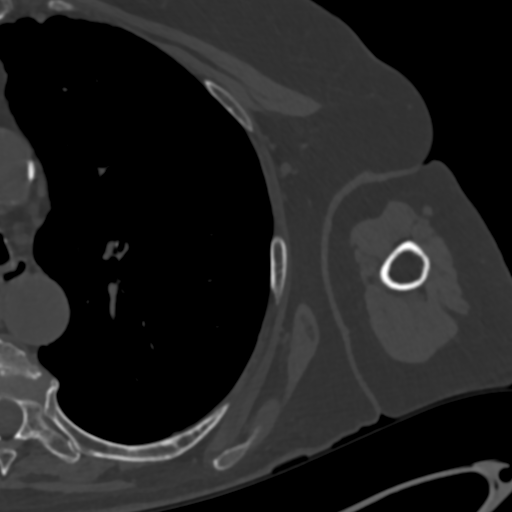
[im 59/204  bone]
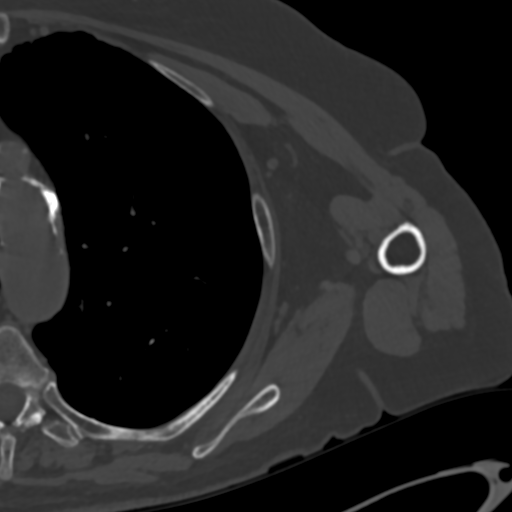
[im 88/204  bone]
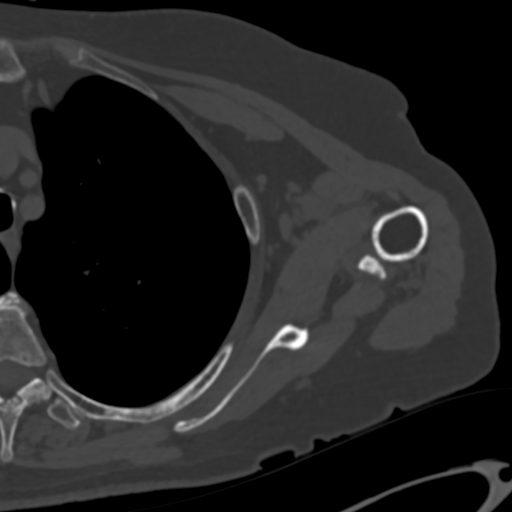
[im 117/204  bone]
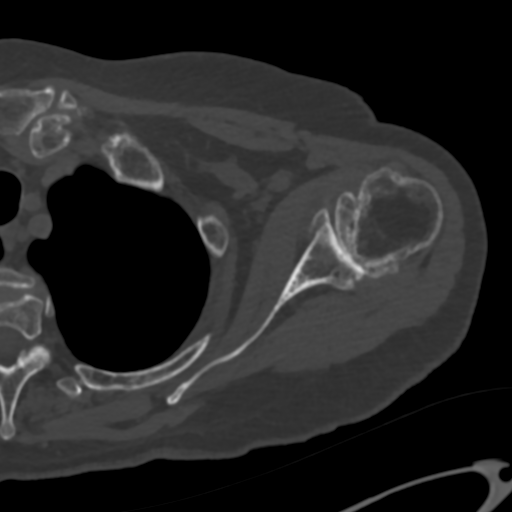
[im 146/204  soft-tissue]
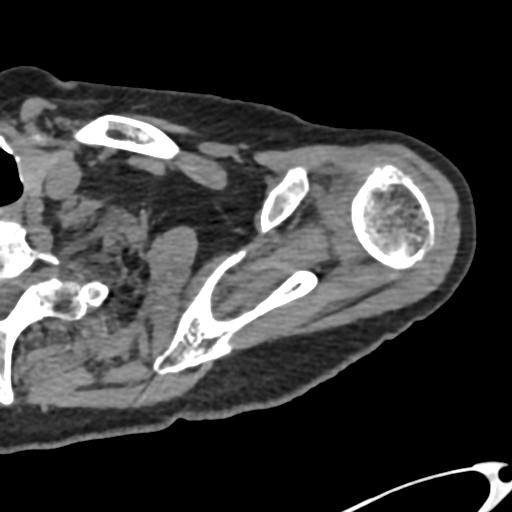
[im 146/204  bone]
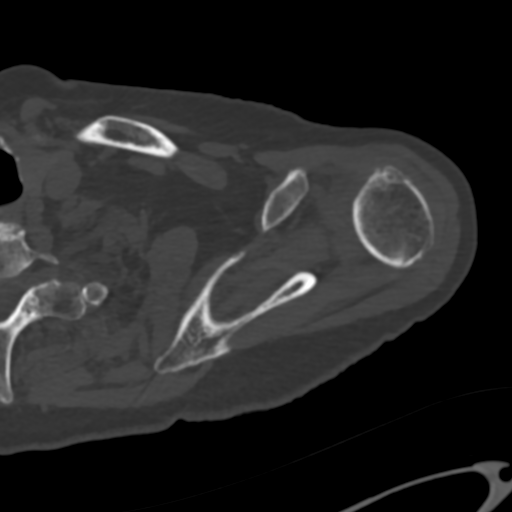
[im 175/204  bone]
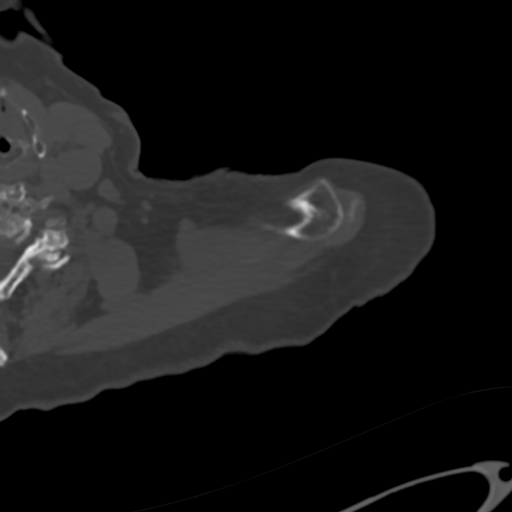

[6 of 14 positions shown; findings below may reference images not displayed]

FINDINGS: Bones/Joint/Cartilage

The patient has severe glenohumeral osteoarthritis. There is
bone-on-bone joint space narrowing, a large osteophyte off the
humeral head and a few small subchondral cysts in the glenoid. A
loose body in the axillary recess measures approximately 1.5 cm in
diameter. The acromioclavicular joint appears normal. The acromion
is type 1. No worrisome bony lesion.

Ligaments

Suboptimally assessed by CT.

Muscles and Tendons

Musculature of the shoulder girdle is preserved. Is visualized by CT
scan, the rotator cuff and biceps tendon appear intact.

Soft tissues

Imaged lung parenchyma demonstrates extensive emphysema.
Atherosclerosis noted.
IMPRESSION: Severe glenohumeral osteoarthritis.  No acute finding.

Aortic Atherosclerosis (OGBFY-SZS.S) and Emphysema (OGBFY-CRW.X).

## 2021-10-15 DIAGNOSIS — Z1211 Encounter for screening for malignant neoplasm of colon: Secondary | ICD-10-CM | POA: Diagnosis not present

## 2021-10-24 DIAGNOSIS — H353114 Nonexudative age-related macular degeneration, right eye, advanced atrophic with subfoveal involvement: Secondary | ICD-10-CM | POA: Diagnosis not present

## 2021-11-03 ENCOUNTER — Encounter: Payer: Self-pay | Admitting: Internal Medicine

## 2021-11-03 ENCOUNTER — Ambulatory Visit (INDEPENDENT_AMBULATORY_CARE_PROVIDER_SITE_OTHER): Payer: PPO | Admitting: Internal Medicine

## 2021-11-03 VITALS — BP 126/76 | HR 86 | Temp 98.3°F | Resp 16 | Ht 62.0 in | Wt 133.0 lb

## 2021-11-03 DIAGNOSIS — J9611 Chronic respiratory failure with hypoxia: Secondary | ICD-10-CM | POA: Diagnosis not present

## 2021-11-03 DIAGNOSIS — J441 Chronic obstructive pulmonary disease with (acute) exacerbation: Secondary | ICD-10-CM

## 2021-11-03 DIAGNOSIS — Z9981 Dependence on supplemental oxygen: Secondary | ICD-10-CM

## 2021-11-03 NOTE — Progress Notes (Signed)
[facility] [place], [place] [address]  Pulmonary Sleep Medicine   Office Visit Note  Patient Name: Joyce Rodriguez DOB: 1946/01/25 MRN [id number]  Date of Service: 11/03/2021  Complaints/HPI: She is doing well wit her breathing. She has had some macular degeneration and is seeing optho for this. Patient is using her oxygen as prescribed 2lpm usually. Noted some chest pain scheduled for an echo by her PCP. She has not had any admissions to the hospital. NO fevers or chills noted  ROS  General: (-) fever, (-) chills, (-) night sweats, (-) weakness Skin: (-) rashes, (-) itching,. Eyes: (-) visual changes, (-) redness, (-) itching. Nose and Sinuses: (-) nasal stuffiness or itchiness, (-) postnasal drip, (-) nosebleeds, (-) sinus trouble. Mouth and Throat: (-) sore throat, (-) hoarseness. Neck: (-) swollen glands, (-) enlarged thyroid, (-) neck pain. Respiratory: + cough, (-) bloody sputum, + shortness of breath, - wheezing. Cardiovascular: - ankle swelling, (-) chest pain. Lymphatic: (-) lymph node enlargement. Neurologic: (-) numbness, (-) tingling. Psychiatric: (-) anxiety, (-) depression   Current Medication: Outpatient Encounter Medications as of 11/03/2021  Medication Sig   acetaminophen (TYLENOL) 500 MG tablet Take 1,000 mg by mouth every 6 (six) hours as needed for moderate pain.   albuterol (VENTOLIN HFA) 108 (90 Base) MCG/ACT inhaler Inhale 2 puffs into the lungs every 6 (six) hours as needed. (Patient taking differently: Inhale 2 puffs into the lungs every 6 (six) hours as needed for wheezing or shortness of breath.)   aspirin EC 325 MG tablet Take 1 tablet (325 mg total) by mouth daily.   cholecalciferol (VITAMIN D) 1000 units tablet Take 1,000 Units by mouth daily.   Docusate Calcium (STOOL SOFTENER PO) Take 2 tablets by mouth daily.   escitalopram (LEXAPRO) 10 MG tablet Take 1 tablet (10 mg total) by mouth daily. (Patient taking differently: Take  10 mg by mouth every morning.)   ezetimibe (ZETIA) 10 MG tablet Take 10 mg by mouth every morning.   fluticasone-salmeterol (ADVAIR) 250-50 MCG/ACT AEPB Inhale 1 puff into the lungs in the morning and at bedtime.   Multiple Vitamin (MULTIVITAMIN WITH MINERALS) TABS tablet Take 1 tablet by mouth daily.   omeprazole (PRILOSEC) 20 MG capsule Take 1 capsule (20 mg total) by mouth daily as needed (heartburn).   OXYGEN Inhale 2.5-3 L into the lungs at bedtime. 3 L portable   uses [employer] for oxygen   potassium chloride (K-DUR) 10 MEQ tablet Take 1 tablet (10 mEq total) by mouth daily.   predniSONE (STERAPRED UNI-PAK 48 TAB) 10 MG (48) TBPK tablet Use as directed for 12 days   propranolol ER (INDERAL LA) 60 MG 24 hr capsule Take 60 mg by mouth daily.   sulfamethoxazole-trimethoprim (BACTRIM DS) 800-160 MG tablet Take 1 tablet by mouth 2 (two) times daily.   tiotropium (SPIRIVA HANDIHALER) 18 MCG inhalation capsule Place 1 capsule (18 mcg total) into inhaler and inhale every morning.   traMADol (ULTRAM) 50 MG tablet Take 1-2 tablets (50-100 mg total) by mouth every 6 (six) hours as needed for moderate pain.   zinc gluconate 50 MG tablet Take 50 mg by mouth daily.   No facility-administered encounter medications on file as of 11/03/2021.    Surgical History: Past Surgical History:  Procedure Laterality Date   ABDOMINAL HYSTERECTOMY     CHOLECYSTECTOMY     ESOPHAGEAL MANOMETRY N/A 09/13/2017   Procedure: ESOPHAGEAL MANOMETRY (EM);  Surgeon: Mauri Pole, MD;  Location: [facility];  Service: Endoscopy;  Laterality: N/A;   ESOPHAGOGASTRODUODENOSCOPY (EGD) WITH PROPOFOL N/A 05/11/2017   Procedure: ESOPHAGOGASTRODUODENOSCOPY (EGD) WITH PROPOFOL;  Surgeon: Lin Landsman, MD;  Location: [facility] ENDOSCOPY;  Service: Gastroenterology;  Laterality: N/A;   EYE SURGERY Bilateral    CATARACTS   FOOT SURGERY     MORTONS NEUROMA   LAPAROSCOPIC NISSEN FUNDOPLICATION N/A 08/16/2128    Procedure: LAPAROSCOPIC NISSEN FUNDOPLICATION;  Surgeon: Jules Husbands, MD;  Location: [facility] ORS;  Service: General;  Laterality: N/A;   NISSEN FUNDOPLICATION N/A 11/17/5782   Procedure: NISSEN FUNDOPLICATION;  Surgeon: Jules Husbands, MD;  Location: [facility] ORS;  Service: General;  Laterality: N/A;   REVERSE SHOULDER ARTHROPLASTY Left 09/10/2020   Procedure: REVERSE SHOULDER ARTHROPLASTY;  Surgeon: Corky Mull, MD;  Location: [facility] ORS;  Service: Orthopedics;  Laterality: Left;    Medical History: Past Medical History:  Diagnosis Date   Aortic atherosclerosis ([place])    Arthritis    Complication of anesthesia    difficulty waking up   COPD (chronic obstructive pulmonary disease) (HCC)    Coronary artery disease    Dyspnea    Emphysema of lung (HCC)    SEVERE   GERD (gastroesophageal reflux disease)    Headache    H/O   History of hiatal hernia    Hyperlipidemia    Hypertension    H/O OFF MEDS SINCE 2020 BY PCP   Osteoporosis    Oxygen deficiency    uses O2 at night   Oxygen dependent    Pneumonia    Umbilical hernia     Family History: Family History  Problem Relation Age of Onset   Ovarian cancer Mother    Emphysema Father    Melanoma Sister    Lung cancer Brother     Social History: Social History   Socioeconomic History   Marital status: Married    Spouse name: Not on file   Number of children: Not on file   Years of education: 12   Highest education level: 12th grade  Occupational History   Occupation: retired  Tobacco Use   Smoking status: Former    Packs/day: 1.50    Years: 30.00    Total pack years: 45.00    Types: Cigarettes    Quit date: 03/18/2001    Years since quitting: 20.6   Smokeless tobacco: Never  Vaping Use   Vaping Use: Never used  Substance and Sexual Activity   Alcohol use: Yes    Comment: rare   Drug use: No   Sexual activity: Not Currently  Other Topics Concern   Not on file  Social History Narrative   Not on file   Social  Determinants of Health   Financial Resource Strain: Low Risk  (04/19/2017)   Overall Financial Resource Strain (CARDIA)    Difficulty of Paying Living Expenses: Not hard at all  Food Insecurity: No Food Insecurity (04/19/2017)   Hunger Vital Sign    Worried About Running Out of Food in the Last Year: Never true    Ran Out of Food in the Last Year: Never true  Transportation Needs: No Transportation Needs (04/19/2017)   PRAPARE - Hydrologist (Medical): No    Lack of Transportation (Non-Medical): No  Physical Activity: Inactive (04/19/2017)   Exercise Vital Sign    Days of Exercise per Week: 0 days    Minutes of Exercise per Session: 0 min  Stress: No Stress Concern Present (04/19/2017)  [employer] - Occupational Stress Questionnaire    Feeling of Stress : Not at all  Social Connections: Moderately Integrated (04/19/2017)   Social Connection and Isolation Panel [NHANES]    Frequency of Communication with Friends and Family: More than three times a week    Frequency of Social Gatherings with Friends and Family: More than three times a week    Attends Religious Services: More than 4 times per year    Active Member of [employer] or Organizations: No    Attends Archivist Meetings: Never    Marital Status: Married  Human resources officer Violence: Not At Risk (04/19/2017)   Humiliation, Afraid, Rape, and Kick questionnaire    Fear of Current or Ex-Partner: No    Emotionally Abused: No    Physically Abused: No    Sexually Abused: No    Vital Signs: Blood pressure 126/76, pulse 86, temperature 98.3 F (36.8 C), resp. rate 16, height '5\' 2"'$  (1.575 m), weight 133 lb (60.3 kg), SpO2 93 %.  Examination: General Appearance: The patient is well-developed, well-nourished, and in no distress. Skin: Gross inspection of skin unremarkable. Head: normocephalic, no gross deformities. Eyes: no gross deformities noted. ENT: ears appear grossly normal  no exudates. Neck: Supple. No thyromegaly. No LAD. Respiratory: no rhonchi noted distant. Cardiovascular: Normal S1 and S2 without murmur or rub. Extremities: No cyanosis. pulses are equal. Neurologic: Alert and oriented. No involuntary movements.  LABS: No results found for this or any previous visit (from the past 2160 hour(s)).  Radiology: CT Chest High Resolution  Result Date: 12/11/2020 CLINICAL DATA:  76 year old female with history of bronchiolectasis. EXAM: CT CHEST WITHOUT CONTRAST TECHNIQUE: Multidetector CT imaging of the chest was performed following the standard protocol without intravenous contrast. High resolution imaging of the lungs, as well as inspiratory and expiratory imaging, was performed. COMPARISON:  Chest x-ray 06/21/2020. FINDINGS: Cardiovascular: Heart size is normal. There is no significant pericardial fluid, thickening or pericardial calcification. There is aortic atherosclerosis, as well as atherosclerosis of the great vessels of the mediastinum and the coronary arteries, including calcified atherosclerotic plaque in the left main, left anterior descending, left circumflex and right coronary arteries. Mediastinum/Nodes: No pathologically enlarged mediastinal or hilar lymph nodes. Esophagus is unremarkable in appearance. No axillary lymphadenopathy. Lungs/Pleura: Chronic mass-like area of architectural distortion and volume loss in the medial segment of the right middle lobe, similar to numerous prior examinations dating back to 2011, most compatible with an area of chronic post infectious or inflammatory scarring. Similar smaller lesion is also noted in the posterior aspect of the lingula. No other suspicious appearing pulmonary nodules or masses are noted. Scattered areas of mild cylindrical bronchiectasis and mucoid impaction are noted in the lungs bilaterally. No generalized areas of septal thickening, subpleural reticulation or honeycombing are noted to suggest  interstitial lung disease. Diffuse bronchial wall thickening with severe centrilobular and paraseptal emphysema. No acute consolidative airspace disease. No pleural effusions. Upper Abdomen: Aortic atherosclerosis.  Status post cholecystectomy. Musculoskeletal: There are no aggressive appearing lytic or blastic lesions noted in the visualized portions of the skeleton. Status post left shoulder arthroplasty. IMPRESSION: 1. No findings to suggest interstitial lung disease. 2. Scattered areas of bronchiectasis and post infectious or inflammatory scarring, similar to prior studies, as above. 3. Diffuse bronchial wall thickening with severe centrilobular and paraseptal emphysema; imaging findings compatible with reported clinical history of COPD. 4. Aortic atherosclerosis, in addition to left main and 3 vessel coronary artery disease. Assessment for potential  risk factor modification, dietary therapy or pharmacologic therapy may be warranted, if clinically indicated. Aortic Atherosclerosis (ICD10-I70.0) and Emphysema (ICD10-J43.9). Electronically Signed   By: Vinnie Langton M.D.   On: 12/11/2020 14:52    No results found.  No results found.    Assessment and Plan: Patient Active Problem List   Diagnosis Date Noted   Status post reverse arthroplasty of shoulder, left 09/10/2020   Primary osteoarthritis of left shoulder 12/04/2019   Primary osteoarthritis of right shoulder 12/04/2019   Rotator cuff tendinitis, left 12/04/2019   Statin myopathy 00/34/9179   Umbilical hernia with obstruction, without gangrene 05/31/2019   Panlobular emphysema ([place]) 05/08/2019   Recurrent major depressive disorder, in partial remission ([place]) 05/08/2019   Obstructive chronic bronchitis with exacerbation ([place]) 01/30/2019   Oxygen dependent 01/30/2019   S/P Nissen fundoplication (with gastrostomy tube placement) ([place]) 10/19/2017   CAD (coronary artery disease), native coronary artery 10/18/2017   Hiatal hernia  10/15/2017   Abnormal EKG 10/15/2017   Dysphagia    Schatzki's ring    Gastric erosion    Advanced care planning/counseling discussion 04/21/2017   Acute on chronic respiratory failure with hypoxia ([place]) 05/08/2016   COPD exacerbation ([place]) 05/08/2016   Hyperglycemia 05/08/2016   Dyspnea 05/06/2016   Encounter for hepatitis C screening test for low risk patient 03/19/2016   Essential hypertension 03/19/2015   COPD (chronic obstructive pulmonary disease) ([place]) 03/19/2015   Hyperlipidemia 03/19/2015   Osteoarthritis of both hands 03/19/2015   Osteoporosis 03/19/2015   Acid reflux 03/19/2015   Major depression, chronic 09/12/2014    1. Chronic respiratory failure with hypoxia (HCC) On oxygen therapy. Patient seems to be tolerating it well. Patient will continue with 2lpm  2. COPD exacerbation ([place]) At baseline now. She has not had a flare up recently  3. Oxygen dependent On 2lpm   General Counseling: I have discussed the findings of the evaluation and examination with Dorian Pod.  I have also discussed any further diagnostic evaluation thatmay be needed or ordered today. Kyley verbalizes understanding of the findings of todays visit. We also reviewed her medications today and discussed drug interactions and side effects including but not limited excessive drowsiness and altered mental states. We also discussed that there is always a risk not just to her but also people around her. she has been encouraged to call the office with any questions or concerns that should arise related to todays visit.  No orders of the defined types were placed in this encounter.    Time spent: 56  I have personally obtained a history, examined the patient, evaluated laboratory and imaging results, formulated the assessment and plan and placed orders.    Allyne Gee, MD [facility] Pulmonary and Critical Care Sleep medicine

## 2021-11-03 NOTE — Patient Instructions (Signed)
Chronic Obstructive Pulmonary Disease  Chronic obstructive pulmonary disease (COPD) is a long-term (chronic) lung problem. When you have COPD, it is hard for air to get in and out of your lungs. Usually the condition gets worse over time, and your lungs will never return to normal. There are things you can do to keep yourself as healthy as possible. What are the causes? Smoking. This is the most common cause. Certain genes passed from parent to child (inherited). What increases the risk? Being exposed to secondhand smoke from cigarettes, pipes, or cigars. Being exposed to chemicals and other irritants, such as fumes and dust in the work environment. Having chronic lung conditions or infections. What are the signs or symptoms? Shortness of breath, especially during physical activity. A long-term cough with a large amount of thick mucus. Sometimes, the cough may not have any mucus (dry cough). Wheezing. Breathing quickly. Skin that looks gray or blue, especially in the fingers, toes, or lips. Feeling tired (fatigue). Weight loss. Chest tightness. Having infections often. Episodes when breathing symptoms become much worse (exacerbations). At the later stages of this disease, you may have swelling in the ankles, feet, or legs. How is this treated? Taking medicines. Quitting smoking, if you smoke. Rehabilitation. This includes steps to make your body work better. It may involve a team of specialists. Doing exercises. Making changes to your diet. Using oxygen. Lung surgery. Lung transplant. Comfort measures (palliative care). Follow these instructions at home: Medicines Take over-the-counter and prescription medicines only as told by your doctor. Talk to your doctor before taking any cough or allergy medicines. You may need to avoid medicines that cause your lungs to be dry. Lifestyle If you smoke, stop smoking. Smoking makes the problem worse. Do not smoke or use any products that  contain nicotine or tobacco. If you need help quitting, ask your doctor. Avoid being around things that make your breathing worse. This may include smoke, chemicals, and fumes. Stay active, but remember to rest as well. Learn and use tips on how to manage stress and control your breathing. Make sure you get enough sleep. Most adults need at least 7 hours of sleep every night. Eat healthy foods. Eat smaller meals more often. Rest before meals. Controlled breathing Learn and use tips on how to control your breathing as told by your doctor. Try: Breathing in (inhaling) through your nose for 1 second. Then, pucker your lips and breath out (exhale) through your lips for 2 seconds. Putting one hand on your belly (abdomen). Breathe in slowly through your nose for 1 second. Your hand on your belly should move out. Pucker your lips and breathe out slowly through your lips. Your hand on your belly should move in as you breathe out.  Controlled coughing Learn and use controlled coughing to clear mucus from your lungs. Follow these steps: Lean your head a little forward. Breathe in deeply. Try to hold your breath for 3 seconds. Keep your mouth slightly open while coughing 2 times. Spit any mucus out into a tissue. Rest and do the steps again 1 or 2 times as needed. General instructions Make sure you get all the shots (vaccines) that your doctor recommends. Ask your doctor about a flu shot and a pneumonia shot. Use oxygen therapy and pulmonary rehabilitation if told by your doctor. If you need home oxygen therapy, ask your doctor if you should buy a tool to measure your oxygen level (oximeter). Make a COPD action plan with your doctor. This helps you   to know what to do if you feel worse than usual. Manage any other conditions you have as told by your doctor. Avoid going outside when it is very hot, cold, or humid. Avoid people who have a sickness you can catch (contagious). Keep all follow-up  visits. Contact a doctor if: You cough up more mucus than usual. There is a change in the color or thickness of the mucus. It is harder to breathe than usual. Your breathing is faster than usual. You have trouble sleeping. You need to use your medicines more often than usual. You have trouble doing your normal activities such as getting dressed or walking around the house. Get help right away if: You have shortness of breath while resting. You have shortness of breath that stops you from: Being able to talk. Doing normal activities. Your chest hurts for longer than 5 minutes. Your skin color is more blue than usual. Your pulse oximeter shows that you have low oxygen for longer than 5 minutes. You have a fever. You feel too tired to breathe normally. These symptoms may represent a serious problem that is an emergency. Do not wait to see if the symptoms will go away. Get medical help right away. Call your local emergency services (911 in the [place] Do not drive yourself to the hospital. Summary Chronic obstructive pulmonary disease (COPD) is a long-term lung problem. The way your lungs work will never return to normal. Usually the condition gets worse over time. There are things you can do to keep yourself as healthy as possible. Take over-the-counter and prescription medicines only as told by your doctor. If you smoke, stop. Smoking makes the problem worse. This information is not intended to replace advice given to you by your health care provider. Make sure you discuss any questions you have with your health care provider. Document Revised: 02/06/2020 Document Reviewed: 02/06/2020 Elsevier Patient Education  2023 [employer] Inc[employer]  

## 2021-11-11 DIAGNOSIS — R079 Chest pain, unspecified: Secondary | ICD-10-CM | POA: Diagnosis not present

## 2021-11-11 DIAGNOSIS — R0602 Shortness of breath: Secondary | ICD-10-CM | POA: Diagnosis not present

## 2021-11-12 ENCOUNTER — Other Ambulatory Visit: Payer: Self-pay | Admitting: Internal Medicine

## 2021-12-01 DIAGNOSIS — H353114 Nonexudative age-related macular degeneration, right eye, advanced atrophic with subfoveal involvement: Secondary | ICD-10-CM | POA: Diagnosis not present

## 2021-12-01 DIAGNOSIS — H353124 Nonexudative age-related macular degeneration, left eye, advanced atrophic with subfoveal involvement: Secondary | ICD-10-CM | POA: Diagnosis not present

## 2021-12-01 DIAGNOSIS — H43813 Vitreous degeneration, bilateral: Secondary | ICD-10-CM | POA: Diagnosis not present

## 2021-12-01 DIAGNOSIS — Z961 Presence of intraocular lens: Secondary | ICD-10-CM | POA: Diagnosis not present

## 2021-12-11 DIAGNOSIS — J449 Chronic obstructive pulmonary disease, unspecified: Secondary | ICD-10-CM | POA: Diagnosis not present

## 2021-12-26 ENCOUNTER — Telehealth: Payer: Self-pay

## 2021-12-26 ENCOUNTER — Other Ambulatory Visit: Payer: Self-pay

## 2021-12-26 MED ORDER — PREDNISONE 10 MG (21) PO TBPK: ORAL_TABLET | ORAL | 0 refills | Status: DC

## 2021-12-26 MED ORDER — AZITHROMYCIN 250 MG PO TABS: ORAL_TABLET | ORAL | 0 refills | Status: DC

## 2021-12-26 NOTE — Telephone Encounter (Signed)
Pt called that she is having sinus infection,coughing and SOB  and covid test is negative as per dr Humphrey Rolls send zpak and prednisone and use nebulizer as needed  to [facility] also advised her if symptoms getting worse to ED

## 2022-01-07 DIAGNOSIS — J3489 Other specified disorders of nose and nasal sinuses: Secondary | ICD-10-CM | POA: Diagnosis not present

## 2022-01-08 DIAGNOSIS — H903 Sensorineural hearing loss, bilateral: Secondary | ICD-10-CM | POA: Diagnosis not present

## 2022-01-08 DIAGNOSIS — J300 Vasomotor rhinitis: Secondary | ICD-10-CM | POA: Diagnosis not present

## 2022-01-08 DIAGNOSIS — J340 Abscess, furuncle and carbuncle of nose: Secondary | ICD-10-CM | POA: Diagnosis not present

## 2022-01-08 DIAGNOSIS — B379 Candidiasis, unspecified: Secondary | ICD-10-CM | POA: Diagnosis not present

## 2022-01-15 ENCOUNTER — Ambulatory Visit (INDEPENDENT_AMBULATORY_CARE_PROVIDER_SITE_OTHER): Payer: PPO | Admitting: Internal Medicine

## 2022-01-15 ENCOUNTER — Encounter: Payer: Self-pay | Admitting: Internal Medicine

## 2022-01-15 VITALS — BP 154/88 | HR 96 | Temp 98.3°F | Resp 16 | Ht 62.0 in | Wt 132.2 lb

## 2022-01-15 DIAGNOSIS — J479 Bronchiectasis, uncomplicated: Secondary | ICD-10-CM

## 2022-01-15 DIAGNOSIS — J9611 Chronic respiratory failure with hypoxia: Secondary | ICD-10-CM

## 2022-01-15 DIAGNOSIS — J441 Chronic obstructive pulmonary disease with (acute) exacerbation: Secondary | ICD-10-CM | POA: Diagnosis not present

## 2022-01-15 MED ORDER — SULFAMETHOXAZOLE-TRIMETHOPRIM 800-160 MG PO TABS: 1.0000 | ORAL_TABLET | Freq: Two times a day (BID) | ORAL | 1 refills | Status: DC

## 2022-01-15 MED ORDER — PREDNISONE 10 MG (21) PO TBPK: ORAL_TABLET | ORAL | 1 refills | Status: DC

## 2022-01-15 NOTE — Patient Instructions (Signed)
Chronic Obstructive Pulmonary Disease  Chronic obstructive pulmonary disease (COPD) is a long-term (chronic) lung problem. When you have COPD, it is hard for air to get in and out of your lungs. Usually the condition gets worse over time, and your lungs will never return to normal. There are things you can do to keep yourself as healthy as possible. What are the causes? Smoking. This is the most common cause. Certain genes passed from parent to child (inherited). What increases the risk? Being exposed to secondhand smoke from cigarettes, pipes, or cigars. Being exposed to chemicals and other irritants, such as fumes and dust in the work environment. Having chronic lung conditions or infections. What are the signs or symptoms? Shortness of breath, especially during physical activity. A long-term cough with a large amount of thick mucus. Sometimes, the cough may not have any mucus (dry cough). Wheezing. Breathing quickly. Skin that looks gray or blue, especially in the fingers, toes, or lips. Feeling tired (fatigue). Weight loss. Chest tightness. Having infections often. Episodes when breathing symptoms become much worse (exacerbations). At the later stages of this disease, you may have swelling in the ankles, feet, or legs. How is this treated? Taking medicines. Quitting smoking, if you smoke. Rehabilitation. This includes steps to make your body work better. It may involve a team of specialists. Doing exercises. Making changes to your diet. Using oxygen. Lung surgery. Lung transplant. Comfort measures (palliative care). Follow these instructions at home: Medicines Take over-the-counter and prescription medicines only as told by your doctor. Talk to your doctor before taking any cough or allergy medicines. You may need to avoid medicines that cause your lungs to be dry. Lifestyle If you smoke, stop smoking. Smoking makes the problem worse. Do not smoke or use any products that  contain nicotine or tobacco. If you need help quitting, ask your doctor. Avoid being around things that make your breathing worse. This may include smoke, chemicals, and fumes. Stay active, but remember to rest as well. Learn and use tips on how to manage stress and control your breathing. Make sure you get enough sleep. Most adults need at least 7 hours of sleep every night. Eat healthy foods. Eat smaller meals more often. Rest before meals. Controlled breathing Learn and use tips on how to control your breathing as told by your doctor. Try: Breathing in (inhaling) through your nose for 1 second. Then, pucker your lips and breath out (exhale) through your lips for 2 seconds. Putting one hand on your belly (abdomen). Breathe in slowly through your nose for 1 second. Your hand on your belly should move out. Pucker your lips and breathe out slowly through your lips. Your hand on your belly should move in as you breathe out.  Controlled coughing Learn and use controlled coughing to clear mucus from your lungs. Follow these steps: Lean your head a little forward. Breathe in deeply. Try to hold your breath for 3 seconds. Keep your mouth slightly open while coughing 2 times. Spit any mucus out into a tissue. Rest and do the steps again 1 or 2 times as needed. General instructions Make sure you get all the shots (vaccines) that your doctor recommends. Ask your doctor about a flu shot and a pneumonia shot. Use oxygen therapy and pulmonary rehabilitation if told by your doctor. If you need home oxygen therapy, ask your doctor if you should buy a tool to measure your oxygen level (oximeter). Make a COPD action plan with your doctor. This helps you   to know what to do if you feel worse than usual. Manage any other conditions you have as told by your doctor. Avoid going outside when it is very hot, cold, or humid. Avoid people who have a sickness you can catch (contagious). Keep all follow-up  visits. Contact a doctor if: You cough up more mucus than usual. There is a change in the color or thickness of the mucus. It is harder to breathe than usual. Your breathing is faster than usual. You have trouble sleeping. You need to use your medicines more often than usual. You have trouble doing your normal activities such as getting dressed or walking around the house. Get help right away if: You have shortness of breath while resting. You have shortness of breath that stops you from: Being able to talk. Doing normal activities. Your chest hurts for longer than 5 minutes. Your skin color is more blue than usual. Your pulse oximeter shows that you have low oxygen for longer than 5 minutes. You have a fever. You feel too tired to breathe normally. These symptoms may represent a serious problem that is an emergency. Do not wait to see if the symptoms will go away. Get medical help right away. Call your local emergency services (911 in the [place] Do not drive yourself to the hospital. Summary Chronic obstructive pulmonary disease (COPD) is a long-term lung problem. The way your lungs work will never return to normal. Usually the condition gets worse over time. There are things you can do to keep yourself as healthy as possible. Take over-the-counter and prescription medicines only as told by your doctor. If you smoke, stop. Smoking makes the problem worse. This information is not intended to replace advice given to you by your health care provider. Make sure you discuss any questions you have with your health care provider. Document Revised: 02/06/2020 Document Reviewed: 02/06/2020 Elsevier Patient Education  2023 [employer] Inc[employer]  

## 2022-01-15 NOTE — Progress Notes (Signed)
[facility] [place], [place] [address]  Pulmonary Sleep Medicine   Office Visit Note  Patient Name: Joyce Rodriguez DOB: April 02, 1946 MRN [id number]  Date of Service: 01/15/2022  Complaints/HPI: She has not been feeling well for a month. She states she was seen by ENT and was given prescription for a nasal spray. This caused her to develop more SOB. She had also been on a zpk after calling our office on 9/15 She feels that her chest is still congested.She has completed zpk amd also the steroids. She states she is not able to sleep due to the SOB. She is bringing up clear sputum. She has noted oxygen is not helping as much currently on 3lpm since she has been sick  ROS  General: (-) fever, (-) chills, (-) night sweats, (-) weakness Skin: (-) rashes, (-) itching,. Eyes: (-) visual changes, (-) redness, (-) itching. Nose and Sinuses: (-) nasal stuffiness or itchiness, (-) postnasal drip, (-) nosebleeds, (-) sinus trouble. Mouth and Throat: (-) sore throat, (-) hoarseness. Neck: (-) swollen glands, (-) enlarged thyroid, (-) neck pain. Respiratory: + cough, (-) bloody sputum, + shortness of breath, + wheezing. Cardiovascular: - ankle swelling, (-) chest pain. Lymphatic: (-) lymph node enlargement. Neurologic: (-) numbness, (-) tingling. Psychiatric: (-) anxiety, (-) depression   Current Medication: Outpatient Encounter Medications as of 01/15/2022  Medication Sig   acetaminophen (TYLENOL) 500 MG tablet Take 1,000 mg by mouth every 6 (six) hours as needed for moderate pain.   albuterol (VENTOLIN HFA) 108 (90 Base) MCG/ACT inhaler Inhale 2 puffs into the lungs every 6 (six) hours as needed. (Patient taking differently: Inhale 2 puffs into the lungs every 6 (six) hours as needed for wheezing or shortness of breath.)   aspirin EC 325 MG tablet Take 1 tablet (325 mg total) by mouth daily.   cholecalciferol (VITAMIN D) 1000 units tablet Take 1,000 Units by mouth daily.    Docusate Calcium (STOOL SOFTENER PO) Take 2 tablets by mouth daily.   escitalopram (LEXAPRO) 10 MG tablet Take 1 tablet (10 mg total) by mouth daily. (Patient taking differently: Take 10 mg by mouth every morning.)   ezetimibe (ZETIA) 10 MG tablet Take 10 mg by mouth every morning.   fluticasone-salmeterol (ADVAIR) 250-50 MCG/ACT AEPB Inhale 1 puff into the lungs in the morning and at bedtime.   Multiple Vitamin (MULTIVITAMIN WITH MINERALS) TABS tablet Take 1 tablet by mouth daily.   omeprazole (PRILOSEC) 20 MG capsule TAKE ONE CAPSULE BY MOUTH DAILY AS NEEDED FOR HEARTBURN   OXYGEN Inhale 2.5-3 L into the lungs at bedtime. 3 L portable   uses [employer] for oxygen   potassium chloride (K-DUR) 10 MEQ tablet Take 1 tablet (10 mEq total) by mouth daily.   propranolol ER (INDERAL LA) 60 MG 24 hr capsule Take 60 mg by mouth daily.   tiotropium (SPIRIVA HANDIHALER) 18 MCG inhalation capsule Place 1 capsule (18 mcg total) into inhaler and inhale every morning.   traMADol (ULTRAM) 50 MG tablet Take 1-2 tablets (50-100 mg total) by mouth every 6 (six) hours as needed for moderate pain.   zinc gluconate 50 MG tablet Take 50 mg by mouth daily.   [DISCONTINUED] azithromycin (ZITHROMAX) 250 MG tablet Take 2 tab po first day then take 1 tab po daily for 4 days (Patient not taking: Reported on 01/15/2022)   [DISCONTINUED] predniSONE (STERAPRED UNI-PAK 21 TAB) 10 MG (21) TBPK tablet Take as directed 6 days taper (Patient not taking:  Reported on 01/15/2022)   [DISCONTINUED] sulfamethoxazole-trimethoprim (BACTRIM DS) 800-160 MG tablet Take 1 tablet by mouth 2 (two) times daily. (Patient not taking: Reported on 01/15/2022)   No facility-administered encounter medications on file as of 01/15/2022.    Surgical History: Past Surgical History:  Procedure Laterality Date   ABDOMINAL HYSTERECTOMY     CHOLECYSTECTOMY     ESOPHAGEAL MANOMETRY N/A 09/13/2017   Procedure: ESOPHAGEAL MANOMETRY (EM);  Surgeon:  Mauri Pole, MD;  Location: [facility] ENDOSCOPY;  Service: Endoscopy;  Laterality: N/A;   ESOPHAGOGASTRODUODENOSCOPY (EGD) WITH PROPOFOL N/A 05/11/2017   Procedure: ESOPHAGOGASTRODUODENOSCOPY (EGD) WITH PROPOFOL;  Surgeon: Lin Landsman, MD;  Location: [facility] ENDOSCOPY;  Service: Gastroenterology;  Laterality: N/A;   EYE SURGERY Bilateral    CATARACTS   FOOT SURGERY     MORTONS NEUROMA   LAPAROSCOPIC NISSEN FUNDOPLICATION N/A 06/15/3612   Procedure: LAPAROSCOPIC NISSEN FUNDOPLICATION;  Surgeon: Jules Husbands, MD;  Location: [facility] ORS;  Service: General;  Laterality: N/A;   NISSEN FUNDOPLICATION N/A 07/15/1538   Procedure: NISSEN FUNDOPLICATION;  Surgeon: Jules Husbands, MD;  Location: [facility] ORS;  Service: General;  Laterality: N/A;   REVERSE SHOULDER ARTHROPLASTY Left 09/10/2020   Procedure: REVERSE SHOULDER ARTHROPLASTY;  Surgeon: Corky Mull, MD;  Location: [facility] ORS;  Service: Orthopedics;  Laterality: Left;    Medical History: Past Medical History:  Diagnosis Date   Aortic atherosclerosis ([place])    Arthritis    Complication of anesthesia    difficulty waking up   COPD (chronic obstructive pulmonary disease) (HCC)    Coronary artery disease    Dyspnea    Emphysema of lung (HCC)    SEVERE   GERD (gastroesophageal reflux disease)    Headache    H/O   History of hiatal hernia    Hyperlipidemia    Hypertension    H/O OFF MEDS SINCE 2020 BY PCP   Osteoporosis    Oxygen deficiency    uses O2 at night   Oxygen dependent    Pneumonia    Umbilical hernia     Family History: Family History  Problem Relation Age of Onset   Ovarian cancer Mother    Emphysema Father    Melanoma Sister    Lung cancer Brother     Social History: Social History   Socioeconomic History   Marital status: Married    Spouse name: Not on file   Number of children: Not on file   Years of education: 12   Highest education level: 12th grade  Occupational History   Occupation: retired  Tobacco  Use   Smoking status: Former    Packs/day: 1.50    Years: 30.00    Total pack years: 45.00    Types: Cigarettes    Quit date: 03/18/2001    Years since quitting: 20.8   Smokeless tobacco: Never  Vaping Use   Vaping Use: Never used  Substance and Sexual Activity   Alcohol use: Yes    Comment: rare   Drug use: No   Sexual activity: Not Currently  Other Topics Concern   Not on file  Social History Narrative   Not on file   Social Determinants of Health   Financial Resource Strain: Low Risk  (04/19/2017)   Overall Financial Resource Strain (CARDIA)    Difficulty of Paying Living Expenses: Not hard at all  Food Insecurity: No Food Insecurity (04/19/2017)   Hunger Vital Sign    Worried About Running Out of Food in the Last  Year: Never true    [place] in the Last Year: Never true  Transportation Needs: No Transportation Needs (04/19/2017)   PRAPARE - Hydrologist (Medical): No    Lack of Transportation (Non-Medical): No  Physical Activity: Inactive (04/19/2017)   Exercise Vital Sign    Days of Exercise per Week: 0 days    Minutes of Exercise per Session: 0 min  Stress: No Stress Concern Present (04/19/2017)   [place]    Feeling of Stress : Not at all  Social Connections: Moderately Integrated (04/19/2017)   Social Connection and Isolation Panel [NHANES]    Frequency of Communication with Friends and Family: More than three times a week    Frequency of Social Gatherings with Friends and Family: More than three times a week    Attends Religious Services: More than 4 times per year    Active Member of [employer] or Organizations: No    Attends Archivist Meetings: Never    Marital Status: Married  Human resources officer Violence: Not At Risk (04/19/2017)   Humiliation, Afraid, Rape, and Kick questionnaire    Fear of Current or Ex-Partner: No    Emotionally Abused: No    Physically  Abused: No    Sexually Abused: No    Vital Signs: Blood pressure (!) 154/88, pulse 96, temperature 98.3 F (36.8 C), resp. rate 16, height '5\' 2"'$  (1.575 m), weight 132 lb 3.2 oz (60 kg), SpO2 94 %.  Examination: General Appearance: The patient is well-developed, well-nourished, and in no distress. Skin: Gross inspection of skin unremarkable. Head: normocephalic, no gross deformities. Eyes: no gross deformities noted. ENT: ears appear grossly normal no exudates. Neck: Supple. No thyromegaly. No LAD. Respiratory: no rhonchi noted. Cardiovascular: Normal S1 and S2 without murmur or rub. Extremities: No cyanosis. pulses are equal. Neurologic: Alert and oriented. No involuntary movements.  LABS: No results found for this or any previous visit (from the past 2160 hour(s)).  Radiology: CT Chest High Resolution  Result Date: 12/11/2020 CLINICAL DATA:  76 year old female with history of bronchiolectasis. EXAM: CT CHEST WITHOUT CONTRAST TECHNIQUE: Multidetector CT imaging of the chest was performed following the standard protocol without intravenous contrast. High resolution imaging of the lungs, as well as inspiratory and expiratory imaging, was performed. COMPARISON:  Chest x-ray 06/21/2020. FINDINGS: Cardiovascular: Heart size is normal. There is no significant pericardial fluid, thickening or pericardial calcification. There is aortic atherosclerosis, as well as atherosclerosis of the great vessels of the mediastinum and the coronary arteries, including calcified atherosclerotic plaque in the left main, left anterior descending, left circumflex and right coronary arteries. Mediastinum/Nodes: No pathologically enlarged mediastinal or hilar lymph nodes. Esophagus is unremarkable in appearance. No axillary lymphadenopathy. Lungs/Pleura: Chronic mass-like area of architectural distortion and volume loss in the medial segment of the right middle lobe, similar to numerous prior examinations dating back  to 2011, most compatible with an area of chronic post infectious or inflammatory scarring. Similar smaller lesion is also noted in the posterior aspect of the lingula. No other suspicious appearing pulmonary nodules or masses are noted. Scattered areas of mild cylindrical bronchiectasis and mucoid impaction are noted in the lungs bilaterally. No generalized areas of septal thickening, subpleural reticulation or honeycombing are noted to suggest interstitial lung disease. Diffuse bronchial wall thickening with severe centrilobular and paraseptal emphysema. No acute consolidative airspace disease. No pleural effusions. Upper Abdomen: Aortic atherosclerosis.  Status post cholecystectomy. Musculoskeletal:  There are no aggressive appearing lytic or blastic lesions noted in the visualized portions of the skeleton. Status post left shoulder arthroplasty. IMPRESSION: 1. No findings to suggest interstitial lung disease. 2. Scattered areas of bronchiectasis and post infectious or inflammatory scarring, similar to prior studies, as above. 3. Diffuse bronchial wall thickening with severe centrilobular and paraseptal emphysema; imaging findings compatible with reported clinical history of COPD. 4. Aortic atherosclerosis, in addition to left main and 3 vessel coronary artery disease. Assessment for potential risk factor modification, dietary therapy or pharmacologic therapy may be warranted, if clinically indicated. Aortic Atherosclerosis (ICD10-I70.0) and Emphysema (ICD10-J43.9). Electronically Signed   By: Vinnie Langton M.D.   On: 12/11/2020 14:52    No results found.  No results found.    Assessment and Plan: Patient Active Problem List   Diagnosis Date Noted   Status post reverse arthroplasty of shoulder, left 09/10/2020   Primary osteoarthritis of left shoulder 12/04/2019   Primary osteoarthritis of right shoulder 12/04/2019   Rotator cuff tendinitis, left 12/04/2019   Statin myopathy 13/11/6576    Umbilical hernia with obstruction, without gangrene 05/31/2019   Panlobular emphysema ([place]) 05/08/2019   Recurrent major depressive disorder, in partial remission ([place]) 05/08/2019   Obstructive chronic bronchitis with exacerbation ([place]) 01/30/2019   Oxygen dependent 01/30/2019   S/P Nissen fundoplication (with gastrostomy tube placement) ([place]) 10/19/2017   CAD (coronary artery disease), native coronary artery 10/18/2017   Hiatal hernia 10/15/2017   Abnormal EKG 10/15/2017   Dysphagia    Schatzki's ring    Gastric erosion    Advanced care planning/counseling discussion 04/21/2017   Acute on chronic respiratory failure with hypoxia ([place]) 05/08/2016   COPD exacerbation ([place]) 05/08/2016   Hyperglycemia 05/08/2016   Dyspnea 05/06/2016   Encounter for hepatitis C screening test for low risk patient 03/19/2016   Essential hypertension 03/19/2015   COPD (chronic obstructive pulmonary disease) ([place]) 03/19/2015   Hyperlipidemia 03/19/2015   Osteoarthritis of both hands 03/19/2015   Osteoporosis 03/19/2015   Acid reflux 03/19/2015   Major depression, chronic 09/12/2014    1. Chronic respiratory failure with hypoxia (HCC) Acute hypoxia secondary to acute exacerbation of COPD plan is going to be to give her a course of Bactrim along with steroid  2. COPD exacerbation (HCC)  - sulfamethoxazole-trimethoprim (BACTRIM DS) 800-160 MG tablet; Take 1 tablet by mouth 2 (two) times daily.  Dispense: 30 tablet; Refill: 1 - predniSONE (STERAPRED UNI-PAK 21 TAB) 10 MG (21) TBPK tablet; Take a dose pack as directed  Dispense: 1 each; Refill: 1  3. Bronchiectasis without complication (HCC) Acute flareup - sulfamethoxazole-trimethoprim (BACTRIM DS) 800-160 MG tablet; Take 1 tablet by mouth 2 (two) times daily.  Dispense: 30 tablet; Refill: 1   General Counseling: I have discussed the findings of the evaluation and examination with Dorian Pod.  I have also discussed any further diagnostic evaluation thatmay be  needed or ordered today. Melba verbalizes understanding of the findings of todays visit. We also reviewed her medications today and discussed drug interactions and side effects including but not limited excessive drowsiness and altered mental states. We also discussed that there is always a risk not just to her but also people around her. she has been encouraged to call the office with any questions or concerns that should arise related to todays visit.  No orders of the defined types were placed in this encounter.    Time spent: 43  I have personally obtained a history, examined the patient, evaluated laboratory and  imaging results, formulated the assessment and plan and placed orders.    Allyne Gee, MD [facility] Pulmonary and Critical Care Sleep medicine

## 2022-01-29 ENCOUNTER — Encounter: Payer: Self-pay | Admitting: Internal Medicine

## 2022-01-29 ENCOUNTER — Ambulatory Visit (INDEPENDENT_AMBULATORY_CARE_PROVIDER_SITE_OTHER): Payer: PPO | Admitting: Internal Medicine

## 2022-01-29 VITALS — BP 128/65 | HR 90 | Temp 98.4°F | Resp 16 | Ht 62.0 in | Wt 128.8 lb

## 2022-01-29 DIAGNOSIS — J9611 Chronic respiratory failure with hypoxia: Secondary | ICD-10-CM

## 2022-01-29 DIAGNOSIS — Z9981 Dependence on supplemental oxygen: Secondary | ICD-10-CM | POA: Diagnosis not present

## 2022-01-29 DIAGNOSIS — J479 Bronchiectasis, uncomplicated: Secondary | ICD-10-CM

## 2022-01-29 DIAGNOSIS — J441 Chronic obstructive pulmonary disease with (acute) exacerbation: Secondary | ICD-10-CM

## 2022-01-29 NOTE — Progress Notes (Signed)
[facility] [place], [place] [address]  Pulmonary Sleep Medicine   Office Visit Note  Patient Name: Joyce Rodriguez DOB: 03-30-1946 MRN [id number]  Date of Service: 01/29/2022  Complaints/HPI: Patient states she is doing better. She is now off the steroids. She is taking the bactrim as prescribed. Patient is on her oxygen. Deneis having chest pain. She states breathing back to baseline.  She states that she is still has shortness of breath of course when she exerts herself but feels that she is much better now since she received the antibiotics as well as the steroids.  Have instructed her to continue taking antibiotic to completion  ROS  General: (-) fever, (-) chills, (-) night sweats, (-) weakness Skin: (-) rashes, (-) itching,. Eyes: (-) visual changes, (-) redness, (-) itching. Nose and Sinuses: (-) nasal stuffiness or itchiness, (-) postnasal drip, (-) nosebleeds, (-) sinus trouble. Mouth and Throat: (-) sore throat, (-) hoarseness. Neck: (-) swollen glands, (-) enlarged thyroid, (-) neck pain. Respiratory: - cough, (-) bloody sputum, + shortness of breath, - wheezing. Cardiovascular: - ankle swelling, (-) chest pain. Lymphatic: (-) lymph node enlargement. Neurologic: (-) numbness, (-) tingling. Psychiatric: (-) anxiety, (-) depression   Current Medication: Outpatient Encounter Medications as of 01/29/2022  Medication Sig   acetaminophen (TYLENOL) 500 MG tablet Take 1,000 mg by mouth every 6 (six) hours as needed for moderate pain.   albuterol (VENTOLIN HFA) 108 (90 Base) MCG/ACT inhaler Inhale 2 puffs into the lungs every 6 (six) hours as needed. (Patient taking differently: Inhale 2 puffs into the lungs every 6 (six) hours as needed for wheezing or shortness of breath.)   aspirin EC 325 MG tablet Take 1 tablet (325 mg total) by mouth daily.   cholecalciferol (VITAMIN D) 1000 units tablet Take 1,000 Units by mouth daily.   Docusate Calcium (STOOL  SOFTENER PO) Take 2 tablets by mouth daily.   escitalopram (LEXAPRO) 10 MG tablet Take 1 tablet (10 mg total) by mouth daily. (Patient taking differently: Take 10 mg by mouth every morning.)   ezetimibe (ZETIA) 10 MG tablet Take 10 mg by mouth every morning.   fluticasone-salmeterol (ADVAIR) 250-50 MCG/ACT AEPB Inhale 1 puff into the lungs in the morning and at bedtime.   Multiple Vitamin (MULTIVITAMIN WITH MINERALS) TABS tablet Take 1 tablet by mouth daily.   omeprazole (PRILOSEC) 20 MG capsule TAKE ONE CAPSULE BY MOUTH DAILY AS NEEDED FOR HEARTBURN   OXYGEN Inhale 2.5-3 L into the lungs at bedtime. 3 L portable   uses [employer] for oxygen   potassium chloride (K-DUR) 10 MEQ tablet Take 1 tablet (10 mEq total) by mouth daily.   predniSONE (STERAPRED UNI-PAK 21 TAB) 10 MG (21) TBPK tablet Take a dose pack as directed   propranolol ER (INDERAL LA) 60 MG 24 hr capsule Take 60 mg by mouth daily.   sulfamethoxazole-trimethoprim (BACTRIM DS) 800-160 MG tablet Take 1 tablet by mouth 2 (two) times daily.   tiotropium (SPIRIVA HANDIHALER) 18 MCG inhalation capsule Place 1 capsule (18 mcg total) into inhaler and inhale every morning.   traMADol (ULTRAM) 50 MG tablet Take 1-2 tablets (50-100 mg total) by mouth every 6 (six) hours as needed for moderate pain.   zinc gluconate 50 MG tablet Take 50 mg by mouth daily.   No facility-administered encounter medications on file as of 01/29/2022.    Surgical History: Past Surgical History:  Procedure Laterality Date   ABDOMINAL HYSTERECTOMY  CHOLECYSTECTOMY     ESOPHAGEAL MANOMETRY N/A 09/13/2017   Procedure: ESOPHAGEAL MANOMETRY (EM);  Surgeon: Mauri Pole, MD;  Location: [facility] ENDOSCOPY;  Service: Endoscopy;  Laterality: N/A;   ESOPHAGOGASTRODUODENOSCOPY (EGD) WITH PROPOFOL N/A 05/11/2017   Procedure: ESOPHAGOGASTRODUODENOSCOPY (EGD) WITH PROPOFOL;  Surgeon: Lin Landsman, MD;  Location: [facility] ENDOSCOPY;  Service: Gastroenterology;   Laterality: N/A;   EYE SURGERY Bilateral    CATARACTS   FOOT SURGERY     MORTONS NEUROMA   LAPAROSCOPIC NISSEN FUNDOPLICATION N/A 12/14/2669   Procedure: LAPAROSCOPIC NISSEN FUNDOPLICATION;  Surgeon: Jules Husbands, MD;  Location: [facility] ORS;  Service: General;  Laterality: N/A;   NISSEN FUNDOPLICATION N/A 05/18/5807   Procedure: NISSEN FUNDOPLICATION;  Surgeon: Jules Husbands, MD;  Location: [facility] ORS;  Service: General;  Laterality: N/A;   REVERSE SHOULDER ARTHROPLASTY Left 09/10/2020   Procedure: REVERSE SHOULDER ARTHROPLASTY;  Surgeon: Corky Mull, MD;  Location: [facility] ORS;  Service: Orthopedics;  Laterality: Left;    Medical History: Past Medical History:  Diagnosis Date   Aortic atherosclerosis ([place])    Arthritis    Complication of anesthesia    difficulty waking up   COPD (chronic obstructive pulmonary disease) (HCC)    Coronary artery disease    Dyspnea    Emphysema of lung (HCC)    SEVERE   GERD (gastroesophageal reflux disease)    Headache    H/O   History of hiatal hernia    Hyperlipidemia    Hypertension    H/O OFF MEDS SINCE 2020 BY PCP   Osteoporosis    Oxygen deficiency    uses O2 at night   Oxygen dependent    Pneumonia    Umbilical hernia     Family History: Family History  Problem Relation Age of Onset   Ovarian cancer Mother    Emphysema Father    Melanoma Sister    Lung cancer Brother     Social History: Social History   Socioeconomic History   Marital status: Married    Spouse name: Not on file   Number of children: Not on file   Years of education: 12   Highest education level: 12th grade  Occupational History   Occupation: retired  Tobacco Use   Smoking status: Former    Packs/day: 1.50    Years: 30.00    Total pack years: 45.00    Types: Cigarettes    Quit date: 03/18/2001    Years since quitting: 20.8   Smokeless tobacco: Never  Vaping Use   Vaping Use: Never used  Substance and Sexual Activity   Alcohol use: Yes    Comment:  rare   Drug use: No   Sexual activity: Not Currently  Other Topics Concern   Not on file  Social History Narrative   Not on file   Social Determinants of Health   Financial Resource Strain: Low Risk  (04/19/2017)   Overall Financial Resource Strain (CARDIA)    Difficulty of Paying Living Expenses: Not hard at all  Food Insecurity: No Food Insecurity (04/19/2017)   Hunger Vital Sign    Worried About Running Out of Food in the Last Year: Never true    Ran Out of Food in the Last Year: Never true  Transportation Needs: No Transportation Needs (04/19/2017)   PRAPARE - Hydrologist (Medical): No    Lack of Transportation (Non-Medical): No  Physical Activity: Inactive (04/19/2017)   Exercise Vital Sign    Days  of Exercise per Week: 0 days    Minutes of Exercise per Session: 0 min  Stress: No Stress Concern Present (04/19/2017)   [place]    Feeling of Stress : Not at all  Social Connections: Moderately Integrated (04/19/2017)   Social Connection and Isolation Panel [NHANES]    Frequency of Communication with Friends and Family: More than three times a week    Frequency of Social Gatherings with Friends and Family: More than three times a week    Attends Religious Services: More than 4 times per year    Active Member of [employer] or Organizations: No    Attends Archivist Meetings: Never    Marital Status: Married  Human resources officer Violence: Not At Risk (04/19/2017)   Humiliation, Afraid, Rape, and Kick questionnaire    Fear of Current or Ex-Partner: No    Emotionally Abused: No    Physically Abused: No    Sexually Abused: No    Vital Signs: Blood pressure 128/65, pulse 90, temperature 98.4 F (36.9 C), resp. rate 16, height '5\' 2"'$  (1.575 m), weight 128 lb 12.8 oz (58.4 kg), SpO2 95 %.  Examination: General Appearance: The patient is well-developed, well-nourished, and in no  distress. Skin: Gross inspection of skin unremarkable. Head: normocephalic, no gross deformities. Eyes: no gross deformities noted. ENT: ears appear grossly normal no exudates. Neck: Supple. No thyromegaly. No LAD. Respiratory: no rhonchi. Cardiovascular: Normal S1 and S2 without murmur or rub. Extremities: No cyanosis. pulses are equal. Neurologic: Alert and oriented. No involuntary movements.  LABS: No results found for this or any previous visit (from the past 2160 hour(s)).  Radiology: CT Chest High Resolution  Result Date: 12/11/2020 CLINICAL DATA:  76 year old female with history of bronchiolectasis. EXAM: CT CHEST WITHOUT CONTRAST TECHNIQUE: Multidetector CT imaging of the chest was performed following the standard protocol without intravenous contrast. High resolution imaging of the lungs, as well as inspiratory and expiratory imaging, was performed. COMPARISON:  Chest x-ray 06/21/2020. FINDINGS: Cardiovascular: Heart size is normal. There is no significant pericardial fluid, thickening or pericardial calcification. There is aortic atherosclerosis, as well as atherosclerosis of the great vessels of the mediastinum and the coronary arteries, including calcified atherosclerotic plaque in the left main, left anterior descending, left circumflex and right coronary arteries. Mediastinum/Nodes: No pathologically enlarged mediastinal or hilar lymph nodes. Esophagus is unremarkable in appearance. No axillary lymphadenopathy. Lungs/Pleura: Chronic mass-like area of architectural distortion and volume loss in the medial segment of the right middle lobe, similar to numerous prior examinations dating back to 2011, most compatible with an area of chronic post infectious or inflammatory scarring. Similar smaller lesion is also noted in the posterior aspect of the lingula. No other suspicious appearing pulmonary nodules or masses are noted. Scattered areas of mild cylindrical bronchiectasis and mucoid  impaction are noted in the lungs bilaterally. No generalized areas of septal thickening, subpleural reticulation or honeycombing are noted to suggest interstitial lung disease. Diffuse bronchial wall thickening with severe centrilobular and paraseptal emphysema. No acute consolidative airspace disease. No pleural effusions. Upper Abdomen: Aortic atherosclerosis.  Status post cholecystectomy. Musculoskeletal: There are no aggressive appearing lytic or blastic lesions noted in the visualized portions of the skeleton. Status post left shoulder arthroplasty. IMPRESSION: 1. No findings to suggest interstitial lung disease. 2. Scattered areas of bronchiectasis and post infectious or inflammatory scarring, similar to prior studies, as above. 3. Diffuse bronchial wall thickening with severe centrilobular and paraseptal emphysema; imaging  findings compatible with reported clinical history of COPD. 4. Aortic atherosclerosis, in addition to left main and 3 vessel coronary artery disease. Assessment for potential risk factor modification, dietary therapy or pharmacologic therapy may be warranted, if clinically indicated. Aortic Atherosclerosis (ICD10-I70.0) and Emphysema (ICD10-J43.9). Electronically Signed   By: Vinnie Langton M.D.   On: 12/11/2020 14:52    No results found.  No results found.    Assessment and Plan: Patient Active Problem List   Diagnosis Date Noted   Status post reverse arthroplasty of shoulder, left 09/10/2020   Primary osteoarthritis of left shoulder 12/04/2019   Primary osteoarthritis of right shoulder 12/04/2019   Rotator cuff tendinitis, left 12/04/2019   Statin myopathy 78/58/8502   Umbilical hernia with obstruction, without gangrene 05/31/2019   Panlobular emphysema ([place]) 05/08/2019   Recurrent major depressive disorder, in partial remission ([place]) 05/08/2019   Obstructive chronic bronchitis with exacerbation ([place]) 01/30/2019   Oxygen dependent 01/30/2019   S/P Nissen  fundoplication (with gastrostomy tube placement) ([place]) 10/19/2017   CAD (coronary artery disease), native coronary artery 10/18/2017   Hiatal hernia 10/15/2017   Abnormal EKG 10/15/2017   Dysphagia    Schatzki's ring    Gastric erosion    Advanced care planning/counseling discussion 04/21/2017   Acute on chronic respiratory failure with hypoxia ([place]) 05/08/2016   COPD exacerbation ([place]) 05/08/2016   Hyperglycemia 05/08/2016   Dyspnea 05/06/2016   Encounter for hepatitis C screening test for low risk patient 03/19/2016   Essential hypertension 03/19/2015   COPD (chronic obstructive pulmonary disease) ([place]) 03/19/2015   Hyperlipidemia 03/19/2015   Osteoarthritis of both hands 03/19/2015   Osteoporosis 03/19/2015   Acid reflux 03/19/2015   Major depression, chronic 09/12/2014    1. Chronic respiratory failure with hypoxia (HCC) On oxygen therapy plan is going to continue with current regimen.  2. Oxygen dependent Continue with oxygen therapy  3. COPD, frequent exacerbations ([place]) Now improving patient has completed steroids continues to take the antibiotics  4. Bronchiectasis without complication ([place]) She is coming off of a acute flareup we will continue to follow for any further complications  General Counseling: I have discussed the findings of the evaluation and examination with Dorian Pod.  I have also discussed any further diagnostic evaluation thatmay be needed or ordered today. Raylene verbalizes understanding of the findings of todays visit. We also reviewed her medications today and discussed drug interactions and side effects including but not limited excessive drowsiness and altered mental states. We also discussed that there is always a risk not just to her but also people around her. she has been encouraged to call the office with any questions or concerns that should arise related to todays visit.  No orders of the defined types were placed in this encounter.    Time  spent: 102  I have personally obtained a history, examined the patient, evaluated laboratory and imaging results, formulated the assessment and plan and placed orders.    Allyne Gee, MD [facility] Pulmonary and Critical Care Sleep medicine

## 2022-01-29 NOTE — Patient Instructions (Signed)
Chronic Obstructive Pulmonary Disease  Chronic obstructive pulmonary disease (COPD) is a long-term (chronic) lung problem. When you have COPD, it is hard for air to get in and out of your lungs. Usually the condition gets worse over time, and your lungs will never return to normal. There are things you can do to keep yourself as healthy as possible. What are the causes? Smoking. This is the most common cause. Certain genes passed from parent to child (inherited). What increases the risk? Being exposed to secondhand smoke from cigarettes, pipes, or cigars. Being exposed to chemicals and other irritants, such as fumes and dust in the work environment. Having chronic lung conditions or infections. What are the signs or symptoms? Shortness of breath, especially during physical activity. A long-term cough with a large amount of thick mucus. Sometimes, the cough may not have any mucus (dry cough). Wheezing. Breathing quickly. Skin that looks gray or blue, especially in the fingers, toes, or lips. Feeling tired (fatigue). Weight loss. Chest tightness. Having infections often. Episodes when breathing symptoms become much worse (exacerbations). At the later stages of this disease, you may have swelling in the ankles, feet, or legs. How is this treated? Taking medicines. Quitting smoking, if you smoke. Rehabilitation. This includes steps to make your body work better. It may involve a team of specialists. Doing exercises. Making changes to your diet. Using oxygen. Lung surgery. Lung transplant. Comfort measures (palliative care). Follow these instructions at home: Medicines Take over-the-counter and prescription medicines only as told by your doctor. Talk to your doctor before taking any cough or allergy medicines. You may need to avoid medicines that cause your lungs to be dry. Lifestyle If you smoke, stop smoking. Smoking makes the problem worse. Do not smoke or use any products that  contain nicotine or tobacco. If you need help quitting, ask your doctor. Avoid being around things that make your breathing worse. This may include smoke, chemicals, and fumes. Stay active, but remember to rest as well. Learn and use tips on how to manage stress and control your breathing. Make sure you get enough sleep. Most adults need at least 7 hours of sleep every night. Eat healthy foods. Eat smaller meals more often. Rest before meals. Controlled breathing Learn and use tips on how to control your breathing as told by your doctor. Try: Breathing in (inhaling) through your nose for 1 second. Then, pucker your lips and breath out (exhale) through your lips for 2 seconds. Putting one hand on your belly (abdomen). Breathe in slowly through your nose for 1 second. Your hand on your belly should move out. Pucker your lips and breathe out slowly through your lips. Your hand on your belly should move in as you breathe out.  Controlled coughing Learn and use controlled coughing to clear mucus from your lungs. Follow these steps: Lean your head a little forward. Breathe in deeply. Try to hold your breath for 3 seconds. Keep your mouth slightly open while coughing 2 times. Spit any mucus out into a tissue. Rest and do the steps again 1 or 2 times as needed. General instructions Make sure you get all the shots (vaccines) that your doctor recommends. Ask your doctor about a flu shot and a pneumonia shot. Use oxygen therapy and pulmonary rehabilitation if told by your doctor. If you need home oxygen therapy, ask your doctor if you should buy a tool to measure your oxygen level (oximeter). Make a COPD action plan with your doctor. This helps you   to know what to do if you feel worse than usual. Manage any other conditions you have as told by your doctor. Avoid going outside when it is very hot, cold, or humid. Avoid people who have a sickness you can catch (contagious). Keep all follow-up  visits. Contact a doctor if: You cough up more mucus than usual. There is a change in the color or thickness of the mucus. It is harder to breathe than usual. Your breathing is faster than usual. You have trouble sleeping. You need to use your medicines more often than usual. You have trouble doing your normal activities such as getting dressed or walking around the house. Get help right away if: You have shortness of breath while resting. You have shortness of breath that stops you from: Being able to talk. Doing normal activities. Your chest hurts for longer than 5 minutes. Your skin color is more blue than usual. Your pulse oximeter shows that you have low oxygen for longer than 5 minutes. You have a fever. You feel too tired to breathe normally. These symptoms may represent a serious problem that is an emergency. Do not wait to see if the symptoms will go away. Get medical help right away. Call your local emergency services (911 in the [place] Do not drive yourself to the hospital. Summary Chronic obstructive pulmonary disease (COPD) is a long-term lung problem. The way your lungs work will never return to normal. Usually the condition gets worse over time. There are things you can do to keep yourself as healthy as possible. Take over-the-counter and prescription medicines only as told by your doctor. If you smoke, stop. Smoking makes the problem worse. This information is not intended to replace advice given to you by your health care provider. Make sure you discuss any questions you have with your health care provider. Document Revised: 02/06/2020 Document Reviewed: 02/06/2020 Elsevier Patient Education  2023 [employer] Inc[employer]  

## 2022-02-16 ENCOUNTER — Other Ambulatory Visit: Payer: Self-pay | Admitting: Internal Medicine

## 2022-02-16 DIAGNOSIS — D2262 Melanocytic nevi of left upper limb, including shoulder: Secondary | ICD-10-CM | POA: Diagnosis not present

## 2022-02-16 DIAGNOSIS — D225 Melanocytic nevi of trunk: Secondary | ICD-10-CM | POA: Diagnosis not present

## 2022-02-16 DIAGNOSIS — L249 Irritant contact dermatitis, unspecified cause: Secondary | ICD-10-CM | POA: Diagnosis not present

## 2022-02-16 DIAGNOSIS — D2261 Melanocytic nevi of right upper limb, including shoulder: Secondary | ICD-10-CM | POA: Diagnosis not present

## 2022-02-16 DIAGNOSIS — L821 Other seborrheic keratosis: Secondary | ICD-10-CM | POA: Diagnosis not present

## 2022-03-02 ENCOUNTER — Other Ambulatory Visit: Payer: Self-pay | Admitting: Internal Medicine

## 2022-03-02 DIAGNOSIS — J449 Chronic obstructive pulmonary disease, unspecified: Secondary | ICD-10-CM

## 2022-03-09 ENCOUNTER — Ambulatory Visit: Payer: PPO | Admitting: Internal Medicine

## 2022-04-27 ENCOUNTER — Telehealth: Payer: Self-pay

## 2022-04-27 DIAGNOSIS — J449 Chronic obstructive pulmonary disease, unspecified: Secondary | ICD-10-CM

## 2022-04-27 MED ORDER — TIOTROPIUM BROMIDE MONOHYDRATE 18 MCG IN CAPS: 18.0000 ug | ORAL_CAPSULE | Freq: Every day | RESPIRATORY_TRACT | 2 refills | Status: DC

## 2022-04-27 NOTE — Telephone Encounter (Signed)
Sent patient's Spiriva.

## 2022-05-07 ENCOUNTER — Other Ambulatory Visit: Payer: Self-pay | Admitting: Internal Medicine

## 2022-05-11 ENCOUNTER — Other Ambulatory Visit: Payer: Self-pay

## 2022-05-19 ENCOUNTER — Telehealth: Payer: Self-pay | Admitting: Internal Medicine

## 2022-05-19 DIAGNOSIS — I10 Essential (primary) hypertension: Secondary | ICD-10-CM | POA: Diagnosis not present

## 2022-05-19 DIAGNOSIS — J439 Emphysema, unspecified: Secondary | ICD-10-CM | POA: Diagnosis not present

## 2022-05-19 DIAGNOSIS — R7303 Prediabetes: Secondary | ICD-10-CM | POA: Diagnosis not present

## 2022-05-19 DIAGNOSIS — E782 Mixed hyperlipidemia: Secondary | ICD-10-CM | POA: Diagnosis not present

## 2022-05-19 DIAGNOSIS — Z79899 Other long term (current) drug therapy: Secondary | ICD-10-CM | POA: Diagnosis not present

## 2022-05-19 DIAGNOSIS — M79605 Pain in left leg: Secondary | ICD-10-CM | POA: Diagnosis not present

## 2022-05-19 DIAGNOSIS — Z0000 Encounter for general adult medical examination without abnormal findings: Secondary | ICD-10-CM | POA: Diagnosis not present

## 2022-05-19 DIAGNOSIS — J9611 Chronic respiratory failure with hypoxia: Secondary | ICD-10-CM | POA: Diagnosis not present

## 2022-05-19 DIAGNOSIS — Z1231 Encounter for screening mammogram for malignant neoplasm of breast: Secondary | ICD-10-CM | POA: Diagnosis not present

## 2022-05-19 DIAGNOSIS — T466X5A Adverse effect of antihyperlipidemic and antiarteriosclerotic drugs, initial encounter: Secondary | ICD-10-CM | POA: Diagnosis not present

## 2022-05-19 DIAGNOSIS — R7309 Other abnormal glucose: Secondary | ICD-10-CM | POA: Diagnosis not present

## 2022-05-19 DIAGNOSIS — F3341 Major depressive disorder, recurrent, in partial remission: Secondary | ICD-10-CM | POA: Diagnosis not present

## 2022-05-19 DIAGNOSIS — G720 Drug-induced myopathy: Secondary | ICD-10-CM | POA: Diagnosis not present

## 2022-05-19 DIAGNOSIS — J431 Panlobular emphysema: Secondary | ICD-10-CM | POA: Diagnosis not present

## 2022-05-19 NOTE — Telephone Encounter (Signed)
Received O2 Therapy Order from [facility]. Gave to dfk for signature-Toni

## 2022-05-20 ENCOUNTER — Other Ambulatory Visit: Payer: Self-pay | Admitting: Internal Medicine

## 2022-05-20 DIAGNOSIS — Z1231 Encounter for screening mammogram for malignant neoplasm of breast: Secondary | ICD-10-CM

## 2022-05-25 ENCOUNTER — Telehealth: Payer: Self-pay | Admitting: Internal Medicine

## 2022-05-25 DIAGNOSIS — M79605 Pain in left leg: Secondary | ICD-10-CM | POA: Diagnosis not present

## 2022-05-25 NOTE — Telephone Encounter (Signed)
[id number] O2 recert order faxed back to [place]; [phone].Scanned-Toni

## 2022-06-02 DIAGNOSIS — Z79899 Other long term (current) drug therapy: Secondary | ICD-10-CM | POA: Diagnosis not present

## 2022-06-02 DIAGNOSIS — E782 Mixed hyperlipidemia: Secondary | ICD-10-CM | POA: Diagnosis not present

## 2022-06-02 DIAGNOSIS — R7309 Other abnormal glucose: Secondary | ICD-10-CM | POA: Diagnosis not present

## 2022-07-14 DIAGNOSIS — H353124 Nonexudative age-related macular degeneration, left eye, advanced atrophic with subfoveal involvement: Secondary | ICD-10-CM | POA: Diagnosis not present

## 2022-07-14 DIAGNOSIS — H353114 Nonexudative age-related macular degeneration, right eye, advanced atrophic with subfoveal involvement: Secondary | ICD-10-CM | POA: Diagnosis not present

## 2022-07-14 DIAGNOSIS — Z961 Presence of intraocular lens: Secondary | ICD-10-CM | POA: Diagnosis not present

## 2022-07-27 ENCOUNTER — Other Ambulatory Visit: Payer: Self-pay | Admitting: Internal Medicine

## 2022-07-27 DIAGNOSIS — J449 Chronic obstructive pulmonary disease, unspecified: Secondary | ICD-10-CM

## 2022-07-30 ENCOUNTER — Encounter: Payer: Self-pay | Admitting: Internal Medicine

## 2022-07-30 ENCOUNTER — Ambulatory Visit (INDEPENDENT_AMBULATORY_CARE_PROVIDER_SITE_OTHER): Payer: PPO | Admitting: Internal Medicine

## 2022-07-30 ENCOUNTER — Other Ambulatory Visit: Payer: Self-pay

## 2022-07-30 ENCOUNTER — Telehealth: Payer: Self-pay

## 2022-07-30 VITALS — BP 171/88 | HR 88 | Temp 98.3°F | Resp 16 | Ht 62.0 in | Wt 133.8 lb

## 2022-07-30 DIAGNOSIS — J479 Bronchiectasis, uncomplicated: Secondary | ICD-10-CM

## 2022-07-30 DIAGNOSIS — R0602 Shortness of breath: Secondary | ICD-10-CM

## 2022-07-30 DIAGNOSIS — J9611 Chronic respiratory failure with hypoxia: Secondary | ICD-10-CM | POA: Diagnosis not present

## 2022-07-30 DIAGNOSIS — J441 Chronic obstructive pulmonary disease with (acute) exacerbation: Secondary | ICD-10-CM

## 2022-07-30 MED ORDER — BREZTRI AEROSPHERE 160-9-4.8 MCG/ACT IN AERO: 2.0000 | INHALATION_SPRAY | Freq: Two times a day (BID) | RESPIRATORY_TRACT | 11 refills | Status: DC

## 2022-07-30 MED ORDER — THEOPHYLLINE ER 100 MG PO CP24: 100.0000 mg | ORAL_CAPSULE | Freq: Every day | ORAL | 4 refills | Status: DC

## 2022-07-30 MED ORDER — ROFLUMILAST 250 MCG PO TABS: 250.0000 ug | ORAL_TABLET | Freq: Every day | ORAL | 0 refills | Status: DC

## 2022-07-30 NOTE — Patient Instructions (Signed)
Chronic Obstructive Pulmonary Disease  Chronic obstructive pulmonary disease (COPD) is a long-term (chronic) lung problem. When you have COPD, it is hard for air to get in and out of your lungs. Usually the condition gets worse over time, and your lungs will never return to normal. There are things you can do to keep yourself as healthy as possible. What are the causes? Smoking. This is the most common cause. Certain genes passed from parent to child (inherited). What increases the risk? Being exposed to secondhand smoke from cigarettes, pipes, or cigars. Being exposed to chemicals and other irritants, such as fumes and dust in the work environment. Having chronic lung conditions or infections. What are the signs or symptoms? Shortness of breath, especially during physical activity. A long-term cough with a large amount of thick mucus. Sometimes, the cough may not have any mucus (dry cough). Wheezing. Breathing quickly. Skin that looks gray or blue, especially in the fingers, toes, or lips. Feeling tired (fatigue). Weight loss. Chest tightness. Having infections often. Episodes when breathing symptoms become much worse (exacerbations). At the later stages of this disease, you may have swelling in the ankles, feet, or legs. How is this treated? Taking medicines. Quitting smoking, if you smoke. Rehabilitation. This includes steps to make your body work better. It may involve a team of specialists. Doing exercises. Making changes to your diet. Using oxygen. Lung surgery. Lung transplant. Comfort measures (palliative care). Follow these instructions at home: Medicines Take over-the-counter and prescription medicines only as told by your doctor. Talk to your doctor before taking any cough or allergy medicines. You may need to avoid medicines that cause your lungs to be dry. Lifestyle If you smoke, stop smoking. Smoking makes the problem worse. Do not smoke or use any products that  contain nicotine or tobacco. If you need help quitting, ask your doctor. Avoid being around things that make your breathing worse. This may include smoke, chemicals, and fumes. Stay active, but remember to rest as well. Learn and use tips on how to manage stress and control your breathing. Make sure you get enough sleep. Most adults need at least 7 hours of sleep every night. Eat healthy foods. Eat smaller meals more often. Rest before meals. Controlled breathing Learn and use tips on how to control your breathing as told by your doctor. Try: Breathing in (inhaling) through your nose for 1 second. Then, pucker your lips and breath out (exhale) through your lips for 2 seconds. Putting one hand on your belly (abdomen). Breathe in slowly through your nose for 1 second. Your hand on your belly should move out. Pucker your lips and breathe out slowly through your lips. Your hand on your belly should move in as you breathe out.  Controlled coughing Learn and use controlled coughing to clear mucus from your lungs. Follow these steps: Lean your head a little forward. Breathe in deeply. Try to hold your breath for 3 seconds. Keep your mouth slightly open while coughing 2 times. Spit any mucus out into a tissue. Rest and do the steps again 1 or 2 times as needed. General instructions Make sure you get all the shots (vaccines) that your doctor recommends. Ask your doctor about a flu shot and a pneumonia shot. Use oxygen therapy and pulmonary rehabilitation if told by your doctor. If you need home oxygen therapy, ask your doctor if you should buy a tool to measure your oxygen level (oximeter). Make a COPD action plan with your doctor. This helps you   to know what to do if you feel worse than usual. Manage any other conditions you have as told by your doctor. Avoid going outside when it is very hot, cold, or humid. Avoid people who have a sickness you can catch (contagious). Keep all follow-up  visits. Contact a doctor if: You cough up more mucus than usual. There is a change in the color or thickness of the mucus. It is harder to breathe than usual. Your breathing is faster than usual. You have trouble sleeping. You need to use your medicines more often than usual. You have trouble doing your normal activities such as getting dressed or walking around the house. Get help right away if: You have shortness of breath while resting. You have shortness of breath that stops you from: Being able to talk. Doing normal activities. Your chest hurts for longer than 5 minutes. Your skin color is more blue than usual. Your pulse oximeter shows that you have low oxygen for longer than 5 minutes. You have a fever. You feel too tired to breathe normally. These symptoms may represent a serious problem that is an emergency. Do not wait to see if the symptoms will go away. Get medical help right away. Call your local emergency services (911 in the [place] Do not drive yourself to the hospital. Summary Chronic obstructive pulmonary disease (COPD) is a long-term lung problem. The way your lungs work will never return to normal. Usually the condition gets worse over time. There are things you can do to keep yourself as healthy as possible. Take over-the-counter and prescription medicines only as told by your doctor. If you smoke, stop. Smoking makes the problem worse. This information is not intended to replace advice given to you by your health care provider. Make sure you discuss any questions you have with your health care provider. Document Revised: 02/06/2020 Document Reviewed: 02/06/2020 Elsevier Patient Education  2023 [employer] Inc[employer]  

## 2022-07-30 NOTE — Progress Notes (Signed)
[facility] [address] [place], [place] [address]  Pulmonary Sleep Medicine   Office Visit Note  Patient Name: Joyce Rodriguez DOB: 1946-01-07 MRN [id number]  Date of Service: 07/30/2022  Complaints/HPI: She continues to have some issues with her cough. Patient states she has felt congested. She states there is some nasal drip. Cough is present all the time. She notes maybe a little worse when she lays down. Currently she is taking her albuterol spiriva and advair (generic). Has not been on breztri. She is currently not on theo As far as steroids she is currently off but had a taper recently.  She needs to be on baseline low-dose steroids to help maintain her as her disease process is advancing.  Office Spirometry Results:     ROS  General: (-) fever, (-) chills, (-) night sweats, (-) weakness Skin: (-) rashes, (-) itching,. Eyes: (-) visual changes, (-) redness, (-) itching. Nose and Sinuses: (-) nasal stuffiness or itchiness, (-) postnasal drip, (-) nosebleeds, (-) sinus trouble. Mouth and Throat: (-) sore throat, (-) hoarseness. Neck: (-) swollen glands, (-) enlarged thyroid, (-) neck pain. Respiratory: + cough, (-) bloody sputum, + shortness of breath, + wheezing. Cardiovascular: - ankle swelling, (-) chest pain. Lymphatic: (-) lymph node enlargement. Neurologic: (-) numbness, (-) tingling. Psychiatric: (-) anxiety, (-) depression   Current Medication: Outpatient Encounter Medications as of 07/30/2022  Medication Sig   acetaminophen (TYLENOL) 500 MG tablet Take 1,000 mg by mouth every 6 (six) hours as needed for moderate pain.   albuterol (VENTOLIN HFA) 108 (90 Base) MCG/ACT inhaler Inhale 2 puffs into the lungs every 6 (six) hours as needed. (Patient taking differently: Inhale 2 puffs into the lungs every 6 (six) hours as needed for wheezing or shortness of breath.)   aspirin EC 325 MG tablet Take 1 tablet (325 mg total) by mouth daily.   cholecalciferol  (VITAMIN D) 1000 units tablet Take 1,000 Units by mouth daily.   Docusate Calcium (STOOL SOFTENER PO) Take 2 tablets by mouth daily.   escitalopram (LEXAPRO) 10 MG tablet Take 1 tablet (10 mg total) by mouth daily. (Patient taking differently: Take 10 mg by mouth every morning.)   ezetimibe (ZETIA) 10 MG tablet Take 10 mg by mouth every morning.   fluticasone-salmeterol (WIXELA INHUB) 250-50 MCG/ACT AEPB INHALE 1 PUFF INTO THE LUNGS IN THE MORNING AND AT BEDTIME   Multiple Vitamin (MULTIVITAMIN WITH MINERALS) TABS tablet Take 1 tablet by mouth daily.   omeprazole (PRILOSEC) 20 MG capsule TAKE ONE CAPSULE BY MOUTH DAILY AS NEEDED FOR HEARTBURN   OXYGEN Inhale 2.5-3 L into the lungs at bedtime. 3 L portable   uses [employer] for oxygen   potassium chloride (K-DUR) 10 MEQ tablet Take 1 tablet (10 mEq total) by mouth daily.   predniSONE (STERAPRED UNI-PAK 21 TAB) 10 MG (21) TBPK tablet Take a dose pack as directed   propranolol ER (INDERAL LA) 60 MG 24 hr capsule Take 60 mg by mouth daily.   sulfamethoxazole-trimethoprim (BACTRIM DS) 800-160 MG tablet Take 1 tablet by mouth 2 (two) times daily.   tiotropium (SPIRIVA) 18 MCG inhalation capsule INHALE CONTENTS OF 1 CAPSULE ONCE DAILY USING HANDIHALER   traMADol (ULTRAM) 50 MG tablet Take 1-2 tablets (50-100 mg total) by mouth every 6 (six) hours as needed for moderate pain.   zinc gluconate 50 MG tablet Take 50 mg by mouth daily.   No facility-administered encounter medications on file as of 07/30/2022.    Surgical  History: Past Surgical History:  Procedure Laterality Date   ABDOMINAL HYSTERECTOMY     CHOLECYSTECTOMY     ESOPHAGEAL MANOMETRY N/A 09/13/2017   Procedure: ESOPHAGEAL MANOMETRY (EM);  Surgeon: Napoleon Form, MD;  Location: [facility];  Service: Endoscopy;  Laterality: N/A;   ESOPHAGOGASTRODUODENOSCOPY (EGD) WITH PROPOFOL N/A 05/11/2017   Procedure: ESOPHAGOGASTRODUODENOSCOPY (EGD) WITH PROPOFOL;  Surgeon: Toney Reil, MD;  Location: [facility] ENDOSCOPY;  Service: Gastroenterology;  Laterality: N/A;   EYE SURGERY Bilateral    CATARACTS   FOOT SURGERY     MORTONS NEUROMA   LAPAROSCOPIC NISSEN FUNDOPLICATION N/A 10/19/2017   Procedure: LAPAROSCOPIC NISSEN FUNDOPLICATION;  Surgeon: Leafy Ro, MD;  Location: [facility] ORS;  Service: General;  Laterality: N/A;   NISSEN FUNDOPLICATION N/A 10/19/2017   Procedure: NISSEN FUNDOPLICATION;  Surgeon: Leafy Ro, MD;  Location: [facility] ORS;  Service: General;  Laterality: N/A;   REVERSE SHOULDER ARTHROPLASTY Left 09/10/2020   Procedure: REVERSE SHOULDER ARTHROPLASTY;  Surgeon: Christena Flake, MD;  Location: [facility] ORS;  Service: Orthopedics;  Laterality: Left;    Medical History: Past Medical History:  Diagnosis Date   Aortic atherosclerosis    Arthritis    Complication of anesthesia    difficulty waking up   COPD (chronic obstructive pulmonary disease)    Coronary artery disease    Dyspnea    Emphysema of lung    SEVERE   GERD (gastroesophageal reflux disease)    Headache    H/O   History of hiatal hernia    Hyperlipidemia    Hypertension    H/O OFF MEDS SINCE 2020 BY PCP   Osteoporosis    Oxygen deficiency    uses O2 at night   Oxygen dependent    Pneumonia    Umbilical hernia     Family History: Family History  Problem Relation Age of Onset   Ovarian cancer Mother    Emphysema Father    Melanoma Sister    Lung cancer Brother     Social History: Social History   Socioeconomic History   Marital status: Married    Spouse name: Not on file   Number of children: Not on file   Years of education: 12   Highest education level: 12th grade  Occupational History   Occupation: retired  Tobacco Use   Smoking status: Former    Packs/day: 1.50    Years: 30.00    Additional pack years: 0.00    Total pack years: 45.00    Types: Cigarettes    Quit date: 03/18/2001    Years since quitting: 21.3   Smokeless tobacco: Never  Vaping Use   Vaping  Use: Never used  Substance and Sexual Activity   Alcohol use: Yes    Comment: rare   Drug use: No   Sexual activity: Not Currently  Other Topics Concern   Not on file  Social History Narrative   Not on file   Social Determinants of Health   Financial Resource Strain: Low Risk  (04/19/2017)   Overall Financial Resource Strain (CARDIA)    Difficulty of Paying Living Expenses: Not hard at all  Food Insecurity: No Food Insecurity (04/19/2017)   Hunger Vital Sign    Worried About Running Out of Food in the Last Year: Never true    Ran Out of Food in the Last Year: Never true  Transportation Needs: No Transportation Needs (04/19/2017)   PRAPARE - Administrator, Civil Service (Medical): No  Lack of Transportation (Non-Medical): No  Physical Activity: Inactive (04/19/2017)   Exercise Vital Sign    Days of Exercise per Week: 0 days    Minutes of Exercise per Session: 0 min  Stress: No Stress Concern Present (04/19/2017)   [employer] of Occupational Health - Occupational Stress Questionnaire    Feeling of Stress : Not at all  Social Connections: Moderately Integrated (04/19/2017)   Social Connection and Isolation Panel [NHANES]    Frequency of Communication with Friends and Family: More than three times a week    Frequency of Social Gatherings with Friends and Family: More than three times a week    Attends Religious Services: More than 4 times per year    Active Member of [employer] or Organizations: No    Attends Banker Meetings: Never    Marital Status: Married  Catering manager Violence: Not At Risk (04/19/2017)   Humiliation, Afraid, Rape, and Kick questionnaire    Fear of Current or Ex-Partner: No    Emotionally Abused: No    Physically Abused: No    Sexually Abused: No    Vital Signs: Blood pressure (!) 171/88, pulse 88, temperature 98.3 F (36.8 C), resp. rate 16, height 5\' 2"  (1.575 m), weight 133 lb 12.8 oz (60.7 kg), SpO2 91  %.  Examination: General Appearance: The patient is well-developed, well-nourished, and in no distress. Skin: Gross inspection of skin unremarkable. Head: normocephalic, no gross deformities. Eyes: no gross deformities noted. ENT: ears appear grossly normal no exudates. Neck: Supple. No thyromegaly. No LAD. Respiratory: distant some rhonchi noted. Cardiovascular: Normal S1 and S2 without murmur or rub. Extremities: No cyanosis. pulses are equal. Neurologic: Alert and oriented. No involuntary movements.  LABS: No results found for this or any previous visit (from the past 2160 hour(s)).  Radiology: CT Chest High Resolution  Result Date: 12/11/2020 CLINICAL DATA:  77 year old female with history of bronchiolectasis. EXAM: CT CHEST WITHOUT CONTRAST TECHNIQUE: Multidetector CT imaging of the chest was performed following the standard protocol without intravenous contrast. High resolution imaging of the lungs, as well as inspiratory and expiratory imaging, was performed. COMPARISON:  Chest x-ray 06/21/2020. FINDINGS: Cardiovascular: Heart size is normal. There is no significant pericardial fluid, thickening or pericardial calcification. There is aortic atherosclerosis, as well as atherosclerosis of the great vessels of the mediastinum and the coronary arteries, including calcified atherosclerotic plaque in the left main, left anterior descending, left circumflex and right coronary arteries. Mediastinum/Nodes: No pathologically enlarged mediastinal or hilar lymph nodes. Esophagus is unremarkable in appearance. No axillary lymphadenopathy. Lungs/Pleura: Chronic mass-like area of architectural distortion and volume loss in the medial segment of the right middle lobe, similar to numerous prior examinations dating back to 2011, most compatible with an area of chronic post infectious or inflammatory scarring. Similar smaller lesion is also noted in the posterior aspect of the lingula. No other suspicious  appearing pulmonary nodules or masses are noted. Scattered areas of mild cylindrical bronchiectasis and mucoid impaction are noted in the lungs bilaterally. No generalized areas of septal thickening, subpleural reticulation or honeycombing are noted to suggest interstitial lung disease. Diffuse bronchial wall thickening with severe centrilobular and paraseptal emphysema. No acute consolidative airspace disease. No pleural effusions. Upper Abdomen: Aortic atherosclerosis.  Status post cholecystectomy. Musculoskeletal: There are no aggressive appearing lytic or blastic lesions noted in the visualized portions of the skeleton. Status post left shoulder arthroplasty. IMPRESSION: 1. No findings to suggest interstitial lung disease. 2. Scattered areas of bronchiectasis and post  infectious or inflammatory scarring, similar to prior studies, as above. 3. Diffuse bronchial wall thickening with severe centrilobular and paraseptal emphysema; imaging findings compatible with reported clinical history of COPD. 4. Aortic atherosclerosis, in addition to left main and 3 vessel coronary artery disease. Assessment for potential risk factor modification, dietary therapy or pharmacologic therapy may be warranted, if clinically indicated. Aortic Atherosclerosis (ICD10-I70.0) and Emphysema (ICD10-J43.9). Electronically Signed   By: Trudie Reed M.D.   On: 12/11/2020 14:52    No results found.  No results found.  Assessment and Plan: Patient Active Problem List   Diagnosis Date Noted   Status post reverse arthroplasty of shoulder, left 09/10/2020   Primary osteoarthritis of left shoulder 12/04/2019   Primary osteoarthritis of right shoulder 12/04/2019   Rotator cuff tendinitis, left 12/04/2019   Statin myopathy 11/02/2019   Umbilical hernia with obstruction, without gangrene 05/31/2019   Panlobular emphysema 05/08/2019   Recurrent major depressive disorder, in partial remission 05/08/2019   Obstructive chronic  bronchitis with exacerbation 01/30/2019   Oxygen dependent 01/30/2019   S/P Nissen fundoplication (with gastrostomy tube placement) 10/19/2017   CAD (coronary artery disease), native coronary artery 10/18/2017   Hiatal hernia 10/15/2017   Abnormal EKG 10/15/2017   Dysphagia    Schatzki's ring    Gastric erosion    Advanced care planning/counseling discussion 04/21/2017   Acute on chronic respiratory failure with hypoxia 05/08/2016   COPD exacerbation 05/08/2016   Hyperglycemia 05/08/2016   Dyspnea 05/06/2016   Encounter for hepatitis C screening test for low risk patient 03/19/2016   Essential hypertension 03/19/2015   COPD (chronic obstructive pulmonary disease) 03/19/2015   Hyperlipidemia 03/19/2015   Osteoarthritis of both hands 03/19/2015   Osteoporosis 03/19/2015   Acid reflux 03/19/2015   Major depression, chronic 09/12/2014    1. SOB (shortness of breath) Advanced severe COPD as cause of her severe shortness of breath.  Plan is going to be to get her maintenance medications and her consider starting theophylline also.  Patient may be inevitable the end. - Spirometry with graph  2. COPD, frequent exacerbations  - Roflumilast (DALIRESP) 250 MCG TABS; Take 250 mcg by mouth daily.  Dispense: 28 tablet; Refill: 0 - theophylline (THEO-24) 100 MG 24 hr capsule; Take 1 capsule (100 mg total) by mouth daily.  Dispense: 30 capsule; Refill: 4 - Budeson-Glycopyrrol-Formoterol (BREZTRI AEROSPHERE) 160-9-4.8 MCG/ACT AERO; Inhale 2 puffs into the lungs 2 (two) times daily.  Dispense: 10.7 g; Refill: 11  3. Bronchiectasis without complication Severe bronchiectasis previous infections.  She gets occasional flareups will treat with antibiotics as needed.  4. Chronic respiratory failure with hypoxia She needs to continue with oxygen therapy she is such severe hypoxia that she is not able to stay off of oxygen for any prolonged periods of time.  General Counseling: I have discussed the  findings of the evaluation and examination with Alvino Chapel.  I have also discussed any further diagnostic evaluation thatmay be needed or ordered today. Lanasia verbalizes understanding of the findings of todays visit. We also reviewed her medications today and discussed drug interactions and side effects including but not limited excessive drowsiness and altered mental states. We also discussed that there is always a risk not just to her but also people around her. she has been encouraged to call the office with any questions or concerns that should arise related to todays visit.  No orders of the defined types were placed in this encounter.    Time spent: 45  I have  personally obtained a history, examined the patient, evaluated laboratory and imaging results, formulated the assessment and plan and placed orders.    Allyne Gee, MD [facility] Pulmonary and Critical Care Sleep medicine

## 2022-07-30 NOTE — Telephone Encounter (Signed)
Completed P.A. for patient's Roflumilast.

## 2022-07-31 ENCOUNTER — Telehealth: Payer: Self-pay

## 2022-07-31 NOTE — Telephone Encounter (Addendum)
Patient approved for Roflumilast and Theo-24.

## 2022-08-03 ENCOUNTER — Other Ambulatory Visit: Payer: Self-pay

## 2022-08-03 DIAGNOSIS — J441 Chronic obstructive pulmonary disease with (acute) exacerbation: Secondary | ICD-10-CM

## 2022-08-03 MED ORDER — THEOPHYLLINE ER 100 MG PO CP24
100.0000 mg | ORAL_CAPSULE | Freq: Every day | ORAL | 4 refills | Status: DC
Start: 2022-08-03 — End: 2022-12-31

## 2022-08-13 ENCOUNTER — Ambulatory Visit (INDEPENDENT_AMBULATORY_CARE_PROVIDER_SITE_OTHER): Payer: PPO | Admitting: Internal Medicine

## 2022-08-13 ENCOUNTER — Encounter: Payer: Self-pay | Admitting: Internal Medicine

## 2022-08-13 VITALS — BP 150/90 | HR 104 | Temp 98.1°F | Resp 16 | Ht 62.0 in | Wt 133.4 lb

## 2022-08-13 DIAGNOSIS — F419 Anxiety disorder, unspecified: Secondary | ICD-10-CM

## 2022-08-13 DIAGNOSIS — J441 Chronic obstructive pulmonary disease with (acute) exacerbation: Secondary | ICD-10-CM | POA: Diagnosis not present

## 2022-08-13 DIAGNOSIS — J9611 Chronic respiratory failure with hypoxia: Secondary | ICD-10-CM | POA: Diagnosis not present

## 2022-08-13 DIAGNOSIS — J479 Bronchiectasis, uncomplicated: Secondary | ICD-10-CM

## 2022-08-13 MED ORDER — PREDNISONE 10 MG PO TABS
10.0000 mg | ORAL_TABLET | Freq: Every day | ORAL | 4 refills | Status: AC
Start: 2022-08-13 — End: ?

## 2022-08-13 MED ORDER — ALPRAZOLAM 0.25 MG PO TABS
0.1250 mg | ORAL_TABLET | Freq: Two times a day (BID) | ORAL | 0 refills | Status: DC | PRN
Start: 2022-08-13 — End: 2023-07-22

## 2022-08-13 NOTE — Progress Notes (Signed)
[facility] [address] [place], [place] [address]  Pulmonary Sleep Medicine   Office Visit Note  Patient Name: Joyce Rodriguez DOB: [birth year] MRN [id number]  Date of Service: 08/13/2022  Complaints/HPI: She states that she had some issues with low oxygen and also had attended a birthday party. She was noted increased HR also. In addition she had started on breztri. Her daughter was concerned regarding prognosis. We had a good conversation regarding prognosis and she understands that she may not have that much time left she has actually been ready for the inevitable for quite some time.  She states she will be preferring not to be placed on mechanical ventilation if it ever came to that  Office Spirometry Results:     ROS  General: (-) fever, (-) chills, (-) night sweats, (-) weakness Skin: (-) rashes, (-) itching,. Eyes: (-) visual changes, (-) redness, (-) itching. Nose and Sinuses: (-) nasal stuffiness or itchiness, (-) postnasal drip, (-) nosebleeds, (-) sinus trouble. Mouth and Throat: (-) sore throat, (-) hoarseness. Neck: (-) swollen glands, (-) enlarged thyroid, (-) neck pain. Respiratory: + cough, (-) bloody sputum, + shortness of breath, + wheezing. Cardiovascular: - ankle swelling, (-) chest pain. Lymphatic: (-) lymph node enlargement. Neurologic: (-) numbness, (-) tingling. Psychiatric: (-) anxiety, (-) depression   Current Medication: Outpatient Encounter Medications as of 08/13/2022  Medication Sig   acetaminophen (TYLENOL) 500 MG tablet Take 1,000 mg by mouth every 6 (six) hours as needed for moderate pain.   albuterol (VENTOLIN HFA) 108 (90 Base) MCG/ACT inhaler Inhale 2 puffs into the lungs every 6 (six) hours as needed. (Patient taking differently: Inhale 2 puffs into the lungs every 6 (six) hours as needed for wheezing or shortness of breath.)   aspirin EC 325 MG tablet Take 1 tablet (325 mg total) by mouth daily.   Budeson-Glycopyrrol-Formoterol  (BREZTRI AEROSPHERE) 160-9-4.8 MCG/ACT AERO Inhale 2 puffs into the lungs 2 (two) times daily.   cholecalciferol (VITAMIN D) 1000 units tablet Take 1,000 Units by mouth daily.   Docusate Calcium (STOOL SOFTENER PO) Take 2 tablets by mouth daily.   escitalopram (LEXAPRO) 10 MG tablet Take 1 tablet (10 mg total) by mouth daily. (Patient taking differently: Take 10 mg by mouth every morning.)   ezetimibe (ZETIA) 10 MG tablet Take 10 mg by mouth every morning.   fluticasone-salmeterol (WIXELA INHUB) 250-50 MCG/ACT AEPB INHALE 1 PUFF INTO THE LUNGS IN THE MORNING AND AT BEDTIME   Multiple Vitamin (MULTIVITAMIN WITH MINERALS) TABS tablet Take 1 tablet by mouth daily.   omeprazole (PRILOSEC) 20 MG capsule TAKE ONE CAPSULE BY MOUTH DAILY AS NEEDED FOR HEARTBURN   OXYGEN Inhale 2.5-3 L into the lungs at bedtime. 3 L portable   uses [employer] for oxygen   potassium chloride (K-DUR) 10 MEQ tablet Take 1 tablet (10 mEq total) by mouth daily.   predniSONE (STERAPRED UNI-PAK 21 TAB) 10 MG (21) TBPK tablet Take a dose pack as directed   propranolol ER (INDERAL LA) 60 MG 24 hr capsule Take 60 mg by mouth daily.   Roflumilast (DALIRESP) 250 MCG TABS Take 250 mcg by mouth daily.   sulfamethoxazole-trimethoprim (BACTRIM DS) 800-160 MG tablet Take 1 tablet by mouth 2 (two) times daily.   theophylline (THEO-24) 100 MG 24 hr capsule Take 1 capsule (100 mg total) by mouth daily.   tiotropium (SPIRIVA) 18 MCG inhalation capsule INHALE CONTENTS OF 1 CAPSULE ONCE DAILY USING HANDIHALER   traMADol (ULTRAM) 50 MG tablet  Take 1-2 tablets (50-100 mg total) by mouth every 6 (six) hours as needed for moderate pain.   zinc gluconate 50 MG tablet Take 50 mg by mouth daily.   No facility-administered encounter medications on file as of 08/13/2022.    Surgical History: Past Surgical History:  Procedure Laterality Date   ABDOMINAL HYSTERECTOMY     CHOLECYSTECTOMY     ESOPHAGEAL MANOMETRY N/A 09/13/2017   Procedure:  ESOPHAGEAL MANOMETRY (EM);  Surgeon: Napoleon Form, MD;  Location: [facility] ENDOSCOPY;  Service: Endoscopy;  Laterality: N/A;   ESOPHAGOGASTRODUODENOSCOPY (EGD) WITH PROPOFOL N/A 05/11/2017   Procedure: ESOPHAGOGASTRODUODENOSCOPY (EGD) WITH PROPOFOL;  Surgeon: Toney Reil, MD;  Location: [facility] ENDOSCOPY;  Service: Gastroenterology;  Laterality: N/A;   EYE SURGERY Bilateral    CATARACTS   FOOT SURGERY     MORTONS NEUROMA   LAPAROSCOPIC NISSEN FUNDOPLICATION N/A 10/19/2017   Procedure: LAPAROSCOPIC NISSEN FUNDOPLICATION;  Surgeon: Leafy Ro, MD;  Location: [facility] ORS;  Service: General;  Laterality: N/A;   NISSEN FUNDOPLICATION N/A 10/19/2017   Procedure: NISSEN FUNDOPLICATION;  Surgeon: Leafy Ro, MD;  Location: [facility] ORS;  Service: General;  Laterality: N/A;   REVERSE SHOULDER ARTHROPLASTY Left 09/10/2020   Procedure: REVERSE SHOULDER ARTHROPLASTY;  Surgeon: Christena Flake, MD;  Location: [facility] ORS;  Service: Orthopedics;  Laterality: Left;    Medical History: Past Medical History:  Diagnosis Date   Aortic atherosclerosis (HCC)    Arthritis    Complication of anesthesia    difficulty waking up   COPD (chronic obstructive pulmonary disease) (HCC)    Coronary artery disease    Dyspnea    Emphysema of lung (HCC)    SEVERE   GERD (gastroesophageal reflux disease)    Headache    H/O   History of hiatal hernia    Hyperlipidemia    Hypertension    H/O OFF MEDS SINCE 2020 BY PCP   Osteoporosis    Oxygen deficiency    uses O2 at night   Oxygen dependent    Pneumonia    Umbilical hernia     Family History: Family History  Problem Relation Age of Onset   Ovarian cancer Mother    Emphysema Father    Melanoma Sister    Lung cancer Brother     Social History: Social History   Socioeconomic History   Marital status: Married    Spouse name: Not on file   Number of children: Not on file   Years of education: 12   Highest education level: 12th grade  Occupational  History   Occupation: retired  Tobacco Use   Smoking status: Former    Packs/day: 1.50    Years: 30.00    Additional pack years: 0.00    Total pack years: 45.00    Types: Cigarettes    Quit date: 03/18/2001    Years since quitting: 21.4   Smokeless tobacco: Never  Vaping Use   Vaping Use: Never used  Substance and Sexual Activity   Alcohol use: Yes    Comment: rare   Drug use: No   Sexual activity: Not Currently  Other Topics Concern   Not on file  Social History Narrative   Not on file   Social Determinants of Health   Financial Resource Strain: Low Risk  (04/19/2017)   Overall Financial Resource Strain (CARDIA)    Difficulty of Paying Living Expenses: Not hard at all  Food Insecurity: No Food Insecurity (04/19/2017)   Hunger Vital Sign  Worried About Programme researcher, broadcasting/film/video in the Last Year: Never true    Ran Out of Food in the Last Year: Never true  Transportation Needs: No Transportation Needs (04/19/2017)   PRAPARE - Administrator, Civil Service (Medical): No    Lack of Transportation (Non-Medical): No  Physical Activity: Inactive (04/19/2017)   Exercise Vital Sign    Days of Exercise per Week: 0 days    Minutes of Exercise per Session: 0 min  Stress: No Stress Concern Present (04/19/2017)   [employer] of Occupational Health - Occupational Stress Questionnaire    Feeling of Stress : Not at all  Social Connections: Moderately Integrated (04/19/2017)   Social Connection and Isolation Panel [NHANES]    Frequency of Communication with Friends and Family: More than three times a week    Frequency of Social Gatherings with Friends and Family: More than three times a week    Attends Religious Services: More than 4 times per year    Active Member of [employer] or Organizations: No    Attends Banker Meetings: Never    Marital Status: Married  Catering manager Violence: Not At Risk (04/19/2017)   Humiliation, Afraid, Rape, and Kick questionnaire    Fear of  Current or Ex-Partner: No    Emotionally Abused: No    Physically Abused: No    Sexually Abused: No    Vital Signs: Blood pressure (!) 150/90, pulse (!) 104, temperature 98.1 F (36.7 C), resp. rate 16, height 5\' 2"  (1.575 m), weight 133 lb 6.4 oz (60.5 kg), SpO2 94 %.  Examination: General Appearance: The patient is well-developed, well-nourished, and in no distress. Skin: Gross inspection of skin unremarkable. Head: normocephalic, no gross deformities. Eyes: no gross deformities noted. ENT: ears appear grossly normal no exudates. Neck: Supple. No thyromegaly. No LAD. Respiratory: no rhonchi noted. Cardiovascular: Normal S1 and S2 without murmur or rub. Extremities: No cyanosis. pulses are equal. Neurologic: Alert and oriented. No involuntary movements.  LABS: No results found for this or any previous visit (from the past 2160 hour(s)).  Radiology: CT Chest High Resolution  Result Date: 12/11/2020 CLINICAL DATA:  77 year old female with history of bronchiolectasis. EXAM: CT CHEST WITHOUT CONTRAST TECHNIQUE: Multidetector CT imaging of the chest was performed following the standard protocol without intravenous contrast. High resolution imaging of the lungs, as well as inspiratory and expiratory imaging, was performed. COMPARISON:  Chest x-ray 06/21/2020. FINDINGS: Cardiovascular: Heart size is normal. There is no significant pericardial fluid, thickening or pericardial calcification. There is aortic atherosclerosis, as well as atherosclerosis of the great vessels of the mediastinum and the coronary arteries, including calcified atherosclerotic plaque in the left main, left anterior descending, left circumflex and right coronary arteries. Mediastinum/Nodes: No pathologically enlarged mediastinal or hilar lymph nodes. Esophagus is unremarkable in appearance. No axillary lymphadenopathy. Lungs/Pleura: Chronic mass-like area of architectural distortion and volume loss in the medial segment of  the right middle lobe, similar to numerous prior examinations dating back to 2011, most compatible with an area of chronic post infectious or inflammatory scarring. Similar smaller lesion is also noted in the posterior aspect of the lingula. No other suspicious appearing pulmonary nodules or masses are noted. Scattered areas of mild cylindrical bronchiectasis and mucoid impaction are noted in the lungs bilaterally. No generalized areas of septal thickening, subpleural reticulation or honeycombing are noted to suggest interstitial lung disease. Diffuse bronchial wall thickening with severe centrilobular and paraseptal emphysema. No acute consolidative airspace disease. No pleural  effusions. Upper Abdomen: Aortic atherosclerosis.  Status post cholecystectomy. Musculoskeletal: There are no aggressive appearing lytic or blastic lesions noted in the visualized portions of the skeleton. Status post left shoulder arthroplasty. IMPRESSION: 1. No findings to suggest interstitial lung disease. 2. Scattered areas of bronchiectasis and post infectious or inflammatory scarring, similar to prior studies, as above. 3. Diffuse bronchial wall thickening with severe centrilobular and paraseptal emphysema; imaging findings compatible with reported clinical history of COPD. 4. Aortic atherosclerosis, in addition to left main and 3 vessel coronary artery disease. Assessment for potential risk factor modification, dietary therapy or pharmacologic therapy may be warranted, if clinically indicated. Aortic Atherosclerosis (ICD10-I70.0) and Emphysema (ICD10-J43.9). Electronically Signed   By: Trudie Reed M.D.   On: 12/11/2020 14:52    No results found.  No results found.  Assessment and Plan: Patient Active Problem List   Diagnosis Date Noted   Status post reverse arthroplasty of shoulder, left 09/10/2020   Primary osteoarthritis of left shoulder 12/04/2019   Primary osteoarthritis of right shoulder 12/04/2019   Rotator  cuff tendinitis, left 12/04/2019   Statin myopathy 11/02/2019   Umbilical hernia with obstruction, without gangrene 05/31/2019   Panlobular emphysema (HCC) 05/08/2019   Recurrent major depressive disorder, in partial remission (HCC) 05/08/2019   Obstructive chronic bronchitis with exacerbation (HCC) 01/30/2019   Oxygen dependent 01/30/2019   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) 10/19/2017   CAD (coronary artery disease), native coronary artery 10/18/2017   Hiatal hernia 10/15/2017   Abnormal EKG 10/15/2017   Dysphagia    Schatzki's ring    Gastric erosion    Advanced care planning/counseling discussion 04/21/2017   Acute on chronic respiratory failure with hypoxia (HCC) 05/08/2016   COPD exacerbation (HCC) 05/08/2016   Hyperglycemia 05/08/2016   Dyspnea 05/06/2016   Encounter for hepatitis C screening test for low risk patient 03/19/2016   Essential hypertension 03/19/2015   COPD (chronic obstructive pulmonary disease) (HCC) 03/19/2015   Hyperlipidemia 03/19/2015   Osteoarthritis of both hands 03/19/2015   Osteoporosis 03/19/2015   Acid reflux 03/19/2015   Major depression, chronic 09/12/2014   1. Chronic respiratory failure with hypoxia (HCC) She has a steady decline in her status due to her extreme shortness of breath and inability to move around without minimal exertion getting dyspneic I think she needs to have wheelchair - DME Wheelchair manual  2. COPD, frequent exacerbations (HCC) Likely will need to place her on prednisone daily on the go ahead and give her a prescription for 10 mg baseline - predniSONE (DELTASONE) 10 MG tablet; Take 1 tablet (10 mg total) by mouth daily with breakfast.  Dispense: 30 tablet; Refill: 4  3. Bronchiectasis without complication (HCC) Severe disease but no acute exacerbation now  4. Anxiety She has severe anxiety and underlying shortness of breath makes this anxiety worse she does have good response to Xanax -  ALPRAZolam (XANAX) 0.25 MG tablet; Take 0.5 tablets (0.125 mg total) by mouth 2 (two) times daily as needed for anxiety.  Dispense: 20 tablet; Refill: 0   General Counseling: I have discussed the findings of the evaluation and examination with Alvino Chapel.  I have also discussed any further diagnostic evaluation thatmay be needed or ordered today. Tempie verbalizes understanding of the findings of todays visit. We also reviewed her medications today and discussed drug interactions and side effects including but not limited excessive drowsiness and altered mental states. We also discussed that there is always a risk not just to her but also  people around her. she has been encouraged to call the office with any questions or concerns that should arise related to todays visit.  No orders of the defined types were placed in this encounter.    Time spent: 63  I have personally obtained a history, examined the patient, evaluated laboratory and imaging results, formulated the assessment and plan and placed orders.    Yevonne Pax, MD [facility] Pulmonary and Critical Care Sleep medicine

## 2022-08-17 ENCOUNTER — Other Ambulatory Visit: Payer: Self-pay | Admitting: Internal Medicine

## 2022-08-26 ENCOUNTER — Other Ambulatory Visit: Payer: Self-pay | Admitting: Internal Medicine

## 2022-08-26 DIAGNOSIS — J441 Chronic obstructive pulmonary disease with (acute) exacerbation: Secondary | ICD-10-CM

## 2022-11-05 ENCOUNTER — Other Ambulatory Visit: Payer: Self-pay | Admitting: Nurse Practitioner

## 2022-11-29 ENCOUNTER — Other Ambulatory Visit: Payer: Self-pay | Admitting: Internal Medicine

## 2022-12-17 ENCOUNTER — Ambulatory Visit (INDEPENDENT_AMBULATORY_CARE_PROVIDER_SITE_OTHER): Payer: PPO | Admitting: Internal Medicine

## 2022-12-17 ENCOUNTER — Encounter: Payer: Self-pay | Admitting: Internal Medicine

## 2022-12-17 VITALS — BP 140/70 | HR 100 | Temp 98.1°F | Resp 16 | Ht 62.0 in | Wt 135.0 lb

## 2022-12-17 DIAGNOSIS — R7303 Prediabetes: Secondary | ICD-10-CM | POA: Diagnosis not present

## 2022-12-17 DIAGNOSIS — G720 Drug-induced myopathy: Secondary | ICD-10-CM | POA: Diagnosis not present

## 2022-12-17 DIAGNOSIS — J9611 Chronic respiratory failure with hypoxia: Secondary | ICD-10-CM

## 2022-12-17 DIAGNOSIS — E782 Mixed hyperlipidemia: Secondary | ICD-10-CM | POA: Diagnosis not present

## 2022-12-17 DIAGNOSIS — F3341 Major depressive disorder, recurrent, in partial remission: Secondary | ICD-10-CM | POA: Diagnosis not present

## 2022-12-17 DIAGNOSIS — J479 Bronchiectasis, uncomplicated: Secondary | ICD-10-CM

## 2022-12-17 DIAGNOSIS — I10 Essential (primary) hypertension: Secondary | ICD-10-CM | POA: Diagnosis not present

## 2022-12-17 DIAGNOSIS — R0609 Other forms of dyspnea: Secondary | ICD-10-CM | POA: Diagnosis not present

## 2022-12-17 DIAGNOSIS — J441 Chronic obstructive pulmonary disease with (acute) exacerbation: Secondary | ICD-10-CM

## 2022-12-17 DIAGNOSIS — Z9981 Dependence on supplemental oxygen: Secondary | ICD-10-CM

## 2022-12-17 DIAGNOSIS — T466X5A Adverse effect of antihyperlipidemic and antiarteriosclerotic drugs, initial encounter: Secondary | ICD-10-CM | POA: Diagnosis not present

## 2022-12-17 DIAGNOSIS — Z1211 Encounter for screening for malignant neoplasm of colon: Secondary | ICD-10-CM | POA: Diagnosis not present

## 2022-12-17 DIAGNOSIS — J431 Panlobular emphysema: Secondary | ICD-10-CM | POA: Diagnosis not present

## 2022-12-17 DIAGNOSIS — Z0000 Encounter for general adult medical examination without abnormal findings: Secondary | ICD-10-CM | POA: Diagnosis not present

## 2022-12-17 DIAGNOSIS — Z79899 Other long term (current) drug therapy: Secondary | ICD-10-CM | POA: Diagnosis not present

## 2022-12-17 MED ORDER — BREZTRI AEROSPHERE 160-9-4.8 MCG/ACT IN AERO
2.0000 | INHALATION_SPRAY | Freq: Two times a day (BID) | RESPIRATORY_TRACT | 11 refills | Status: DC
Start: 2022-12-17 — End: 2023-04-19

## 2022-12-17 MED ORDER — ALBUTEROL SULFATE HFA 108 (90 BASE) MCG/ACT IN AERS
2.0000 | INHALATION_SPRAY | Freq: Four times a day (QID) | RESPIRATORY_TRACT | 6 refills | Status: DC | PRN
Start: 2022-12-17 — End: 2023-04-19

## 2022-12-17 NOTE — Patient Instructions (Signed)
Chronic Obstructive Pulmonary Disease  Chronic obstructive pulmonary disease (COPD) is a long-term (chronic) lung problem. When you have COPD, it is hard for air to get in and out of your lungs. Usually the condition gets worse over time, and your lungs will never return to normal. There are things you can do to keep yourself as healthy as possible. What are the causes? Smoking. This is the most common cause. Certain genes passed from parent to child (inherited). What increases the risk? Being exposed to secondhand smoke from cigarettes, pipes, or cigars. Being exposed to chemicals and other irritants, such as fumes and dust in the work environment. Having chronic lung conditions or infections. What are the signs or symptoms? Shortness of breath, especially during physical activity. A long-term cough with a large amount of thick mucus. Sometimes, the cough may not have any mucus (dry cough). Wheezing. Breathing quickly. Skin that looks gray or blue, especially in the fingers, toes, or lips. Feeling tired (fatigue). Weight loss. Chest tightness. Having infections often. Episodes when breathing symptoms become much worse (exacerbations). At the later stages of this disease, you may have swelling in the ankles, feet, or legs. How is this treated? Taking medicines. Quitting smoking, if you smoke. Rehabilitation. This includes steps to make your body work better. It may involve a team of specialists. Doing exercises. Making changes to your diet. Using oxygen. Lung surgery. Lung transplant. Comfort measures (palliative care). Follow these instructions at home: Medicines Take over-the-counter and prescription medicines only as told by your doctor. Talk to your doctor before taking any cough or allergy medicines. You may need to avoid medicines that cause your lungs to be dry. Lifestyle If you smoke, stop smoking. Smoking makes the problem worse. Do not smoke or use any products that  contain nicotine or tobacco. If you need help quitting, ask your doctor. Avoid being around things that make your breathing worse. This may include smoke, chemicals, and fumes. Stay active, but remember to rest as well. Learn and use tips on how to manage stress and control your breathing. Make sure you get enough sleep. Most adults need at least 7 hours of sleep every night. Eat healthy foods. Eat smaller meals more often. Rest before meals. Controlled breathing Learn and use tips on how to control your breathing as told by your doctor. Try: Breathing in (inhaling) through your nose for 1 second. Then, pucker your lips and breath out (exhale) through your lips for 2 seconds. Putting one hand on your belly (abdomen). Breathe in slowly through your nose for 1 second. Your hand on your belly should move out. Pucker your lips and breathe out slowly through your lips. Your hand on your belly should move in as you breathe out.  Controlled coughing Learn and use controlled coughing to clear mucus from your lungs. Follow these steps: Lean your head a little forward. Breathe in deeply. Try to hold your breath for 3 seconds. Keep your mouth slightly open while coughing 2 times. Spit any mucus out into a tissue. Rest and do the steps again 1 or 2 times as needed. General instructions Make sure you get all the shots (vaccines) that your doctor recommends. Ask your doctor about a flu shot and a pneumonia shot. Use oxygen therapy and pulmonary rehabilitation if told by your doctor. If you need home oxygen therapy, ask your doctor if you should buy a tool to measure your oxygen level (oximeter). Make a COPD action plan with your doctor. This helps you   to know what to do if you feel worse than usual. Manage any other conditions you have as told by your doctor. Avoid going outside when it is very hot, cold, or humid. Avoid people who have a sickness you can catch (contagious). Keep all follow-up  visits. Contact a doctor if: You cough up more mucus than usual. There is a change in the color or thickness of the mucus. It is harder to breathe than usual. Your breathing is faster than usual. You have trouble sleeping. You need to use your medicines more often than usual. You have trouble doing your normal activities such as getting dressed or walking around the house. Get help right away if: You have shortness of breath while resting. You have shortness of breath that stops you from: Being able to talk. Doing normal activities. Your chest hurts for longer than 5 minutes. Your skin color is more blue than usual. Your pulse oximeter shows that you have low oxygen for longer than 5 minutes. You have a fever. You feel too tired to breathe normally. These symptoms may represent a serious problem that is an emergency. Do not wait to see if the symptoms will go away. Get medical help right away. Call your local emergency services (911 in the [place] Do not drive yourself to the hospital. Summary Chronic obstructive pulmonary disease (COPD) is a long-term lung problem. The way your lungs work will never return to normal. Usually the condition gets worse over time. There are things you can do to keep yourself as healthy as possible. Take over-the-counter and prescription medicines only as told by your doctor. If you smoke, stop. Smoking makes the problem worse. This information is not intended to replace advice given to you by your health care provider. Make sure you discuss any questions you have with your health care provider. Document Revised: 02/05/2020 Document Reviewed: 02/06/2020 Elsevier Patient Education  2024 [employer]  

## 2022-12-17 NOTE — Progress Notes (Signed)
[facility] [address] [place], [place] [address]  Pulmonary Sleep Medicine   Office Visit Note  Patient Name: Norris Hell DOB: [birth year] MRN [id number]  Date of Service: 12/17/2022  Complaints/HPI: She is doing OK. She has been on her oxygen and she states that she has been tolerating it well. The patient had some swelling and was given lasix. She also is going to get an echo per PCP. She has no chest pain. Shortness of breath is about the same. She has no admissions.  She states that she is doing fairly well on current regimen of inhalers oxygen therapy.  Office Spirometry Results:     ROS  General: (-) fever, (-) chills, (-) night sweats, (-) weakness Skin: (-) rashes, (-) itching,. Eyes: (-) visual changes, (-) redness, (-) itching. Nose and Sinuses: (-) nasal stuffiness or itchiness, (-) postnasal drip, (-) nosebleeds, (-) sinus trouble. Mouth and Throat: (-) sore throat, (-) hoarseness. Neck: (-) swollen glands, (-) enlarged thyroid, (-) neck pain. Respiratory: + cough, (-) bloody sputum, + shortness of breath, - wheezing. Cardiovascular: - ankle swelling, (-) chest pain. Lymphatic: (-) lymph node enlargement. Neurologic: (-) numbness, (-) tingling. Psychiatric: (-) anxiety, (-) depression   Current Medication: Outpatient Encounter Medications as of 12/17/2022  Medication Sig   acetaminophen (TYLENOL) 500 MG tablet Take 1,000 mg by mouth every 6 (six) hours as needed for moderate pain.   albuterol (VENTOLIN HFA) 108 (90 Base) MCG/ACT inhaler INHALE 2 PUFFS INTO LUNGS EVERY 6 HOURS AS NEEDED   ALPRAZolam (XANAX) 0.25 MG tablet Take 0.5 tablets (0.125 mg total) by mouth 2 (two) times daily as needed for anxiety.   aspirin EC 325 MG tablet Take 1 tablet (325 mg total) by mouth daily.   Budeson-Glycopyrrol-Formoterol (BREZTRI AEROSPHERE) 160-9-4.8 MCG/ACT AERO Inhale 2 puffs into the lungs 2 (two) times daily.   cholecalciferol (VITAMIN D) 1000 units tablet  Take 1,000 Units by mouth daily.   Docusate Calcium (STOOL SOFTENER PO) Take 2 tablets by mouth daily.   escitalopram (LEXAPRO) 10 MG tablet Take 1 tablet (10 mg total) by mouth daily. (Patient taking differently: Take 10 mg by mouth every morning.)   ezetimibe (ZETIA) 10 MG tablet Take 10 mg by mouth every morning.   fluticasone-salmeterol (WIXELA INHUB) 250-50 MCG/ACT AEPB INHALE 1 PUFF INTO THE LUNGS IN THE MORNING AND AT BEDTIME   Multiple Vitamin (MULTIVITAMIN WITH MINERALS) TABS tablet Take 1 tablet by mouth daily.   omeprazole (PRILOSEC) 20 MG capsule TAKE 1 CAPSULE BY MOUTH DAILY AS NEEDED FOR HEARTBURN   OXYGEN Inhale 2.5-3 L into the lungs at bedtime. 3 L portable   uses [employer] for oxygen   potassium chloride (K-DUR) 10 MEQ tablet Take 1 tablet (10 mEq total) by mouth daily.   predniSONE (DELTASONE) 10 MG tablet Take 1 tablet (10 mg total) by mouth daily with breakfast.   propranolol ER (INDERAL LA) 60 MG 24 hr capsule Take 60 mg by mouth daily.   Roflumilast 250 MCG TABS TAKE 1 TABLET BY MOUTH DAILY   sulfamethoxazole-trimethoprim (BACTRIM DS) 800-160 MG tablet Take 1 tablet by mouth 2 (two) times daily.   theophylline (THEO-24) 100 MG 24 hr capsule Take 1 capsule (100 mg total) by mouth daily.   tiotropium (SPIRIVA) 18 MCG inhalation capsule INHALE CONTENTS OF 1 CAPSULE ONCE DAILY USING HANDIHALER   traMADol (ULTRAM) 50 MG tablet Take 1-2 tablets (50-100 mg total) by mouth every 6 (six) hours as needed for moderate pain.  zinc gluconate 50 MG tablet Take 50 mg by mouth daily.   No facility-administered encounter medications on file as of 12/17/2022.    Surgical History: Past Surgical History:  Procedure Laterality Date   ABDOMINAL HYSTERECTOMY     CHOLECYSTECTOMY     ESOPHAGEAL MANOMETRY N/A 09/13/2017   Procedure: ESOPHAGEAL MANOMETRY (EM);  Surgeon: Napoleon Form, MD;  Location: [facility];  Service: Endoscopy;  Laterality: N/A;    ESOPHAGOGASTRODUODENOSCOPY (EGD) WITH PROPOFOL N/A 05/11/2017   Procedure: ESOPHAGOGASTRODUODENOSCOPY (EGD) WITH PROPOFOL;  Surgeon: Toney Reil, MD;  Location: [facility];  Service: Gastroenterology;  Laterality: N/A;   EYE SURGERY Bilateral    CATARACTS   FOOT SURGERY     MORTONS NEUROMA   LAPAROSCOPIC NISSEN FUNDOPLICATION N/A 10/19/2017   Procedure: LAPAROSCOPIC NISSEN FUNDOPLICATION;  Surgeon: Leafy Ro, MD;  Location: [facility];  Service: General;  Laterality: N/A;   NISSEN FUNDOPLICATION N/A 10/19/2017   Procedure: NISSEN FUNDOPLICATION;  Surgeon: Leafy Ro, MD;  Location: [facility];  Service: General;  Laterality: N/A;   REVERSE SHOULDER ARTHROPLASTY Left 09/10/2020   Procedure: REVERSE SHOULDER ARTHROPLASTY;  Surgeon: Christena Flake, MD;  Location: [facility];  Service: Orthopedics;  Laterality: Left;    Medical History: Past Medical History:  Diagnosis Date   Aortic atherosclerosis (HCC)    Arthritis    Complication of anesthesia    difficulty waking up   COPD (chronic obstructive pulmonary disease) (HCC)    Coronary artery disease    Dyspnea    Emphysema of lung (HCC)    SEVERE   GERD (gastroesophageal reflux disease)    Headache    H/O   History of hiatal hernia    Hyperlipidemia    Hypertension    H/O OFF MEDS SINCE 2020 BY PCP   Osteoporosis    Oxygen deficiency    uses O2 at night   Oxygen dependent    Pneumonia    Umbilical hernia     Family History: Family History  Problem Relation Age of Onset   Ovarian cancer Mother    Emphysema Father    Melanoma Sister    Lung cancer Brother     Social History: Social History   Socioeconomic History   Marital status: Married    Spouse name: Not on file   Number of children: Not on file   Years of education: 12   Highest education level: 12th grade  Occupational History   Occupation: retired  Tobacco Use   Smoking status: Former    Current packs/day: 0.00    Average packs/day: 1.5  packs/day for 30.0 years (45.0 ttl pk-yrs)    Types: Cigarettes    Start date: 03/19/1971    Quit date: 03/18/2001    Years since quitting: 21.7   Smokeless tobacco: Never  Vaping Use   Vaping status: Never Used  Substance and Sexual Activity   Alcohol use: Yes    Comment: rare   Drug use: No   Sexual activity: Not Currently  Other Topics Concern   Not on file  Social History Narrative   Not on file   Social Determinants of Health   Financial Resource Strain: Low Risk  (12/17/2022)   Received from [facility]   Overall Financial Resource Strain (CARDIA)    Difficulty of Paying Living Expenses: Not hard at all  Food Insecurity: No Food Insecurity (12/17/2022)   Received from [facility]   Hunger Vital Sign    Worried  About Running Out of Food in the Last Year: Never true    Ran Out of Food in the Last Year: Never true  Transportation Needs: No Transportation Needs (12/17/2022)   Received from [facility] - Transportation    In the past 12 months, has lack of transportation kept you from medical appointments or from getting medications?: No    Lack of Transportation (Non-Medical): No  Physical Activity: Inactive (04/19/2017)   Exercise Vital Sign    Days of Exercise per Week: 0 days    Minutes of Exercise per Session: 0 min  Stress: No Stress Concern Present (04/19/2017)   [employer] of Occupational Health - Occupational Stress Questionnaire    Feeling of Stress : Not at all  Social Connections: Moderately Integrated (04/19/2017)   Social Connection and Isolation Panel [NHANES]    Frequency of Communication with Friends and Family: More than three times a week    Frequency of Social Gatherings with Friends and Family: More than three times a week    Attends Religious Services: More than 4 times per year    Active Member of [employer] or Organizations: No    Attends Banker Meetings: Never    Marital Status:  Married  Catering manager Violence: Not At Risk (04/19/2017)   Humiliation, Afraid, Rape, and Kick questionnaire    Fear of Current or Ex-Partner: No    Emotionally Abused: No    Physically Abused: No    Sexually Abused: No    Vital Signs: Blood pressure (!) 140/70, pulse 100, temperature 98.1 F (36.7 C), resp. rate 16, height 5\' 2"  (1.575 m), weight 135 lb (61.2 kg), SpO2 94%.  Examination: General Appearance: The patient is well-developed, well-nourished, and in no distress. Skin: Gross inspection of skin unremarkable. Head: normocephalic, no gross deformities. Eyes: no gross deformities noted. ENT: ears appear grossly normal no exudates. Neck: Supple. No thyromegaly. No LAD. Respiratory: few rhonch inoted. Cardiovascular: Normal S1 and S2 without murmur or rub. Extremities: No cyanosis. pulses are equal. Neurologic: Alert and oriented. No involuntary movements.  LABS: No results found for this or any previous visit (from the past 2160 hour(s)).  Radiology: CT Chest High Resolution  Result Date: 12/11/2020 CLINICAL DATA:  77 year old female with history of bronchiolectasis. EXAM: CT CHEST WITHOUT CONTRAST TECHNIQUE: Multidetector CT imaging of the chest was performed following the standard protocol without intravenous contrast. High resolution imaging of the lungs, as well as inspiratory and expiratory imaging, was performed. COMPARISON:  Chest x-ray 06/21/2020. FINDINGS: Cardiovascular: Heart size is normal. There is no significant pericardial fluid, thickening or pericardial calcification. There is aortic atherosclerosis, as well as atherosclerosis of the great vessels of the mediastinum and the coronary arteries, including calcified atherosclerotic plaque in the left main, left anterior descending, left circumflex and right coronary arteries. Mediastinum/Nodes: No pathologically enlarged mediastinal or hilar lymph nodes. Esophagus is unremarkable in appearance. No axillary  lymphadenopathy. Lungs/Pleura: Chronic mass-like area of architectural distortion and volume loss in the medial segment of the right middle lobe, similar to numerous prior examinations dating back to 2011, most compatible with an area of chronic post infectious or inflammatory scarring. Similar smaller lesion is also noted in the posterior aspect of the lingula. No other suspicious appearing pulmonary nodules or masses are noted. Scattered areas of mild cylindrical bronchiectasis and mucoid impaction are noted in the lungs bilaterally. No generalized areas of septal thickening, subpleural reticulation or honeycombing are noted to suggest interstitial lung disease.  Diffuse bronchial wall thickening with severe centrilobular and paraseptal emphysema. No acute consolidative airspace disease. No pleural effusions. Upper Abdomen: Aortic atherosclerosis.  Status post cholecystectomy. Musculoskeletal: There are no aggressive appearing lytic or blastic lesions noted in the visualized portions of the skeleton. Status post left shoulder arthroplasty. IMPRESSION: 1. No findings to suggest interstitial lung disease. 2. Scattered areas of bronchiectasis and post infectious or inflammatory scarring, similar to prior studies, as above. 3. Diffuse bronchial wall thickening with severe centrilobular and paraseptal emphysema; imaging findings compatible with reported clinical history of COPD. 4. Aortic atherosclerosis, in addition to left main and 3 vessel coronary artery disease. Assessment for potential risk factor modification, dietary therapy or pharmacologic therapy may be warranted, if clinically indicated. Aortic Atherosclerosis (ICD10-I70.0) and Emphysema (ICD10-J43.9). Electronically Signed   By: Trudie Reed M.D.   On: 12/11/2020 14:52    No results found.  No results found.  Assessment and Plan: Patient Active Problem List   Diagnosis Date Noted   Status post reverse arthroplasty of shoulder, left 09/10/2020    Primary osteoarthritis of left shoulder 12/04/2019   Primary osteoarthritis of right shoulder 12/04/2019   Rotator cuff tendinitis, left 12/04/2019   Statin myopathy 11/02/2019   Umbilical hernia with obstruction, without gangrene 05/31/2019   Panlobular emphysema (HCC) 05/08/2019   Recurrent major depressive disorder, in partial remission (HCC) 05/08/2019   Obstructive chronic bronchitis with exacerbation (HCC) 01/30/2019   Oxygen dependent 01/30/2019   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) 10/19/2017   CAD (coronary artery disease), native coronary artery 10/18/2017   Hiatal hernia 10/15/2017   Abnormal EKG 10/15/2017   Dysphagia    Schatzki's ring    Gastric erosion    Advanced care planning/counseling discussion 04/21/2017   Acute on chronic respiratory failure with hypoxia (HCC) 05/08/2016   COPD exacerbation (HCC) 05/08/2016   Hyperglycemia 05/08/2016   Dyspnea 05/06/2016   Encounter for hepatitis C screening test for low risk patient 03/19/2016   Essential hypertension 03/19/2015   COPD (chronic obstructive pulmonary disease) (HCC) 03/19/2015   Hyperlipidemia 03/19/2015   Osteoarthritis of both hands 03/19/2015   Osteoporosis 03/19/2015   Acid reflux 03/19/2015   Major depression, chronic 09/12/2014    1. Chronic respiratory failure with hypoxia (HCC) On oxygen therapy will continue with the current flow rate patient appears to be tolerating it well.  2. COPD, frequent exacerbations (HCC) She is actually done quite well staying out of the hospital and we will continue with maintenance treatment  3. Bronchiectasis without complication (HCC) No acute flareups are noted lately will continue to monitor along closely.  4. Oxygen dependent On oxygen therapy secondary to chronic respiratory failure.  General Counseling: I have discussed the findings of the evaluation and examination with Alvino Chapel.  I have also discussed any further diagnostic  evaluation thatmay be needed or ordered today. Kerstin verbalizes understanding of the findings of todays visit. We also reviewed her medications today and discussed drug interactions and side effects including but not limited excessive drowsiness and altered mental states. We also discussed that there is always a risk not just to her but also people around her. she has been encouraged to call the office with any questions or concerns that should arise related to todays visit.  No orders of the defined types were placed in this encounter.    Time spent: 81  I have personally obtained a history, examined the patient, evaluated laboratory and imaging results, formulated the assessment and plan and placed  orders.    Yevonne Pax, MD [facility] Pulmonary and Critical Care Sleep medicine

## 2022-12-31 ENCOUNTER — Other Ambulatory Visit: Payer: Self-pay | Admitting: Internal Medicine

## 2022-12-31 DIAGNOSIS — J441 Chronic obstructive pulmonary disease with (acute) exacerbation: Secondary | ICD-10-CM

## 2023-01-10 ENCOUNTER — Other Ambulatory Visit: Payer: Self-pay | Admitting: Internal Medicine

## 2023-01-10 DIAGNOSIS — J441 Chronic obstructive pulmonary disease with (acute) exacerbation: Secondary | ICD-10-CM

## 2023-01-11 ENCOUNTER — Other Ambulatory Visit: Payer: Self-pay

## 2023-01-11 MED ORDER — OMEPRAZOLE 20 MG PO CPDR: DELAYED_RELEASE_CAPSULE | ORAL | 1 refills | Status: DC

## 2023-01-15 ENCOUNTER — Other Ambulatory Visit: Payer: Self-pay | Admitting: Internal Medicine

## 2023-01-15 DIAGNOSIS — J441 Chronic obstructive pulmonary disease with (acute) exacerbation: Secondary | ICD-10-CM

## 2023-02-01 DIAGNOSIS — R079 Chest pain, unspecified: Secondary | ICD-10-CM | POA: Diagnosis not present

## 2023-02-01 DIAGNOSIS — R0602 Shortness of breath: Secondary | ICD-10-CM | POA: Diagnosis not present

## 2023-02-01 DIAGNOSIS — J9811 Atelectasis: Secondary | ICD-10-CM | POA: Diagnosis not present

## 2023-02-01 DIAGNOSIS — I10 Essential (primary) hypertension: Secondary | ICD-10-CM | POA: Diagnosis not present

## 2023-02-01 DIAGNOSIS — J431 Panlobular emphysema: Secondary | ICD-10-CM | POA: Diagnosis not present

## 2023-02-01 DIAGNOSIS — J984 Other disorders of lung: Secondary | ICD-10-CM | POA: Diagnosis not present

## 2023-02-01 DIAGNOSIS — R000 Tachycardia, unspecified: Secondary | ICD-10-CM | POA: Diagnosis not present

## 2023-02-20 ENCOUNTER — Other Ambulatory Visit: Payer: Self-pay | Admitting: Internal Medicine

## 2023-02-20 DIAGNOSIS — J441 Chronic obstructive pulmonary disease with (acute) exacerbation: Secondary | ICD-10-CM

## 2023-02-25 ENCOUNTER — Other Ambulatory Visit
Admission: RE | Admit: 2023-02-25 | Discharge: 2023-02-25 | Disposition: A | Discharge status: Home / Self Care | Payer: PPO | Source: Ambulatory Visit | Attending: Physician Assistant | Admitting: Physician Assistant

## 2023-02-25 ENCOUNTER — Other Ambulatory Visit: Payer: Self-pay | Admitting: Internal Medicine

## 2023-02-25 DIAGNOSIS — I10 Essential (primary) hypertension: Secondary | ICD-10-CM | POA: Diagnosis not present

## 2023-02-25 DIAGNOSIS — R0602 Shortness of breath: Secondary | ICD-10-CM | POA: Insufficient documentation

## 2023-02-25 DIAGNOSIS — R000 Tachycardia, unspecified: Secondary | ICD-10-CM | POA: Diagnosis not present

## 2023-02-25 DIAGNOSIS — J441 Chronic obstructive pulmonary disease with (acute) exacerbation: Secondary | ICD-10-CM

## 2023-02-25 DIAGNOSIS — R600 Localized edema: Secondary | ICD-10-CM | POA: Diagnosis not present

## 2023-02-25 DIAGNOSIS — E782 Mixed hyperlipidemia: Secondary | ICD-10-CM | POA: Diagnosis not present

## 2023-02-25 LAB — BRAIN NATRIURETIC PEPTIDE: B Natriuretic Peptide: 86.8 pg/mL (ref 0.0–100.0)

## 2023-02-26 ENCOUNTER — Telehealth: Payer: Self-pay

## 2023-02-26 ENCOUNTER — Other Ambulatory Visit: Payer: Self-pay

## 2023-02-26 DIAGNOSIS — J441 Chronic obstructive pulmonary disease with (acute) exacerbation: Secondary | ICD-10-CM

## 2023-02-26 MED ORDER — ROFLUMILAST 250 MCG PO TABS: 1.0000 | ORAL_TABLET | Freq: Every day | ORAL | 1 refills | Status: DC

## 2023-02-26 MED ORDER — THEOPHYLLINE ER 100 MG PO CP24: 100.0000 mg | ORAL_CAPSULE | Freq: Every day | ORAL | 4 refills | Status: DC

## 2023-02-26 MED ORDER — THEOPHYLLINE ER 100 MG PO CP24: 100.0000 mg | ORAL_CAPSULE | Freq: Every day | ORAL | 1 refills | Status: DC

## 2023-02-26 NOTE — Telephone Encounter (Signed)
error 

## 2023-03-03 DIAGNOSIS — R0602 Shortness of breath: Secondary | ICD-10-CM | POA: Diagnosis not present

## 2023-03-03 DIAGNOSIS — R000 Tachycardia, unspecified: Secondary | ICD-10-CM | POA: Diagnosis not present

## 2023-03-19 DIAGNOSIS — G720 Drug-induced myopathy: Secondary | ICD-10-CM | POA: Diagnosis not present

## 2023-03-19 DIAGNOSIS — T466X5A Adverse effect of antihyperlipidemic and antiarteriosclerotic drugs, initial encounter: Secondary | ICD-10-CM | POA: Diagnosis not present

## 2023-03-19 DIAGNOSIS — E782 Mixed hyperlipidemia: Secondary | ICD-10-CM | POA: Diagnosis not present

## 2023-03-19 DIAGNOSIS — Z79899 Other long term (current) drug therapy: Secondary | ICD-10-CM | POA: Diagnosis not present

## 2023-03-19 DIAGNOSIS — R7303 Prediabetes: Secondary | ICD-10-CM | POA: Diagnosis not present

## 2023-03-19 DIAGNOSIS — F3341 Major depressive disorder, recurrent, in partial remission: Secondary | ICD-10-CM | POA: Diagnosis not present

## 2023-03-19 DIAGNOSIS — J431 Panlobular emphysema: Secondary | ICD-10-CM | POA: Diagnosis not present

## 2023-03-19 DIAGNOSIS — I10 Essential (primary) hypertension: Secondary | ICD-10-CM | POA: Diagnosis not present

## 2023-03-24 DIAGNOSIS — J449 Chronic obstructive pulmonary disease, unspecified: Secondary | ICD-10-CM | POA: Diagnosis not present

## 2023-03-31 DIAGNOSIS — R000 Tachycardia, unspecified: Secondary | ICD-10-CM | POA: Diagnosis not present

## 2023-04-12 DIAGNOSIS — R000 Tachycardia, unspecified: Secondary | ICD-10-CM | POA: Diagnosis not present

## 2023-04-19 ENCOUNTER — Encounter: Payer: Self-pay | Admitting: Internal Medicine

## 2023-04-19 ENCOUNTER — Telehealth: Payer: Self-pay

## 2023-04-19 ENCOUNTER — Ambulatory Visit: Payer: PPO | Admitting: Internal Medicine

## 2023-04-19 VITALS — BP 130/90 | HR 92 | Temp 98.2°F | Resp 16 | Ht 62.0 in | Wt 138.6 lb

## 2023-04-19 DIAGNOSIS — Z9981 Dependence on supplemental oxygen: Secondary | ICD-10-CM | POA: Diagnosis not present

## 2023-04-19 DIAGNOSIS — J441 Chronic obstructive pulmonary disease with (acute) exacerbation: Secondary | ICD-10-CM | POA: Diagnosis not present

## 2023-04-19 DIAGNOSIS — J9611 Chronic respiratory failure with hypoxia: Secondary | ICD-10-CM | POA: Diagnosis not present

## 2023-04-19 DIAGNOSIS — J479 Bronchiectasis, uncomplicated: Secondary | ICD-10-CM

## 2023-04-19 MED ORDER — BREZTRI AEROSPHERE 160-9-4.8 MCG/ACT IN AERO: 2.0000 | INHALATION_SPRAY | Freq: Two times a day (BID) | RESPIRATORY_TRACT | 6 refills | Status: DC

## 2023-04-19 MED ORDER — PREDNISONE 10 MG PO TABS: 10.0000 mg | ORAL_TABLET | Freq: Every day | ORAL | 5 refills | Status: DC

## 2023-04-19 MED ORDER — ALBUTEROL SULFATE HFA 108 (90 BASE) MCG/ACT IN AERS: 2.0000 | INHALATION_SPRAY | RESPIRATORY_TRACT | 6 refills | Status: AC | PRN

## 2023-04-19 NOTE — Progress Notes (Signed)
 [facility] [address] [place], [place] [address]  Pulmonary Sleep Medicine   Office Visit Note  Patient Name: Joyce Rodriguez DOB: 11/02/1945 MRN [id number]  Date of Service: 04/19/2023  Complaints/HPI: Notes breathing is gradually getting worse. She is on her oxygen . She feels that her portable oxygen  had gone bad and the replacement had been a non-contiuous flow and needs to be on coninuous flow. She has cough and congestion but is her usual. She is on daily prednisone   Office Spirometry Results:     ROS  General: (-) fever, (-) chills, (-) night sweats, (-) weakness Skin: (-) rashes, (-) itching,. Eyes: (-) visual changes, (-) redness, (-) itching. Nose and Sinuses: (-) nasal stuffiness or itchiness, (-) postnasal drip, (-) nosebleeds, (-) sinus trouble. Mouth and Throat: (-) sore throat, (-) hoarseness. Neck: (-) swollen glands, (-) enlarged thyroid , (-) neck pain. Respiratory: + cough, (-) bloody sputum, + shortness of breath, + wheezing. Cardiovascular: - ankle swelling, (-) chest pain. Lymphatic: (-) lymph node enlargement. Neurologic: (-) numbness, (-) tingling. Psychiatric: (-) anxiety, (-) depression   Current Medication: Outpatient Encounter Medications as of 04/19/2023  Medication Sig   acetaminophen  (TYLENOL ) 500 MG tablet Take 1,000 mg by mouth every 6 (six) hours as needed for moderate pain.   albuterol  (VENTOLIN  HFA) 108 (90 Base) MCG/ACT inhaler Inhale 2 puffs into the lungs every 6 (six) hours as needed for wheezing or shortness of breath.   ALPRAZolam  (XANAX ) 0.25 MG tablet Take 0.5 tablets (0.125 mg total) by mouth 2 (two) times daily as needed for anxiety.   aspirin  EC 325 MG tablet Take 1 tablet (325 mg total) by mouth daily.   Budeson-Glycopyrrol-Formoterol  (BREZTRI  AEROSPHERE) 160-9-4.8 MCG/ACT AERO Inhale 2 puffs into the lungs 2 (two) times daily.   cholecalciferol  (VITAMIN D ) 1000 units tablet Take 1,000 Units by mouth daily.   Docusate  Calcium  (STOOL SOFTENER PO) Take 2 tablets by mouth daily.   escitalopram  (LEXAPRO ) 10 MG tablet Take 1 tablet (10 mg total) by mouth daily. (Patient taking differently: Take 10 mg by mouth every morning.)   ezetimibe  (ZETIA ) 10 MG tablet Take 10 mg by mouth every morning.   fluticasone -salmeterol (WIXELA INHUB) 250-50 MCG/ACT AEPB INHALE 1 PUFF INTO THE LUNGS IN THE MORNING AND AT BEDTIME   Multiple Vitamin (MULTIVITAMIN WITH MINERALS) TABS tablet Take 1 tablet by mouth daily.   omeprazole  (PRILOSEC) 20 MG capsule TAKE 1 CAPSULE BY MOUTH DAILY AS NEEDED FOR HEARTBURN   OXYGEN  Inhale 2.5-3 L into the lungs at bedtime. 3 L portable   uses [employer] for oxygen    potassium chloride  (K-DUR) 10 MEQ tablet Take 1 tablet (10 mEq total) by mouth daily.   predniSONE  (DELTASONE ) 10 MG tablet TAKE 1 TABLET BY MOUTH DAILY WITH BREAKFAST   propranolol ER (INDERAL LA) 60 MG 24 hr capsule Take 60 mg by mouth daily.   Roflumilast  250 MCG TABS Take 1 tablet by mouth daily.   sulfamethoxazole -trimethoprim  (BACTRIM  DS) 800-160 MG tablet Take 1 tablet by mouth 2 (two) times daily.   theophylline  (THEO-24) 100 MG 24 hr capsule Take 1 capsule (100 mg total) by mouth daily. Take 1 capsule by mouth daily.   tiotropium (SPIRIVA ) 18 MCG inhalation capsule INHALE CONTENTS OF 1 CAPSULE ONCE DAILY USING HANDIHALER   traMADol  (ULTRAM ) 50 MG tablet Take 1-2 tablets (50-100 mg total) by mouth every 6 (six) hours as needed for moderate pain.   zinc  gluconate 50 MG tablet Take 50 mg by  mouth daily.   No facility-administered encounter medications on file as of 04/19/2023.    Surgical History: Past Surgical History:  Procedure Laterality Date   ABDOMINAL HYSTERECTOMY     CHOLECYSTECTOMY     ESOPHAGEAL MANOMETRY N/A 09/13/2017   Procedure: ESOPHAGEAL MANOMETRY (EM);  Surgeon: Shila Gustav GAILS, MD;  Location: [facility];  Service: Endoscopy;  Laterality: N/A;   ESOPHAGOGASTRODUODENOSCOPY (EGD) WITH PROPOFOL  N/A  05/11/2017   Procedure: ESOPHAGOGASTRODUODENOSCOPY (EGD) WITH PROPOFOL ;  Surgeon: Unk Corinn Skiff, MD;  Location: [facility];  Service: Gastroenterology;  Laterality: N/A;   EYE SURGERY Bilateral    CATARACTS   FOOT SURGERY     MORTONS NEUROMA   LAPAROSCOPIC NISSEN FUNDOPLICATION N/A 10/19/2017   Procedure: LAPAROSCOPIC NISSEN FUNDOPLICATION;  Surgeon: Jordis Laneta FALCON, MD;  Location: [facility] ORS;  Service: General;  Laterality: N/A;   NISSEN FUNDOPLICATION N/A 10/19/2017   Procedure: NISSEN FUNDOPLICATION;  Surgeon: Jordis Laneta FALCON, MD;  Location: [facility] ORS;  Service: General;  Laterality: N/A;   REVERSE SHOULDER ARTHROPLASTY Left 09/10/2020   Procedure: REVERSE SHOULDER ARTHROPLASTY;  Surgeon: Edie Norleen PARAS, MD;  Location: [facility] ORS;  Service: Orthopedics;  Laterality: Left;    Medical History: Past Medical History:  Diagnosis Date   Aortic atherosclerosis (HCC)    Arthritis    Complication of anesthesia    difficulty waking up   COPD (chronic obstructive pulmonary disease) (HCC)    Coronary artery disease    Dyspnea    Emphysema of lung (HCC)    SEVERE   GERD (gastroesophageal reflux disease)    Headache    H/O   History of hiatal hernia    Hyperlipidemia    Hypertension    H/O OFF MEDS SINCE 2020 BY PCP   Osteoporosis    Oxygen  deficiency    uses O2 at night   Oxygen  dependent    Pneumonia    Umbilical hernia     Family History: Family History  Problem Relation Age of Onset   Ovarian cancer Mother    Emphysema Father    Melanoma Sister    Lung cancer Brother     Social History: Social History   Socioeconomic History   Marital status: Married    Spouse name: Not on file   Number of children: Not on file   Years of education: 12   Highest education level: 12th grade  Occupational History   Occupation: retired  Tobacco Use   Smoking status: Former    Current packs/day: 0.00    Average packs/day: 1.5 packs/day for 30.0 years (45.0 ttl pk-yrs)    Types:  Cigarettes    Start date: 03/19/1971    Quit date: 03/18/2001    Years since quitting: 22.1   Smokeless tobacco: Never  Vaping Use   Vaping status: Never Used  Substance and Sexual Activity   Alcohol  use: Yes    Comment: rare   Drug use: No   Sexual activity: Not Currently  Other Topics Concern   Not on file  Social History Narrative   Not on file   Social Drivers of Health   Financial Resource Strain: Low Risk  (12/17/2022)   Received from [facility]   Overall Financial Resource Strain (CARDIA)    Difficulty of Paying Living Expenses: Not hard at all  Food Insecurity: No Food Insecurity (12/17/2022)   Received from [facility]   Hunger Vital Sign    Worried About Running Out of Food in the Last Year:  Never true    Ran Out of Food in the Last Year: Never true  Transportation Needs: No Transportation Needs (12/17/2022)   Received from [facility] - Transportation    In the past 12 months, has lack of transportation kept you from medical appointments or from getting medications?: No    Lack of Transportation (Non-Medical): No  Physical Activity: Inactive (04/19/2017)   Exercise Vital Sign    Days of Exercise per Week: 0 days    Minutes of Exercise per Session: 0 min  Stress: No Stress Concern Present (04/19/2017)   [employer] of Occupational Health - Occupational Stress Questionnaire    Feeling of Stress : Not at all  Social Connections: Moderately Integrated (04/19/2017)   Social Connection and Isolation Panel [NHANES]    Frequency of Communication with Friends and Family: More than three times a week    Frequency of Social Gatherings with Friends and Family: More than three times a week    Attends Religious Services: More than 4 times per year    Active Member of [employer] or Organizations: No    Attends Banker Meetings: Never    Marital Status: Married  Catering Manager Violence: Not At Risk  (04/19/2017)   Humiliation, Afraid, Rape, and Kick questionnaire    Fear of Current or Ex-Partner: No    Emotionally Abused: No    Physically Abused: No    Sexually Abused: No    Vital Signs: Blood pressure (!) 130/90, pulse 92, temperature 98.2 F (36.8 C), resp. rate 16, height 5' 2 (1.575 m), weight 138 lb 9.6 oz (62.9 kg), SpO2 93%.  Examination: General Appearance: The patient is well-developed, well-nourished, and in no distress. Skin: Gross inspection of skin unremarkable. Head: normocephalic, no gross deformities. Eyes: no gross deformities noted. ENT: ears appear grossly normal no exudates. Neck: Supple. No thyromegaly. No LAD. Respiratory: few rhonchi noted. Cardiovascular: Normal S1 and S2 without murmur or rub. Extremities: No cyanosis. pulses are equal. Neurologic: Alert and oriented. No involuntary movements.  LABS: Recent Results (from the past 2160 hours)  Brain natriuretic peptide     Status: None   Collection Time: 02/25/23 12:00 PM  Result Value Ref Range   B Natriuretic Peptide 86.8 0.0 - 100.0 pg/mL    Comment: Performed at [facility], [address], [place], [place] [address]    Radiology: No results found.  No results found.  No results found.  Assessment and Plan: Patient Active Problem List   Diagnosis Date Noted   Status post reverse arthroplasty of shoulder, left 09/10/2020   Primary osteoarthritis of left shoulder 12/04/2019   Primary osteoarthritis of right shoulder 12/04/2019   Rotator cuff tendinitis, left 12/04/2019   Statin myopathy 11/02/2019   Umbilical hernia with obstruction, without gangrene 05/31/2019   Panlobular emphysema (HCC) 05/08/2019   Recurrent major depressive disorder, in partial remission (HCC) 05/08/2019   Obstructive chronic bronchitis with exacerbation (HCC) 01/30/2019   Oxygen  dependent 01/30/2019   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) 10/19/2017   CAD (coronary artery  disease), native coronary artery 10/18/2017   Hiatal hernia 10/15/2017   Abnormal EKG 10/15/2017   Dysphagia    Schatzki's ring    Gastric erosion    Advanced care planning/counseling discussion 04/21/2017   Acute on chronic respiratory failure with hypoxia (HCC) 05/08/2016   COPD exacerbation (HCC) 05/08/2016   Hyperglycemia 05/08/2016   Dyspnea 05/06/2016   Encounter for hepatitis C screening  test for low risk patient 03/19/2016   Essential hypertension 03/19/2015   COPD (chronic obstructive pulmonary disease) (HCC) 03/19/2015   Hyperlipidemia 03/19/2015   Osteoarthritis of both hands 03/19/2015   Osteoporosis 03/19/2015   Acid reflux 03/19/2015   Major depression, chronic 09/12/2014    1. COPD, frequent exacerbations (HCC) (Primary)  She has had a steady decline we are aware and so was the patient that time she is having steady decline will continue her with the current management steroids oxygen  therapy  2. Chronic respiratory failure with hypoxia (HCC)  Continue with oxygen  therapy and monitor saturations at home  3. Oxygen  dependent  Recall of 5 for oxygen  - For home use only DME oxygen  - 6 minute walk; Future  4. Bronchiectasis without complication (HCC)  She has not had any recent flares so will continue to monitor her.  She does not have any increase in her phlegm   General Counseling: I have discussed the findings of the evaluation and examination with Leeroy.  I have also discussed any further diagnostic evaluation thatmay be needed or ordered today. Marshelle verbalizes understanding of the findings of todays visit. We also reviewed her medications today and discussed drug interactions and side effects including but not limited excessive drowsiness and altered mental states. We also discussed that there is always a risk not just to her but also people around her. she has been encouraged to call the office with any questions or concerns that should arise related to todays  visit.  No orders of the defined types were placed in this encounter.    Time spent: 75  I have personally obtained a history, examined the patient, evaluated laboratory and imaging results, formulated the assessment and plan and placed orders.    Elfreda DELENA Bathe, MD [facility] Pulmonary and Critical Care Sleep medicine

## 2023-04-19 NOTE — Telephone Encounter (Addendum)
 Sent message to [facility] for patient's O2 re-certification. [facility] confirmed O2 delivery.

## 2023-04-23 DIAGNOSIS — J449 Chronic obstructive pulmonary disease, unspecified: Secondary | ICD-10-CM | POA: Diagnosis not present

## 2023-04-24 DIAGNOSIS — J449 Chronic obstructive pulmonary disease, unspecified: Secondary | ICD-10-CM | POA: Diagnosis not present

## 2023-05-24 DIAGNOSIS — J449 Chronic obstructive pulmonary disease, unspecified: Secondary | ICD-10-CM | POA: Diagnosis not present

## 2023-05-25 DIAGNOSIS — J449 Chronic obstructive pulmonary disease, unspecified: Secondary | ICD-10-CM | POA: Diagnosis not present

## 2023-06-11 ENCOUNTER — Telehealth: Payer: PPO | Admitting: Physician Assistant

## 2023-06-11 VITALS — Ht 62.0 in | Wt 138.0 lb

## 2023-06-11 DIAGNOSIS — Z9981 Dependence on supplemental oxygen: Secondary | ICD-10-CM

## 2023-06-11 DIAGNOSIS — J441 Chronic obstructive pulmonary disease with (acute) exacerbation: Secondary | ICD-10-CM | POA: Diagnosis not present

## 2023-06-11 MED ORDER — AZITHROMYCIN 250 MG PO TABS: ORAL_TABLET | ORAL | 0 refills | Status: AC

## 2023-06-11 NOTE — Progress Notes (Signed)
 [facility] [address] [place], [place] [address]  Internal MEDICINE  Telephone Visit  Patient Name: Joyce Rodriguez  [id number]  [id number]  Date of Service: 06/11/2023  I connected with the patient at 8:38 by telephone and verified the patients identity using two identifiers.   I discussed the limitations, risks, security and privacy concerns of performing an evaluation and management service by telephone and the availability of in person appointments. I also discussed with the patient that there may be a patient responsible charge related to the service.  The patient expressed understanding and agrees to proceed.    Chief Complaint  Patient presents with   Telephone Screen    [id number]   Telephone Assessment    Telephone call   Sinusitis   Cough    Clear mucus and breathing is good ,no wheezing     HPI Pt is here for virtual sick visit -She has been having recent sinus and chest Congestion with mucus production, states she thinks recent allergies contributing. No wheezing -She has COPD with hx of frequent exacerbations and hypoxia. She is using her oxygen and medications as prescribed -She is on daily prednisone -She did take mucinex this morning, does have nasal spray as well but unsure if it helps -she states she is prone to developing PNA and wants to be proactive before symptoms progress  Current Medication: Outpatient Encounter Medications as of 06/11/2023  Medication Sig   acetaminophen (TYLENOL) 500 MG tablet Take 1,000 mg by mouth every 6 (six) hours as needed for moderate pain.   albuterol (VENTOLIN HFA) 108 (90 Base) MCG/ACT inhaler Inhale 2 puffs into the lungs every 4 (four) hours as needed for wheezing or shortness of breath.   ALPRAZolam (XANAX) 0.25 MG tablet Take 0.5 tablets (0.125 mg total) by mouth 2 (two) times daily as needed for anxiety.   aspirin EC 325 MG tablet Take 1 tablet (325 mg total) by mouth daily.   azithromycin (ZITHROMAX) 250 MG  tablet Take 2 tablets on day 1, then 1 tablet daily on days 2 through 5   Budeson-Glycopyrrol-Formoterol (BREZTRI AEROSPHERE) 160-9-4.8 MCG/ACT AERO Inhale 2 puffs into the lungs 2 (two) times daily.   cholecalciferol (VITAMIN D) 1000 units tablet Take 1,000 Units by mouth daily.   Docusate Calcium (STOOL SOFTENER PO) Take 2 tablets by mouth daily.   escitalopram (LEXAPRO) 10 MG tablet Take 1 tablet (10 mg total) by mouth daily. (Patient taking differently: Take 10 mg by mouth every morning.)   ezetimibe (ZETIA) 10 MG tablet Take 10 mg by mouth every morning.   fluticasone-salmeterol (WIXELA INHUB) 250-50 MCG/ACT AEPB INHALE 1 PUFF INTO THE LUNGS IN THE MORNING AND AT BEDTIME   Multiple Vitamin (MULTIVITAMIN WITH MINERALS) TABS tablet Take 1 tablet by mouth daily.   omeprazole (PRILOSEC) 20 MG capsule TAKE 1 CAPSULE BY MOUTH DAILY AS NEEDED FOR HEARTBURN   OXYGEN Inhale 2.5-3 L into the lungs at bedtime. 3 L portable   uses [employer] for oxygen   potassium chloride (K-DUR) 10 MEQ tablet Take 1 tablet (10 mEq total) by mouth daily.   predniSONE (DELTASONE) 10 MG tablet Take 1 tablet (10 mg total) by mouth daily with breakfast.   propranolol ER (INDERAL LA) 60 MG 24 hr capsule Take 60 mg by mouth daily.   Roflumilast 250 MCG TABS Take 1 tablet by mouth daily.   sulfamethoxazole-trimethoprim (BACTRIM DS) 800-160 MG tablet Take 1 tablet by mouth 2 (two) times daily.   theophylline (  THEO-24) 100 MG 24 hr capsule Take 1 capsule (100 mg total) by mouth daily. Take 1 capsule by mouth daily.   tiotropium (SPIRIVA) 18 MCG inhalation capsule INHALE CONTENTS OF 1 CAPSULE ONCE DAILY USING HANDIHALER   traMADol (ULTRAM) 50 MG tablet Take 1-2 tablets (50-100 mg total) by mouth every 6 (six) hours as needed for moderate pain.   zinc gluconate 50 MG tablet Take 50 mg by mouth daily.   No facility-administered encounter medications on file as of 06/11/2023.    Surgical History: Past Surgical  History:  Procedure Laterality Date   ABDOMINAL HYSTERECTOMY     CHOLECYSTECTOMY     ESOPHAGEAL MANOMETRY N/A 09/13/2017   Procedure: ESOPHAGEAL MANOMETRY (EM);  Surgeon: Napoleon Form, MD;  Location: [facility];  Service: Endoscopy;  Laterality: N/A;   ESOPHAGOGASTRODUODENOSCOPY (EGD) WITH PROPOFOL N/A 05/11/2017   Procedure: ESOPHAGOGASTRODUODENOSCOPY (EGD) WITH PROPOFOL;  Surgeon: Toney Reil, MD;  Location: [facility] ENDOSCOPY;  Service: Gastroenterology;  Laterality: N/A;   EYE SURGERY Bilateral    CATARACTS   FOOT SURGERY     MORTONS NEUROMA   LAPAROSCOPIC NISSEN FUNDOPLICATION N/A 10/19/2017   Procedure: LAPAROSCOPIC NISSEN FUNDOPLICATION;  Surgeon: Leafy Ro, MD;  Location: [facility] ORS;  Service: General;  Laterality: N/A;   NISSEN FUNDOPLICATION N/A 10/19/2017   Procedure: NISSEN FUNDOPLICATION;  Surgeon: Leafy Ro, MD;  Location: [facility] ORS;  Service: General;  Laterality: N/A;   REVERSE SHOULDER ARTHROPLASTY Left 09/10/2020   Procedure: REVERSE SHOULDER ARTHROPLASTY;  Surgeon: Christena Flake, MD;  Location: [facility] ORS;  Service: Orthopedics;  Laterality: Left;    Medical History: Past Medical History:  Diagnosis Date   Aortic atherosclerosis (HCC)    Arthritis    Complication of anesthesia    difficulty waking up   COPD (chronic obstructive pulmonary disease) (HCC)    Coronary artery disease    Dyspnea    Emphysema of lung (HCC)    SEVERE   GERD (gastroesophageal reflux disease)    Headache    H/O   History of hiatal hernia    Hyperlipidemia    Hypertension    H/O OFF MEDS SINCE 2020 BY PCP   Osteoporosis    Oxygen deficiency    uses O2 at night   Oxygen dependent    Pneumonia    Umbilical hernia     Family History: Family History  Problem Relation Age of Onset   Ovarian cancer Mother    Emphysema Father    Melanoma Sister    Lung cancer Brother     Social History   Socioeconomic History   Marital status: Married    Spouse name: Not on file    Number of children: Not on file   Years of education: 12   Highest education level: 12th grade  Occupational History   Occupation: retired  Tobacco Use   Smoking status: Former    Current packs/day: 0.00    Average packs/day: 1.5 packs/day for 30.0 years (45.0 ttl pk-yrs)    Types: Cigarettes    Start date: 03/19/1971    Quit date: 03/18/2001    Years since quitting: 22.2   Smokeless tobacco: Never  Vaping Use   Vaping status: Never Used  Substance and Sexual Activity   Alcohol use: Yes    Comment: rare   Drug use: No   Sexual activity: Not Currently  Other Topics Concern   Not on file  Social History Narrative   Not on file   Social Drivers  of Health   Financial Resource Strain: Low Risk  (12/17/2022)   Received from [facility]   Overall Financial Resource Strain (CARDIA)    Difficulty of Paying Living Expenses: Not hard at all  Food Insecurity: No Food Insecurity (12/17/2022)   Received from [facility]   Hunger Vital Sign    Worried About Running Out of Food in the Last Year: Never true    Ran Out of Food in the Last Year: Never true  Transportation Needs: No Transportation Needs (12/17/2022)   Received from [facility] - Transportation    In the past 12 months, has lack of transportation kept you from medical appointments or from getting medications?: No    Lack of Transportation (Non-Medical): No  Physical Activity: Inactive (04/19/2017)   Exercise Vital Sign    Days of Exercise per Week: 0 days    Minutes of Exercise per Session: 0 min  Stress: No Stress Concern Present (04/19/2017)   [employer] of Occupational Health - Occupational Stress Questionnaire    Feeling of Stress : Not at all  Social Connections: Moderately Integrated (04/19/2017)   Social Connection and Isolation Panel [NHANES]    Frequency of Communication with Friends and Family: More than three times a week    Frequency of Social  Gatherings with Friends and Family: More than three times a week    Attends Religious Services: More than 4 times per year    Active Member of [employer] or Organizations: No    Attends Banker Meetings: Never    Marital Status: Married  Catering manager Violence: Not At Risk (04/19/2017)   Humiliation, Afraid, Rape, and Kick questionnaire    Fear of Current or Ex-Partner: No    Emotionally Abused: No    Physically Abused: No    Sexually Abused: No      Review of Systems  Constitutional:  Negative for fever.  HENT:  Positive for congestion and postnasal drip. Negative for mouth sores.   Respiratory:  Positive for cough. Negative for wheezing.   Cardiovascular:  Negative for chest pain.  Genitourinary:  Negative for flank pain.  Psychiatric/Behavioral: Negative.      Vital Signs: Ht 5\' 2"  (1.575 m)   Wt 138 lb (62.6 kg)   SpO2 97% Comment: 2.5 L  BMI 25.24 kg/m    Observation/Objective:  Pt is able to carry out conversation   Assessment/Plan: 1. COPD, frequent exacerbations (HCC) (Primary) Continue prednisone daily as before and will add zpak to hopefully treat infection before it progresses further. May continue mucinex. Advised to call if any concerns - azithromycin (ZITHROMAX) 250 MG tablet; Take 2 tablets on day 1, then 1 tablet daily on days 2 through 5  Dispense: 6 tablet; Refill: 0  2. Oxygen dependent Continue oxygen as before   General Counseling: Joyce Rodriguez understanding of the findings of today's phone visit and agrees with plan of treatment. I have discussed any further diagnostic evaluation that may be needed or ordered today. We also reviewed her medications today. she has been encouraged to call the office with any questions or concerns that should arise related to todays visit.    No orders of the defined types were placed in this encounter.   Meds ordered this encounter  Medications   azithromycin (ZITHROMAX) 250 MG tablet    Sig:  Take 2 tablets on day 1, then 1 tablet daily on days 2 through 5  Dispense:  6 tablet    Refill:  0    Time spent:25 Minutes    Dr Lyndon Code Internal medicine

## 2023-06-15 DIAGNOSIS — R7303 Prediabetes: Secondary | ICD-10-CM | POA: Diagnosis not present

## 2023-06-15 DIAGNOSIS — I10 Essential (primary) hypertension: Secondary | ICD-10-CM | POA: Diagnosis not present

## 2023-06-15 DIAGNOSIS — Z79899 Other long term (current) drug therapy: Secondary | ICD-10-CM | POA: Diagnosis not present

## 2023-06-15 DIAGNOSIS — E782 Mixed hyperlipidemia: Secondary | ICD-10-CM | POA: Diagnosis not present

## 2023-06-17 ENCOUNTER — Ambulatory Visit: Payer: PPO | Admitting: Physician Assistant

## 2023-06-17 DIAGNOSIS — J449 Chronic obstructive pulmonary disease, unspecified: Secondary | ICD-10-CM | POA: Diagnosis not present

## 2023-06-21 DIAGNOSIS — J449 Chronic obstructive pulmonary disease, unspecified: Secondary | ICD-10-CM | POA: Diagnosis not present

## 2023-06-22 ENCOUNTER — Other Ambulatory Visit: Payer: Self-pay | Admitting: Internal Medicine

## 2023-06-22 DIAGNOSIS — F419 Anxiety disorder, unspecified: Secondary | ICD-10-CM

## 2023-06-22 NOTE — Telephone Encounter (Signed)
 Please  refills pt will make follow up appt DSK did last  year

## 2023-06-23 DIAGNOSIS — F3341 Major depressive disorder, recurrent, in partial remission: Secondary | ICD-10-CM | POA: Diagnosis not present

## 2023-06-23 DIAGNOSIS — Z0000 Encounter for general adult medical examination without abnormal findings: Secondary | ICD-10-CM | POA: Diagnosis not present

## 2023-06-23 DIAGNOSIS — R7303 Prediabetes: Secondary | ICD-10-CM | POA: Diagnosis not present

## 2023-06-23 DIAGNOSIS — J431 Panlobular emphysema: Secondary | ICD-10-CM | POA: Diagnosis not present

## 2023-06-23 DIAGNOSIS — I10 Essential (primary) hypertension: Secondary | ICD-10-CM | POA: Diagnosis not present

## 2023-06-23 DIAGNOSIS — F339 Major depressive disorder, recurrent, unspecified: Secondary | ICD-10-CM | POA: Diagnosis not present

## 2023-06-23 DIAGNOSIS — E782 Mixed hyperlipidemia: Secondary | ICD-10-CM | POA: Diagnosis not present

## 2023-07-08 ENCOUNTER — Other Ambulatory Visit: Payer: Self-pay | Admitting: Nurse Practitioner

## 2023-07-13 ENCOUNTER — Ambulatory Visit: Admitting: Internal Medicine

## 2023-07-17 ENCOUNTER — Other Ambulatory Visit: Payer: Self-pay | Admitting: Internal Medicine

## 2023-07-17 ENCOUNTER — Emergency Department

## 2023-07-17 ENCOUNTER — Encounter (HOSPITAL_COMMUNITY): Payer: Self-pay | Admitting: Emergency Medicine

## 2023-07-17 ENCOUNTER — Other Ambulatory Visit: Payer: Self-pay

## 2023-07-17 ENCOUNTER — Encounter (HOSPITAL_COMMUNITY): Payer: Self-pay

## 2023-07-17 ENCOUNTER — Telehealth (HOSPITAL_COMMUNITY): Payer: Self-pay | Admitting: Emergency Medicine

## 2023-07-17 ENCOUNTER — Inpatient Hospital Stay (HOSPITAL_COMMUNITY)
Admission: EM | Admit: 2023-07-17 | Discharge: 2023-08-12 | DRG: 521 | Disposition: E | Source: Other Acute Inpatient Hospital | Attending: Orthopaedic Surgery | Admitting: Orthopaedic Surgery

## 2023-07-17 ENCOUNTER — Emergency Department
Admission: EM | Admit: 2023-07-17 | Discharge: 2023-07-17 | Disposition: A | Discharge status: Other Acute Inpatient Hospital | Attending: Emergency Medicine | Admitting: Emergency Medicine

## 2023-07-17 DIAGNOSIS — W19XXXA Unspecified fall, initial encounter: Secondary | ICD-10-CM | POA: Diagnosis present

## 2023-07-17 DIAGNOSIS — J9601 Acute respiratory failure with hypoxia: Secondary | ICD-10-CM | POA: Diagnosis not present

## 2023-07-17 DIAGNOSIS — Z9981 Dependence on supplemental oxygen: Secondary | ICD-10-CM | POA: Diagnosis not present

## 2023-07-17 DIAGNOSIS — Z7952 Long term (current) use of systemic steroids: Secondary | ICD-10-CM | POA: Diagnosis not present

## 2023-07-17 DIAGNOSIS — J9602 Acute respiratory failure with hypercapnia: Secondary | ICD-10-CM | POA: Insufficient documentation

## 2023-07-17 DIAGNOSIS — R471 Dysarthria and anarthria: Secondary | ICD-10-CM | POA: Diagnosis not present

## 2023-07-17 DIAGNOSIS — Z96612 Presence of left artificial shoulder joint: Secondary | ICD-10-CM | POA: Diagnosis not present

## 2023-07-17 DIAGNOSIS — M80052A Age-related osteoporosis with current pathological fracture, left femur, initial encounter for fracture: Principal | ICD-10-CM | POA: Diagnosis present

## 2023-07-17 DIAGNOSIS — D649 Anemia, unspecified: Secondary | ICD-10-CM | POA: Diagnosis present

## 2023-07-17 DIAGNOSIS — G928 Other toxic encephalopathy: Secondary | ICD-10-CM | POA: Diagnosis not present

## 2023-07-17 DIAGNOSIS — Z888 Allergy status to other drugs, medicaments and biological substances status: Secondary | ICD-10-CM

## 2023-07-17 DIAGNOSIS — S32511A Fracture of superior rim of right pubis, initial encounter for closed fracture: Secondary | ICD-10-CM | POA: Diagnosis not present

## 2023-07-17 DIAGNOSIS — J90 Pleural effusion, not elsewhere classified: Secondary | ICD-10-CM | POA: Diagnosis not present

## 2023-07-17 DIAGNOSIS — D62 Acute posthemorrhagic anemia: Secondary | ICD-10-CM | POA: Diagnosis not present

## 2023-07-17 DIAGNOSIS — R000 Tachycardia, unspecified: Secondary | ICD-10-CM | POA: Diagnosis not present

## 2023-07-17 DIAGNOSIS — Z87891 Personal history of nicotine dependence: Secondary | ICD-10-CM | POA: Diagnosis not present

## 2023-07-17 DIAGNOSIS — T3995XA Adverse effect of unspecified nonopioid analgesic, antipyretic and antirheumatic, initial encounter: Secondary | ICD-10-CM | POA: Diagnosis not present

## 2023-07-17 DIAGNOSIS — K683 Retroperitoneal hematoma: Secondary | ICD-10-CM | POA: Diagnosis not present

## 2023-07-17 DIAGNOSIS — I2510 Atherosclerotic heart disease of native coronary artery without angina pectoris: Secondary | ICD-10-CM | POA: Diagnosis not present

## 2023-07-17 DIAGNOSIS — Z043 Encounter for examination and observation following other accident: Secondary | ICD-10-CM | POA: Diagnosis not present

## 2023-07-17 DIAGNOSIS — Z79899 Other long term (current) drug therapy: Secondary | ICD-10-CM

## 2023-07-17 DIAGNOSIS — I6389 Other cerebral infarction: Secondary | ICD-10-CM | POA: Diagnosis not present

## 2023-07-17 DIAGNOSIS — F418 Other specified anxiety disorders: Secondary | ICD-10-CM | POA: Diagnosis present

## 2023-07-17 DIAGNOSIS — S32512A Fracture of superior rim of left pubis, initial encounter for closed fracture: Secondary | ICD-10-CM | POA: Diagnosis not present

## 2023-07-17 DIAGNOSIS — K5730 Diverticulosis of large intestine without perforation or abscess without bleeding: Secondary | ICD-10-CM | POA: Diagnosis not present

## 2023-07-17 DIAGNOSIS — Y92009 Unspecified place in unspecified non-institutional (private) residence as the place of occurrence of the external cause: Secondary | ICD-10-CM | POA: Diagnosis not present

## 2023-07-17 DIAGNOSIS — R569 Unspecified convulsions: Secondary | ICD-10-CM | POA: Diagnosis not present

## 2023-07-17 DIAGNOSIS — Z01810 Encounter for preprocedural cardiovascular examination: Secondary | ICD-10-CM | POA: Diagnosis not present

## 2023-07-17 DIAGNOSIS — J439 Emphysema, unspecified: Secondary | ICD-10-CM | POA: Diagnosis not present

## 2023-07-17 DIAGNOSIS — I272 Pulmonary hypertension, unspecified: Secondary | ICD-10-CM | POA: Diagnosis present

## 2023-07-17 DIAGNOSIS — S79912A Unspecified injury of left hip, initial encounter: Secondary | ICD-10-CM | POA: Diagnosis not present

## 2023-07-17 DIAGNOSIS — E872 Acidosis, unspecified: Secondary | ICD-10-CM | POA: Diagnosis not present

## 2023-07-17 DIAGNOSIS — Y92019 Unspecified place in single-family (private) house as the place of occurrence of the external cause: Secondary | ICD-10-CM

## 2023-07-17 DIAGNOSIS — Z8041 Family history of malignant neoplasm of ovary: Secondary | ICD-10-CM

## 2023-07-17 DIAGNOSIS — M47812 Spondylosis without myelopathy or radiculopathy, cervical region: Secondary | ICD-10-CM | POA: Diagnosis not present

## 2023-07-17 DIAGNOSIS — Z808 Family history of malignant neoplasm of other organs or systems: Secondary | ICD-10-CM

## 2023-07-17 DIAGNOSIS — S32591A Other specified fracture of right pubis, initial encounter for closed fracture: Secondary | ICD-10-CM | POA: Diagnosis present

## 2023-07-17 DIAGNOSIS — R4182 Altered mental status, unspecified: Secondary | ICD-10-CM | POA: Diagnosis not present

## 2023-07-17 DIAGNOSIS — R29721 NIHSS score 21: Secondary | ICD-10-CM | POA: Diagnosis not present

## 2023-07-17 DIAGNOSIS — S72012A Unspecified intracapsular fracture of left femur, initial encounter for closed fracture: Secondary | ICD-10-CM | POA: Diagnosis not present

## 2023-07-17 DIAGNOSIS — E785 Hyperlipidemia, unspecified: Secondary | ICD-10-CM | POA: Diagnosis present

## 2023-07-17 DIAGNOSIS — Z825 Family history of asthma and other chronic lower respiratory diseases: Secondary | ICD-10-CM

## 2023-07-17 DIAGNOSIS — Z96642 Presence of left artificial hip joint: Secondary | ICD-10-CM | POA: Diagnosis not present

## 2023-07-17 DIAGNOSIS — N9489 Other specified conditions associated with female genital organs and menstrual cycle: Secondary | ICD-10-CM | POA: Diagnosis not present

## 2023-07-17 DIAGNOSIS — Z7951 Long term (current) use of inhaled steroids: Secondary | ICD-10-CM

## 2023-07-17 DIAGNOSIS — R0902 Hypoxemia: Secondary | ICD-10-CM | POA: Diagnosis not present

## 2023-07-17 DIAGNOSIS — J449 Chronic obstructive pulmonary disease, unspecified: Secondary | ICD-10-CM | POA: Diagnosis not present

## 2023-07-17 DIAGNOSIS — Z515 Encounter for palliative care: Secondary | ICD-10-CM

## 2023-07-17 DIAGNOSIS — J441 Chronic obstructive pulmonary disease with (acute) exacerbation: Secondary | ICD-10-CM | POA: Insufficient documentation

## 2023-07-17 DIAGNOSIS — I69391 Dysphagia following cerebral infarction: Secondary | ICD-10-CM | POA: Diagnosis not present

## 2023-07-17 DIAGNOSIS — E538 Deficiency of other specified B group vitamins: Secondary | ICD-10-CM | POA: Diagnosis not present

## 2023-07-17 DIAGNOSIS — R0689 Other abnormalities of breathing: Secondary | ICD-10-CM | POA: Diagnosis not present

## 2023-07-17 DIAGNOSIS — G9341 Metabolic encephalopathy: Secondary | ICD-10-CM | POA: Diagnosis present

## 2023-07-17 DIAGNOSIS — I10 Essential (primary) hypertension: Secondary | ICD-10-CM | POA: Insufficient documentation

## 2023-07-17 DIAGNOSIS — S7292XA Unspecified fracture of left femur, initial encounter for closed fracture: Secondary | ICD-10-CM | POA: Diagnosis not present

## 2023-07-17 DIAGNOSIS — J9612 Chronic respiratory failure with hypercapnia: Secondary | ICD-10-CM | POA: Diagnosis present

## 2023-07-17 DIAGNOSIS — I700 Atherosclerosis of aorta: Secondary | ICD-10-CM | POA: Diagnosis present

## 2023-07-17 DIAGNOSIS — Z7189 Other specified counseling: Secondary | ICD-10-CM | POA: Diagnosis not present

## 2023-07-17 DIAGNOSIS — M1990 Unspecified osteoarthritis, unspecified site: Secondary | ICD-10-CM | POA: Diagnosis present

## 2023-07-17 DIAGNOSIS — I739 Peripheral vascular disease, unspecified: Secondary | ICD-10-CM | POA: Diagnosis not present

## 2023-07-17 DIAGNOSIS — Z801 Family history of malignant neoplasm of trachea, bronchus and lung: Secondary | ICD-10-CM

## 2023-07-17 DIAGNOSIS — S300XXA Contusion of lower back and pelvis, initial encounter: Secondary | ICD-10-CM | POA: Diagnosis present

## 2023-07-17 DIAGNOSIS — J9622 Acute and chronic respiratory failure with hypercapnia: Secondary | ICD-10-CM | POA: Diagnosis present

## 2023-07-17 DIAGNOSIS — Z9181 History of falling: Secondary | ICD-10-CM

## 2023-07-17 DIAGNOSIS — J80 Acute respiratory distress syndrome: Secondary | ICD-10-CM | POA: Diagnosis not present

## 2023-07-17 DIAGNOSIS — Z66 Do not resuscitate: Secondary | ICD-10-CM | POA: Diagnosis not present

## 2023-07-17 DIAGNOSIS — J9621 Acute and chronic respiratory failure with hypoxia: Secondary | ICD-10-CM | POA: Diagnosis not present

## 2023-07-17 DIAGNOSIS — I63431 Cerebral infarction due to embolism of right posterior cerebral artery: Secondary | ICD-10-CM | POA: Diagnosis not present

## 2023-07-17 DIAGNOSIS — Z471 Aftercare following joint replacement surgery: Secondary | ICD-10-CM | POA: Diagnosis not present

## 2023-07-17 DIAGNOSIS — Z9911 Dependence on respirator [ventilator] status: Secondary | ICD-10-CM | POA: Diagnosis not present

## 2023-07-17 DIAGNOSIS — A419 Sepsis, unspecified organism: Secondary | ICD-10-CM | POA: Insufficient documentation

## 2023-07-17 DIAGNOSIS — R54 Age-related physical debility: Secondary | ICD-10-CM | POA: Diagnosis present

## 2023-07-17 DIAGNOSIS — S72002A Fracture of unspecified part of neck of left femur, initial encounter for closed fracture: Secondary | ICD-10-CM

## 2023-07-17 DIAGNOSIS — K219 Gastro-esophageal reflux disease without esophagitis: Secondary | ICD-10-CM | POA: Diagnosis present

## 2023-07-17 DIAGNOSIS — S72009A Fracture of unspecified part of neck of unspecified femur, initial encounter for closed fracture: Secondary | ICD-10-CM | POA: Diagnosis present

## 2023-07-17 DIAGNOSIS — Z9049 Acquired absence of other specified parts of digestive tract: Secondary | ICD-10-CM

## 2023-07-17 DIAGNOSIS — Z90710 Acquired absence of both cervix and uterus: Secondary | ICD-10-CM

## 2023-07-17 DIAGNOSIS — I6782 Cerebral ischemia: Secondary | ICD-10-CM | POA: Diagnosis not present

## 2023-07-17 DIAGNOSIS — Z885 Allergy status to narcotic agent status: Secondary | ICD-10-CM

## 2023-07-17 DIAGNOSIS — F329 Major depressive disorder, single episode, unspecified: Secondary | ICD-10-CM | POA: Diagnosis present

## 2023-07-17 DIAGNOSIS — J9690 Respiratory failure, unspecified, unspecified whether with hypoxia or hypercapnia: Secondary | ICD-10-CM | POA: Diagnosis not present

## 2023-07-17 DIAGNOSIS — M4802 Spinal stenosis, cervical region: Secondary | ICD-10-CM | POA: Diagnosis not present

## 2023-07-17 DIAGNOSIS — R1310 Dysphagia, unspecified: Secondary | ICD-10-CM | POA: Diagnosis present

## 2023-07-17 DIAGNOSIS — I6381 Other cerebral infarction due to occlusion or stenosis of small artery: Secondary | ICD-10-CM | POA: Diagnosis not present

## 2023-07-17 DIAGNOSIS — I639 Cerebral infarction, unspecified: Secondary | ICD-10-CM | POA: Diagnosis not present

## 2023-07-17 DIAGNOSIS — Z8701 Personal history of pneumonia (recurrent): Secondary | ICD-10-CM

## 2023-07-17 DIAGNOSIS — I952 Hypotension due to drugs: Secondary | ICD-10-CM | POA: Diagnosis not present

## 2023-07-17 DIAGNOSIS — Z4682 Encounter for fitting and adjustment of non-vascular catheter: Secondary | ICD-10-CM | POA: Diagnosis not present

## 2023-07-17 DIAGNOSIS — J9611 Chronic respiratory failure with hypoxia: Secondary | ICD-10-CM | POA: Diagnosis present

## 2023-07-17 DIAGNOSIS — S72042A Displaced fracture of base of neck of left femur, initial encounter for closed fracture: Secondary | ICD-10-CM | POA: Diagnosis not present

## 2023-07-17 DIAGNOSIS — J9811 Atelectasis: Secondary | ICD-10-CM | POA: Diagnosis not present

## 2023-07-17 DIAGNOSIS — R296 Repeated falls: Secondary | ICD-10-CM | POA: Diagnosis present

## 2023-07-17 DIAGNOSIS — R339 Retention of urine, unspecified: Secondary | ICD-10-CM | POA: Diagnosis not present

## 2023-07-17 DIAGNOSIS — R739 Hyperglycemia, unspecified: Secondary | ICD-10-CM | POA: Diagnosis present

## 2023-07-17 DIAGNOSIS — R410 Disorientation, unspecified: Secondary | ICD-10-CM | POA: Diagnosis not present

## 2023-07-17 LAB — BLOOD GAS, VENOUS
Acid-Base Excess: 9.1 mmol/L — ABNORMAL HIGH (ref 0.0–2.0)
Acid-Base Excess: 5.7 mmol/L — ABNORMAL HIGH (ref 0.0–2.0)
Bicarbonate: 39.9 mmol/L — ABNORMAL HIGH (ref 20.0–28.0)
Bicarbonate: 33.2 mmol/L — ABNORMAL HIGH (ref 20.0–28.0)
Delivery systems: POSITIVE
FIO2: 45 %
O2 Saturation: 59.6 %
O2 Saturation: 75.8 %
Patient temperature: 37
Patient temperature: 37
RATE: 10 {breaths}/min
pCO2, Ven: 89 mmHg (ref 44–60)
pCO2, Ven: 63 mmHg — ABNORMAL HIGH (ref 44–60)
pH, Ven: 7.26 (ref 7.25–7.43)
pH, Ven: 7.33 (ref 7.25–7.43)
pO2, Ven: 40 mmHg (ref 32–45)
pO2, Ven: 46 mmHg — ABNORMAL HIGH (ref 32–45)

## 2023-07-17 LAB — LACTIC ACID, PLASMA
Lactic Acid, Venous: 2.1 mmol/L (ref 0.5–1.9)
Lactic Acid, Venous: 2.6 mmol/L (ref 0.5–1.9)

## 2023-07-17 LAB — COMPREHENSIVE METABOLIC PANEL WITH GFR
ALT: 27 U/L (ref 0–44)
AST: 23 U/L (ref 15–41)
Albumin: 3.5 g/dL (ref 3.5–5.0)
Alkaline Phosphatase: 54 U/L (ref 38–126)
Anion gap: 8 (ref 5–15)
BUN: 17 mg/dL (ref 8–23)
CO2: 34 mmol/L — ABNORMAL HIGH (ref 22–32)
Calcium: 8.9 mg/dL (ref 8.9–10.3)
Chloride: 97 mmol/L — ABNORMAL LOW (ref 98–111)
Creatinine, Ser: 0.6 mg/dL (ref 0.44–1.00)
GFR, Estimated: >60 mL/min (ref >60)
Glucose, Bld: 246 mg/dL — ABNORMAL HIGH (ref 70–99)
Potassium: 4.1 mmol/L (ref 3.5–5.1)
Sodium: 139 mmol/L (ref 135–145)
Total Bilirubin: 0.5 mg/dL (ref 0.0–1.2)
Total Protein: 6.4 g/dL — ABNORMAL LOW (ref 6.5–8.1)

## 2023-07-17 LAB — PROTIME-INR
INR: 1.1 (ref 0.8–1.2)
Prothrombin Time: 13.9 s (ref 11.4–15.2)

## 2023-07-17 LAB — TROPONIN I (HIGH SENSITIVITY): Troponin I (High Sensitivity): 35 ng/L — ABNORMAL HIGH (ref <18)

## 2023-07-17 LAB — CBC WITH DIFFERENTIAL/PLATELET
Abs Immature Granulocytes: 0.78 10*3/uL — ABNORMAL HIGH (ref 0.00–0.07)
Basophils Absolute: 0.1 10*3/uL (ref 0.0–0.1)
Basophils Relative: 0 %
Eosinophils Absolute: 0 10*3/uL (ref 0.0–0.5)
Eosinophils Relative: 0 %
HCT: 39.2 % (ref 36.0–46.0)
Hemoglobin: 12 g/dL (ref 12.0–15.0)
Immature Granulocytes: 4 %
Lymphocytes Relative: 10 %
Lymphs Abs: 2.1 10*3/uL (ref 0.7–4.0)
MCH: 31 pg (ref 26.0–34.0)
MCHC: 30.6 g/dL (ref 30.0–36.0)
MCV: 101.3 fL — ABNORMAL HIGH (ref 80.0–100.0)
Monocytes Absolute: 0.7 10*3/uL (ref 0.1–1.0)
Monocytes Relative: 3 %
Neutro Abs: 18.2 10*3/uL — ABNORMAL HIGH (ref 1.7–7.7)
Neutrophils Relative %: 83 %
Platelets: 307 10*3/uL (ref 150–400)
RBC: 3.87 MIL/uL (ref 3.87–5.11)
RDW: 13.7 % (ref 11.5–15.5)
WBC: 21.9 10*3/uL — ABNORMAL HIGH (ref 4.0–10.5)
nRBC: 0 % (ref 0.0–0.2)

## 2023-07-17 LAB — RESP PANEL BY RT-PCR (RSV, FLU A&B, COVID)  RVPGX2
Influenza A by PCR: NEGATIVE
Influenza B by PCR: NEGATIVE
Resp Syncytial Virus by PCR: NEGATIVE
SARS Coronavirus 2 by RT PCR: NEGATIVE

## 2023-07-17 LAB — BRAIN NATRIURETIC PEPTIDE: B Natriuretic Peptide: 113.9 pg/mL — ABNORMAL HIGH (ref 0.0–100.0)

## 2023-07-17 MED ORDER — SODIUM CHLORIDE 0.9 % IV SOLN
2.0000 g | Freq: Once | INTRAVENOUS | Status: AC
  Administered 2023-07-17: 2 g via INTRAVENOUS
  Filled 2023-07-17: qty 20

## 2023-07-17 MED ORDER — VANCOMYCIN HCL IN DEXTROSE 1-5 GM/200ML-% IV SOLN: 1000.0000 mg | Freq: Once | INTRAVENOUS | Status: DC

## 2023-07-17 MED ORDER — AZITHROMYCIN 500 MG IV SOLR
500.0000 mg | Freq: Once | INTRAVENOUS | Status: AC
  Administered 2023-07-17: 500 mg via INTRAVENOUS
  Filled 2023-07-17: qty 5

## 2023-07-17 MED ORDER — CEFEPIME HCL 2 G IV SOLR
2.0000 g | Freq: Once | INTRAVENOUS | Status: AC
  Administered 2023-07-17: 2 g via INTRAVENOUS
  Filled 2023-07-17: qty 12.5

## 2023-07-17 MED ORDER — FENTANYL CITRATE PF 50 MCG/ML IJ SOSY
50.0000 ug | PREFILLED_SYRINGE | Freq: Once | INTRAMUSCULAR | Status: AC
  Administered 2023-07-17: 50 ug via INTRAVENOUS
  Filled 2023-07-17: qty 1

## 2023-07-17 MED ORDER — LORAZEPAM 0.5 MG PO TABS
0.5000 mg | ORAL_TABLET | Freq: Once | ORAL | Status: DC
Start: 2023-07-17 — End: 2023-07-17

## 2023-07-17 MED ORDER — ALBUTEROL SULFATE (2.5 MG/3ML) 0.083% IN NEBU
10.0000 mg | INHALATION_SOLUTION | Freq: Once | RESPIRATORY_TRACT | Status: AC
  Administered 2023-07-17: 10 mg via RESPIRATORY_TRACT
  Filled 2023-07-17: qty 12

## 2023-07-17 MED ORDER — LORAZEPAM 2 MG/ML IJ SOLN
0.5000 mg | Freq: Once | INTRAMUSCULAR | Status: AC
  Administered 2023-07-17: 0.5 mg via INTRAVENOUS
  Filled 2023-07-17: qty 1

## 2023-07-17 MED ORDER — LACTATED RINGERS IV BOLUS (SEPSIS)
1000.0000 mL | Freq: Once | INTRAVENOUS | Status: AC
  Administered 2023-07-17: 1000 mL via INTRAVENOUS

## 2023-07-17 MED ORDER — VANCOMYCIN HCL 1500 MG/300ML IV SOLN
1500.0000 mg | Freq: Once | INTRAVENOUS | Status: DC
  Filled 2023-07-17: qty 300

## 2023-07-17 NOTE — Sepsis Progress Note (Signed)
 Sepsis protocol is being followed by [facility].

## 2023-07-17 NOTE — ED Notes (Signed)
 To CT then room 15.

## 2023-07-17 NOTE — Group Note (Deleted)
 Date:  07/17/2023 Time:  9:53 PM  Group Topic/Focus:  Wrap-Up Group:   The focus of this group is to help patients review their daily goal of treatment and discuss progress on daily workbooks.     Participation Level:  {BHH PARTICIPATION WUJWJ:19147}  Participation Quality:  {BHH PARTICIPATION QUALITY:22265}  Affect:  {BHH AFFECT:22266}  Cognitive:  {BHH COGNITIVE:22267}  Insight: {BHH Insight2:20797}  Engagement in Group:  {BHH ENGAGEMENT IN WGNFA:21308}  Modes of Intervention:  {BHH MODES OF INTERVENTION:22269}  Additional Comments:  ***  Maglione,Angell Honse E 07/17/2023, 9:53 PM

## 2023-07-17 NOTE — Progress Notes (Signed)
 ED Pharmacy Antibiotic Sign Off An antibiotic consult was received from an ED provider for vancomycin and cefepime per pharmacy dosing for sepsis. A chart review was completed to assess appropriateness.   The following one time order(s) were placed:  Vanc 1500 mg IV x 1 Cefepime 2 g IV x 1  Further antibiotic and/or antibiotic pharmacy consults should be ordered by the admitting provider if indicated.   Thank you for allowing pharmacy to be a part of this patient's care.   Merryl Hacker, [facility]  Clinical Pharmacist 07/17/23 10:00 PM

## 2023-07-17 NOTE — ED Triage Notes (Signed)
 Patient is a transfer from [place]. Patient arrives on Bi-Pap, with known bilateral femoral head fractures.

## 2023-07-17 NOTE — ED Triage Notes (Signed)
 pt arrives to ED via [facility] from home for fall and RD  Was able to bear weight on scene, 67% her chronic 3L  on cpap, 95%  Meds given: 2 duobens, 2 albuterol, 2g mag, 125mg  solu med  EMS VS: 149/78, 29 ETCO2, RR 16  EDP, RT, CN at bedside  Pt being placed on bipap

## 2023-07-17 NOTE — ED Notes (Signed)
 CO2 89 on VBG called by RT. EDP Wells informed.

## 2023-07-17 NOTE — Progress Notes (Signed)
   07/17/23 2351  BiPAP/CPAP/SIPAP  $ Non-Invasive Ventilator  Non-Invasive Vent Initial  $ Face Mask Medium Yes  BiPAP/CPAP/SIPAP Pt Type Adult  BiPAP/CPAP/SIPAP SERVO  Mask Type Full face mask  Mask Size Medium  Set Rate 15 breaths/min  Respiratory Rate 24 breaths/min  IPAP 12 cmH20  EPAP 5 cmH2O  FiO2 (%) 45 %  Minute Ventilation 10  Leak 11  Peak Inspiratory Pressure (PIP) 16  Tidal Volume (Vt) 397  Patient Home Machine No  Patient Home Mask No  Patient Home Tubing No  Auto Titrate No  Device Plugged into RED Power Outlet Yes  BiPAP/CPAP /SiPAP Vitals  Pulse Rate (!) 143  Resp (!) 24  Bilateral Breath Sounds Clear;Diminished  MEWS Score/Color  MEWS Score 4  MEWS Score Color Red

## 2023-07-17 NOTE — Progress Notes (Addendum)
 Plan of Care Note for accepted transfer   Patient: Joyce Rodriguez MRN: [id number]   DOA: 07/17/2023  Facility requesting transfer: [facility]  Requesting Provider: Dr. Arnoldo Morale   Reason for transfer: Left hip fracture, COPD exacerbation with acute on chronic hypoxic and hypercarbic respiratory failure   Facility course: 78 yr old female with HTN, HLD, CAD, COPD, and chronic 3 Lpm supplemental O2 requirement presents with respiratory distress and left hip pain after a fall.   She was hypoxic on her usual supplemental O2 with EMS and was placed on CPAP and given multiple nebs, IV mag, and IV Solu-Medrol pta.   Initial pCO2 was 89, improved to 63 on BiPAP. Plain radiographs reveal left hip fracture and pubic ramus fracture.   Dr. Steward Drone of orthopedic surgery was consulted and recommended medical admission to [facility].   Plan of care: The patient is accepted for admission to Progressive unit, at [facility].   Author: Briscoe Deutscher, MD 07/17/2023  Check [email or web address] for on-call coverage.  Nursing staff, Please call [facility] Admits & Consults System-Wide number on Amion as soon as patient's arrival, so appropriate admitting provider can evaluate the pt.

## 2023-07-17 NOTE — ED Notes (Signed)
 Family at bedside.

## 2023-07-17 NOTE — ED Notes (Signed)
 RT on way, will bring to CT then room 15.

## 2023-07-17 NOTE — ED Provider Notes (Addendum)
 Care assumed of patient from outgoing provider.  See their note for initial history, exam and plan.  Clinical Course as of 07/17/23 2009  Sat Jul 17, 2023  1720 pCO2, Ven(!!): 89 [DW]  1912 Family confirms patient is DNR/DNI [DW]  1926 Orthopedics requesting transfer to [facility]. [DW]  1939 Patient with past medical history significant for COPD and presented to the emergency department following a fall and with COPD exacerbation.  Patient currently on BiPAP and received continuous nebulizer treatments.  Patient is DNR/DNI.  Found to have a hip fracture.  After discussion with orthopedics had recommended transfer to [facility] given her complexity of medical history with a hip fracture thought that she would be better served at that hospital.  Currently waiting on transfer. [SM]  2009 Updated Dr. Antionette Char on CT findings and hematoma. [SM]    Clinical Course User Index [DW] Janith Lima, MD [SM] Corena Herter, MD     Discussed with hospitalist Dr. Antionette Char, accepted in transfer.  They did not have an intermediate stepdown bed.  Read triage to ER to ER.  Likely will have a stepdown bed in the morning available.  Added on vancomycin and cefepime to cover for possible sepsis.  .Critical Care  Performed by: Corena Herter, MD Authorized by: Corena Herter, MD   Critical care provider statement:    Critical care time (minutes):  30   Critical care time was exclusive of:  Separately billable procedures and treating other patients   Critical care was necessary to treat or prevent imminent or life-threatening deterioration of the following conditions:  Trauma   Critical care was time spent personally by me on the following activities:  Development of treatment plan with patient or surrogate, discussions with consultants, evaluation of patient's response to treatment, examination of patient, ordering and review of laboratory studies, ordering and review of radiographic studies, ordering and  performing treatments and interventions, pulse oximetry, re-evaluation of patient's condition and review of old charts   Care discussed with: accepting provider at another facility     Corena Herter, MD 07/17/23 1950    Corena Herter, MD 07/17/23 Rushie Goltz    Corena Herter, MD 07/17/23 Rushie Goltz    Corena Herter, MD 07/17/23 2009    Corena Herter, MD 07/17/23 2009    Corena Herter, MD 07/17/23 2213

## 2023-07-17 NOTE — Telephone Encounter (Signed)
 Called by ED physician at [facility].  Patient is 78 year old female who had a fall with multiple pelvic fractures and is having a COPD exacerbation.  Accepted by Dr Antionette Char for a progressive bed for a COPD exacerbation since she is on BiPAP has hypercarbia; however, there are no progressive beds available at this time and are requesting ED to ED transfer since the patient will require operative repair here after discussion with the orthopedist Dr Donaciano Eva. Accepted for transfer

## 2023-07-17 NOTE — ED Provider Notes (Signed)
 [place] EMERGENCY DEPARTMENT AT [facility] Provider Note   CSN: [id number] Arrival date & time: 07/17/23  2347     History {Add pertinent medical, surgical, social history, OB history to HPI:1} No chief complaint on file.   Joyce Rodriguez is a 78 y.o. female.  HPI     Home Medications Prior to Admission medications   Medication Sig Start Date End Date Taking? Authorizing Provider  acetaminophen (TYLENOL) 500 MG tablet Take 1,000 mg by mouth every 6 (six) hours as needed for moderate pain.    [provider]  albuterol (VENTOLIN HFA) 108 (90 Base) MCG/ACT inhaler Inhale 2 puffs into the lungs every 4 (four) hours as needed for wheezing or shortness of breath. 04/19/23 07/18/23  Yevonne Pax, MD  ALPRAZolam Prudy Feeler) 0.25 MG tablet Take 0.5 tablets (0.125 mg total) by mouth 2 (two) times daily as needed for anxiety. 08/13/22   Yevonne Pax, MD  amitriptyline (ELAVIL) 25 MG tablet Take 25 mg by mouth at bedtime.    [provider]  aspirin EC 325 MG tablet Take 1 tablet (325 mg total) by mouth daily. Patient not taking: Reported on 07/17/2023 09/11/20   Anson Oregon, PA-C  Budeson-Glycopyrrol-Formoterol (BREZTRI AEROSPHERE) 160-9-4.8 MCG/ACT AERO Inhale 2 puffs into the lungs 2 (two) times daily. 04/19/23   Yevonne Pax, MD  busPIRone (BUSPAR) 10 MG tablet Take 1 tablet by mouth 2 (two) times daily. 06/23/23 06/22/24  [provider]  carvedilol (COREG) 25 MG tablet Take 25 mg by mouth 2 (two) times daily.    [provider]  Docusate Calcium (STOOL SOFTENER PO) Take 2 tablets by mouth daily.    [provider]  escitalopram (LEXAPRO) 10 MG tablet Take 1 tablet (10 mg total) by mouth daily. Patient taking differently: Take 10 mg by mouth every morning. 11/24/18   Steele Sizer, MD  ezetimibe (ZETIA) 10 MG tablet Take 10 mg by mouth every morning. 11/02/19   [provider]  fluticasone-salmeterol (WIXELA INHUB) 250-50  MCG/ACT AEPB INHALE 1 PUFF INTO THE LUNGS IN THE MORNING AND AT BEDTIME Patient not taking: Reported on 07/17/2023 02/16/22   Yevonne Pax, MD  furosemide (LASIX) 20 MG tablet Take 20 mg by mouth daily. 06/23/23   [provider]  omeprazole (PRILOSEC) 20 MG capsule TAKE 1 CAPSULE BY MOUTH DAILY AS NEEDED FOR HEARTBURN 07/08/23   Abernathy, Arlyss Repress, NP  OXYGEN Inhale 2.5-3 L into the lungs at bedtime. 3 L portable   uses [employer] for oxygen    [provider]  potassium chloride (K-DUR) 10 MEQ tablet Take 1 tablet (10 mEq total) by mouth daily. Patient not taking: Reported on 07/17/2023 11/24/18   Steele Sizer, MD  predniSONE (DELTASONE) 10 MG tablet Take 1 tablet (10 mg total) by mouth daily with breakfast. 04/19/23   Yevonne Pax, MD  Roflumilast 250 MCG TABS Take 1 tablet by mouth daily. 02/26/23   Sallyanne Kuster, NP  sulfamethoxazole-trimethoprim (BACTRIM DS) 800-160 MG tablet Take 1 tablet by mouth 2 (two) times daily. Patient not taking: Reported on 07/17/2023 01/15/22   Yevonne Pax, MD  theophylline (THEO-24) 100 MG 24 hr capsule Take 1 capsule (100 mg total) by mouth daily. Take 1 capsule by mouth daily. 02/26/23   Yevonne Pax, MD  tiotropium (SPIRIVA) 18 MCG inhalation capsule INHALE CONTENTS OF 1 CAPSULE ONCE DAILY USING HANDIHALER Patient not taking: Reported on 07/17/2023 07/27/22   Freda Munro  A, MD  traMADol (ULTRAM) 50 MG tablet Take 1-2 tablets (50-100 mg total) by mouth every 6 (six) hours as needed for moderate pain. 09/11/20   Anson Oregon, PA-C  zinc gluconate 50 MG tablet Take 50 mg by mouth daily.    [provider]      Allergies    Atorvastatin, Atrovent nasal spray [ipratropium], Codeine, and Lipitor [atorvastatin calcium]    Review of Systems   Review of Systems  Physical Exam Updated Vital Signs There were no vitals taken for this visit. Physical Exam  ED Results / Procedures / Treatments   Labs (all labs ordered  are listed, but only abnormal results are displayed) Labs Reviewed - No data to display  EKG None  Radiology CT PELVIS WO CONTRAST Result Date: 07/17/2023 CLINICAL DATA:  Ground level fall. EXAM: CT PELVIS WITHOUT CONTRAST TECHNIQUE: Multidetector CT imaging of the pelvis was performed following the standard protocol without intravenous contrast. RADIATION DOSE REDUCTION: This exam was performed according to the departmental dose-optimization program which includes automated exposure control, adjustment of the mA and/or kV according to patient size and/or use of iterative reconstruction technique. COMPARISON:  May 19, 2019 FINDINGS: Urinary Tract: A 3.5 cm x 11.6 cm x 9.3 cm hematoma is seen adjacent to the anterior aspect of an intact, moderate to markedly distended urinary bladder. This extends inferiorly along the region adjacent to the urinary bladder on the right, to the posterior aspect of acute comminuted fracture deformity involving the symphysis pubis on the right. Bowel: There is no evidence of bowel dilatation. Noninflamed diverticula are seen throughout the sigmoid colon. Vascular/Lymphatic: There is marked severity calcification of the visualized portion of the infrarenal abdominal aorta and bilateral common iliac arteries. No abnormal abdominal or pelvic lymph nodes are identified. Reproductive: The uterus is surgically absent. The bilateral adnexa are unremarkable. Other: A 1.5 cm diameter fat containing umbilical hernia is seen. No abdominopelvic ascites. Musculoskeletal: There is an acute, comminuted, mildly displaced fracture deformity involving the medial aspect of the right superior pubic ramus. This extends to the level of the symphysis pubis. Additional acute, nondisplaced fractures are seen involving the left superior pubic ramus, bilateral inferior pubic rami, and the head and neck of the proximal left femur. There is no evidence of dislocation. IMPRESSION: 1. Acute, comminuted,  mildly displaced fracture deformity involving the medial aspect of the right superior pubic ramus and symphysis pubis on the right. 2. Additional acute, nondisplaced fractures involving the left superior pubic ramus, bilateral inferior pubic rami, and the head and neck of the proximal left femur. 3. 3.5 cm x 11.6 cm x 9.3 cm hematoma adjacent to the anterior aspect of the urinary bladder, as described above. 4. Sigmoid diverticulosis. 5. Aortic atherosclerosis. Aortic Atherosclerosis (ICD10-I70.0). Electronically Signed   By: Aram Candela M.D.   On: 07/17/2023 19:49   DG Hip Unilat W or Wo Pelvis 2-3 Views Left Result Date: 07/17/2023 CLINICAL DATA:  Ground level fall hip injury EXAM: DG HIP (WITH OR WITHOUT PELVIS) 2-3V LEFT COMPARISON:  None Available. FINDINGS: SI joints are non widened. Pubic symphysis appears intact. Acute displaced left femoral neck fracture. No femoral head dislocation. Acute appearing mildly displaced right superior pubic ramus fracture. Linear lucency at the left inferior pubic ramus, cannot exclude nondisplaced fracture. IMPRESSION: 1. Acute displaced left femoral neck fracture. 2. Acute mildly displaced right superior pubic ramus fracture. 3. Linear lucency at the left inferior pubic ramus, cannot exclude nondisplaced fracture. Electronically Signed   By:  Jasmine Pang M.D.   On: 07/17/2023 18:21   CT Cervical Spine Wo Contrast Result Date: 07/17/2023 CLINICAL DATA:  Ground level fall EXAM: CT CERVICAL SPINE WITHOUT CONTRAST TECHNIQUE: Multidetector CT imaging of the cervical spine was performed without intravenous contrast. Multiplanar CT image reconstructions were also generated. RADIATION DOSE REDUCTION: This exam was performed according to the departmental dose-optimization program which includes automated exposure control, adjustment of the mA and/or kV according to patient size and/or use of iterative reconstruction technique. COMPARISON:  Radiograph report 12/31/2017  FINDINGS: Alignment: Significant motion degradation. Straightening of the cervical spine. Facet alignment grossly maintained Skull base and vertebrae: Limited by motion. Normal vertebral body heights. No gross fracture Soft tissues and spinal canal: No prevertebral fluid or swelling. No visible canal hematoma. Disc levels: Moderate severe diffuse disc space narrowing C3 through T1. Partial ankylosis C4-C5. Hypertrophic facet degenerative changes at multiple levels with foraminal narrowing Upper chest: Apical emphysema Other: None IMPRESSION: 1. Significant motion degradation. No gross acute osseous abnormality. 2. Multilevel degenerative changes. 3. Emphysema Electronically Signed   By: Jasmine Pang M.D.   On: 07/17/2023 18:19   CT Head Wo Contrast Result Date: 07/17/2023 CLINICAL DATA:  Ground level fall EXAM: CT HEAD WITHOUT CONTRAST TECHNIQUE: Contiguous axial images were obtained from the base of the skull through the vertex without intravenous contrast. RADIATION DOSE REDUCTION: This exam was performed according to the departmental dose-optimization program which includes automated exposure control, adjustment of the mA and/or kV according to patient size and/or use of iterative reconstruction technique. COMPARISON:  None Available. FINDINGS: Brain: No acute territorial infarction, hemorrhage or intracranial mass. Small chronic appearing left cerebellar infarcts. Atrophy and patchy white matter hypodensity likely chronic small vessel ischemic change. Nonenlarged ventricles Vascular: No hyperdense vessel or unexpected calcification. Skull: Normal. Negative for fracture or focal lesion. Sinuses/Orbits: No acute finding. Other: None IMPRESSION: 1. No CT evidence for acute intracranial abnormality. 2. Atrophy and chronic small vessel ischemic changes of the white matter. Small chronic appearing left cerebellar infarcts. Electronically Signed   By: Jasmine Pang M.D.   On: 07/17/2023 18:10   DG Chest Port 1  View Result Date: 07/17/2023 CLINICAL DATA:  Questionable sepsis - evaluate for abnormality Fall. EXAM: PORTABLE CHEST 1 VIEW COMPARISON:  Radiograph 04/21/2018 FINDINGS: The lungs are hyperinflated with at least moderate emphysema. Peribronchial thickening most prominent in the mid lower lung zones. Subsegmental atelectasis at the left lung base. Stable heart size and mediastinal contours. Aortic atherosclerosis. Calcified granuloma in the right lung. No pulmonary edema, pneumothorax, or large pleural effusion. Left shoulder arthroplasty. IMPRESSION: 1. Hyperinflation with at least moderate emphysema. Peribronchial thickening most prominent in the mid lower lung zones, may represent COPD exacerbation. 2. Subsegmental atelectasis at the left lung base. Electronically Signed   By: Narda Rutherford M.D.   On: 07/17/2023 17:51    Procedures Procedures  {Document cardiac monitor, telemetry assessment procedure when appropriate:1}  Medications Ordered in ED Medications - No data to display  ED Course/ Medical Decision Making/ A&P   {   Click here for ABCD2, HEART and other calculatorsREFRESH Note before signing :1}                              Medical Decision Making  ***  {Document critical care time when appropriate:1} {Document review of labs and clinical decision tools ie heart score, Chads2Vasc2 etc:1}  {Document your independent review of radiology images, and any  outside records:1} {Document your discussion with family members, caretakers, and with consultants:1} {Document social determinants of health affecting pt's care:1} {Document your decision making why or why not admission, treatments were needed:1} Final Clinical Impression(s) / ED Diagnoses Final diagnoses:  None    Rx / DC Orders ED Discharge Orders     None

## 2023-07-17 NOTE — Progress Notes (Signed)
 CODE SEPSIS - PHARMACY COMMUNICATION  **Broad Spectrum Antibiotics should be administered within 1 hour of Sepsis diagnosis**  Time Code Sepsis Called/Page Received: 1708  Antibiotics Ordered: azithromycin, ceftriaxone  Time of 1st antibiotic administration: 1729  Additional action taken by pharmacy: N/A  If necessary, Name of Provider/Nurse Contacted: N/A    Merryl Hacker ,PharmD Clinical Pharmacist  07/17/2023  5:09 PM

## 2023-07-17 NOTE — ED Provider Notes (Addendum)
 [facility] Provider Note    Event Date/Time   First MD Initiated Contact with Patient 07/17/23 1706     (approximate)   History   Respiratory Distress   HPI Joyce Rodriguez is a 78 y.o. female with history of COPD on 3 L chronically, CAD, HTN, HLD presenting today for shortness of breath.  Patient reportedly had a fall at home which prompted the EMS call and when they found her she was in significant respiratory distress.  She was 67% on her baseline 3 L and transition to CPAP.  She was able to ambulate on her lower extremities but reportedly noted pain to her left hip as well as hitting the back of her head and neck.  Denied loss of consciousness.  Denies currently having any pain in her arms, legs, chest, or back.  EMS gave her 2 DuoNebs, 2 albuterol, 2 g magnesium, and 125 mg Solu-Medrol.     Physical Exam   Triage Vital Signs: ED Triage Vitals  Encounter Vitals Group     BP 07/17/23 1710 (!) 154/93     Systolic BP Percentile --      Diastolic BP Percentile --      Pulse Rate 07/17/23 1710 (!) 131     Resp 07/17/23 1710 20     Temp 07/17/23 1710 97.8 F (36.6 C)     Temp Source 07/17/23 1710 Axillary     SpO2 07/17/23 1710 92 %     Weight 07/17/23 1712 150 lb (68 kg)     Height 07/17/23 1712 5\' 6"  (1.676 m)     Head Circumference --      Peak Flow --      Pain Score 07/17/23 1706 0     Pain Loc --      Pain Education --      Exclude from Growth Chart --     Most recent vital signs: Vitals:   07/17/23 1800 07/17/23 1802  BP: (!) 145/69   Pulse: (!) 140   Resp: 12   Temp:    SpO2: 91% 92%   Physical Exam: I have reviewed the vital signs and nursing notes. General: Awake, alert, no acute distress.  Nontoxic appearing. Head:  Atraumatic, normocephalic.  No significant tenderness anywhere to the posterior scalp. ENT:  EOM intact, PERRL. Oral mucosa is pink and moist with no lesions. Neck: Neck is supple with full range of motion, no  C-spine tenderness palpation. Cardiovascular:  RRR, No murmurs. Peripheral pulses palpable and equal bilaterally. Respiratory:  Symmetrical chest wall expansion.  Tachypneic with diminished breath sounds in the bases and slight expiratory wheezing noted as well in the apices Musculoskeletal:  No cyanosis or edema. Moving extremities with full ROM Abdomen:  Soft, nontender, nondistended. Neuro:  GCS 15, moving all four extremities, interacting appropriately. Speech clear. Psych:  Calm, appropriate.   Skin:  Warm, dry, no rash.    ED Results / Procedures / Treatments   Labs (all labs ordered are listed, but only abnormal results are displayed) Labs Reviewed  LACTIC ACID, PLASMA - Abnormal; Notable for the following components:      Result Value   Lactic Acid, Venous 2.1 (*)    All other components within normal limits  COMPREHENSIVE METABOLIC PANEL WITH GFR - Abnormal; Notable for the following components:   Chloride 97 (*)    CO2 34 (*)    Glucose, Bld 246 (*)    Total Protein 6.4 (*)  All other components within normal limits  CBC WITH DIFFERENTIAL/PLATELET - Abnormal; Notable for the following components:   WBC 21.9 (*)    MCV 101.3 (*)    Neutro Abs 18.2 (*)    Abs Immature Granulocytes 0.78 (*)    All other components within normal limits  BRAIN NATRIURETIC PEPTIDE - Abnormal; Notable for the following components:   B Natriuretic Peptide 113.9 (*)    All other components within normal limits  BLOOD GAS, VENOUS - Abnormal; Notable for the following components:   pCO2, Ven 89 (*)    Bicarbonate 39.9 (*)    Acid-Base Excess 9.1 (*)    All other components within normal limits  LACTIC ACID, PLASMA - Abnormal; Notable for the following components:   Lactic Acid, Venous 2.6 (*)    All other components within normal limits  BLOOD GAS, VENOUS - Abnormal; Notable for the following components:   pCO2, Ven 63 (*)    pO2, Ven 46 (*)    Bicarbonate 33.2 (*)    Acid-Base Excess  5.7 (*)    All other components within normal limits  TROPONIN I (HIGH SENSITIVITY) - Abnormal; Notable for the following components:   Troponin I (High Sensitivity) 35 (*)    All other components within normal limits  RESP PANEL BY RT-PCR (RSV, FLU A&B, COVID)  RVPGX2  CULTURE, BLOOD (ROUTINE X 2)  CULTURE, BLOOD (ROUTINE X 2)  PROTIME-INR     EKG My EKG interpretation: Rate of 143, sinus tachycardia, normal axis, normal intervals.  No acute ST elevations or depressions.   RADIOLOGY Independently interpreted chest x-ray with no obvious sign of pneumonia and more consistent with COPD   PROCEDURES:  Critical Care performed: Yes, see critical care procedure note(s)  .Critical Care  Performed by: Janith Lima, MD Authorized by: Janith Lima, MD   Critical care provider statement:    Critical care time (minutes):  45   Critical care was time spent personally by me on the following activities:  Development of treatment plan with patient or surrogate, discussions with consultants, evaluation of patient's response to treatment, examination of patient, ordering and review of laboratory studies, ordering and review of radiographic studies, ordering and performing treatments and interventions, pulse oximetry, re-evaluation of patient's condition and review of old charts   I assumed direction of critical care for this patient from another provider in my specialty: no      MEDICATIONS ORDERED IN ED: Medications  albuterol (PROVENTIL) (2.5 MG/3ML) 0.083% nebulizer solution 10 mg (10 mg Nebulization Given by Other 07/17/23 1715)  lactated ringers bolus 1,000 mL (1,000 mLs Intravenous New Bag/Given 07/17/23 1728)  cefTRIAXone (ROCEPHIN) 2 g in sodium chloride 0.9 % 100 mL IVPB (0 g Intravenous Stopped 07/17/23 1804)  azithromycin (ZITHROMAX) 500 mg in sodium chloride 0.9 % 250 mL IVPB (500 mg Intravenous New Bag/Given 07/17/23 1810)  fentaNYL (SUBLIMAZE) injection 50 mcg (50 mcg Intravenous  Given 07/17/23 1848)     IMPRESSION / MDM / ASSESSMENT AND PLAN / ED COURSE  I reviewed the triage vital signs and the nursing notes.                              Differential diagnosis includes, but is not limited to, COPD exacerbation, pneumonia, COVID, flu, RSV, pneumothorax  Patient's presentation is most consistent with acute presentation with potential threat to life or bodily function.  Patient is a 78 year old female presenting  today for fall and shortness of breath.  On arrival she is acutely short of breath and hypoxic on her baseline 3 L.  Transition to BiPAP and given additional hour-long albuterol treatment on top of the multiple treatments she received prior to arrival.  Is able to converse with me and tell me the full story of what is going on right now.  Does not appear altered.  Laboratory workup with slight lactic acidosis at 2.1.  Given her respirations and tachycardia did meet sepsis criteria and was started on IV antibiotics as well as obtain blood cultures treating most likely source of pneumonia.  Given 1 L fluids as well.  WBC at 21.9K.  Initial CO2 on VBG at 89.  Otherwise satting well on BiPAP.  Negative for COVID, flu, RSV.  Chest x-ray without obvious pneumonia present at this time.  X-ray of hip confirms left femoral neck fracture.  Family also confirmed that patient is a DNR/DNI.  Discussed case with orthopedics who is reviewing the images to decide whether she needs to be transferred to a different hospital for further care.  Orthopedics recommending transfer to [facility] for further care of her hip once medically stable. Signed out to oncoming provider pending transfer.  The patient is on the cardiac monitor to evaluate for evidence of arrhythmia and/or significant heart rate changes. Clinical Course as of 07/17/23 1939  Sat Jul 17, 2023  1720 pCO2, Ven(!!): 89 [DW]  1912 Family confirms patient is DNR/DNI [DW]  1926 Orthopedics requesting transfer to [facility].  [DW]    Clinical Course User Index [DW] Janith Lima, MD     FINAL CLINICAL IMPRESSION(S) / ED DIAGNOSES   Final diagnoses:  COPD exacerbation (HCC)  Acute respiratory failure with hypoxia and hypercapnia (HCC)  Left displaced femoral neck fracture (HCC)  Sepsis, due to unspecified organism, unspecified whether acute organ dysfunction present [facility])     Rx / DC Orders   ED Discharge Orders     None        Note:  This document was prepared using Dragon voice recognition software and may include unintentional dictation errors.   Janith Lima, MD 07/17/23 Epimenio Foot    Janith Lima, MD 07/17/23 [phone]

## 2023-07-17 NOTE — ED Notes (Signed)
 Called [facility] @1930  spoke to Rep: Asher Muir to initiate consult for transfer to Hospitalist services gave demographics & diagnosis waiting on call back for providers to discuss patient need for transfer

## 2023-07-18 ENCOUNTER — Emergency Department (HOSPITAL_COMMUNITY)

## 2023-07-18 ENCOUNTER — Inpatient Hospital Stay (HOSPITAL_COMMUNITY)

## 2023-07-18 ENCOUNTER — Encounter (HOSPITAL_COMMUNITY): Payer: Self-pay

## 2023-07-18 DIAGNOSIS — I69391 Dysphagia following cerebral infarction: Secondary | ICD-10-CM | POA: Diagnosis not present

## 2023-07-18 DIAGNOSIS — J9621 Acute and chronic respiratory failure with hypoxia: Secondary | ICD-10-CM | POA: Diagnosis not present

## 2023-07-18 DIAGNOSIS — Z515 Encounter for palliative care: Secondary | ICD-10-CM | POA: Diagnosis not present

## 2023-07-18 DIAGNOSIS — R4182 Altered mental status, unspecified: Secondary | ICD-10-CM

## 2023-07-18 DIAGNOSIS — J90 Pleural effusion, not elsewhere classified: Secondary | ICD-10-CM | POA: Diagnosis not present

## 2023-07-18 DIAGNOSIS — I272 Pulmonary hypertension, unspecified: Secondary | ICD-10-CM | POA: Diagnosis not present

## 2023-07-18 DIAGNOSIS — W19XXXA Unspecified fall, initial encounter: Secondary | ICD-10-CM | POA: Diagnosis present

## 2023-07-18 DIAGNOSIS — I6381 Other cerebral infarction due to occlusion or stenosis of small artery: Secondary | ICD-10-CM | POA: Diagnosis not present

## 2023-07-18 DIAGNOSIS — D62 Acute posthemorrhagic anemia: Secondary | ICD-10-CM | POA: Diagnosis not present

## 2023-07-18 DIAGNOSIS — J9602 Acute respiratory failure with hypercapnia: Secondary | ICD-10-CM | POA: Diagnosis not present

## 2023-07-18 DIAGNOSIS — Z87891 Personal history of nicotine dependence: Secondary | ICD-10-CM | POA: Diagnosis not present

## 2023-07-18 DIAGNOSIS — I10 Essential (primary) hypertension: Secondary | ICD-10-CM | POA: Diagnosis not present

## 2023-07-18 DIAGNOSIS — R410 Disorientation, unspecified: Secondary | ICD-10-CM | POA: Diagnosis not present

## 2023-07-18 DIAGNOSIS — J9622 Acute and chronic respiratory failure with hypercapnia: Secondary | ICD-10-CM | POA: Diagnosis not present

## 2023-07-18 DIAGNOSIS — Z7952 Long term (current) use of systemic steroids: Secondary | ICD-10-CM | POA: Diagnosis not present

## 2023-07-18 DIAGNOSIS — G9341 Metabolic encephalopathy: Secondary | ICD-10-CM | POA: Diagnosis present

## 2023-07-18 DIAGNOSIS — Z7189 Other specified counseling: Secondary | ICD-10-CM | POA: Diagnosis not present

## 2023-07-18 DIAGNOSIS — Z9981 Dependence on supplemental oxygen: Secondary | ICD-10-CM | POA: Diagnosis not present

## 2023-07-18 DIAGNOSIS — R29721 NIHSS score 21: Secondary | ICD-10-CM | POA: Diagnosis not present

## 2023-07-18 DIAGNOSIS — G928 Other toxic encephalopathy: Secondary | ICD-10-CM | POA: Diagnosis not present

## 2023-07-18 DIAGNOSIS — Y92019 Unspecified place in single-family (private) house as the place of occurrence of the external cause: Secondary | ICD-10-CM | POA: Diagnosis not present

## 2023-07-18 DIAGNOSIS — K683 Retroperitoneal hematoma: Secondary | ICD-10-CM | POA: Diagnosis present

## 2023-07-18 DIAGNOSIS — R569 Unspecified convulsions: Secondary | ICD-10-CM

## 2023-07-18 DIAGNOSIS — J441 Chronic obstructive pulmonary disease with (acute) exacerbation: Secondary | ICD-10-CM

## 2023-07-18 DIAGNOSIS — S72002A Fracture of unspecified part of neck of left femur, initial encounter for closed fracture: Secondary | ICD-10-CM

## 2023-07-18 DIAGNOSIS — Z79899 Other long term (current) drug therapy: Secondary | ICD-10-CM | POA: Diagnosis not present

## 2023-07-18 DIAGNOSIS — I700 Atherosclerosis of aorta: Secondary | ICD-10-CM | POA: Diagnosis not present

## 2023-07-18 DIAGNOSIS — J9601 Acute respiratory failure with hypoxia: Secondary | ICD-10-CM | POA: Diagnosis not present

## 2023-07-18 DIAGNOSIS — Z96612 Presence of left artificial shoulder joint: Secondary | ICD-10-CM | POA: Diagnosis not present

## 2023-07-18 DIAGNOSIS — N281 Cyst of kidney, acquired: Secondary | ICD-10-CM | POA: Diagnosis not present

## 2023-07-18 DIAGNOSIS — I6389 Other cerebral infarction: Secondary | ICD-10-CM | POA: Diagnosis not present

## 2023-07-18 DIAGNOSIS — D649 Anemia, unspecified: Secondary | ICD-10-CM | POA: Diagnosis present

## 2023-07-18 DIAGNOSIS — Z01810 Encounter for preprocedural cardiovascular examination: Secondary | ICD-10-CM | POA: Diagnosis not present

## 2023-07-18 DIAGNOSIS — E538 Deficiency of other specified B group vitamins: Secondary | ICD-10-CM | POA: Diagnosis not present

## 2023-07-18 DIAGNOSIS — N9489 Other specified conditions associated with female genital organs and menstrual cycle: Secondary | ICD-10-CM | POA: Diagnosis not present

## 2023-07-18 DIAGNOSIS — S72092A Other fracture of head and neck of left femur, initial encounter for closed fracture: Secondary | ICD-10-CM | POA: Diagnosis not present

## 2023-07-18 DIAGNOSIS — R000 Tachycardia, unspecified: Secondary | ICD-10-CM | POA: Diagnosis present

## 2023-07-18 DIAGNOSIS — J449 Chronic obstructive pulmonary disease, unspecified: Secondary | ICD-10-CM | POA: Diagnosis not present

## 2023-07-18 DIAGNOSIS — F418 Other specified anxiety disorders: Secondary | ICD-10-CM | POA: Diagnosis not present

## 2023-07-18 DIAGNOSIS — I2510 Atherosclerotic heart disease of native coronary artery without angina pectoris: Secondary | ICD-10-CM | POA: Diagnosis not present

## 2023-07-18 DIAGNOSIS — M80052A Age-related osteoporosis with current pathological fracture, left femur, initial encounter for fracture: Secondary | ICD-10-CM | POA: Diagnosis not present

## 2023-07-18 DIAGNOSIS — J439 Emphysema, unspecified: Secondary | ICD-10-CM | POA: Diagnosis not present

## 2023-07-18 DIAGNOSIS — E872 Acidosis, unspecified: Secondary | ICD-10-CM | POA: Diagnosis not present

## 2023-07-18 DIAGNOSIS — Z66 Do not resuscitate: Secondary | ICD-10-CM | POA: Diagnosis not present

## 2023-07-18 DIAGNOSIS — S79912A Unspecified injury of left hip, initial encounter: Secondary | ICD-10-CM | POA: Diagnosis present

## 2023-07-18 DIAGNOSIS — I63431 Cerebral infarction due to embolism of right posterior cerebral artery: Secondary | ICD-10-CM | POA: Diagnosis not present

## 2023-07-18 DIAGNOSIS — E785 Hyperlipidemia, unspecified: Secondary | ICD-10-CM | POA: Diagnosis not present

## 2023-07-18 DIAGNOSIS — S72012A Unspecified intracapsular fracture of left femur, initial encounter for closed fracture: Secondary | ICD-10-CM | POA: Diagnosis not present

## 2023-07-18 DIAGNOSIS — I739 Peripheral vascular disease, unspecified: Secondary | ICD-10-CM | POA: Diagnosis not present

## 2023-07-18 DIAGNOSIS — S32591A Other specified fracture of right pubis, initial encounter for closed fracture: Secondary | ICD-10-CM | POA: Diagnosis present

## 2023-07-18 LAB — COMPREHENSIVE METABOLIC PANEL WITH GFR
ALT: 23 U/L (ref 0–44)
AST: 19 U/L (ref 15–41)
Albumin: 2.2 g/dL — ABNORMAL LOW (ref 3.5–5.0)
Alkaline Phosphatase: 35 U/L — ABNORMAL LOW (ref 38–126)
Anion gap: 7 (ref 5–15)
BUN: 17 mg/dL (ref 8–23)
CO2: 31 mmol/L (ref 22–32)
Calcium: 8 mg/dL — ABNORMAL LOW (ref 8.9–10.3)
Chloride: 98 mmol/L (ref 98–111)
Creatinine, Ser: 0.8 mg/dL (ref 0.44–1.00)
GFR, Estimated: >60 mL/min (ref >60)
Glucose, Bld: 217 mg/dL — ABNORMAL HIGH (ref 70–99)
Potassium: 4.3 mmol/L (ref 3.5–5.1)
Sodium: 136 mmol/L (ref 135–145)
Total Bilirubin: 0.5 mg/dL (ref 0.0–1.2)
Total Protein: 4.5 g/dL — ABNORMAL LOW (ref 6.5–8.1)

## 2023-07-18 LAB — I-STAT VENOUS BLOOD GAS, ED
Acid-Base Excess: 6 mmol/L — ABNORMAL HIGH (ref 0.0–2.0)
Acid-Base Excess: 7 mmol/L — ABNORMAL HIGH (ref 0.0–2.0)
Bicarbonate: 34.8 mmol/L — ABNORMAL HIGH (ref 20.0–28.0)
Bicarbonate: 35.9 mmol/L — ABNORMAL HIGH (ref 20.0–28.0)
Calcium, Ion: 1.15 mmol/L (ref 1.15–1.40)
Calcium, Ion: 1.18 mmol/L (ref 1.15–1.40)
HCT: 28 % — ABNORMAL LOW (ref 36.0–46.0)
HCT: 36 % (ref 36.0–46.0)
Hemoglobin: 12.2 g/dL (ref 12.0–15.0)
Hemoglobin: 9.5 g/dL — ABNORMAL LOW (ref 12.0–15.0)
O2 Saturation: 49 %
O2 Saturation: 50 %
Potassium: 4.4 mmol/L (ref 3.5–5.1)
Potassium: 4.7 mmol/L (ref 3.5–5.1)
Sodium: 138 mmol/L (ref 135–145)
Sodium: 138 mmol/L (ref 135–145)
TCO2: 37 mmol/L — ABNORMAL HIGH (ref 22–32)
TCO2: 38 mmol/L — ABNORMAL HIGH (ref 22–32)
pCO2, Ven: 73.8 mmHg (ref 44–60)
pCO2, Ven: 79.1 mmHg (ref 44–60)
pH, Ven: 7.251 (ref 7.25–7.43)
pH, Ven: 7.295 (ref 7.25–7.43)
pO2, Ven: 31 mmHg — CL (ref 32–45)
pO2, Ven: 32 mmHg (ref 32–45)

## 2023-07-18 LAB — CBC
HCT: 37.2 % (ref 36.0–46.0)
HCT: 33.9 % — ABNORMAL LOW (ref 36.0–46.0)
HCT: 33.3 % — ABNORMAL LOW (ref 36.0–46.0)
Hemoglobin: 11.6 g/dL — ABNORMAL LOW (ref 12.0–15.0)
Hemoglobin: 10.7 g/dL — ABNORMAL LOW (ref 12.0–15.0)
Hemoglobin: 10.5 g/dL — ABNORMAL LOW (ref 12.0–15.0)
MCH: 30.2 pg (ref 26.0–34.0)
MCH: 29.8 pg (ref 26.0–34.0)
MCH: 30.2 pg (ref 26.0–34.0)
MCHC: 31.2 g/dL (ref 30.0–36.0)
MCHC: 31.6 g/dL (ref 30.0–36.0)
MCHC: 31.5 g/dL (ref 30.0–36.0)
MCV: 96.9 fL (ref 80.0–100.0)
MCV: 94.4 fL (ref 80.0–100.0)
MCV: 95.7 fL (ref 80.0–100.0)
Platelets: 206 10*3/uL (ref 150–400)
Platelets: 182 10*3/uL (ref 150–400)
Platelets: 169 10*3/uL (ref 150–400)
RBC: 3.84 MIL/uL — ABNORMAL LOW (ref 3.87–5.11)
RBC: 3.59 MIL/uL — ABNORMAL LOW (ref 3.87–5.11)
RBC: 3.48 MIL/uL — ABNORMAL LOW (ref 3.87–5.11)
RDW: 15.2 % (ref 11.5–15.5)
RDW: 15.5 % (ref 11.5–15.5)
RDW: 15.7 % — ABNORMAL HIGH (ref 11.5–15.5)
WBC: 29.3 10*3/uL — ABNORMAL HIGH (ref 4.0–10.5)
WBC: 24.9 10*3/uL — ABNORMAL HIGH (ref 4.0–10.5)
WBC: 24.3 10*3/uL — ABNORMAL HIGH (ref 4.0–10.5)
nRBC: 0 % (ref 0.0–0.2)
nRBC: 0 % (ref 0.0–0.2)
nRBC: 0.2 % (ref 0.0–0.2)

## 2023-07-18 LAB — CBC WITH DIFFERENTIAL/PLATELET
Abs Immature Granulocytes: 0.2 10*3/uL — ABNORMAL HIGH (ref 0.00–0.07)
Basophils Absolute: 0.1 10*3/uL (ref 0.0–0.1)
Basophils Relative: 0 %
Eosinophils Absolute: 0 10*3/uL (ref 0.0–0.5)
Eosinophils Relative: 0 %
HCT: 29.2 % — ABNORMAL LOW (ref 36.0–46.0)
Hemoglobin: 9 g/dL — ABNORMAL LOW (ref 12.0–15.0)
Immature Granulocytes: 1 %
Lymphocytes Relative: 1 %
Lymphs Abs: 0.2 10*3/uL — ABNORMAL LOW (ref 0.7–4.0)
MCH: 31 pg (ref 26.0–34.0)
MCHC: 30.8 g/dL (ref 30.0–36.0)
MCV: 100.7 fL — ABNORMAL HIGH (ref 80.0–100.0)
Monocytes Absolute: 1 10*3/uL (ref 0.1–1.0)
Monocytes Relative: 3 %
Neutro Abs: 27.1 10*3/uL — ABNORMAL HIGH (ref 1.7–7.7)
Neutrophils Relative %: 95 %
Platelets: 200 10*3/uL (ref 150–400)
RBC: 2.9 MIL/uL — ABNORMAL LOW (ref 3.87–5.11)
RDW: 14.1 % (ref 11.5–15.5)
WBC: 28.5 10*3/uL — ABNORMAL HIGH (ref 4.0–10.5)
nRBC: 0 % (ref 0.0–0.2)

## 2023-07-18 LAB — BLOOD GAS, ARTERIAL
Acid-Base Excess: 5.2 mmol/L — ABNORMAL HIGH (ref 0.0–2.0)
Bicarbonate: 32.6 mmol/L — ABNORMAL HIGH (ref 20.0–28.0)
O2 Saturation: 96.8 %
Patient temperature: 36.8
pCO2 arterial: 58 mmHg — ABNORMAL HIGH (ref 32–48)
pH, Arterial: 7.35 (ref 7.35–7.45)
pO2, Arterial: 79 mmHg — ABNORMAL LOW (ref 83–108)

## 2023-07-18 LAB — LACTIC ACID, PLASMA: Lactic Acid, Venous: 1.9 mmol/L (ref 0.5–1.9)

## 2023-07-18 LAB — CBG MONITORING, ED: Glucose-Capillary: 185 mg/dL — ABNORMAL HIGH (ref 70–99)

## 2023-07-18 LAB — PROTIME-INR
INR: 1.1 (ref 0.8–1.2)
Prothrombin Time: 14.7 s (ref 11.4–15.2)

## 2023-07-18 LAB — HEMOGLOBIN A1C
Hgb A1c MFr Bld: 5.7 % — ABNORMAL HIGH (ref 4.8–5.6)
Mean Plasma Glucose: 116.89 mg/dL

## 2023-07-18 LAB — GLUCOSE, CAPILLARY: Glucose-Capillary: 150 mg/dL — ABNORMAL HIGH (ref 70–99)

## 2023-07-18 LAB — VITAMIN B12: Vitamin B-12: 162 pg/mL — ABNORMAL LOW (ref 180–914)

## 2023-07-18 LAB — T4, FREE: Free T4: 0.91 ng/dL (ref 0.61–1.12)

## 2023-07-18 LAB — PREPARE RBC (CROSSMATCH)

## 2023-07-18 LAB — AMMONIA: Ammonia: 29 umol/L (ref 9–35)

## 2023-07-18 LAB — FOLATE: Folate: 13.8 ng/mL (ref >5.9)

## 2023-07-18 LAB — PROCALCITONIN: Procalcitonin: 1.72 ng/mL

## 2023-07-18 LAB — TSH: TSH: 0.374 u[IU]/mL (ref 0.350–4.500)

## 2023-07-18 MED ORDER — ONDANSETRON HCL 4 MG PO TABS: 4.0000 mg | ORAL_TABLET | Freq: Four times a day (QID) | ORAL | Status: DC | PRN

## 2023-07-18 MED ORDER — POLYETHYLENE GLYCOL 3350 17 G PO PACK: 17.0000 g | PACK | Freq: Every day | ORAL | Status: DC | PRN

## 2023-07-18 MED ORDER — THEOPHYLLINE ER 100 MG PO CP24: 100.0000 mg | ORAL_CAPSULE | Freq: Every day | ORAL | Status: DC

## 2023-07-18 MED ORDER — BUDESONIDE 0.25 MG/2ML IN SUSP
0.2500 mg | Freq: Two times a day (BID) | RESPIRATORY_TRACT | Status: DC
  Administered 2023-07-18 – 2023-07-21 (×6): 0.25 mg via RESPIRATORY_TRACT
  Filled 2023-07-18 (×6): qty 2

## 2023-07-18 MED ORDER — SODIUM CHLORIDE 0.9 % IV SOLN
2.0000 g | INTRAVENOUS | Status: DC
  Administered 2023-07-19 – 2023-07-20 (×3): 2 g via INTRAVENOUS
  Filled 2023-07-18 (×3): qty 20

## 2023-07-18 MED ORDER — HYDROMORPHONE HCL 1 MG/ML IJ SOLN
0.5000 mg | INTRAMUSCULAR | Status: AC
  Administered 2023-07-18: 0.5 mg via INTRAVENOUS
  Filled 2023-07-18: qty 1

## 2023-07-18 MED ORDER — PROCHLORPERAZINE EDISYLATE 10 MG/2ML IJ SOLN: 5.0000 mg | Freq: Four times a day (QID) | INTRAMUSCULAR | Status: DC | PRN

## 2023-07-18 MED ORDER — BUSPIRONE HCL 5 MG PO TABS: 10.0000 mg | ORAL_TABLET | Freq: Two times a day (BID) | ORAL | Status: DC

## 2023-07-18 MED ORDER — HYDROMORPHONE HCL 1 MG/ML IJ SOLN: 0.5000 mg | INTRAMUSCULAR | Status: DC | PRN

## 2023-07-18 MED ORDER — KETOROLAC TROMETHAMINE 15 MG/ML IJ SOLN
15.0000 mg | Freq: Four times a day (QID) | INTRAMUSCULAR | Status: DC
  Administered 2023-07-18 – 2023-07-19 (×4): 15 mg via INTRAVENOUS
  Filled 2023-07-18 (×4): qty 1

## 2023-07-18 MED ORDER — ESCITALOPRAM OXALATE 10 MG PO TABS: 10.0000 mg | ORAL_TABLET | Freq: Every day | ORAL | Status: DC

## 2023-07-18 MED ORDER — AMITRIPTYLINE HCL 50 MG PO TABS: 25.0000 mg | ORAL_TABLET | Freq: Every day | ORAL | Status: DC

## 2023-07-18 MED ORDER — METOPROLOL TARTRATE 5 MG/5ML IV SOLN
2.5000 mg | Freq: Once | INTRAVENOUS | Status: AC
  Administered 2023-07-18: 2.5 mg via INTRAVENOUS
  Filled 2023-07-18: qty 5

## 2023-07-18 MED ORDER — TRAMADOL HCL 50 MG PO TABS: 50.0000 mg | ORAL_TABLET | Freq: Four times a day (QID) | ORAL | Status: DC | PRN

## 2023-07-18 MED ORDER — DOCUSATE SODIUM 100 MG PO CAPS: 100.0000 mg | ORAL_CAPSULE | Freq: Two times a day (BID) | ORAL | Status: DC

## 2023-07-18 MED ORDER — METHYLPREDNISOLONE SODIUM SUCC 40 MG IJ SOLR
40.0000 mg | Freq: Every day | INTRAMUSCULAR | Status: DC
  Administered 2023-07-19 – 2023-07-20 (×2): 40 mg via INTRAVENOUS
  Filled 2023-07-18 (×2): qty 1

## 2023-07-18 MED ORDER — ROFLUMILAST 500 MCG PO TABS
250.0000 ug | ORAL_TABLET | Freq: Every day | ORAL | Status: DC
  Filled 2023-07-18: qty 1

## 2023-07-18 MED ORDER — IOHEXOL 350 MG/ML SOLN
75.0000 mL | Freq: Once | INTRAVENOUS | Status: AC | PRN
  Administered 2023-07-18: 75 mL via INTRAVENOUS

## 2023-07-18 MED ORDER — INSULIN ASPART 100 UNIT/ML IJ SOLN
0.0000 [IU] | INTRAMUSCULAR | Status: DC
  Administered 2023-07-18: 2 [IU] via SUBCUTANEOUS
  Administered 2023-07-18 (×2): 1 [IU] via SUBCUTANEOUS
  Administered 2023-07-18: 2 [IU] via SUBCUTANEOUS
  Administered 2023-07-19 (×3): 1 [IU] via SUBCUTANEOUS
  Administered 2023-07-19 – 2023-07-20 (×3): 2 [IU] via SUBCUTANEOUS
  Administered 2023-07-20 (×3): 1 [IU] via SUBCUTANEOUS
  Administered 2023-07-21 (×3): 2 [IU] via SUBCUTANEOUS

## 2023-07-18 MED ORDER — SODIUM CHLORIDE 0.9% IV SOLUTION
Freq: Once | INTRAVENOUS | Status: AC
  Administered 2023-07-18: 10 mL/h via INTRAVENOUS

## 2023-07-18 MED ORDER — ALPRAZOLAM 0.25 MG PO TABS: 0.1250 mg | ORAL_TABLET | Freq: Two times a day (BID) | ORAL | Status: DC | PRN

## 2023-07-18 MED ORDER — LACTATED RINGERS IV SOLN: INTRAVENOUS | Status: DC

## 2023-07-18 MED ORDER — SODIUM CHLORIDE 0.9 % IV SOLN
100.0000 mg | Freq: Two times a day (BID) | INTRAVENOUS | Status: DC
  Administered 2023-07-18 – 2023-07-20 (×6): 100 mg via INTRAVENOUS
  Filled 2023-07-18 (×9): qty 100

## 2023-07-18 MED ORDER — ACETAMINOPHEN 650 MG RE SUPP
650.0000 mg | Freq: Four times a day (QID) | RECTAL | Status: DC | PRN
  Administered 2023-07-18: 650 mg via RECTAL
  Filled 2023-07-18: qty 1

## 2023-07-18 MED ORDER — METHYLPREDNISOLONE SODIUM SUCC 40 MG IJ SOLR
40.0000 mg | Freq: Two times a day (BID) | INTRAMUSCULAR | Status: DC
  Administered 2023-07-18: 40 mg via INTRAVENOUS
  Filled 2023-07-18: qty 1

## 2023-07-18 MED ORDER — ACETAMINOPHEN 325 MG PO TABS: 650.0000 mg | ORAL_TABLET | Freq: Four times a day (QID) | ORAL | Status: DC | PRN

## 2023-07-18 MED ORDER — METOPROLOL TARTRATE 5 MG/5ML IV SOLN
2.5000 mg | Freq: Four times a day (QID) | INTRAVENOUS | Status: DC | PRN
  Administered 2023-07-18 – 2023-07-19 (×3): 2.5 mg via INTRAVENOUS
  Filled 2023-07-18 (×3): qty 5

## 2023-07-18 MED ORDER — BUDESONIDE 0.25 MG/2ML IN SUSP: 0.2500 mg | Freq: Two times a day (BID) | RESPIRATORY_TRACT | Status: DC

## 2023-07-18 MED ORDER — REVEFENACIN 175 MCG/3ML IN SOLN
175.0000 ug | Freq: Every day | RESPIRATORY_TRACT | Status: DC
  Administered 2023-07-18 – 2023-07-21 (×4): 175 ug via RESPIRATORY_TRACT
  Filled 2023-07-18 (×4): qty 3

## 2023-07-18 MED ORDER — ARFORMOTEROL TARTRATE 15 MCG/2ML IN NEBU
15.0000 ug | INHALATION_SOLUTION | Freq: Two times a day (BID) | RESPIRATORY_TRACT | Status: DC
  Administered 2023-07-18 – 2023-07-21 (×6): 15 ug via RESPIRATORY_TRACT
  Filled 2023-07-18 (×6): qty 2

## 2023-07-18 MED ORDER — ONDANSETRON HCL 4 MG/2ML IJ SOLN
4.0000 mg | Freq: Four times a day (QID) | INTRAMUSCULAR | Status: DC | PRN
Start: 2023-07-18 — End: 2023-07-21

## 2023-07-18 MED ORDER — FENTANYL CITRATE PF 50 MCG/ML IJ SOSY
25.0000 ug | PREFILLED_SYRINGE | Freq: Once | INTRAMUSCULAR | Status: AC
  Administered 2023-07-18: 25 ug via INTRAVENOUS
  Filled 2023-07-18: qty 1

## 2023-07-18 MED ORDER — ARFORMOTEROL TARTRATE 15 MCG/2ML IN NEBU: 15.0000 ug | INHALATION_SOLUTION | Freq: Two times a day (BID) | RESPIRATORY_TRACT | Status: DC

## 2023-07-18 MED ORDER — HYDROMORPHONE HCL 1 MG/ML IJ SOLN
0.5000 mg | INTRAMUSCULAR | Status: DC | PRN
  Administered 2023-07-18 – 2023-07-19 (×5): 0.5 mg via INTRAVENOUS
  Filled 2023-07-18 (×5): qty 0.5

## 2023-07-18 MED ORDER — METHYLPREDNISOLONE SODIUM SUCC 40 MG IJ SOLR: 40.0000 mg | Freq: Two times a day (BID) | INTRAMUSCULAR | Status: DC

## 2023-07-18 NOTE — Hospital Course (Signed)
 PMH of severe COPD, chronic respiratory failure, CAD, GERD, HLD, HTN presented to the hospital with complaints of a fall.  After the fall the patient has remained confused and currently being treated for acute metabolic encephalopathy. Secondary to fall is found to have subcapital fracture neck of left femur, bilateral pubic ramus fracture as well as fracture of the right side of the pubic symphysis along with extraperitoneal hematoma around the pubic symphysis, 3.5 cm x 11.6 cm x 9.3 cm hematoma adjacent to the anterior aspect of the urinary bladder.  Trauma surgery, orthopedic surgery, pulmonary consulted. MRI brain came back positive for punctate infarct, neurology consulted.  Assessment and Plan: Subcapital fracture of neck of left femur Bilateral pubic rami fractures, closed, initial encounter The patient presented with a mechanical fall. She appears to have sustained multiple fractures including bilateral pubic rami fracture as well as involvement of the pubic symphysis and subtotal fracture of the left femur. Patient originally presented to [facility] ED.  From there after consultation with the orthopedic surgery was transferred to [facility]. Dr. Steward Drone and Dr. Roda Shutters are consulted. Surgery currently understand the patient will be moderate to high risk most likely for respiratory outcomes.  As of right now no prohibitive risk for the surgery.  Hematoma of extraperitoneal space Appreciate trauma surgery consultation. Monitoring H&H.  Reassuringly hemoglobin is stable. Patient received 1 PRBC transfusion already. Transfuse hemoglobin less than 8 or hemodynamic instability.  Acute metabolic encephalopathy Acute punctate infarct. B12 deficiency Mentation is significantly altered from baseline. At baseline able to care for self ambulates with a walker. Currently unable to verbalize, unable to follow commands.  Appears to be severely delirious. CT of the head is unremarkable for any acute  abnormality. Does not appear that the suffering from any focal deficit. MRI brain ordered.  Which showed evidence of punctate acute infarct within the right frontal white matter and left thalamus.  Neurology consulted. EEG negative for any active seizures. ABG while shows hypercarbia does not show any evidence of decompensation.  Folate 13.8, B12 162, ammonia 29, free T4 0.9, TSH 0.37. Check B1 and initiate B12 therapy. Neurology consulted for acute stroke as well as for encephalopathy. At present neurology recommending to ensure that the blood pressure remains stable perioperatively but currently no prohibitive risk for the surgery for spinal anesthesia.  Preoperative medical evaluation Nolon Nations al. Estimated Risk Probability for Perioperative Myocardial Infarction or Cardiac Arrest: 1.2%  Pt can climb a flight of stair, ambulates around house using walker device, without any shortness of breath or chest pain. Stress test in 2021  negative for Has h/o severe COPD on 3 LPM Based on RCRI 30-day risk of death, MI, or cardiac arrest is 6 % Perioperative respiratory failure rate requiring prolonged ventilation 5 to 10% Echocardiogram ordered and pending, although given negative stress test in 2021, would not pose any prohibitive risk for the surgery.  Long discussion with husband and daughter at bedside, updated them about the new information of stroke and increased risk of stroke perioperatively.  They understands patient will be at moderate to high risk for the surgical procedure. Alternative surgical intervention were discussed. They accepts all risks and wants to proceed with surgical intervention.   COPD (chronic obstructive pulmonary disease) Chronic respiratory failure with hypoxia and hypercapnia COPD exacerbation (HCC) Procalcitonin is mildly elevated. Patient is with severe respite distress, currently requiring BiPAP therapy. Will treat as presumed COPD exacerbation with IV  antibiotics, IV steroids and nebulization therapy and monitor. Appreciate pulmonary assistance.  Sinus tachycardia Likely secondary to pain. Echocardiogram ordered and pending.  Lopressor as needed. TSH and free T4 normal. Hemoglobin remained stable.  Fall Appears to be mechanical based on the history provided by husband.  She was trying to walk into her bathroom with a walker and then switched to cane and had a fall. Monitor  Urinary retention Likely secondary combination of delirium as well as hematoma.  Foley catheter inserted. Monitor.  Major depression, chronic On BuSpar, Xanax, Lexapro Monitor for now, judicious use of IV Ativan as needed.  Essential hypertension Blood pressure is mildly elevated. On Lopressor as needed.  Hyperlipidemia Continue statin once able to take p.o.  CAD (coronary artery disease), native coronary artery Stress test in 2021 was negative for any acute ischemia. CT chest shows moderate calcification in coronary arteries.  Normocytic anemia H&H relatively stable.  Received 1 PRBC transfusion in ER. Monitor.  Goals of care conversation. DNR present on admission Discussed with husband.  Patient remains DNR/DNI.Marland Kitchen Prognosis is very poor regardless of the decision given high risk for prolong ventilator needs after surgery as well as severe delirium before the surgery. Will monitor.  Leukocytosis Likely stress reaction. For now we will monitor On antibiotics.  Lactic acidosis Currently receiving IV fluid. Monitor with  {Problem list (Optional):1}

## 2023-07-18 NOTE — Progress Notes (Signed)
 Patient is responding to voice, opens eyes but unable to follow commands, couldn't speak, HR in 130s, spo2 maintained >90% via [facility] 4 ltrs, patient hasn't voided yet, bladder scan showed , informed to the MD   1030 foley inserted, MD was at bedside.

## 2023-07-18 NOTE — Progress Notes (Signed)
 Pt transported from ED room 22 to CT on BIPAP without any complications.

## 2023-07-18 NOTE — Consult Note (Signed)
 NAME:  Joyce Rodriguez, MRN:  [id number], DOB:  03/22/1946, LOS: 0 ADMISSION DATE:  07/17/2023, CONSULTATION DATE:  07/18/2023 REFERRING MD:  Rolly Salter, MD, CHIEF COMPLAINT:  preoperative pulmonary evaluation   History of Present Illness:  Joyce Rodriguez is a 78 year old woman with past medical history of COPD on home oxygen 3 L nasal cannula, hypertension, chronic anxiety and depression who presents to the hospital after a mechanical fall.  She was brought to the ED by EMS and found to be hypoxemic and respiratory distress.  Further evaluation found pelvic and hip fractures with pelvic hematoma.  She has received some blood transfusion for anemia related to this pelvic hematoma.  COPD regimen includes Breztri, flu the last, home oxygen, it appears she is also on prednisone 10 mg daily.  Pulmonary is consulted to evaluate preoperative pulmonary evaluation in this patient with hip fracture requiring urgent left hemiarthroplasty.  She is nonverbal, tachycardic moaning this afternoon. Last received any pain meds at 5:30 this morning.   Pertinent  Medical History  Hypertension, COPD, hyperlipidemia, anxiety and depression, GERD  Significant Hospital Events: Including procedures, antibiotic start and stop dates in addition to other pertinent events     Interim History / Subjective:    Objective   Blood pressure (!) 132/93, pulse (!) 131, temperature 98.2 F (36.8 C), temperature source Axillary, resp. rate (!) 23, height 5\' 6"  (1.676 m), weight 68 kg, SpO2 95%.    FiO2 (%):  [35 %-45 %] 35 %   Intake/Output Summary (Last 24 hours) at 07/18/2023 1253 Last data filed at 07/18/2023 1132 Gross per 24 hour  Intake 315 ml  Output 700 ml  Net -385 ml   Filed Weights   07/17/23 2356  Weight: 68 kg    Examination: General: elderly, frail, on  nasal cannula HENT: mmm Lungs: diminished, no wheezes, mildly tachypnic Cardiovascular: tachycardic to 140s, regular Abdomen: soft Extremities: thin,  no edema Neuro: moaning, not able to wake up, track, or respond and follow commands   Labs and imaging personally reviewed CT PE study from this admission negative for PE, diffuse upper lip with dominant panlobular emphysema  CBC shows white count of 29.3, hemoglobin 11.6 after transfusion CT pelvis shows bilateral pubic rami fracture, x-ray shows left femoral neck fracture Resolved Hospital Problem list     Assessment & Plan:   Acute on chronic hypoxemic and hypercapnic respiratory failure Very severe COPD on home oxygen, FEV1 25% of predicted Left femoral neck fracture with pelvic ramus fracture and pelvic hematoma  She is incredibly frail on exam and high risk based on urgency of the procedure, age, chronic respiratory failure.   She is high risk for postoperative respiratory failure in the setting of general anesthesia for a left hemiarthroplasty. Would imagine that spinal anesthesia would be a safer alternative compared to general anesthesia. I am also concerned about her chronic prednisone use in the setting of wound healing.  This is currently being held.  No clinical evidence of adrenal insufficiency chronically. Her PE study was negative.   Continue nebulizer treatments with budesonide, arformoterol, and Yupelri.  She is on Roflumilast at home which is reasonable to hold here.  This is a preoperative pulmonary evaluation for Joyce Rodriguez for left hip arthroplasty, urgent in the setting of mechanical fall.  ASSESSMENT: Joyce Rodriguez has a high risk of post-operative pulmonary complications by ARISCAT Index.  The absolute assessment of risk/benefit of the procedure is deferred to the primary team's evaluation.  RECOMMENDATIONS:  In order to minimize the risk of complications and optimize pulmonary status, we recommend the following: - Encourage aggressive incentive spirometry hourly both peri-operatively and post-operatively as tolerated  - Early ambulation and physical  therapy as tolerated post-operatively - Adequate pain control especially in the setting of abdominal and thoracic surgery - Bronchodilators as needed for wheezing or shortness of breath - Intraoperatively keep OR time to the shortest as possible  Preoperative Risk Calculation: Postoperative respiratory failure (PRF) is considered as failure to wean from mechanical ventilation within 48 hours of surgery or unplanned intubation/reintubation postoperatively. The validated risk calculator provides a risk estimate of PRF and is anticipated to aid in surgical decision-making and informed patient consent.  However risk can be accepted given the potential benefit of this intervention and it is not prohibitive.  The features of this patient's history that contribute to the pulmonary risk assessment include:  Age, COPD,   0 to 25 points: Low risk: 1.6% pulmonary complication rate  26 to 44 points: Intermediate risk: 13.3% pulmonary complication rate  45 to 123 points: High risk: 42.1% pulmonary complication rate   ARISCAT: Mazo et al. Anesthesiology 2014; [id number]  Durel Salts, MD Pulmonary and Critical Care Medicine [facility] 07/18/2023 1:05 PM Pager: see AMION  If no response to pager, please call critical care on call (see AMION) until 7pm After 7:00 pm call Elink      Labs   CBC: Recent Labs  Lab 07/17/23 1707 07/18/23 0028 07/18/23 0035 07/18/23 0342 07/18/23 0349  WBC 21.9* 28.5*  --  29.3*  --   NEUTROABS 18.2* 27.1*  --   --   --   HGB 12.0 9.0* 9.5* 11.6* 12.2  HCT 39.2 29.2* 28.0* 37.2 36.0  MCV 101.3* 100.7*  --  96.9  --   PLT 307 200  --  206  --     Basic Metabolic Panel: Recent Labs  Lab 07/17/23 1707 07/18/23 0028 07/18/23 0035 07/18/23 0349  NA 139 136 138 138  K 4.1 4.3 4.4 4.7  CL 97* 98  --   --   CO2 34* 31  --   --   GLUCOSE 246* 217*  --   --   BUN 17 17  --   --   CREATININE 0.60 0.80  --   --   CALCIUM 8.9 8.0*  --   --     GFR: Estimated Creatinine Clearance: 56 mL/min (by C-G formula based on SCr of 0.8 mg/dL). Recent Labs  Lab 07/17/23 1707 07/17/23 1855 07/17/23 1907 07/18/23 0028 07/18/23 0342 07/18/23 0529  PROCALCITON  --   --   --  1.72  --   --   WBC 21.9*  --   --  28.5* 29.3*  --   LATICACIDVEN  --  2.6* 2.1*  --   --  1.9    Liver Function Tests: Recent Labs  Lab 07/17/23 1707 07/18/23 0028  AST 23 19  ALT 27 23  ALKPHOS 54 35*  BILITOT 0.5 0.5  PROT 6.4* 4.5*  ALBUMIN 3.5 2.2*   No results for input(s): "LIPASE", "AMYLASE" in the last 168 hours. No results for input(s): "AMMONIA" in the last 168 hours.  ABG    Component Value Date/Time   PHART 7.35 07/18/2023 1045   PCO2ART 58 (H) 07/18/2023 1045   PO2ART 79 (L) 07/18/2023 1045   HCO3 32.6 (H) 07/18/2023 1045   TCO2 38 (H) 07/18/2023 0349   O2SAT 96.8 07/18/2023 1045  Coagulation Profile: Recent Labs  Lab 07/17/23 1707 07/18/23 0028  INR 1.1 1.1    Cardiac Enzymes: No results for input(s): "CKTOTAL", "CKMB", "CKMBINDEX", "TROPONINI" in the last 168 hours.  HbA1C: Hgb A1c MFr Bld  Date/Time Value Ref Range Status  07/18/2023 03:42 AM 5.7 (H) 4.8 - 5.6 % Final    Comment:    (NOTE) Pre diabetes:          5.7%-6.4%  Diabetes:              >6.4%  Glycemic control for   <7.0% adults with diabetes     CBG: Recent Labs  Lab 07/18/23 0605  GLUCAP 185*    Review of Systems:   As above in HPI  Past Medical History:  She,  has a past medical history of Aortic atherosclerosis (HCC), Arthritis, Complication of anesthesia, COPD (chronic obstructive pulmonary disease) (HCC), Coronary artery disease, Dyspnea, Emphysema of lung (HCC), GERD (gastroesophageal reflux disease), Headache, History of hiatal hernia, Hyperlipidemia, Hypertension, Osteoporosis, Oxygen deficiency, Oxygen dependent, Pneumonia, and Umbilical hernia.   Surgical History:   Past Surgical History:  Procedure Laterality Date    ABDOMINAL HYSTERECTOMY     CHOLECYSTECTOMY     ESOPHAGEAL MANOMETRY N/A 09/13/2017   Procedure: ESOPHAGEAL MANOMETRY (EM);  Surgeon: Napoleon Form, MD;  Location: [facility];  Service: Endoscopy;  Laterality: N/A;   ESOPHAGOGASTRODUODENOSCOPY (EGD) WITH PROPOFOL N/A 05/11/2017   Procedure: ESOPHAGOGASTRODUODENOSCOPY (EGD) WITH PROPOFOL;  Surgeon: Toney Reil, MD;  Location: [facility] ENDOSCOPY;  Service: Gastroenterology;  Laterality: N/A;   EYE SURGERY Bilateral    CATARACTS   FOOT SURGERY     MORTONS NEUROMA   LAPAROSCOPIC NISSEN FUNDOPLICATION N/A 10/19/2017   Procedure: LAPAROSCOPIC NISSEN FUNDOPLICATION;  Surgeon: Leafy Ro, MD;  Location: [facility] ORS;  Service: General;  Laterality: N/A;   NISSEN FUNDOPLICATION N/A 10/19/2017   Procedure: NISSEN FUNDOPLICATION;  Surgeon: Leafy Ro, MD;  Location: [facility] ORS;  Service: General;  Laterality: N/A;   REVERSE SHOULDER ARTHROPLASTY Left 09/10/2020   Procedure: REVERSE SHOULDER ARTHROPLASTY;  Surgeon: Christena Flake, MD;  Location: [facility] ORS;  Service: Orthopedics;  Laterality: Left;     Social History:   reports that she quit smoking about 22 years ago. Her smoking use included cigarettes. She started smoking about 52 years ago. She has a 45 pack-year smoking history. She has never used smokeless tobacco. She reports current alcohol use. She reports that she does not use drugs.   Family History:  Her family history includes Emphysema in her father; Lung cancer in her brother; Melanoma in her sister; Ovarian cancer in her mother.   Allergies Allergies  Allergen Reactions   Atorvastatin Other (See Comments)    Myalgias made legs and back hurt    Atrovent Nasal Spray [Ipratropium]     Difficulty breathing   Codeine Nausea And Vomiting    Pt states that she can tolerate Tramadol fine.   Lipitor [Atorvastatin Calcium] Other (See Comments)    Myalgias     Home Medications  Prior to Admission medications   Medication Sig Start  Date End Date Taking? Authorizing Provider  acetaminophen (TYLENOL) 500 MG tablet Take 1,000 mg by mouth every 6 (six) hours as needed for moderate pain.   Yes [provider]  albuterol (VENTOLIN HFA) 108 (90 Base) MCG/ACT inhaler Inhale 2 puffs into the lungs every 4 (four) hours as needed for wheezing or shortness of breath. 04/19/23 07/18/23 Yes Yevonne Pax, MD  ALPRAZolam (  XANAX) 0.25 MG tablet Take 0.5 tablets (0.125 mg total) by mouth 2 (two) times daily as needed for anxiety. 08/13/22  Yes Yevonne Pax, MD  amitriptyline (ELAVIL) 25 MG tablet Take 25 mg by mouth at bedtime.   Yes [provider]  Budeson-Glycopyrrol-Formoterol (BREZTRI AEROSPHERE) 160-9-4.8 MCG/ACT AERO Inhale 2 puffs into the lungs 2 (two) times daily. 04/19/23  Yes Yevonne Pax, MD  busPIRone (BUSPAR) 10 MG tablet Take 1 tablet by mouth 2 (two) times daily. 06/23/23 06/22/24 Yes [provider]  carvedilol (COREG) 25 MG tablet Take 25 mg by mouth 2 (two) times daily.   Yes [provider]  Docusate Calcium (STOOL SOFTENER PO) Take 2 tablets by mouth daily.   Yes [provider]  escitalopram (LEXAPRO) 10 MG tablet Take 1 tablet (10 mg total) by mouth daily. Patient taking differently: Take 10 mg by mouth every morning. 11/24/18  Yes Crissman, Redge Gainer, MD  ezetimibe (ZETIA) 10 MG tablet Take 10 mg by mouth every morning. 11/02/19  Yes [provider]  furosemide (LASIX) 20 MG tablet Take 20 mg by mouth daily. 06/23/23  Yes [provider]  omeprazole (PRILOSEC) 20 MG capsule TAKE 1 CAPSULE BY MOUTH DAILY AS NEEDED FOR HEARTBURN 07/08/23  Yes Abernathy, Alyssa, NP  OXYGEN Inhale 2.5-3 L into the lungs at bedtime. 3 L portable   uses [employer] for oxygen   Yes [provider]  predniSONE (DELTASONE) 10 MG tablet Take 1 tablet (10 mg total) by mouth daily with breakfast. 04/19/23  Yes Yevonne Pax, MD  Roflumilast 250 MCG TABS Take 1 tablet by mouth  daily. 02/26/23  Yes Abernathy, Arlyss Repress, NP  theophylline (THEO-24) 100 MG 24 hr capsule Take 1 capsule (100 mg total) by mouth daily. Take 1 capsule by mouth daily. 02/26/23  Yes Yevonne Pax, MD  traMADol (ULTRAM) 50 MG tablet Take 1-2 tablets (50-100 mg total) by mouth every 6 (six) hours as needed for moderate pain. 09/11/20  Yes Anson Oregon, PA-C  zinc gluconate 50 MG tablet Take 50 mg by mouth daily.   Yes [provider]

## 2023-07-18 NOTE — Progress Notes (Signed)
 CBC reviewed, no concerns, will recheck in AM.   Diamantina Monks, MD General and Trauma Surgery [facility] Surgery

## 2023-07-18 NOTE — Progress Notes (Signed)
 Pt transported from CT to ED room 22 on BIPAP without any complications.

## 2023-07-18 NOTE — ED Notes (Signed)
 Provider at bedside

## 2023-07-18 NOTE — Progress Notes (Addendum)
 Patient seen and examined. She is nonverbal and does not follow commands, only moaning. Recommend continued supportive care. Noted code status of DNI. Recommend palliative c/s vs GOC discussion by primary team with family regarding operative plans as I think she has a profoundly high likelihood of not being a candidate for extubation post-operatively tomorrow. Hgb 11.6 on AM CBC, recommend recheck this PM and if stable, can dc serial labs.    Diamantina Monks, MD General and Trauma Surgery [facility] Surgery

## 2023-07-18 NOTE — Progress Notes (Signed)
 [facility] Progress Note Patient: Joyce Rodriguez GNF:[id number] DOB: 10/22/1945 DOA: 07/17/2023  DOS: the patient was seen and examined on 07/18/2023  Brief Hospital Course: PMH of severe COPD, chronic respiratory failure, CAD, GERD, HLD, HTN presented to the hospital with complaints of a fall.  After the fall the patient has remained confused and currently being treated for acute metabolic encephalopathy. Secondary to fall is found to have subcapital fracture neck of left femur, bilateral pubic ramus fracture as well as fracture of the right side of the pubic symphysis along with extraperitoneal hematoma around the pubic symphysis, 3.5 cm x 11.6 cm x 9.3 cm hematoma adjacent to the anterior aspect of the urinary bladder.  Trauma surgery, orthopedic surgery, pulmonary consulted. Assessment and Plan: Subcapital fracture of neck of left femur Bilateral pubic rami fractures, closed, initial encounter The patient presented with a mechanical fall. She appears to have sustained multiple fractures including bilateral pubic rami fracture as well as involvement of the pubic symphysis and subtotal fracture of the left femur. Patient originally presented to [facility] ED.  From there after consultation with the orthopedic surgery was transferred to [facility] Dr. Steward Drone and Dr. Roda Shutters are consulted. Surgery currently understand the patient will be moderate to high risk most likely for respiratory outcomes. Currently scheduled for surgery tomorrow.  Hematoma of extraperitoneal space Appreciate trauma surgery consultation. Monitoring H&H.  Reassuringly hemoglobin is stable. Patient received 1 PRBC transfusion already. Transfuse hemoglobin less than 8 or hemodynamic instability.  Acute metabolic encephalopathy Mentation is significantly altered from baseline. At baseline able to care for self ambulates with a walker. Currently unable to verbalize, unable to follow commands.  Appears to be severely delirious. CT of  the head is unremarkable for any acute abnormality. Does not appear that the suffering from any focal deficit. MRI brain ordered. EEG negative for any active seizures. ABG while shows hypercarbia does not show any evidence of decompensation. Will check metabolic workup. If mentation does not improve will consult neurology.  Preoperative medical evaluation Nolon Nations al. Estimated Risk Probability for Perioperative Myocardial Infarction or Cardiac Arrest: 1.2%  Pt can climb a flight of stair, ambulates around house using walker device, without any shortness of breath or chest pain. Stress test in 2021  negative for Has h/o severe COPD on 3 LPM Based on RCRI 30-day risk of death, MI, or cardiac arrest is 6 % Perioperative respiratory failure rate requiring prolonged ventilation 5 to 10%  Recommend Echocardiogram prior to surgery for preop cardiac evaluation.  Long discussion with husband and daughter at bedside, they understands patient will be at moderate to high risk for the surgical procedure. Alternative surgical intervention were discussed. They accepts all risks and wants to proceed with surgical intervention.   COPD (chronic obstructive pulmonary disease) Chronic respiratory failure with hypoxia and hypercapnia COPD exacerbation (HCC) Procalcitonin is mildly elevated. Patient is with severe respite distress Actually required BiPAP while she was at the outside facility. Currently back on home oxygen. Will treat as presumed COPD exacerbation with IV antibiotics, IV steroids and nebulization therapy and monitor. Appreciate pulmonary assistance.  Sinus tachycardia Likely secondary to pain. Check echocardiogram for Blood pressure is stable.  Hemoglobin is stable.  Will check TSH and free T4.  Fall Appears to be mechanical based on the history provided by husband.  She was trying to walk into her bathroom with a walker and then switched to cane and had a fall. Monitor  Urinary  retention Likely secondary combination of delirium as well  as hematoma.  Foley catheter inserted. Monitor.  Major depression, chronic On BuSpar, Xanax, Lexapro Monitor for now, judicious use of IV Ativan as needed.  Essential hypertension Blood pressure is mildly elevated. On Lopressor as needed.  Hyperlipidemia Continue statin once able to take p.o.  CAD (coronary artery disease), native coronary artery Stress test in 2021 was negative for any acute ischemia. CT chest shows moderate calcification in coronary arteries.  Normocytic anemia H&H relatively stable on the less than 12. Monitor.  Goals of care conversation. DNR present on admission Discussed with husband.  Patient remains DNR/DNI.Marland Kitchen Prognosis is very poor regardless of the decision given high risk for prolong ventilator needs after surgery as well as severe delirium before the surgery. Will monitor.  Leukocytosis Likely stress reaction. For now we will monitor with On antibiotics.  Lactic acidosis Currently receiving IV fluid. Monitor with      Subjective: I was called to evaluate the patient at bedside as she has been minimally responsive on BiPAP.  After removal of the BiPAP she remains minimally responsive and continues to moan.  Nonverbal.  No nausea no vomiting.  Last known normal was before the fall.  History was obtained from husband as well as daughter who reported that the fall was mechanical in nature.  Physical Exam: General: in severe distress, No Rash Cardiovascular: S1 and S2 Present, No Murmur Respiratory: Increased respiratory effort, Bilateral Air entry present.  Faint basal crackles, No wheezes Abdomen: Bowel Sound present, difficult to assess  tenderness Extremities: No edema Neuro: Drowsy, easily arousable though on verbal cues, unable to verbalize, unable to follow any commands, withdraws to painful stimuli bilaterally equally.  Data Reviewed: I have Reviewed nursing notes, Vitals, and  Lab results. Since last encounter, pertinent lab results CBC and BMP   . I have ordered test including CBC, BMP, TSH, free T4, B12, folic acid  . I have discussed pt's care plan and test results with trauma surgery, orthopedic surgery, pulmonary  . I have ordered imaging MRI brain, EEG, echocardiogram  .   Disposition: Status is: Inpatient Remains inpatient appropriate because: Monitor for postop recovery.  Place and maintain sequential compression device Start: 07/18/23 1617 SCDs Start: 07/18/23 0513   Family Communication: Discussed with husband and daughter at bedside. Level of care: Progressive   Vitals:   07/18/23 1436 07/18/23 1530 07/18/23 1600 07/18/23 1639  BP: 104/67 122/69 124/71   Pulse: (!) 111 (!) 122 (!) 126   Resp: 16 19 18    Temp: 99.1 F (37.3 C)  99.9 F (37.7 C) 100.2 F (37.9 C)  TempSrc: Axillary  Axillary Rectal  SpO2: 90% 91% 93%   Weight:      Height:        The patient is critically ill with multiple organ systems failure and requires high complexity decision making for assessment and support, frequent evaluation and titration of therapies. Critical Care Time devoted to patient care services described in this note is 35 minutes  Author: Lynden Oxford, MD 07/18/2023 5:19 PM  Please look on [email or web address] to find out who is on call.

## 2023-07-18 NOTE — Progress Notes (Addendum)
 Patient is responding to voice and moaning with pain, pain medications given as needed,. She is not following commands but can open eyes with voice, on [place] at liters spo2 maintained >90%, BP WNL, HR was elevated up to 140s informed to the MD and 2.5 mg lopressor given.  Tempeture was slightly elevated to 100.2, PRN rectal Tynolol given.   Was not able to void so foley inserted, urine output is only 50ml since 1130, Informed to the MD, IVF increased.  1855 Pt's urine out is unchanged even with contd IVF, informed to the MD, HR was in 130s PRN metoprolol given.  Will continue to monitor.

## 2023-07-18 NOTE — Progress Notes (Signed)
 EEG complete - results pending

## 2023-07-18 NOTE — ED Notes (Addendum)
 Admitting Provider at bedside.

## 2023-07-18 NOTE — H&P (Addendum)
 History and Physical  Joyce Rodriguez:[id number] DOB: [birth year] DOA: 07/17/2023  Referring physician: Dr. Madilyn Hook, EDP  PCP: Marguarite Arbour, MD  Outpatient Specialists: Pulmonary, cardiology. Patient coming from: Home.  Chief Complaint: Fall.  HPI: Joyce Rodriguez is a 78 y.o. female with medical history significant for severe emphysema on 3 L nasal cannula at baseline, hypertension, hyperlipidemia, prediabetes, chronic anxiety/depression, who initially presented to [facility] ED after a fall at home.  The patient lost her balance.  At baseline she uses a walker.  At that time she was using a cane and loss of balance while walking to her bathroom and fell.  EMS was activated.  The patient was noted to be significantly hypoxic and in acute respiratory distress.  In the ER, the patient was placed on BiPAP.  Imaging revealed pelvic and hip fractures with extraperitoneal hematoma with fairly extensive with small foci of extravasation though no targets for embolization per Dr. Pecola Leisure discussion with IR.  EDP discussed the case with orthopedic surgery recommended transfer to [facility] for further management of all her fractures.  Seen by trauma team Dr. Fredricka Bonine.  TRH, hospitalist service, was asked to admit.  Anticoagulated, the patient is in the room on BiPAP and tolerating well.  Husband and daughter are at bedside.  ED Course: Temperature 98.  BP 162/99, pulse 128, respiratory 22, O2 saturation 99% on BiPAP.  Lab studies notable for serum glucose of 217, albumin 2.2.  Lactic acid 2.6, 2.1.  WBC 29.3.  Review of Systems: Review of systems as noted in the HPI. All other systems reviewed and are negative.   Past Medical History:  Diagnosis Date   Aortic atherosclerosis (HCC)    Arthritis    Complication of anesthesia    difficulty waking up   COPD (chronic obstructive pulmonary disease) (HCC)    Coronary artery disease    Dyspnea    Emphysema of lung (HCC)    SEVERE   GERD  (gastroesophageal reflux disease)    Headache    H/O   History of hiatal hernia    Hyperlipidemia    Hypertension    H/O OFF MEDS SINCE 2020 BY PCP   Osteoporosis    Oxygen deficiency    uses O2 at night   Oxygen dependent    Pneumonia    Umbilical hernia    Past Surgical History:  Procedure Laterality Date   ABDOMINAL HYSTERECTOMY     CHOLECYSTECTOMY     ESOPHAGEAL MANOMETRY N/A 09/13/2017   Procedure: ESOPHAGEAL MANOMETRY (EM);  Surgeon: Napoleon Form, MD;  Location: [facility] ENDOSCOPY;  Service: Endoscopy;  Laterality: N/A;   ESOPHAGOGASTRODUODENOSCOPY (EGD) WITH PROPOFOL N/A 05/11/2017   Procedure: ESOPHAGOGASTRODUODENOSCOPY (EGD) WITH PROPOFOL;  Surgeon: Toney Reil, MD;  Location: [facility] ENDOSCOPY;  Service: Gastroenterology;  Laterality: N/A;   EYE SURGERY Bilateral    CATARACTS   FOOT SURGERY     MORTONS NEUROMA   LAPAROSCOPIC NISSEN FUNDOPLICATION N/A 10/19/2017   Procedure: LAPAROSCOPIC NISSEN FUNDOPLICATION;  Surgeon: Leafy Ro, MD;  Location: [facility] ORS;  Service: General;  Laterality: N/A;   NISSEN FUNDOPLICATION N/A 10/19/2017   Procedure: NISSEN FUNDOPLICATION;  Surgeon: Leafy Ro, MD;  Location: [facility] ORS;  Service: General;  Laterality: N/A;   REVERSE SHOULDER ARTHROPLASTY Left 09/10/2020   Procedure: REVERSE SHOULDER ARTHROPLASTY;  Surgeon: Christena Flake, MD;  Location: [facility] ORS;  Service: Orthopedics;  Laterality: Left;    Social History:  reports that she quit smoking about 22 years ago.  Her smoking use included cigarettes. She started smoking about 52 years ago. She has a 45 pack-year smoking history. She has never used smokeless tobacco. She reports current alcohol use. She reports that she does not use drugs.   Allergies  Allergen Reactions   Atorvastatin Other (See Comments)    Myalgias made legs and back hurt    Atrovent Nasal Spray [Ipratropium]     Difficulty breathing   Codeine Nausea And Vomiting    Pt states that she can tolerate  Tramadol fine.   Lipitor [Atorvastatin Calcium] Other (See Comments)    Myalgias    Family History  Problem Relation Age of Onset   Ovarian cancer Mother    Emphysema Father    Melanoma Sister    Lung cancer Brother       Prior to Admission medications   Medication Sig Start Date End Date Taking? Authorizing Provider  acetaminophen (TYLENOL) 500 MG tablet Take 1,000 mg by mouth every 6 (six) hours as needed for moderate pain.   Yes [provider]  albuterol (VENTOLIN HFA) 108 (90 Base) MCG/ACT inhaler Inhale 2 puffs into the lungs every 4 (four) hours as needed for wheezing or shortness of breath. 04/19/23 07/18/23 Yes Yevonne Pax, MD  ALPRAZolam Prudy Feeler) 0.25 MG tablet Take 0.5 tablets (0.125 mg total) by mouth 2 (two) times daily as needed for anxiety. 08/13/22  Yes Yevonne Pax, MD  amitriptyline (ELAVIL) 25 MG tablet Take 25 mg by mouth at bedtime.   Yes [provider]  Budeson-Glycopyrrol-Formoterol (BREZTRI AEROSPHERE) 160-9-4.8 MCG/ACT AERO Inhale 2 puffs into the lungs 2 (two) times daily. 04/19/23  Yes Yevonne Pax, MD  busPIRone (BUSPAR) 10 MG tablet Take 1 tablet by mouth 2 (two) times daily. 06/23/23 06/22/24 Yes [provider]  carvedilol (COREG) 25 MG tablet Take 25 mg by mouth 2 (two) times daily.   Yes [provider]  Docusate Calcium (STOOL SOFTENER PO) Take 2 tablets by mouth daily.   Yes [provider]  escitalopram (LEXAPRO) 10 MG tablet Take 1 tablet (10 mg total) by mouth daily. Patient taking differently: Take 10 mg by mouth every morning. 11/24/18  Yes Crissman, Redge Gainer, MD  ezetimibe (ZETIA) 10 MG tablet Take 10 mg by mouth every morning. 11/02/19  Yes [provider]  furosemide (LASIX) 20 MG tablet Take 20 mg by mouth daily. 06/23/23  Yes [provider]  omeprazole (PRILOSEC) 20 MG capsule TAKE 1 CAPSULE BY MOUTH DAILY AS NEEDED FOR HEARTBURN 07/08/23  Yes Abernathy, Alyssa, NP  OXYGEN Inhale 2.5-3 L  into the lungs at bedtime. 3 L portable   uses [employer] for oxygen   Yes [provider]  predniSONE (DELTASONE) 10 MG tablet Take 1 tablet (10 mg total) by mouth daily with breakfast. 04/19/23  Yes Yevonne Pax, MD  Roflumilast 250 MCG TABS Take 1 tablet by mouth daily. 02/26/23  Yes Abernathy, Arlyss Repress, NP  theophylline (THEO-24) 100 MG 24 hr capsule Take 1 capsule (100 mg total) by mouth daily. Take 1 capsule by mouth daily. 02/26/23  Yes Yevonne Pax, MD  traMADol (ULTRAM) 50 MG tablet Take 1-2 tablets (50-100 mg total) by mouth every 6 (six) hours as needed for moderate pain. 09/11/20  Yes Anson Oregon, PA-C  zinc gluconate 50 MG tablet Take 50 mg by mouth daily.   Yes [provider]    Physical Exam: BP (!) 134/95   Pulse (!) 127  Temp 98 F (36.7 C) (Axillary)   Resp (!) 22   Ht 5\' 6"  (1.676 m)   Wt 68 kg   SpO2 99%   BMI 24.20 kg/m   General: 78 y.o. year-old female well developed well nourished in no acute distress.  Somnolent, easily arousable.  On BiPAP Cardiovascular: Tachycardic with no rubs or gallops.  No thyromegaly or JVD noted.  Trace lower extremity edema bilaterally. Respiratory: Mild diffuse rales bilaterally.  Poor inspiratory effort. Abdomen: Soft nontender nondistended with normal bowel sounds x4 quadrants. Muskuloskeletal: No cyanosis or clubbing bilaterally.  Left hip is externally rotated. Neuro: CN II-XII intact, strength, sensation, reflexes Skin: No ulcerative lesions noted or rashes.  Bruising involving left upper extremity. Psychiatry: Unable to assess judgment or mood due to somnolence.         Labs on Admission:  Basic Metabolic Panel: Recent Labs  Lab 07/17/23 1707 07/18/23 0028 07/18/23 0035 07/18/23 0349  NA 139 136 138 138  K 4.1 4.3 4.4 4.7  CL 97* 98  --   --   CO2 34* 31  --   --   GLUCOSE 246* 217*  --   --   BUN 17 17  --   --   CREATININE 0.60 0.80  --   --   CALCIUM 8.9 8.0*  --   --     Liver Function Tests: Recent Labs  Lab 07/17/23 1707 07/18/23 0028  AST 23 19  ALT 27 23  ALKPHOS 54 35*  BILITOT 0.5 0.5  PROT 6.4* 4.5*  ALBUMIN 3.5 2.2*   No results for input(s): "LIPASE", "AMYLASE" in the last 168 hours. No results for input(s): "AMMONIA" in the last 168 hours. CBC: Recent Labs  Lab 07/17/23 1707 07/18/23 0028 07/18/23 0035 07/18/23 0342 07/18/23 0349  WBC 21.9* 28.5*  --  29.3*  --   NEUTROABS 18.2* 27.1*  --   --   --   HGB 12.0 9.0* 9.5* 11.6* 12.2  HCT 39.2 29.2* 28.0* 37.2 36.0  MCV 101.3* 100.7*  --  96.9  --   PLT 307 200  --  206  --    Cardiac Enzymes: No results for input(s): "CKTOTAL", "CKMB", "CKMBINDEX", "TROPONINI" in the last 168 hours.  BNP (last 3 results) Recent Labs    02/25/23 1200 07/17/23 1709  BNP 86.8 113.9*    ProBNP (last 3 results) No results for input(s): "PROBNP" in the last 8760 hours.  CBG: No results for input(s): "GLUCAP" in the last 168 hours.  Radiological Exams on Admission: CT Angio Chest PE W and/or Wo Contrast Result Date: 07/18/2023 CLINICAL DATA:  Pulmonary embolism (PE), high prob; Abdominal trauma, blunt EXAM: CT ANGIOGRAPHY CHEST CT ABDOMEN AND PELVIS WITH CONTRAST TECHNIQUE: Multidetector CT imaging of the chest was performed using the standard protocol during bolus administration of intravenous contrast. Multiplanar CT image reconstructions and MIPs were obtained to evaluate the vascular anatomy. Multidetector CT imaging of the abdomen and pelvis was performed using the standard protocol during bolus administration of intravenous contrast. RADIATION DOSE REDUCTION: This exam was performed according to the departmental dose-optimization program which includes automated exposure control, adjustment of the mA and/or kV according to patient size and/or use of iterative reconstruction technique. CONTRAST:  75mL OMNIPAQUE IOHEXOL 350 MG/ML SOLN COMPARISON:  CT chest 12/11/2020, CT abdomen pelvis  05/19/2019 FINDINGS: CTA CHEST FINDINGS Cardiovascular: Adequate opacification of the pulmonary arterial tree. No intraluminal filling defect identified to suggest acute pulmonary embolism. Central pulmonary arteries are  of normal caliber. Moderate coronary artery calcification. Cardiac size within normal limits though enlargement of the right ventricle is noted suggesting elevated right heart pressures and there is reflux of contrast into the hepatic venous system in keeping with a least some degree of right heart failure. No pericardial effusion. Moderate atherosclerotic calcification within the thoracic aorta. No aortic aneurysm. Mediastinum/Nodes: No enlarged mediastinal, hilar, or axillary lymph nodes. Thyroid gland, trachea, and esophagus demonstrate no significant findings. Lungs/Pleura: Severe emphysema. Interval development of widespread superimposed ground-glass pulmonary infiltrate, infection versus edema. Stable scarring within the right middle lobe. Trace right and small left pleural effusions are present. No pneumothorax. Musculoskeletal: No chest wall abnormality. No acute or significant osseous findings. Review of the MIP images confirms the above findings. CT ABDOMEN and PELVIS FINDINGS Hepatobiliary: No focal liver abnormality is seen. Status post cholecystectomy. Mild intrahepatic biliary ductal dilation in keeping with post cholecystectomy change. No extra hepatic biliary dilatation. Pancreas: Unremarkable Spleen: Unremarkable Adrenals/Urinary Tract: The adrenal glands are unremarkable. The kidneys are normal in size and position. Simple cortical cyst is seen within the upper pole the right kidney for which no follow-up imaging is recommended. The bladder is mildly distended. There is extensive high attenuation fluid within the space of Retzius compatible with acute extraperitoneal hemorrhage related to right pubic symphyseal fracture with several small foci of active extravasation identified  immediately superior to the fracture best seen on coronal image # 55/8. Stomach/Bowel: Moderate sigmoid diverticulosis. Stomach, small bowel, and large bowel are otherwise unremarkable. Appendix absent. No evidence of obstruction or focal inflammation. No free intraperitoneal gas or fluid. Vascular/Lymphatic: Aortic atherosclerosis. No enlarged abdominal or pelvic lymph nodes. Reproductive: Status post hysterectomy. No adnexal masses. Other: Tiny fat containing umbilical hernia. No free intraperitoneal fluid. Musculoskeletal: Acute fracture of the right superior pubic ramus extending into the pubic symphysis. Acute subcapital left femoral neck fracture with moderate external rotation, override, and marked varus angulation of the distal fracture fragment. Femoral head is still seated within the left acetabulum. Mild bilateral degenerative hip arthritis. There is mixed lytic and sclerotic process within the left sacral ala, new since prior examination suspicious for a insufficiency fracture with mild cortical irregularity noted. Review of the MIP images confirms the above findings. IMPRESSION: 1. No pulmonary embolism. 2. Moderate coronary artery calcification. Enlargement of the right ventricle suggesting elevated right heart pressures and there is reflux of contrast into the hepatic venous system in keeping with a least some degree of right heart failure. 3. Severe emphysema. Interval development of widespread superimposed ground-glass pulmonary infiltrate, infection versus edema. Trace right and small left pleural effusions. 4. Acute fracture of the right superior pubic ramus extending into the pubic symphysis with extensive associated extraperitoneal hemorrhage and several small foci of active extravasation immediately superior to the fracture. 5. Acute subcapital left femoral neck fracture with moderate external rotation, override, and marked varus angulation of the distal fracture fragment. 6. Mixed lytic and  sclerotic process within the left sacral ala, new since prior examination suspicious for a insufficiency fracture with mild cortical irregularity noted. This could be confirmed with MRI examination 7. Moderate sigmoid diverticulosis. Aortic Atherosclerosis (ICD10-I70.0) and Emphysema (ICD10-J43.9). Electronically Signed   By: Helyn Numbers M.D.   On: 07/18/2023 00:59   CT ABDOMEN PELVIS W CONTRAST Result Date: 07/18/2023 CLINICAL DATA:  Pulmonary embolism (PE), high prob; Abdominal trauma, blunt EXAM: CT ANGIOGRAPHY CHEST CT ABDOMEN AND PELVIS WITH CONTRAST TECHNIQUE: Multidetector CT imaging of the chest was performed using the standard  protocol during bolus administration of intravenous contrast. Multiplanar CT image reconstructions and MIPs were obtained to evaluate the vascular anatomy. Multidetector CT imaging of the abdomen and pelvis was performed using the standard protocol during bolus administration of intravenous contrast. RADIATION DOSE REDUCTION: This exam was performed according to the departmental dose-optimization program which includes automated exposure control, adjustment of the mA and/or kV according to patient size and/or use of iterative reconstruction technique. CONTRAST:  75mL OMNIPAQUE IOHEXOL 350 MG/ML SOLN COMPARISON:  CT chest 12/11/2020, CT abdomen pelvis 05/19/2019 FINDINGS: CTA CHEST FINDINGS Cardiovascular: Adequate opacification of the pulmonary arterial tree. No intraluminal filling defect identified to suggest acute pulmonary embolism. Central pulmonary arteries are of normal caliber. Moderate coronary artery calcification. Cardiac size within normal limits though enlargement of the right ventricle is noted suggesting elevated right heart pressures and there is reflux of contrast into the hepatic venous system in keeping with a least some degree of right heart failure. No pericardial effusion. Moderate atherosclerotic calcification within the thoracic aorta. No aortic aneurysm.  Mediastinum/Nodes: No enlarged mediastinal, hilar, or axillary lymph nodes. Thyroid gland, trachea, and esophagus demonstrate no significant findings. Lungs/Pleura: Severe emphysema. Interval development of widespread superimposed ground-glass pulmonary infiltrate, infection versus edema. Stable scarring within the right middle lobe. Trace right and small left pleural effusions are present. No pneumothorax. Musculoskeletal: No chest wall abnormality. No acute or significant osseous findings. Review of the MIP images confirms the above findings. CT ABDOMEN and PELVIS FINDINGS Hepatobiliary: No focal liver abnormality is seen. Status post cholecystectomy. Mild intrahepatic biliary ductal dilation in keeping with post cholecystectomy change. No extra hepatic biliary dilatation. Pancreas: Unremarkable Spleen: Unremarkable Adrenals/Urinary Tract: The adrenal glands are unremarkable. The kidneys are normal in size and position. Simple cortical cyst is seen within the upper pole the right kidney for which no follow-up imaging is recommended. The bladder is mildly distended. There is extensive high attenuation fluid within the space of Retzius compatible with acute extraperitoneal hemorrhage related to right pubic symphyseal fracture with several small foci of active extravasation identified immediately superior to the fracture best seen on coronal image # 55/8. Stomach/Bowel: Moderate sigmoid diverticulosis. Stomach, small bowel, and large bowel are otherwise unremarkable. Appendix absent. No evidence of obstruction or focal inflammation. No free intraperitoneal gas or fluid. Vascular/Lymphatic: Aortic atherosclerosis. No enlarged abdominal or pelvic lymph nodes. Reproductive: Status post hysterectomy. No adnexal masses. Other: Tiny fat containing umbilical hernia. No free intraperitoneal fluid. Musculoskeletal: Acute fracture of the right superior pubic ramus extending into the pubic symphysis. Acute subcapital left  femoral neck fracture with moderate external rotation, override, and marked varus angulation of the distal fracture fragment. Femoral head is still seated within the left acetabulum. Mild bilateral degenerative hip arthritis. There is mixed lytic and sclerotic process within the left sacral ala, new since prior examination suspicious for a insufficiency fracture with mild cortical irregularity noted. Review of the MIP images confirms the above findings. IMPRESSION: 1. No pulmonary embolism. 2. Moderate coronary artery calcification. Enlargement of the right ventricle suggesting elevated right heart pressures and there is reflux of contrast into the hepatic venous system in keeping with a least some degree of right heart failure. 3. Severe emphysema. Interval development of widespread superimposed ground-glass pulmonary infiltrate, infection versus edema. Trace right and small left pleural effusions. 4. Acute fracture of the right superior pubic ramus extending into the pubic symphysis with extensive associated extraperitoneal hemorrhage and several small foci of active extravasation immediately superior to the fracture. 5. Acute subcapital  left femoral neck fracture with moderate external rotation, override, and marked varus angulation of the distal fracture fragment. 6. Mixed lytic and sclerotic process within the left sacral ala, new since prior examination suspicious for a insufficiency fracture with mild cortical irregularity noted. This could be confirmed with MRI examination 7. Moderate sigmoid diverticulosis. Aortic Atherosclerosis (ICD10-I70.0) and Emphysema (ICD10-J43.9). Electronically Signed   By: Helyn Numbers M.D.   On: 07/18/2023 00:59   CT PELVIS WO CONTRAST Result Date: 07/17/2023 CLINICAL DATA:  Ground level fall. EXAM: CT PELVIS WITHOUT CONTRAST TECHNIQUE: Multidetector CT imaging of the pelvis was performed following the standard protocol without intravenous contrast. RADIATION DOSE REDUCTION:  This exam was performed according to the departmental dose-optimization program which includes automated exposure control, adjustment of the mA and/or kV according to patient size and/or use of iterative reconstruction technique. COMPARISON:  May 19, 2019 FINDINGS: Urinary Tract: A 3.5 cm x 11.6 cm x 9.3 cm hematoma is seen adjacent to the anterior aspect of an intact, moderate to markedly distended urinary bladder. This extends inferiorly along the region adjacent to the urinary bladder on the right, to the posterior aspect of acute comminuted fracture deformity involving the symphysis pubis on the right. Bowel: There is no evidence of bowel dilatation. Noninflamed diverticula are seen throughout the sigmoid colon. Vascular/Lymphatic: There is marked severity calcification of the visualized portion of the infrarenal abdominal aorta and bilateral common iliac arteries. No abnormal abdominal or pelvic lymph nodes are identified. Reproductive: The uterus is surgically absent. The bilateral adnexa are unremarkable. Other: A 1.5 cm diameter fat containing umbilical hernia is seen. No abdominopelvic ascites. Musculoskeletal: There is an acute, comminuted, mildly displaced fracture deformity involving the medial aspect of the right superior pubic ramus. This extends to the level of the symphysis pubis. Additional acute, nondisplaced fractures are seen involving the left superior pubic ramus, bilateral inferior pubic rami, and the head and neck of the proximal left femur. There is no evidence of dislocation. IMPRESSION: 1. Acute, comminuted, mildly displaced fracture deformity involving the medial aspect of the right superior pubic ramus and symphysis pubis on the right. 2. Additional acute, nondisplaced fractures involving the left superior pubic ramus, bilateral inferior pubic rami, and the head and neck of the proximal left femur. 3. 3.5 cm x 11.6 cm x 9.3 cm hematoma adjacent to the anterior aspect of the urinary  bladder, as described above. 4. Sigmoid diverticulosis. 5. Aortic atherosclerosis. Aortic Atherosclerosis (ICD10-I70.0). Electronically Signed   By: Aram Candela M.D.   On: 07/17/2023 19:49   DG Hip Unilat W or Wo Pelvis 2-3 Views Left Result Date: 07/17/2023 CLINICAL DATA:  Ground level fall hip injury EXAM: DG HIP (WITH OR WITHOUT PELVIS) 2-3V LEFT COMPARISON:  None Available. FINDINGS: SI joints are non widened. Pubic symphysis appears intact. Acute displaced left femoral neck fracture. No femoral head dislocation. Acute appearing mildly displaced right superior pubic ramus fracture. Linear lucency at the left inferior pubic ramus, cannot exclude nondisplaced fracture. IMPRESSION: 1. Acute displaced left femoral neck fracture. 2. Acute mildly displaced right superior pubic ramus fracture. 3. Linear lucency at the left inferior pubic ramus, cannot exclude nondisplaced fracture. Electronically Signed   By: Jasmine Pang M.D.   On: 07/17/2023 18:21   CT Cervical Spine Wo Contrast Result Date: 07/17/2023 CLINICAL DATA:  Ground level fall EXAM: CT CERVICAL SPINE WITHOUT CONTRAST TECHNIQUE: Multidetector CT imaging of the cervical spine was performed without intravenous contrast. Multiplanar CT image reconstructions were also generated. RADIATION DOSE  REDUCTION: This exam was performed according to the departmental dose-optimization program which includes automated exposure control, adjustment of the mA and/or kV according to patient size and/or use of iterative reconstruction technique. COMPARISON:  Radiograph report 12/31/2017 FINDINGS: Alignment: Significant motion degradation. Straightening of the cervical spine. Facet alignment grossly maintained Skull base and vertebrae: Limited by motion. Normal vertebral body heights. No gross fracture Soft tissues and spinal canal: No prevertebral fluid or swelling. No visible canal hematoma. Disc levels: Moderate severe diffuse disc space narrowing C3 through T1.  Partial ankylosis C4-C5. Hypertrophic facet degenerative changes at multiple levels with foraminal narrowing Upper chest: Apical emphysema Other: None IMPRESSION: 1. Significant motion degradation. No gross acute osseous abnormality. 2. Multilevel degenerative changes. 3. Emphysema Electronically Signed   By: Jasmine Pang M.D.   On: 07/17/2023 18:19   CT Head Wo Contrast Result Date: 07/17/2023 CLINICAL DATA:  Ground level fall EXAM: CT HEAD WITHOUT CONTRAST TECHNIQUE: Contiguous axial images were obtained from the base of the skull through the vertex without intravenous contrast. RADIATION DOSE REDUCTION: This exam was performed according to the departmental dose-optimization program which includes automated exposure control, adjustment of the mA and/or kV according to patient size and/or use of iterative reconstruction technique. COMPARISON:  None Available. FINDINGS: Brain: No acute territorial infarction, hemorrhage or intracranial mass. Small chronic appearing left cerebellar infarcts. Atrophy and patchy white matter hypodensity likely chronic small vessel ischemic change. Nonenlarged ventricles Vascular: No hyperdense vessel or unexpected calcification. Skull: Normal. Negative for fracture or focal lesion. Sinuses/Orbits: No acute finding. Other: None IMPRESSION: 1. No CT evidence for acute intracranial abnormality. 2. Atrophy and chronic small vessel ischemic changes of the white matter. Small chronic appearing left cerebellar infarcts. Electronically Signed   By: Jasmine Pang M.D.   On: 07/17/2023 18:10   DG Chest Port 1 View Result Date: 07/17/2023 CLINICAL DATA:  Questionable sepsis - evaluate for abnormality Fall. EXAM: PORTABLE CHEST 1 VIEW COMPARISON:  Radiograph 04/21/2018 FINDINGS: The lungs are hyperinflated with at least moderate emphysema. Peribronchial thickening most prominent in the mid lower lung zones. Subsegmental atelectasis at the left lung base. Stable heart size and mediastinal  contours. Aortic atherosclerosis. Calcified granuloma in the right lung. No pulmonary edema, pneumothorax, or large pleural effusion. Left shoulder arthroplasty. IMPRESSION: 1. Hyperinflation with at least moderate emphysema. Peribronchial thickening most prominent in the mid lower lung zones, may represent COPD exacerbation. 2. Subsegmental atelectasis at the left lung base. Electronically Signed   By: Narda Rutherford M.D.   On: 07/17/2023 17:51    EKG: I independently viewed the EKG done and my findings are as followed: Sinus tachycardia rate of 134.  Nonspecific ST-T changes.  TC 468.  Assessment/Plan Present on Admission:  Fall  Principal Problem:   Fall  Acute pelvic and hip fractures, seen on x-ray and CT scan. -Acute fracture of the right superior pubic ramus extending into the pubic symphysis with extensive associated extraperitoneal hemorrhage and several small foci of active extravasation immediately superior to the fracture -Acute subcapital left femoral neck fracture with moderate external rotation, and mild varus angulation of the distal fracture fragment Mixed lytic and sclerotic process within the left sacral alla Seen by trauma team, appreciate assistance. Orthopedic surgery consulted. As needed analgesics  Mechanical fall History of ambulatory dysfunction, uses a walker at baseline Was using a cane at the time of her fall 3 falls in the past 6 months.  Last fall prior to this fall was about 2 weeks ago. Continue  fall precautions  Severe emphysema on 3 L nasal cannula continuously Acute on chronic hypoxic and hypercarbic respiratory failure On presentation VBG with pH 7.26, pCO2 89. Currently on BiPAP Wean off BiPAP as tolerated Maintain O2 saturation above 90%. Resume home bronchodilators Strict n.p.o. while on BiPAP.  Community-acquired pneumonia seen on CT scan Continue Rocephin daily and IV doxycycline twice daily Obtain baseline  procalcitonin  Hyperglycemia Presented with serum glucose of 217 Obtain hemoglobin A1c Start insulin sliding scale every 4 hours while NPO Gentle IV fluid hydration LR 50 cc/h x 12 hours.  Chronic anxiety/depression Resume home regimen when no longer n.p.o.  Hyperlipidemia Resume home regimen when no longer NPO.    Critical care time: 65 minutes.    DVT prophylaxis: SCDs.  Code Status: DNR/DNI as confirmed by the patient's husband at bedside.  Family Communication: Updated patient's husband and daughter at bedside.  Disposition Plan: Admitted to progressive care unit.  Consults called: Trauma team, orthopedic surgery.  Admission status: Inpatient status.   Status is: Inpatient The patient requires at least 2 midnights for further evaluation and treatment of present condition.   Darlin Drop MD [facility] Pager [phone]  If 7PM-7AM, please contact night-coverage [email or web address] Password [facility]  07/18/2023, 5:17 AM

## 2023-07-18 NOTE — Progress Notes (Signed)
 Pt transported from ED room 22 to 4E21 on BIPAP without any complications.

## 2023-07-18 NOTE — Progress Notes (Signed)
 I am aware of patient and have discussed with Dr. Steward Drone.  I have reviewed pertinent imaging.  Will plan to perform left hip hemiarthroplasty Monday pending medical optimization.  Pelvic fractures will be treated nonop.

## 2023-07-18 NOTE — ED Notes (Signed)
 ED Provider at bedside.

## 2023-07-18 NOTE — Consult Note (Signed)
 Surgical Evaluation Requesting provider: Dr. Madilyn Hook  Chief Complaint: Fall  HPI: History taken from chart review and discussion with other providers as patient is unable to provide.  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  She presented in respiratory distress (on presentation noted to have an O2 sat of 67% with PaCO2 of 89) and was placed on BiPAP and subsequently transferred to [facility] for hospitalist admission and orthopedics consultation regarding her hip fracture which will require operative intervention.  Additionally has been started on antibiotics for presumed sepsis.  On presentation was conversant and able to relay the full history of events. Since arrival here her workup has been completed with a CT angio chest and contrasted CT of the abdomen and pelvis.  This is negative for pulmonary embolism, but of note demonstrates enlargement of the right ventricle and reflux of contrast into the hepatic venous system suggesting some degree of right heart failure, severe emphysema and widespread superimposed groundglass pulmonary infiltrate concerning for infection versus edema, trace bilateral effusions, redemonstrated superior pubic ramus fracture with extensive extraperitoneal hemorrhage with several small foci of active extravasation and acute left femoral neck fracture.  Her hemoglobin on recheck here has decreased to 9.5, from 12 on initial presentation 7 hours prior, though of note she has also received crystalloid boluses for hypotension and tachycardia.  She is currently receiving blood transfusion. EDP has discussed her case with IR who does not feel there is a target for embolization.  She is  DNR/DNI; her daughter at the bedside confirms  Allergies  Allergen Reactions   Atorvastatin Other (See Comments)    Myalgias made legs and back hurt    Atrovent Nasal Spray [Ipratropium]     Difficulty breathing   Codeine Nausea And Vomiting    Pt states that she can tolerate Tramadol fine.   Lipitor [Atorvastatin Calcium] Other (See Comments)    Myalgias    Past Medical History:  Diagnosis Date   Aortic atherosclerosis (HCC)    Arthritis    Complication of anesthesia    difficulty waking up   COPD (chronic obstructive pulmonary disease) (HCC)    Coronary artery disease    Dyspnea    Emphysema of lung (HCC)    SEVERE   GERD (gastroesophageal reflux disease)    Headache    H/O   History of hiatal hernia    Hyperlipidemia    Hypertension    H/O OFF MEDS SINCE 2020 BY PCP   Osteoporosis    Oxygen deficiency    uses O2 at night   Oxygen dependent    Pneumonia    Umbilical hernia     Past Surgical History:  Procedure Laterality Date   ABDOMINAL HYSTERECTOMY     CHOLECYSTECTOMY     ESOPHAGEAL MANOMETRY N/A 09/13/2017   Procedure: ESOPHAGEAL MANOMETRY (EM);  Surgeon: Napoleon Form, MD;  Location: [facility] ENDOSCOPY;  Service: Endoscopy;  Laterality: N/A;   ESOPHAGOGASTRODUODENOSCOPY (EGD) WITH PROPOFOL N/A 05/11/2017   Procedure: ESOPHAGOGASTRODUODENOSCOPY (EGD) WITH PROPOFOL;  Surgeon: Toney Reil, MD;  Location: [facility] ENDOSCOPY;  Service: Gastroenterology;  Laterality: N/A;   EYE SURGERY Bilateral    CATARACTS   FOOT SURGERY     MORTONS NEUROMA   LAPAROSCOPIC NISSEN FUNDOPLICATION  N/A 10/19/2017   Procedure: LAPAROSCOPIC NISSEN FUNDOPLICATION;  Surgeon: Leafy Ro, MD;  Location: [facility] ORS;  Service: General;  Laterality: N/A;   NISSEN FUNDOPLICATION N/A 10/19/2017   Procedure: NISSEN FUNDOPLICATION;  Surgeon: Leafy Ro, MD;  Location: [facility] ORS;  Service: General;  Laterality: N/A;   REVERSE SHOULDER ARTHROPLASTY Left 09/10/2020   Procedure: REVERSE  SHOULDER ARTHROPLASTY;  Surgeon: Christena Flake, MD;  Location: [facility] ORS;  Service: Orthopedics;  Laterality: Left;    Family History  Problem Relation Age of Onset   Ovarian cancer Mother    Emphysema Father    Melanoma Sister    Lung cancer Brother     Social History   Socioeconomic History   Marital status: Married    Spouse name: Not on file   Number of children: Not on file   Years of education: 12   Highest education level: 12th grade  Occupational History   Occupation: retired  Tobacco Use   Smoking status: Former    Current packs/day: 0.00    Average packs/day: 1.5 packs/day for 30.0 years (45.0 ttl pk-yrs)    Types: Cigarettes    Start date: 03/19/1971    Quit date: 03/18/2001    Years since quitting: 22.3   Smokeless tobacco: Never  Vaping Use   Vaping status: Never Used  Substance and Sexual Activity   Alcohol use: Yes    Comment: rare   Drug use: No   Sexual activity: Not Currently  Other Topics Concern   Not on file  Social History Narrative   Not on file   Social Drivers of Health   Financial Resource Strain: Low Risk  (06/23/2023)   Received from [facility]   Overall Financial Resource Strain (CARDIA)    Difficulty of Paying Living Expenses: Not hard at all  Food Insecurity: No Food Insecurity (06/23/2023)   Received from [facility]   Hunger Vital Sign    Worried About Running Out of Food in the Last Year: Never true    Ran Out of Food in the Last Year: Never true  Transportation Needs: No Transportation Needs (06/23/2023)   Received from [facility] - Transportation    In the past 12 months, has lack of transportation kept you from medical appointments or from getting medications?: No    Lack of Transportation (Non-Medical): No  Physical Activity: Inactive (04/19/2017)   Exercise Vital Sign    Days of Exercise per Week: 0 days    Minutes of Exercise per Session: 0 min  Stress: No  Stress Concern Present (04/19/2017)   [employer] of Occupational Health - Occupational Stress Questionnaire    Feeling of Stress : Not at all  Social Connections: Moderately Integrated (04/19/2017)   Social Connection and Isolation Panel [NHANES]    Frequency of Communication with Friends and Family: More than three times a week    Frequency of Social Gatherings with Friends and Family: More than three times a week    Attends Religious Services: More than 4 times per year    Active Member of [employer] or Organizations: No    Attends Banker Meetings: Never    Marital Status: Married    No current facility-administered medications on file prior to encounter.   Current Outpatient Medications on File Prior to Encounter  Medication Sig Dispense Refill   acetaminophen (TYLENOL) 500 MG tablet Take 1,000 mg by mouth every 6 (six) hours as  needed for moderate pain.     albuterol (VENTOLIN HFA) 108 (90 Base) MCG/ACT inhaler Inhale 2 puffs into the lungs every 4 (four) hours as needed for wheezing or shortness of breath. 8 each 6   ALPRAZolam (XANAX) 0.25 MG tablet Take 0.5 tablets (0.125 mg total) by mouth 2 (two) times daily as needed for anxiety. 20 tablet 0   amitriptyline (ELAVIL) 25 MG tablet Take 25 mg by mouth at bedtime.     aspirin EC 325 MG tablet Take 1 tablet (325 mg total) by mouth daily. (Patient not taking: Reported on 07/17/2023) 30 tablet 0   Budeson-Glycopyrrol-Formoterol (BREZTRI AEROSPHERE) 160-9-4.8 MCG/ACT AERO Inhale 2 puffs into the lungs 2 (two) times daily. 6 each 6   busPIRone (BUSPAR) 10 MG tablet Take 1 tablet by mouth 2 (two) times daily.     carvedilol (COREG) 25 MG tablet Take 25 mg by mouth 2 (two) times daily.     Docusate Calcium (STOOL SOFTENER PO) Take 2 tablets by mouth daily.     escitalopram (LEXAPRO) 10 MG tablet Take 1 tablet (10 mg total) by mouth daily. (Patient taking differently: Take 10 mg by mouth every morning.) 90 tablet 4   ezetimibe  (ZETIA) 10 MG tablet Take 10 mg by mouth every morning.     fluticasone-salmeterol (WIXELA INHUB) 250-50 MCG/ACT AEPB INHALE 1 PUFF INTO THE LUNGS IN THE MORNING AND AT BEDTIME (Patient not taking: Reported on 07/17/2023) 180 each 3   furosemide (LASIX) 20 MG tablet Take 20 mg by mouth daily.     omeprazole (PRILOSEC) 20 MG capsule TAKE 1 CAPSULE BY MOUTH DAILY AS NEEDED FOR HEARTBURN 90 capsule 1   OXYGEN Inhale 2.5-3 L into the lungs at bedtime. 3 L portable   uses [employer] for oxygen     potassium chloride (K-DUR) 10 MEQ tablet Take 1 tablet (10 mEq total) by mouth daily. (Patient not taking: Reported on 07/17/2023) 90 tablet 4   predniSONE (DELTASONE) 10 MG tablet Take 1 tablet (10 mg total) by mouth daily with breakfast. 90 tablet 5   Roflumilast 250 MCG TABS Take 1 tablet by mouth daily. 90 tablet 1   sulfamethoxazole-trimethoprim (BACTRIM DS) 800-160 MG tablet Take 1 tablet by mouth 2 (two) times daily. (Patient not taking: Reported on 07/17/2023) 30 tablet 1   theophylline (THEO-24) 100 MG 24 hr capsule Take 1 capsule (100 mg total) by mouth daily. Take 1 capsule by mouth daily. 90 capsule 1   tiotropium (SPIRIVA) 18 MCG inhalation capsule INHALE CONTENTS OF 1 CAPSULE ONCE DAILY USING HANDIHALER (Patient not taking: Reported on 07/17/2023) 30 capsule 2   traMADol (ULTRAM) 50 MG tablet Take 1-2 tablets (50-100 mg total) by mouth every 6 (six) hours as needed for moderate pain. 40 tablet 0   zinc gluconate 50 MG tablet Take 50 mg by mouth daily.      Review of Systems: a complete, 10pt review of systems was completed with pertinent positives and negatives as documented in the HPI  Physical Exam: Vitals:   07/18/23 0103 07/18/23 0148  BP:  (!) 138/92  Pulse: (!) 128 (!) 133  Resp:  (!) 29  Temp:  98.3 F (36.8 C)  SpO2: 100% 99%   Gen: Patient is on BiPAP, ill-appearing Eyes: lids and conjunctivae normal, no icterus. Pupils equally round and reactive to light.  Neck: Trachea  midline, no crepitus or hematoma Chest: respiratory effort is labored, on BiPAP currently Cardiovascular: Tachycardic to the low 120s currently  although blood pressure has improved Gastrointestinal: soft, nondistended, nontender. No mass, hepatomegaly or splenomegaly.  Muscoloskeletal: External rotation of the left lower extremity, tenderness over the pelvis Neuro: Lethargic, following some commands, eyes open to verbal stimulus Skin: warm and dry, scattered ecchymoses most prominent over the left lower arm      Latest Ref Rng & Units 07/18/2023   12:35 AM 07/18/2023   12:28 AM 07/17/2023    5:07 PM  CBC  WBC 4.0 - 10.5 K/uL  28.5  21.9   Hemoglobin 12.0 - 15.0 g/dL 9.5  9.0  19.1   Hematocrit 36.0 - 46.0 % 28.0  29.2  39.2   Platelets 150 - 400 K/uL  200  307        Latest Ref Rng & Units 07/18/2023   12:35 AM 07/18/2023   12:28 AM 07/17/2023    5:07 PM  CMP  Glucose 70 - 99 mg/dL  478  295   BUN 8 - 23 mg/dL  17  17   Creatinine 6.21 - 1.00 mg/dL  3.08  6.57   Sodium 846 - 145 mmol/L 138  136  139   Potassium 3.5 - 5.1 mmol/L 4.4  4.3  4.1   Chloride 98 - 111 mmol/L  98  97   CO2 22 - 32 mmol/L  31  34   Calcium 8.9 - 10.3 mg/dL  8.0  8.9   Total Protein 6.5 - 8.1 g/dL  4.5  6.4   Total Bilirubin 0.0 - 1.2 mg/dL  0.5  0.5   Alkaline Phos 38 - 126 U/L  35  54   AST 15 - 41 U/L  19  23   ALT 0 - 44 U/L  23  27     Lab Results  Component Value Date   INR 1.1 07/18/2023   INR 1.1 07/17/2023    Imaging: CT Angio Chest PE W and/or Wo Contrast Result Date: 07/18/2023 CLINICAL DATA:  Pulmonary embolism (PE), high prob; Abdominal trauma, blunt EXAM: CT ANGIOGRAPHY CHEST CT ABDOMEN AND PELVIS WITH CONTRAST TECHNIQUE: Multidetector CT imaging of the chest was performed using the standard protocol during bolus administration of intravenous contrast. Multiplanar CT image reconstructions and MIPs were obtained to evaluate the vascular anatomy. Multidetector CT imaging of the abdomen and  pelvis was performed using the standard protocol during bolus administration of intravenous contrast. RADIATION DOSE REDUCTION: This exam was performed according to the departmental dose-optimization program which includes automated exposure control, adjustment of the mA and/or kV according to patient size and/or use of iterative reconstruction technique. CONTRAST:  75mL OMNIPAQUE IOHEXOL 350 MG/ML SOLN COMPARISON:  CT chest 12/11/2020, CT abdomen pelvis 05/19/2019 FINDINGS: CTA CHEST FINDINGS Cardiovascular: Adequate opacification of the pulmonary arterial tree. No intraluminal filling defect identified to suggest acute pulmonary embolism. Central pulmonary arteries are of normal caliber. Moderate coronary artery calcification. Cardiac size within normal limits though enlargement of the right ventricle is noted suggesting elevated right heart pressures and there is reflux of contrast into the hepatic venous system in keeping with a least some degree of right heart failure. No pericardial effusion. Moderate atherosclerotic calcification within the thoracic aorta. No aortic aneurysm. Mediastinum/Nodes: No enlarged mediastinal, hilar, or axillary lymph nodes. Thyroid gland, trachea, and esophagus demonstrate no significant findings. Lungs/Pleura: Severe emphysema. Interval development of widespread superimposed ground-glass pulmonary infiltrate, infection versus edema. Stable scarring within the right middle lobe. Trace right and small left pleural effusions are present. No pneumothorax. Musculoskeletal: No chest wall  abnormality. No acute or significant osseous findings. Review of the MIP images confirms the above findings. CT ABDOMEN and PELVIS FINDINGS Hepatobiliary: No focal liver abnormality is seen. Status post cholecystectomy. Mild intrahepatic biliary ductal dilation in keeping with post cholecystectomy change. No extra hepatic biliary dilatation. Pancreas: Unremarkable Spleen: Unremarkable Adrenals/Urinary  Tract: The adrenal glands are unremarkable. The kidneys are normal in size and position. Simple cortical cyst is seen within the upper pole the right kidney for which no follow-up imaging is recommended. The bladder is mildly distended. There is extensive high attenuation fluid within the space of Retzius compatible with acute extraperitoneal hemorrhage related to right pubic symphyseal fracture with several small foci of active extravasation identified immediately superior to the fracture best seen on coronal image # 55/8. Stomach/Bowel: Moderate sigmoid diverticulosis. Stomach, small bowel, and large bowel are otherwise unremarkable. Appendix absent. No evidence of obstruction or focal inflammation. No free intraperitoneal gas or fluid. Vascular/Lymphatic: Aortic atherosclerosis. No enlarged abdominal or pelvic lymph nodes. Reproductive: Status post hysterectomy. No adnexal masses. Other: Tiny fat containing umbilical hernia. No free intraperitoneal fluid. Musculoskeletal: Acute fracture of the right superior pubic ramus extending into the pubic symphysis. Acute subcapital left femoral neck fracture with moderate external rotation, override, and marked varus angulation of the distal fracture fragment. Femoral head is still seated within the left acetabulum. Mild bilateral degenerative hip arthritis. There is mixed lytic and sclerotic process within the left sacral ala, new since prior examination suspicious for a insufficiency fracture with mild cortical irregularity noted. Review of the MIP images confirms the above findings. IMPRESSION: 1. No pulmonary embolism. 2. Moderate coronary artery calcification. Enlargement of the right ventricle suggesting elevated right heart pressures and there is reflux of contrast into the hepatic venous system in keeping with a least some degree of right heart failure. 3. Severe emphysema. Interval development of widespread superimposed ground-glass pulmonary infiltrate, infection  versus edema. Trace right and small left pleural effusions. 4. Acute fracture of the right superior pubic ramus extending into the pubic symphysis with extensive associated extraperitoneal hemorrhage and several small foci of active extravasation immediately superior to the fracture. 5. Acute subcapital left femoral neck fracture with moderate external rotation, override, and marked varus angulation of the distal fracture fragment. 6. Mixed lytic and sclerotic process within the left sacral ala, new since prior examination suspicious for a insufficiency fracture with mild cortical irregularity noted. This could be confirmed with MRI examination 7. Moderate sigmoid diverticulosis. Aortic Atherosclerosis (ICD10-I70.0) and Emphysema (ICD10-J43.9). Electronically Signed   By: Helyn Numbers M.D.   On: 07/18/2023 00:59   CT ABDOMEN PELVIS W CONTRAST Result Date: 07/18/2023 CLINICAL DATA:  Pulmonary embolism (PE), high prob; Abdominal trauma, blunt EXAM: CT ANGIOGRAPHY CHEST CT ABDOMEN AND PELVIS WITH CONTRAST TECHNIQUE: Multidetector CT imaging of the chest was performed using the standard protocol during bolus administration of intravenous contrast. Multiplanar CT image reconstructions and MIPs were obtained to evaluate the vascular anatomy. Multidetector CT imaging of the abdomen and pelvis was performed using the standard protocol during bolus administration of intravenous contrast. RADIATION DOSE REDUCTION: This exam was performed according to the departmental dose-optimization program which includes automated exposure control, adjustment of the mA and/or kV according to patient size and/or use of iterative reconstruction technique. CONTRAST:  75mL OMNIPAQUE IOHEXOL 350 MG/ML SOLN COMPARISON:  CT chest 12/11/2020, CT abdomen pelvis 05/19/2019 FINDINGS: CTA CHEST FINDINGS Cardiovascular: Adequate opacification of the pulmonary arterial tree. No intraluminal filling defect identified to suggest acute pulmonary  embolism.  Central pulmonary arteries are of normal caliber. Moderate coronary artery calcification. Cardiac size within normal limits though enlargement of the right ventricle is noted suggesting elevated right heart pressures and there is reflux of contrast into the hepatic venous system in keeping with a least some degree of right heart failure. No pericardial effusion. Moderate atherosclerotic calcification within the thoracic aorta. No aortic aneurysm. Mediastinum/Nodes: No enlarged mediastinal, hilar, or axillary lymph nodes. Thyroid gland, trachea, and esophagus demonstrate no significant findings. Lungs/Pleura: Severe emphysema. Interval development of widespread superimposed ground-glass pulmonary infiltrate, infection versus edema. Stable scarring within the right middle lobe. Trace right and small left pleural effusions are present. No pneumothorax. Musculoskeletal: No chest wall abnormality. No acute or significant osseous findings. Review of the MIP images confirms the above findings. CT ABDOMEN and PELVIS FINDINGS Hepatobiliary: No focal liver abnormality is seen. Status post cholecystectomy. Mild intrahepatic biliary ductal dilation in keeping with post cholecystectomy change. No extra hepatic biliary dilatation. Pancreas: Unremarkable Spleen: Unremarkable Adrenals/Urinary Tract: The adrenal glands are unremarkable. The kidneys are normal in size and position. Simple cortical cyst is seen within the upper pole the right kidney for which no follow-up imaging is recommended. The bladder is mildly distended. There is extensive high attenuation fluid within the space of Retzius compatible with acute extraperitoneal hemorrhage related to right pubic symphyseal fracture with several small foci of active extravasation identified immediately superior to the fracture best seen on coronal image # 55/8. Stomach/Bowel: Moderate sigmoid diverticulosis. Stomach, small bowel, and large bowel are otherwise  unremarkable. Appendix absent. No evidence of obstruction or focal inflammation. No free intraperitoneal gas or fluid. Vascular/Lymphatic: Aortic atherosclerosis. No enlarged abdominal or pelvic lymph nodes. Reproductive: Status post hysterectomy. No adnexal masses. Other: Tiny fat containing umbilical hernia. No free intraperitoneal fluid. Musculoskeletal: Acute fracture of the right superior pubic ramus extending into the pubic symphysis. Acute subcapital left femoral neck fracture with moderate external rotation, override, and marked varus angulation of the distal fracture fragment. Femoral head is still seated within the left acetabulum. Mild bilateral degenerative hip arthritis. There is mixed lytic and sclerotic process within the left sacral ala, new since prior examination suspicious for a insufficiency fracture with mild cortical irregularity noted. Review of the MIP images confirms the above findings. IMPRESSION: 1. No pulmonary embolism. 2. Moderate coronary artery calcification. Enlargement of the right ventricle suggesting elevated right heart pressures and there is reflux of contrast into the hepatic venous system in keeping with a least some degree of right heart failure. 3. Severe emphysema. Interval development of widespread superimposed ground-glass pulmonary infiltrate, infection versus edema. Trace right and small left pleural effusions. 4. Acute fracture of the right superior pubic ramus extending into the pubic symphysis with extensive associated extraperitoneal hemorrhage and several small foci of active extravasation immediately superior to the fracture. 5. Acute subcapital left femoral neck fracture with moderate external rotation, override, and marked varus angulation of the distal fracture fragment. 6. Mixed lytic and sclerotic process within the left sacral ala, new since prior examination suspicious for a insufficiency fracture with mild cortical irregularity noted. This could be  confirmed with MRI examination 7. Moderate sigmoid diverticulosis. Aortic Atherosclerosis (ICD10-I70.0) and Emphysema (ICD10-J43.9). Electronically Signed   By: Helyn Numbers M.D.   On: 07/18/2023 00:59   CT PELVIS WO CONTRAST Result Date: 07/17/2023 CLINICAL DATA:  Ground level fall. EXAM: CT PELVIS WITHOUT CONTRAST TECHNIQUE: Multidetector CT imaging of the pelvis was performed following the standard protocol without intravenous contrast. RADIATION DOSE REDUCTION:  This exam was performed according to the departmental dose-optimization program which includes automated exposure control, adjustment of the mA and/or kV according to patient size and/or use of iterative reconstruction technique. COMPARISON:  May 19, 2019 FINDINGS: Urinary Tract: A 3.5 cm x 11.6 cm x 9.3 cm hematoma is seen adjacent to the anterior aspect of an intact, moderate to markedly distended urinary bladder. This extends inferiorly along the region adjacent to the urinary bladder on the right, to the posterior aspect of acute comminuted fracture deformity involving the symphysis pubis on the right. Bowel: There is no evidence of bowel dilatation. Noninflamed diverticula are seen throughout the sigmoid colon. Vascular/Lymphatic: There is marked severity calcification of the visualized portion of the infrarenal abdominal aorta and bilateral common iliac arteries. No abnormal abdominal or pelvic lymph nodes are identified. Reproductive: The uterus is surgically absent. The bilateral adnexa are unremarkable. Other: A 1.5 cm diameter fat containing umbilical hernia is seen. No abdominopelvic ascites. Musculoskeletal: There is an acute, comminuted, mildly displaced fracture deformity involving the medial aspect of the right superior pubic ramus. This extends to the level of the symphysis pubis. Additional acute, nondisplaced fractures are seen involving the left superior pubic ramus, bilateral inferior pubic rami, and the head and neck of the  proximal left femur. There is no evidence of dislocation. IMPRESSION: 1. Acute, comminuted, mildly displaced fracture deformity involving the medial aspect of the right superior pubic ramus and symphysis pubis on the right. 2. Additional acute, nondisplaced fractures involving the left superior pubic ramus, bilateral inferior pubic rami, and the head and neck of the proximal left femur. 3. 3.5 cm x 11.6 cm x 9.3 cm hematoma adjacent to the anterior aspect of the urinary bladder, as described above. 4. Sigmoid diverticulosis. 5. Aortic atherosclerosis. Aortic Atherosclerosis (ICD10-I70.0). Electronically Signed   By: Aram Candela M.D.   On: 07/17/2023 19:49   DG Hip Unilat W or Wo Pelvis 2-3 Views Left Result Date: 07/17/2023 CLINICAL DATA:  Ground level fall hip injury EXAM: DG HIP (WITH OR WITHOUT PELVIS) 2-3V LEFT COMPARISON:  None Available. FINDINGS: SI joints are non widened. Pubic symphysis appears intact. Acute displaced left femoral neck fracture. No femoral head dislocation. Acute appearing mildly displaced right superior pubic ramus fracture. Linear lucency at the left inferior pubic ramus, cannot exclude nondisplaced fracture. IMPRESSION: 1. Acute displaced left femoral neck fracture. 2. Acute mildly displaced right superior pubic ramus fracture. 3. Linear lucency at the left inferior pubic ramus, cannot exclude nondisplaced fracture. Electronically Signed   By: Jasmine Pang M.D.   On: 07/17/2023 18:21   CT Cervical Spine Wo Contrast Result Date: 07/17/2023 CLINICAL DATA:  Ground level fall EXAM: CT CERVICAL SPINE WITHOUT CONTRAST TECHNIQUE: Multidetector CT imaging of the cervical spine was performed without intravenous contrast. Multiplanar CT image reconstructions were also generated. RADIATION DOSE REDUCTION: This exam was performed according to the departmental dose-optimization program which includes automated exposure control, adjustment of the mA and/or kV according to patient size  and/or use of iterative reconstruction technique. COMPARISON:  Radiograph report 12/31/2017 FINDINGS: Alignment: Significant motion degradation. Straightening of the cervical spine. Facet alignment grossly maintained Skull base and vertebrae: Limited by motion. Normal vertebral body heights. No gross fracture Soft tissues and spinal canal: No prevertebral fluid or swelling. No visible canal hematoma. Disc levels: Moderate severe diffuse disc space narrowing C3 through T1. Partial ankylosis C4-C5. Hypertrophic facet degenerative changes at multiple levels with foraminal narrowing Upper chest: Apical emphysema Other: None IMPRESSION: 1. Significant motion  degradation. No gross acute osseous abnormality. 2. Multilevel degenerative changes. 3. Emphysema Electronically Signed   By: Jasmine Pang M.D.   On: 07/17/2023 18:19   CT Head Wo Contrast Result Date: 07/17/2023 CLINICAL DATA:  Ground level fall EXAM: CT HEAD WITHOUT CONTRAST TECHNIQUE: Contiguous axial images were obtained from the base of the skull through the vertex without intravenous contrast. RADIATION DOSE REDUCTION: This exam was performed according to the departmental dose-optimization program which includes automated exposure control, adjustment of the mA and/or kV according to patient size and/or use of iterative reconstruction technique. COMPARISON:  None Available. FINDINGS: Brain: No acute territorial infarction, hemorrhage or intracranial mass. Small chronic appearing left cerebellar infarcts. Atrophy and patchy white matter hypodensity likely chronic small vessel ischemic change. Nonenlarged ventricles Vascular: No hyperdense vessel or unexpected calcification. Skull: Normal. Negative for fracture or focal lesion. Sinuses/Orbits: No acute finding. Other: None IMPRESSION: 1. No CT evidence for acute intracranial abnormality. 2. Atrophy and chronic small vessel ischemic changes of the white matter. Small chronic appearing left cerebellar infarcts.  Electronically Signed   By: Jasmine Pang M.D.   On: 07/17/2023 18:10   DG Chest Port 1 View Result Date: 07/17/2023 CLINICAL DATA:  Questionable sepsis - evaluate for abnormality Fall. EXAM: PORTABLE CHEST 1 VIEW COMPARISON:  Radiograph 04/21/2018 FINDINGS: The lungs are hyperinflated with at least moderate emphysema. Peribronchial thickening most prominent in the mid lower lung zones. Subsegmental atelectasis at the left lung base. Stable heart size and mediastinal contours. Aortic atherosclerosis. Calcified granuloma in the right lung. No pulmonary edema, pneumothorax, or large pleural effusion. Left shoulder arthroplasty. IMPRESSION: 1. Hyperinflation with at least moderate emphysema. Peribronchial thickening most prominent in the mid lower lung zones, may represent COPD exacerbation. 2. Subsegmental atelectasis at the left lung base. Electronically Signed   By: Narda Rutherford M.D.   On: 07/17/2023 17:51     A/P: 78 year old with severe COPD presents after a fall with hip fracture, pelvic fracture and extraperitoneal hematoma  -Acute respiratory failure: Currently on BiPAP, anticipate she will need escalation from current planned progressive bed admission to ICU.  Potentially fluid overloaded given volume of fluid received for her hypertension and tachycardia but will defer to primary team regarding further management of this.  I think this is the primary driver of her tachycardia/hypotension at this point.  Patient is a DNR/DNI  -Pelvic/hip fractures with extraperitoneal hematoma: Fairly extensive on CT this morning with small foci of extravasation though no targets for embolization per Dr. Madilyn Hook' discussion with IR; agree with blood transfusion (only 1 unit ordered at this point) and repeat CBC after this.  Hopefully this will tamponade.  Further management of hip/pelvic fractures per orthopedic surgery.  Trauma service will continue to follow.    Patient Active Problem List   Diagnosis Date  Noted   Status post reverse arthroplasty of shoulder, left 09/10/2020   Primary osteoarthritis of left shoulder 12/04/2019   Primary osteoarthritis of right shoulder 12/04/2019   Rotator cuff tendinitis, left 12/04/2019   Statin myopathy 11/02/2019   Umbilical hernia with obstruction, without gangrene 05/31/2019   Panlobular emphysema (HCC) 05/08/2019   Recurrent major depressive disorder, in partial remission (HCC) 05/08/2019   Obstructive chronic bronchitis with exacerbation (HCC) 01/30/2019   Oxygen dependent 01/30/2019   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) 10/19/2017   CAD (coronary artery disease), native coronary artery 10/18/2017   Hiatal hernia 10/15/2017   Abnormal EKG 10/15/2017   Dysphagia    Schatzki's ring  Gastric erosion    Advanced care planning/counseling discussion 04/21/2017   Acute on chronic respiratory failure with hypoxia (HCC) 05/08/2016   COPD exacerbation (HCC) 05/08/2016   Hyperglycemia 05/08/2016   Dyspnea 05/06/2016   Encounter for hepatitis C screening test for low risk patient 03/19/2016   Essential hypertension 03/19/2015   COPD (chronic obstructive pulmonary disease) (HCC) 03/19/2015   Hyperlipidemia 03/19/2015   Osteoarthritis of both hands 03/19/2015   Osteoporosis 03/19/2015   Acid reflux 03/19/2015   Major depression, chronic 09/12/2014       Phylliss Blakes, MD [facility]  See AMION to contact appropriate on-call provider

## 2023-07-18 NOTE — Progress Notes (Signed)
 The patient is admitted form ED to 4 E 21. She response to voice and very drowsy from previous pain medications. No s/s of pain at this moment. Staff will continue to monitor.

## 2023-07-18 NOTE — Procedures (Signed)
 Patient Name: Trenika Hudson  MRN: [id number]  Epilepsy Attending: Charlsie Quest  Referring Physician/Provider: Rolly Salter, MD  Date: 07/18/2023 Duration: 23.11 mins  Patient history: 78yo F with ams. EEG to evaluate for seizure  Level of alertness: Awake/ lethargic   AEDs during EEG study: None  Technical aspects: This EEG study was done with scalp electrodes positioned according to the 10-20 International system of electrode placement. Electrical activity was reviewed with band pass filter of 1-70Hz , sensitivity of 7 uV/mm, display speed of 63mm/sec with a 60Hz  notched filter applied as appropriate. EEG data were recorded continuously and digitally stored.  Video monitoring was available and reviewed as appropriate.  Description: EEG showed continuous generalized 3 to 6 Hz theta-delta slowing. Hyperventilation and photic stimulation were not performed.     ABNORMALITY - Continuous slow, generalized  IMPRESSION: This study is suggestive of moderate diffuse encephalopathy. No seizures or epileptiform discharges were seen throughout the recording.  Carmel Garfield Annabelle Harman

## 2023-07-18 NOTE — Plan of Care (Signed)
 Spoke with patient and her daughter regarding OR on 4/7 with Dr. Roda Shutters. Family prefers spinal if possible due to high risk for inability to extubate. Please NPO at MN.

## 2023-07-19 ENCOUNTER — Inpatient Hospital Stay (HOSPITAL_COMMUNITY)

## 2023-07-19 ENCOUNTER — Inpatient Hospital Stay (HOSPITAL_COMMUNITY): Admit: 2023-07-19 | Admitting: Orthopaedic Surgery

## 2023-07-19 ENCOUNTER — Encounter (HOSPITAL_COMMUNITY)
Admission: EM | Disposition: E | Payer: Self-pay | Source: Other Acute Inpatient Hospital | Attending: Orthopaedic Surgery

## 2023-07-19 ENCOUNTER — Inpatient Hospital Stay (HOSPITAL_COMMUNITY): Admitting: Certified Registered Nurse Anesthetist

## 2023-07-19 ENCOUNTER — Other Ambulatory Visit: Payer: Self-pay

## 2023-07-19 DIAGNOSIS — R410 Disorientation, unspecified: Secondary | ICD-10-CM

## 2023-07-19 DIAGNOSIS — J449 Chronic obstructive pulmonary disease, unspecified: Secondary | ICD-10-CM | POA: Diagnosis not present

## 2023-07-19 DIAGNOSIS — I2510 Atherosclerotic heart disease of native coronary artery without angina pectoris: Secondary | ICD-10-CM | POA: Diagnosis not present

## 2023-07-19 DIAGNOSIS — I6389 Other cerebral infarction: Secondary | ICD-10-CM

## 2023-07-19 DIAGNOSIS — J9622 Acute and chronic respiratory failure with hypercapnia: Secondary | ICD-10-CM

## 2023-07-19 DIAGNOSIS — Z01810 Encounter for preprocedural cardiovascular examination: Secondary | ICD-10-CM | POA: Diagnosis not present

## 2023-07-19 DIAGNOSIS — Z7189 Other specified counseling: Secondary | ICD-10-CM | POA: Diagnosis not present

## 2023-07-19 DIAGNOSIS — Z515 Encounter for palliative care: Secondary | ICD-10-CM | POA: Diagnosis not present

## 2023-07-19 DIAGNOSIS — S72002A Fracture of unspecified part of neck of left femur, initial encounter for closed fracture: Secondary | ICD-10-CM

## 2023-07-19 DIAGNOSIS — S72012A Unspecified intracapsular fracture of left femur, initial encounter for closed fracture: Secondary | ICD-10-CM

## 2023-07-19 DIAGNOSIS — J9621 Acute and chronic respiratory failure with hypoxia: Secondary | ICD-10-CM | POA: Diagnosis not present

## 2023-07-19 DIAGNOSIS — R0689 Other abnormalities of breathing: Secondary | ICD-10-CM

## 2023-07-19 DIAGNOSIS — S72009A Fracture of unspecified part of neck of unspecified femur, initial encounter for closed fracture: Secondary | ICD-10-CM | POA: Diagnosis present

## 2023-07-19 DIAGNOSIS — Z87891 Personal history of nicotine dependence: Secondary | ICD-10-CM

## 2023-07-19 DIAGNOSIS — I10 Essential (primary) hypertension: Secondary | ICD-10-CM

## 2023-07-19 DIAGNOSIS — I739 Peripheral vascular disease, unspecified: Secondary | ICD-10-CM

## 2023-07-19 DIAGNOSIS — E538 Deficiency of other specified B group vitamins: Secondary | ICD-10-CM | POA: Diagnosis not present

## 2023-07-19 DIAGNOSIS — J441 Chronic obstructive pulmonary disease with (acute) exacerbation: Secondary | ICD-10-CM | POA: Diagnosis not present

## 2023-07-19 DIAGNOSIS — D62 Acute posthemorrhagic anemia: Secondary | ICD-10-CM

## 2023-07-19 HISTORY — PX: TOTAL HIP ARTHROPLASTY: SHX124

## 2023-07-19 LAB — POCT I-STAT 7, (LYTES, BLD GAS, ICA,H+H)
Acid-Base Excess: 3 mmol/L — ABNORMAL HIGH (ref 0.0–2.0)
Bicarbonate: 30.5 mmol/L — ABNORMAL HIGH (ref 20.0–28.0)
Calcium, Ion: 1.28 mmol/L (ref 1.15–1.40)
HCT: 23 % — ABNORMAL LOW (ref 36.0–46.0)
Hemoglobin: 7.8 g/dL — ABNORMAL LOW (ref 12.0–15.0)
O2 Saturation: 100 %
Patient temperature: 96.8
Potassium: 4.3 mmol/L (ref 3.5–5.1)
Sodium: 142 mmol/L (ref 135–145)
TCO2: 32 mmol/L (ref 22–32)
pCO2 arterial: 63.9 mmHg — ABNORMAL HIGH (ref 32–48)
pH, Arterial: 7.282 — ABNORMAL LOW (ref 7.35–7.45)
pO2, Arterial: 468 mmHg — ABNORMAL HIGH (ref 83–108)

## 2023-07-19 LAB — CBC
HCT: 31.1 % — ABNORMAL LOW (ref 36.0–46.0)
HCT: 29 % — ABNORMAL LOW (ref 36.0–46.0)
HCT: 26.8 % — ABNORMAL LOW (ref 36.0–46.0)
Hemoglobin: 9.7 g/dL — ABNORMAL LOW (ref 12.0–15.0)
Hemoglobin: 9.1 g/dL — ABNORMAL LOW (ref 12.0–15.0)
Hemoglobin: 8 g/dL — ABNORMAL LOW (ref 12.0–15.0)
MCH: 30.2 pg (ref 26.0–34.0)
MCH: 30.5 pg (ref 26.0–34.0)
MCH: 30.3 pg (ref 26.0–34.0)
MCHC: 31.2 g/dL (ref 30.0–36.0)
MCHC: 31.4 g/dL (ref 30.0–36.0)
MCHC: 29.9 g/dL — ABNORMAL LOW (ref 30.0–36.0)
MCV: 96.9 fL (ref 80.0–100.0)
MCV: 97.3 fL (ref 80.0–100.0)
MCV: 101.5 fL — ABNORMAL HIGH (ref 80.0–100.0)
Platelets: 170 10*3/uL (ref 150–400)
Platelets: 152 10*3/uL (ref 150–400)
Platelets: 129 10*3/uL — ABNORMAL LOW (ref 150–400)
RBC: 3.21 MIL/uL — ABNORMAL LOW (ref 3.87–5.11)
RBC: 2.98 MIL/uL — ABNORMAL LOW (ref 3.87–5.11)
RBC: 2.64 MIL/uL — ABNORMAL LOW (ref 3.87–5.11)
RDW: 15.3 % (ref 11.5–15.5)
RDW: 15.2 % (ref 11.5–15.5)
RDW: 15.3 % (ref 11.5–15.5)
WBC: 24.9 10*3/uL — ABNORMAL HIGH (ref 4.0–10.5)
WBC: 23.5 10*3/uL — ABNORMAL HIGH (ref 4.0–10.5)
WBC: 26.2 10*3/uL — ABNORMAL HIGH (ref 4.0–10.5)
nRBC: 0 % (ref 0.0–0.2)
nRBC: 0 % (ref 0.0–0.2)
nRBC: 0 % (ref 0.0–0.2)

## 2023-07-19 LAB — GLUCOSE, CAPILLARY
Glucose-Capillary: 115 mg/dL — ABNORMAL HIGH (ref 70–99)
Glucose-Capillary: 126 mg/dL — ABNORMAL HIGH (ref 70–99)
Glucose-Capillary: 131 mg/dL — ABNORMAL HIGH (ref 70–99)
Glucose-Capillary: 133 mg/dL — ABNORMAL HIGH (ref 70–99)
Glucose-Capillary: 138 mg/dL — ABNORMAL HIGH (ref 70–99)
Glucose-Capillary: 146 mg/dL — ABNORMAL HIGH (ref 70–99)
Glucose-Capillary: 153 mg/dL — ABNORMAL HIGH (ref 70–99)
Glucose-Capillary: 154 mg/dL — ABNORMAL HIGH (ref 70–99)
Glucose-Capillary: 161 mg/dL — ABNORMAL HIGH (ref 70–99)
Glucose-Capillary: 95 mg/dL (ref 70–99)

## 2023-07-19 LAB — COMPREHENSIVE METABOLIC PANEL WITH GFR
ALT: 20 U/L (ref 0–44)
AST: 18 U/L (ref 15–41)
Albumin: 2.6 g/dL — ABNORMAL LOW (ref 3.5–5.0)
Alkaline Phosphatase: 36 U/L — ABNORMAL LOW (ref 38–126)
Anion gap: 12 (ref 5–15)
BUN: 33 mg/dL — ABNORMAL HIGH (ref 8–23)
CO2: 27 mmol/L (ref 22–32)
Calcium: 8.9 mg/dL (ref 8.9–10.3)
Chloride: 104 mmol/L (ref 98–111)
Creatinine, Ser: 0.87 mg/dL (ref 0.44–1.00)
GFR, Estimated: >60 mL/min (ref >60)
Glucose, Bld: 111 mg/dL — ABNORMAL HIGH (ref 70–99)
Potassium: 5 mmol/L (ref 3.5–5.1)
Sodium: 143 mmol/L (ref 135–145)
Total Bilirubin: 0.6 mg/dL (ref 0.0–1.2)
Total Protein: 4.8 g/dL — ABNORMAL LOW (ref 6.5–8.1)

## 2023-07-19 LAB — ECHOCARDIOGRAM COMPLETE
Est EF: 55
Height: 66 in
S' Lateral: 2.7 cm
Weight: 2398.6 [oz_av]

## 2023-07-19 LAB — SURGICAL PCR SCREEN
MRSA, PCR: NEGATIVE
Staphylococcus aureus: NEGATIVE

## 2023-07-19 LAB — MAGNESIUM: Magnesium: 2.2 mg/dL (ref 1.7–2.4)

## 2023-07-19 SURGERY — ARTHROPLASTY, HIP, TOTAL, ANTERIOR APPROACH
Anesthesia: General | Site: Hip | Laterality: Left

## 2023-07-19 MED ORDER — SUCCINYLCHOLINE CHLORIDE 200 MG/10ML IV SOSY
PREFILLED_SYRINGE | INTRAVENOUS | Status: DC | PRN
  Administered 2023-07-19: 100 mg via INTRAVENOUS

## 2023-07-19 MED ORDER — PHENYLEPHRINE 80 MCG/ML (10ML) SYRINGE FOR IV PUSH (FOR BLOOD PRESSURE SUPPORT)
PREFILLED_SYRINGE | INTRAVENOUS | Status: DC | PRN
  Administered 2023-07-19: 160 ug via INTRAVENOUS

## 2023-07-19 MED ORDER — POLYETHYLENE GLYCOL 3350 17 G PO PACK
17.0000 g | PACK | Freq: Every day | ORAL | Status: DC
  Administered 2023-07-20: 17 g
  Filled 2023-07-19 (×2): qty 1

## 2023-07-19 MED ORDER — CEFAZOLIN SODIUM-DEXTROSE 2-3 GM-%(50ML) IV SOLR
INTRAVENOUS | Status: DC | PRN
  Administered 2023-07-19: 2 g via INTRAVENOUS

## 2023-07-19 MED ORDER — FENTANYL CITRATE (PF) 250 MCG/5ML IJ SOLN
INTRAMUSCULAR | Status: AC
  Filled 2023-07-19: qty 5

## 2023-07-19 MED ORDER — ONDANSETRON HCL 4 MG/2ML IJ SOLN
INTRAMUSCULAR | Status: DC | PRN
  Administered 2023-07-19: 4 mg via INTRAVENOUS

## 2023-07-19 MED ORDER — METOPROLOL TARTRATE 5 MG/5ML IV SOLN
5.0000 mg | Freq: Once | INTRAVENOUS | Status: AC
  Administered 2023-07-19: 5 mg via INTRAVENOUS
  Filled 2023-07-19: qty 5

## 2023-07-19 MED ORDER — ORAL CARE MOUTH RINSE: 15.0000 mL | OROMUCOSAL | Status: DC | PRN

## 2023-07-19 MED ORDER — VANCOMYCIN HCL 1000 MG IV SOLR
INTRAVENOUS | Status: AC
  Filled 2023-07-19: qty 20

## 2023-07-19 MED ORDER — DEXAMETHASONE SODIUM PHOSPHATE 10 MG/ML IJ SOLN
INTRAMUSCULAR | Status: DC | PRN
  Administered 2023-07-19: 4 mg via INTRAVENOUS

## 2023-07-19 MED ORDER — PANTOPRAZOLE SODIUM 40 MG IV SOLR
40.0000 mg | Freq: Every day | INTRAVENOUS | Status: DC
  Administered 2023-07-19 – 2023-07-20 (×2): 40 mg via INTRAVENOUS
  Filled 2023-07-19 (×2): qty 10

## 2023-07-19 MED ORDER — SODIUM CHLORIDE 0.9 % IR SOLN
Status: DC | PRN
  Administered 2023-07-19: 1000 mL

## 2023-07-19 MED ORDER — ROCURONIUM BROMIDE 100 MG/10ML IV SOLN
INTRAVENOUS | Status: DC | PRN
  Administered 2023-07-19: 30 mg via INTRAVENOUS

## 2023-07-19 MED ORDER — THIAMINE HCL 100 MG/ML IJ SOLN
100.0000 mg | Freq: Every day | INTRAMUSCULAR | Status: DC
  Administered 2023-07-20: 100 mg via INTRAVENOUS
  Filled 2023-07-19: qty 2

## 2023-07-19 MED ORDER — HALOPERIDOL LACTATE 5 MG/ML IJ SOLN: 1.0000 mg | Freq: Four times a day (QID) | INTRAMUSCULAR | Status: DC | PRN

## 2023-07-19 MED ORDER — DOCUSATE SODIUM 50 MG/5ML PO LIQD
100.0000 mg | Freq: Two times a day (BID) | ORAL | Status: DC
  Administered 2023-07-19 – 2023-07-20 (×3): 100 mg
  Filled 2023-07-19 (×3): qty 10

## 2023-07-19 MED ORDER — SODIUM CHLORIDE 0.9 % IV SOLN: INTRAVENOUS | Status: AC | PRN

## 2023-07-19 MED ORDER — BUPIVACAINE-MELOXICAM ER 400-12 MG/14ML IJ SOLN
INTRAMUSCULAR | Status: AC
  Filled 2023-07-19: qty 1

## 2023-07-19 MED ORDER — PRONTOSAN WOUND IRRIGATION OPTIME
TOPICAL | Status: DC | PRN
  Administered 2023-07-19: 1000 mL via TOPICAL

## 2023-07-19 MED ORDER — DEXMEDETOMIDINE HCL IN NACL 400 MCG/100ML IV SOLN
0.0000 ug/kg/h | INTRAVENOUS | Status: DC
  Administered 2023-07-19: 0.4 ug/kg/h via INTRAVENOUS
  Administered 2023-07-20 (×2): 0.5 ug/kg/h via INTRAVENOUS
  Administered 2023-07-21: 0.7 ug/kg/h via INTRAVENOUS
  Filled 2023-07-19 (×4): qty 100

## 2023-07-19 MED ORDER — SODIUM CHLORIDE 0.9 % IV SOLN: INTRAVENOUS | Status: DC | PRN

## 2023-07-19 MED ORDER — ORAL CARE MOUTH RINSE
15.0000 mL | OROMUCOSAL | Status: DC
  Administered 2023-07-19 – 2023-07-21 (×20): 15 mL via OROMUCOSAL

## 2023-07-19 MED ORDER — EPHEDRINE SULFATE-NACL 50-0.9 MG/10ML-% IV SOSY
PREFILLED_SYRINGE | INTRAVENOUS | Status: DC | PRN
  Administered 2023-07-19: 15 mg via INTRAVENOUS

## 2023-07-19 MED ORDER — PROPOFOL 500 MG/50ML IV EMUL
INTRAVENOUS | Status: DC | PRN
Start: 2023-07-19 — End: 2023-07-19
  Administered 2023-07-19: 40 ug/kg/min via INTRAVENOUS

## 2023-07-19 MED ORDER — VANCOMYCIN HCL 1 G IV SOLR
INTRAVENOUS | Status: DC | PRN
  Administered 2023-07-19: 1000 mg via TOPICAL

## 2023-07-19 MED ORDER — LIDOCAINE 2% (20 MG/ML) 5 ML SYRINGE
INTRAMUSCULAR | Status: DC | PRN
Start: 2023-07-19 — End: 2023-07-19
  Administered 2023-07-19: 60 mg via INTRAVENOUS

## 2023-07-19 MED ORDER — PHENYLEPHRINE HCL-NACL 20-0.9 MG/250ML-% IV SOLN
INTRAVENOUS | Status: AC
  Filled 2023-07-19: qty 250

## 2023-07-19 MED ORDER — METOPROLOL TARTRATE 5 MG/5ML IV SOLN
5.0000 mg | INTRAVENOUS | Status: DC | PRN
  Filled 2023-07-19: qty 5

## 2023-07-19 MED ORDER — BUPIVACAINE-MELOXICAM ER 400-12 MG/14ML IJ SOLN
INTRAMUSCULAR | Status: DC | PRN
  Administered 2023-07-19: 400 mg

## 2023-07-19 MED ORDER — AMITRIPTYLINE HCL 25 MG PO TABS
25.0000 mg | ORAL_TABLET | Freq: Every day | ORAL | Status: DC
  Administered 2023-07-19 – 2023-07-20 (×2): 25 mg
  Filled 2023-07-19 (×3): qty 1

## 2023-07-19 MED ORDER — FENTANYL CITRATE PF 50 MCG/ML IJ SOSY
25.0000 ug | PREFILLED_SYRINGE | INTRAMUSCULAR | Status: DC | PRN
  Administered 2023-07-20 – 2023-07-21 (×5): 50 ug via INTRAVENOUS
  Filled 2023-07-19 (×5): qty 1

## 2023-07-19 MED ORDER — 0.9 % SODIUM CHLORIDE (POUR BTL) OPTIME
TOPICAL | Status: DC | PRN
  Administered 2023-07-19: 1000 mL

## 2023-07-19 MED ORDER — TRANEXAMIC ACID 1000 MG/10ML IV SOLN
2000.0000 mg | Freq: Once | INTRAVENOUS | Status: DC
  Filled 2023-07-19: qty 20

## 2023-07-19 MED ORDER — ALBUTEROL SULFATE (2.5 MG/3ML) 0.083% IN NEBU
INHALATION_SOLUTION | RESPIRATORY_TRACT | Status: AC
  Administered 2023-07-19: 2.5 mg
  Filled 2023-07-19: qty 3

## 2023-07-19 MED ORDER — CYANOCOBALAMIN 1000 MCG/ML IJ SOLN
1000.0000 ug | Freq: Every day | INTRAMUSCULAR | Status: AC
Start: 2023-07-19 — End: 2023-07-22
  Administered 2023-07-19 – 2023-07-21 (×3): 1000 ug via SUBCUTANEOUS
  Filled 2023-07-19 (×3): qty 1

## 2023-07-19 MED ORDER — CHLORHEXIDINE GLUCONATE CLOTH 2 % EX PADS
6.0000 | MEDICATED_PAD | Freq: Every day | CUTANEOUS | Status: DC
  Administered 2023-07-19 – 2023-07-20 (×2): 6 via TOPICAL

## 2023-07-19 MED ORDER — PHENYLEPHRINE HCL-NACL 20-0.9 MG/250ML-% IV SOLN
INTRAVENOUS | Status: DC | PRN
  Administered 2023-07-19: 25 ug/min via INTRAVENOUS

## 2023-07-19 MED ORDER — ALBUMIN HUMAN 5 % IV SOLN: INTRAVENOUS | Status: DC | PRN

## 2023-07-19 MED ORDER — FENTANYL CITRATE PF 50 MCG/ML IJ SOSY
25.0000 ug | PREFILLED_SYRINGE | INTRAMUSCULAR | Status: DC | PRN
  Administered 2023-07-20 (×2): 25 ug via INTRAVENOUS
  Filled 2023-07-19 (×2): qty 1

## 2023-07-19 MED ORDER — FENTANYL CITRATE (PF) 250 MCG/5ML IJ SOLN
INTRAMUSCULAR | Status: DC | PRN
  Administered 2023-07-19: 100 ug via INTRAVENOUS
  Administered 2023-07-19: 50 ug via INTRAVENOUS

## 2023-07-19 MED ORDER — PROPOFOL 10 MG/ML IV BOLUS
INTRAVENOUS | Status: DC | PRN
  Administered 2023-07-19: 100 mg via INTRAVENOUS

## 2023-07-19 MED ORDER — TRANEXAMIC ACID 1000 MG/10ML IV SOLN
INTRAVENOUS | Status: DC | PRN
  Administered 2023-07-19: 2000 mg via TOPICAL

## 2023-07-19 SURGICAL SUPPLY — 53 items
BAG COUNTER SPONGE SURGICOUNT (BAG) ×1 IMPLANT
BAG DECANTER FOR FLEXI CONT (MISCELLANEOUS) ×1 IMPLANT
BIPOLAR PROS AML 45 (Hips) ×1 IMPLANT
BLADE SAG 18X100X1.27 (BLADE) ×1 IMPLANT
COVER PERINEAL POST (MISCELLANEOUS) ×1 IMPLANT
COVER SURGICAL LIGHT HANDLE (MISCELLANEOUS) ×1 IMPLANT
DERMABOND ADVANCED .7 DNX12 (GAUZE/BANDAGES/DRESSINGS) IMPLANT
DRAPE C-ARM 42X72 X-RAY (DRAPES) ×1 IMPLANT
DRAPE POUCH INSTRU U-SHP 10X18 (DRAPES) ×1 IMPLANT
DRAPE STERI IOBAN 125X83 (DRAPES) ×1 IMPLANT
DRAPE U-SHAPE 47X51 STRL (DRAPES) ×2 IMPLANT
DRSG AQUACEL AG ADV 3.5X10 (GAUZE/BANDAGES/DRESSINGS) ×1 IMPLANT
DURAPREP 26ML APPLICATOR (WOUND CARE) ×2 IMPLANT
ELECT BLADE 4.0 EZ CLEAN MEGAD (MISCELLANEOUS) ×1 IMPLANT
ELECT REM PT RETURN 9FT ADLT (ELECTROSURGICAL) ×1 IMPLANT
ELECTRODE BLDE 4.0 EZ CLN MEGD (MISCELLANEOUS) ×1 IMPLANT
ELECTRODE REM PT RTRN 9FT ADLT (ELECTROSURGICAL) ×1 IMPLANT
GLOVE BIOGEL PI IND STRL 7.0 (GLOVE) ×2 IMPLANT
GLOVE BIOGEL PI IND STRL 7.5 (GLOVE) ×5 IMPLANT
GLOVE ECLIPSE 7.0 STRL STRAW (GLOVE) ×2 IMPLANT
GLOVE SKINSENSE STRL SZ7.5 (GLOVE) ×1 IMPLANT
GLOVE SURG SYN 7.5 E (GLOVE) ×2 IMPLANT
GLOVE SURG SYN 7.5 PF PI (GLOVE) ×2 IMPLANT
GLOVE SURG UNDER POLY LF SZ7 (GLOVE) ×3 IMPLANT
GLOVE SURG UNDER POLY LF SZ7.5 (GLOVE) ×2 IMPLANT
GOWN STRL REUS W/ TWL LRG LVL3 (GOWN DISPOSABLE) IMPLANT
GOWN STRL REUS W/ TWL XL LVL3 (GOWN DISPOSABLE) ×1 IMPLANT
GOWN STRL SURGICAL XL XLNG (GOWN DISPOSABLE) ×1 IMPLANT
GOWN TOGA ZIPPER T7+ PEEL AWAY (MISCELLANEOUS) ×1 IMPLANT
HEAD BIPOLAR PROS AML 45 (Hips) IMPLANT
HEAD FEM STD 28X+1.5 STRL (Hips) IMPLANT
HOOD PEEL AWAY T7 (MISCELLANEOUS) ×1 IMPLANT
IV NS IRRIG 3000ML ARTHROMATIC (IV SOLUTION) ×1 IMPLANT
KIT BASIN OR (CUSTOM PROCEDURE TRAY) ×1 IMPLANT
MARKER SKIN DUAL TIP RULER LAB (MISCELLANEOUS) ×1 IMPLANT
NDL SPNL 18GX3.5 QUINCKE PK (NEEDLE) ×1 IMPLANT
NEEDLE SPNL 18GX3.5 QUINCKE PK (NEEDLE) ×1 IMPLANT
PACK TOTAL JOINT (CUSTOM PROCEDURE TRAY) ×1 IMPLANT
PACK UNIVERSAL I (CUSTOM PROCEDURE TRAY) ×1 IMPLANT
SET HNDPC FAN SPRY TIP SCT (DISPOSABLE) ×1 IMPLANT
SOLUTION PRONTOSAN WOUND 350ML (IRRIGATION / IRRIGATOR) ×1 IMPLANT
STEM SUMMIT PRESSFIT SZ 6 (Hips) IMPLANT
SUT ETHIBOND 2 V 37 (SUTURE) ×1 IMPLANT
SUT ETHILON 2 0 FS 18 (SUTURE) IMPLANT
SUT VIC AB 0 CT1 27XBRD ANBCTR (SUTURE) ×1 IMPLANT
SUT VIC AB 1 CTX36XBRD ANBCTR (SUTURE) ×1 IMPLANT
SUT VIC AB 2-0 CT1 TAPERPNT 27 (SUTURE) ×2 IMPLANT
SYR 50ML LL SCALE MARK (SYRINGE) ×1 IMPLANT
TOWEL GREEN STERILE (TOWEL DISPOSABLE) ×1 IMPLANT
TRAY CATH INTERMITTENT SS 16FR (CATHETERS) IMPLANT
TRAY FOLEY W/BAG SLVR 16FR ST (SET/KITS/TRAYS/PACK) IMPLANT
TUBE SUCT ARGYLE STRL (TUBING) ×1 IMPLANT
YANKAUER SUCT BULB TIP NO VENT (SUCTIONS) ×1 IMPLANT

## 2023-07-19 NOTE — Progress Notes (Signed)
   07/19/23 0754  Assess: MEWS Score  Temp 98.4 F (36.9 C)  BP (!) 145/84  MAP (mmHg) 100  Pulse Rate (!) 126  ECG Heart Rate (!) 128  Resp 16  SpO2 97 %  O2 Device Bi-PAP  Assess: MEWS Score  MEWS Temp 0  MEWS Systolic 0  MEWS Pulse 2  MEWS RR 0  MEWS LOC 0  MEWS Score 2  MEWS Score Color Yellow  Assess: if the MEWS score is Yellow or Red  Were vital signs accurate and taken at a resting state? Yes  Does the patient meet 2 or more of the SIRS criteria? No  MEWS guidelines implemented  No, previously yellow, continue vital signs every 4 hours  Assess: SIRS CRITERIA  SIRS Temperature  0  SIRS Respirations  0  SIRS Pulse 1  SIRS WBC 1  SIRS Score Sum  2

## 2023-07-19 NOTE — TOC CAGE-AID Note (Signed)
 Transition of Care [facility]) - CAGE-AID Screening   Patient Details  Name: Joyce Rodriguez MRN: [id number] Date of Birth: 25-Oct-1945  Transition of Care [facility]) CM/SW Contact:    Judie Bonus, RN Phone Number: 07/19/2023, 9:16 AM   Clinical Narrative:  Pt unable to participate at this time, nonverbal with EEG report of moderate diffuse encephalopathy   CAGE-AID Screening: Substance Abuse Screening unable to be completed due to: : Patient unable to participate

## 2023-07-19 NOTE — Progress Notes (Addendum)
 NAME:  Joyce Rodriguez, MRN:  [id number], DOB:  [birth year], LOS: 1 ADMISSION DATE:  07/17/2023, CONSULTATION DATE:  07/19/2023 REFERRING MD:  Tarry Kos, MD, CHIEF COMPLAINT:  preoperative pulmonary evaluation   History of Present Illness:  Joyce Rodriguez is a 78 year old woman with past medical history of COPD on home oxygen 3 L nasal cannula, hypertension, chronic anxiety and depression who presents to the hospital after a mechanical fall.  She was brought to the ED by EMS and found to be hypoxemic and respiratory distress.  Further evaluation found pelvic and hip fractures with pelvic hematoma.  She has received some blood transfusion for anemia related to this pelvic hematoma.  COPD regimen includes Breztri, flu the last, home oxygen, it appears she is also on prednisone 10 mg daily.  Pulmonary is consulted to evaluate preoperative pulmonary evaluation in this patient with hip fracture requiring urgent left hemiarthroplasty.  She is nonverbal, tachycardic moaning this afternoon. Last received any pain meds at 5:30 this morning.   Pertinent  Medical History  Hypertension, COPD, hyperlipidemia, anxiety and depression, GERD  Significant Hospital Events: Including procedures, antibiotic start and stop dates in addition to other pertinent events   4/5 admitted post left hip fracture 4/6 pulmonary consultation deemed high risk for surgery.  Also seen by neurology for delirium and new stroke 4/7 returned to intensive care post left total hip sedated on vent requiring phenylephrine infusion for what seems to be drug related hypotension  Interim History / Subjective:  Sedated on vent  Objective   Blood pressure 136/73, pulse (!) 115, temperature (!) 96.8 F (36 C), temperature source Axillary, resp. rate 14, height 5\' 6"  (1.676 m), weight 68 kg, SpO2 98%.    Vent Mode: PRVC FiO2 (%):  [50 %-100 %] 100 % Set Rate:  [20 bmp] 20 bmp Vt Set:  [470 mL] 470 mL PEEP:  [5 cmH20] 5 cmH20 Pressure  Support:  [7 cmH20] 7 cmH20 Plateau Pressure:  [18 cmH20] 18 cmH20   Intake/Output Summary (Last 24 hours) at 07/19/2023 1659 Last data filed at 07/19/2023 1554 Gross per 24 hour  Intake 2496.54 ml  Output 400 ml  Net 2096.54 ml   Filed Weights   07/17/23 2356  Weight: 68 kg    Examination:  General 78 year old female patient currently sedated on propofol infusion post left hip arthroplasty] HEENT normocephalic atraumatic orally intubated no JVD pupils equal reactive Pulmonary: Diminished throughout no accessory use currently on full ventilator support.  Arterial blood gas pending, postoperative chest x-ray pending Regular rate and rhythm without murmur rub or gallop Abdomen: Soft nontender no organomegaly GU clear yellow Extremities warm dry left hip dressing clean dry and intact CMS with good pulses warm Neuro sedated on vent  Resolved Hospital Problem list     Assessment & Plan:   Postoperative ventilator management in a patient with acute on chronic hypoxemic and hypercapnic respiratory failure secondary to acute exacerbation chronic obstructive pulmonary disease and pneumonia Was already hypoxic and hypercarbic prior to surgery.  history of Very severe COPD on home oxygen, FEV1 25% of predicted -Deemed high risk for postoperative respiratory failure, on chronic steroids Plan Continuing nebulized Brovana, Pulmicort and Yupelri Continuing scheduled Solu-Medrol, day 1 of 5 then taper back to oral dosing Follow-up arterial blood gas Continuing full ventilator support, will hold off from weaning for tonight Follow-up portable chest x-ray Get sputum culture VAP bundle PAD protocol with RASS goal -1 Wean oxygen Continuous pulse oximetry Day #2 antibiotics currently on  ceftriaxone and doxycycline Plan for SBT in the morning assuming hemodynamically stable and delirium managed  Left femoral neck fracture with pelvic ramus fracture and pelvic hematoma. Status post left total  hip 4/7 EBL 200 plan As needed analgesia Ambulation and activity to advance per orthopedic recommendations Total hip precautions Trending CBC  Acute blood loss anemia initially secondary to pelvic hematoma Hemoglobin has been stable postsurgery on hip completed on 4/7 Plan Trend CBC Transfuse for hemoglobin less than 7 or active bleeding  Medication related hypotension Plan Keep euvolemic Trend CBC Phenylephrine for systolic blood pressure greater than 100 Try to minimize sedation as able Hold off on diuresis  Acute metabolic encephalopathy complicated by acute punctate infarct.  Superimposed on History of major depression on chronic BuSpar, Xanax and Lexapro primarily delirium at this point seen by stroke team they do not think the punctate infarct explains mental status change Plan Limit sedation as able Changing propofol to Precedex B12 supplementation Statin to start once able to take p.o. Serial neurochecks She is on chronic Xanax and Elavil.  It appears as though the Elavil is daily and the Xanax as needed.  Will resume Elavil via tube given concern for withdrawal As needed Haldol  Leukocytosis.  Suspect aspiration, but also consider steroid-induced Plan Antibiotics as above Trend CBC  Left ventricular hypertrophy with mildly reduced RV function and moderate pulmonary hypertension by echocardiogram 4/7.  Favor pH type III in the setting of her chronic respiratory failure Plan Supportive care Avoid hypoxia Diurese as needed  Urinary retention Plan Keep Foley catheter  Best Practice (right click and "Reselect all SmartList Selections" daily)   Diet/type: NPO DVT prophylaxis: Place and maintain sequential compression device Start: 07/18/23 1617 SCDs Start: 07/18/23 0513   Pressure ulcers present: N/A GI prophylaxis: PPI Lines: N/A Foley:  Yes, and it is still needed Code Status:  DNR Last date of multidisciplinary goals of care discussion [per prior  ]   My time 34 min cct  Simonne Martinet ACNP-BC [facility] Pulmonary/Critical Care Pager # [phone] OR # [phone] if no answer

## 2023-07-19 NOTE — Consult Note (Signed)
 Palliative Care Consult Note                                  Date: 07/19/2023   Patient Name: Joyce Rodriguez  DOB: Jun 10, 1945  MRN: [id number]  Age / Sex: 78 y.o., female  PCP: Marguarite Arbour, MD Referring Physician: Rolly Salter, MD  Reason for Consultation: Establishing goals of care  HPI/Patient Profile: 78 y.o. female  with past medical history of severe COPD, chronic respiratory failure, CAD, GERD, HLD, and HTN who presented to [facility] ED on 07/17/2023 after a fall at home.  She was found to have a left femoral neck fracture, bilateral pelvic fractures, and extraperitoneal hematoma. She was placed on BiPAP in the ED due to increased work of breathing. She was transferred to [facility] for orthopedics consult and hip repair.   Since admission, patient's mental status has been significantly altered from baseline. MRI brain on 4/7 showed evidence of punctuate acute infarct within the right frontal white matter and left thalamus. Per neurology, unlikely that stroke seen on MRI is sole cause of the confusion.   Palliative Medicine was consulted for GOC and medical decision making.    Subjective:   Extensive chart review has been completed including labs, vital signs, imaging, progress/consult notes, orders, medications and available advance directive documents.   Patient seen briefly at bedside. She is on the way to the OR. Appears lethargic. Remains on BiPAP.  I met with family (husband and her 4 children) to discuss diagnosis, prognosis, and GOC.   I introduced Palliative Medicine as specialized medical care for people living with serious illness. It focuses on providing relief from the symptoms and stress of a serious illness.   Created space and opportunity for family to express thoughts and feelings regarding current medical situation. Values and goals of care were attempted to be elicited.  Life Review: Patient has been married to her  husband Reuel Boom for 33 years. She has 4 children from a previous marriage; 2 sons ([place] and Amada Jupiter), and 2 daughters Misty Stanley and [place]). She also has 11 grandchildren and 17 great-grandchildren.   Functional Status: Patient lives at home with her husband. At baseline, she is ambulatory for a walker but needs help with ADLs. Family reports she has had 3 falls in the past 6 months.   GOC Discussion: We discussed patient's current illness and what it means in the larger context of her ongoing co-morbidities. Current clinical status was reviewed.   We reviewed current issues of left hip fracture and pelvic fractures, acute on chronic respiratory failure, stroke, and confusion/delirium.   We also discussed that patient has severe COPD underlying her acute issues. Reviewed natural disease trajectory of COPD as a progressive and life-limiting disease.  Family has agreed to surgical repair of the left hip fracture as they feel it is the best option for pain relief, quality of life, and to regain mobility.   Family understands patient is at high risk for post-operative respiratory failure due to severe COPD and chronic respiratory failure. Discussed the possibility she could be on a ventilator post-operatively. Family states patient would not want to be on a ventilator more than a few days.   Family is hopeful for medical stabilization and recovery post-operatively. They understand patient will need rehab, but their ultimate hope/goal is that she can return home. They report she would not want to live in a facility long-term.  Discussed the importance of continued conversation with family and the medical team regarding overall plan of care. Questions and concerns addressed. PMT contact info provided.    Review of Systems  Unable to perform ROS   Objective:   Primary Diagnoses: Present on Admission:  COPD (chronic obstructive pulmonary disease) (HCC)  CAD (coronary artery disease), native coronary  artery  Chronic respiratory failure with hypoxia and hypercapnia (HCC)  COPD exacerbation (HCC)  Essential hypertension  Hyperlipidemia  Major depression, chronic  Acute metabolic encephalopathy  Bilateral pubic rami fractures, closed, initial encounter (HCC)  Closed subcapital fracture of femur, left, initial encounter (HCC)  Hematoma of extraperitoneal space  Normocytic anemia  Sinus tachycardia  Fall   Physical Exam Vitals reviewed.  Constitutional:      General: She is not in acute distress.    Appearance: She is ill-appearing.  Pulmonary:     Comments: BiPAP Neurological:     Mental Status: She is lethargic.    Palliative Assessment/Data: PPS 20%     Assessment & Plan:   SUMMARY OF RECOMMENDATIONS   Continue current interventions Goal of care is medical stabilization and recovery  Allow time for outcomes Family understands patient will likely need rehab, but ultimate goal is for her to return home PMT will continue to follow  Primary Decision Maker: NEXT OF KIN - husband/Daniel. He includes patient's 4 children in medical decision making.   Code Status/Advance Care Planning: DNR - Limited  Symptom Management:  Per attending  Prognosis:  Unable to determine  Discharge Planning:  To Be Determined     Thank you for allowing [place] to participate in the care of Shawndell Varas   Time Total: 76 minutes  Detailed review of medical records (labs, imaging, vital signs), medically appropriate exam, discussed with treatment team, counseling and education to patient, family, & staff, documenting clinical information, coordination of care.   Signed by: Sherlean Foot, NP Palliative Medicine Team  Team Phone # [phone]  For individual providers, please see AMION

## 2023-07-19 NOTE — Discharge Instructions (Signed)
    1. Change dressings as needed 2. May shower but keep incisions covered and dry 3. Take your prescribed blood thinner to prevent blood clots 4. Take stool softeners as needed 5. Take pain meds as needed  I have reviewed the patient's history and given the presence of a fragility fracture, I have deemed the necessity of a osteoporosis management referral or confirmed that the patient is currently enrolled in a osteoporosis treatment program.

## 2023-07-19 NOTE — Transfer of Care (Signed)
 Immediate Anesthesia Transfer of Care Note  Patient: Joyce Rodriguez  Procedure(s) Performed: ARTHROPLASTY, HIP, TOTAL, ANTERIOR APPROACH (Left: Hip)  Patient Location: ICU  Anesthesia Type:General  Level of Consciousness: sedated and Patient remains intubated per anesthesia plan  Airway & Oxygen Therapy: Patient remains intubated per anesthesia plan and Patient placed on Ventilator (see vital sign flow sheet for setting)  Post-op Assessment: Report given to RN and Post -op Vital signs reviewed and stable  Post vital signs: Reviewed and stable  Last Vitals:  Vitals Value Taken Time  BP 153/101   Temp    Pulse 103 07/19/23 1549  Resp 16 07/19/23 1551  SpO2 99 % 07/19/23 1549  Vitals shown include unfiled device data.  Last Pain:  Vitals:   07/19/23 1138  TempSrc: Oral  PainSc:       Patients Stated Pain Goal: Other (Comment) (07/18/23 2022)  Complications: No notable events documented.

## 2023-07-19 NOTE — Consult Note (Signed)
 NEUROLOGY CONSULT NOTE   Date of service: July 19, 2023 Patient Name: Joyce Rodriguez MRN:  [id number] DOB:  1946/01/20 Chief Complaint: "confusion" Requesting Provider: Rolly Salter, MD  History of Present Illness  Selby Slovacek is a 78 y.o. female with past medical history significant for COPD, chronic respiratory failure, CAD, HLD, HTN, GERD who presented to the hospital 4/5 status post fall.  Patient has continued to be confused after the fall and she is being treated for acute metabolic encephalopathy.  MRI brain shows punctate infarcts in the left thalamus and right frontal lobe.  Neurology consulted for strokes seen on MRI and continued acute altered mental status.   She has bilateral pubic rami fractures (closed) and a subcapital fracture of the neck of her left femur.  She is due for surgery today with Ortho.  Family at bedside.  Daughter states that patient is normally completely oriented and is able to handle all ADLs on her own.  She does use a cane or sometimes a walker at home.  Daughter states that patient was a little confused the night before her fall when she was speaking to her on the phone, but that the confusion has worsened since she has been in the hospital especially after she is given pain meds.   ROS   Unable to ascertain due to AMS  Past History   Past Medical History:  Diagnosis Date   Aortic atherosclerosis (HCC)    Arthritis    Complication of anesthesia    difficulty waking up   COPD (chronic obstructive pulmonary disease) (HCC)    Coronary artery disease    Dyspnea    Emphysema of lung (HCC)    SEVERE   GERD (gastroesophageal reflux disease)    Headache    H/O   History of hiatal hernia    Hyperlipidemia    Hypertension    H/O OFF MEDS SINCE 2020 BY PCP   Osteoporosis    Oxygen deficiency    uses O2 at night   Oxygen dependent    Pneumonia    Umbilical hernia     Past Surgical History:  Procedure Laterality Date   ABDOMINAL  HYSTERECTOMY     CHOLECYSTECTOMY     ESOPHAGEAL MANOMETRY N/A 09/13/2017   Procedure: ESOPHAGEAL MANOMETRY (EM);  Surgeon: Napoleon Form, MD;  Location: [facility];  Service: Endoscopy;  Laterality: N/A;   ESOPHAGOGASTRODUODENOSCOPY (EGD) WITH PROPOFOL N/A 05/11/2017   Procedure: ESOPHAGOGASTRODUODENOSCOPY (EGD) WITH PROPOFOL;  Surgeon: Toney Reil, MD;  Location: [facility] ENDOSCOPY;  Service: Gastroenterology;  Laterality: N/A;   EYE SURGERY Bilateral    CATARACTS   FOOT SURGERY     MORTONS NEUROMA   LAPAROSCOPIC NISSEN FUNDOPLICATION N/A 10/19/2017   Procedure: LAPAROSCOPIC NISSEN FUNDOPLICATION;  Surgeon: Leafy Ro, MD;  Location: [facility] ORS;  Service: General;  Laterality: N/A;   NISSEN FUNDOPLICATION N/A 10/19/2017   Procedure: NISSEN FUNDOPLICATION;  Surgeon: Leafy Ro, MD;  Location: [facility] ORS;  Service: General;  Laterality: N/A;   REVERSE SHOULDER ARTHROPLASTY Left 09/10/2020   Procedure: REVERSE SHOULDER ARTHROPLASTY;  Surgeon: Christena Flake, MD;  Location: [facility] ORS;  Service: Orthopedics;  Laterality: Left;    Family History: Family History  Problem Relation Age of Onset   Ovarian cancer Mother    Emphysema Father    Melanoma Sister    Lung cancer Brother     Social History  reports that she quit smoking about 22 years ago. Her smoking use included cigarettes. She  started smoking about 52 years ago. She has a 45 pack-year smoking history. She has never used smokeless tobacco. She reports current alcohol use. She reports that she does not use drugs.  Allergies  Allergen Reactions   Atorvastatin Other (See Comments)    Myalgias made legs and back hurt    Atrovent Nasal Spray [Ipratropium]     Difficulty breathing   Codeine Nausea And Vomiting    Pt states that she can tolerate Tramadol fine.   Lipitor [Atorvastatin Calcium] Other (See Comments)    Myalgias    Medications   Current Facility-Administered Medications:    acetaminophen (TYLENOL)  tablet 650 mg, 650 mg, Oral, Q6H PRN **OR** acetaminophen (TYLENOL) suppository 650 mg, 650 mg, Rectal, Q6H PRN, Rolly Salter, MD, 650 mg at 07/18/23 1631   arformoterol (BROVANA) nebulizer solution 15 mcg, 15 mcg, Nebulization, BID, Rolly Salter, MD, 15 mcg at 07/19/23 0859   budesonide (PULMICORT) nebulizer solution 0.25 mg, 0.25 mg, Nebulization, BID, Rolly Salter, MD, 0.25 mg at 07/19/23 0859   cefTRIAXone (ROCEPHIN) 2 g in sodium chloride 0.9 % 100 mL IVPB, 2 g, Intravenous, Q24H, Hall, Carole N, DO, Last Rate: 200 mL/hr at 07/19/23 0128, 2 g at 07/19/23 0128   cyanocobalamin (VITAMIN B12) injection 1,000 mcg, 1,000 mcg, Subcutaneous, Q0600, Rolly Salter, MD, 1,000 mcg at 07/19/23 0955   doxycycline (VIBRAMYCIN) 100 mg in sodium chloride 0.9 % 250 mL IVPB, 100 mg, Intravenous, Q12H, Hall, Carole N, DO, Last Rate: 125 mL/hr at 07/19/23 0823, 100 mg at 07/19/23 0823   HYDROmorphone (DILAUDID) injection 0.5 mg, 0.5 mg, Intravenous, Q4H PRN, Rolly Salter, MD, 0.5 mg at 07/19/23 0521   insulin aspart (novoLOG) injection 0-9 Units, 0-9 Units, Subcutaneous, Q4H, Hall, Carole N, DO, 1 Units at 07/19/23 0459   ketorolac (TORADOL) 15 MG/ML injection 15 mg, 15 mg, Intravenous, Q6H, Rolly Salter, MD, 15 mg at 07/19/23 0458   lactated ringers infusion, , Intravenous, Continuous, Rolly Salter, MD, Last Rate: 75 mL/hr at 07/19/23 0334, New Bag at 07/19/23 0334   methylPREDNISolone sodium succinate (SOLU-MEDROL) 40 mg/mL injection 40 mg, 40 mg, Intravenous, Daily, Rolly Salter, MD, 40 mg at 07/19/23 0818   metoprolol tartrate (LOPRESSOR) injection 2.5 mg, 2.5 mg, Intravenous, Q6H PRN, Rolly Salter, MD, 2.5 mg at 07/19/23 0819   ondansetron (ZOFRAN) tablet 4 mg, 4 mg, Oral, Q6H PRN **OR** ondansetron (ZOFRAN) injection 4 mg, 4 mg, Intravenous, Q6H PRN, Rolly Salter, MD   polyethylene glycol (MIRALAX / GLYCOLAX) packet 17 g, 17 g, Oral, Daily PRN, Rolly Salter, MD   revefenacin  (YUPELRI) nebulizer solution 175 mcg, 175 mcg, Nebulization, Daily, Rolly Salter, MD, 175 mcg at 07/19/23 0859   [START ON 07/20/2023] thiamine (VITAMIN B1) injection 100 mg, 100 mg, Intravenous, Daily, Rolly Salter, MD  Vitals   Vitals:   07/19/23 0105 07/19/23 0123 07/19/23 0251 07/19/23 0754  BP: 123/64  127/76 (!) 145/84  Pulse: (!) 133 (!) 113 (!) 122 (!) 126  Resp: 13 15 20 16   Temp:    98.4 F (36.9 C)  TempSrc:    Axillary  SpO2: 94% 97% 96% 97%  Weight:      Height:        Body mass index is 24.2 kg/m.  Physical Exam   Constitutional: Appears acutely ill Cardiovascular: Normal rate and regular rhythm.  Respiratory:  Irregular breathing on BiPAP  Neurologic Examination   Patient is lethargic but  opens eyes to voice.  She does turn head and looking examiner on command.  She is wearing BiPAP.  Pupils are reactive bilaterally, eyes are midline. She was able to wiggle toes bilaterally on command.  Generalized weakness present.  She moves both arms spontaneously as well as both legs.  Labs/Imaging/Neurodiagnostic studies   CBC:  Recent Labs  Lab 08-05-2023 1707 07/18/23 0028 07/18/23 0035 07/18/23 1746 07/19/23 0126  WBC 21.9* 28.5*   < > 24.3* 24.9*  NEUTROABS 18.2* 27.1*  --   --   --   HGB 12.0 9.0*   < > 10.5* 9.7*  HCT 39.2 29.2*   < > 33.3* 31.1*  MCV 101.3* 100.7*   < > 95.7 96.9  PLT 307 200   < > 169 170   < > = values in this interval not displayed.   Basic Metabolic Panel:  Lab Results  Component Value Date   NA 143 07/19/2023   K 5.0 07/19/2023   CO2 27 07/19/2023   GLUCOSE 111 (H) 07/19/2023   BUN 33 (H) 07/19/2023   CREATININE 0.87 07/19/2023   CALCIUM 8.9 07/19/2023   GFRNONAA >60 07/19/2023   GFRAA 85 01/23/2019   Lipid Panel:  Lab Results  Component Value Date   LDLCALC 130 (H) 01/23/2019   HgbA1c:  Lab Results  Component Value Date   HGBA1C 5.7 (H) 07/18/2023   Urine Drug Screen: No results found for: "LABOPIA",  "COCAINSCRNUR", "LABBENZ", "AMPHETMU", "THCU", "LABBARB"  Alcohol Level No results found for: "ETH" INR  Lab Results  Component Value Date   INR 1.1 07/18/2023   APTT No results found for: "APTT" AED levels: No results found for: "PHENYTOIN", "ZONISAMIDE", "LAMOTRIGINE", "LEVETIRACETA"  CT angio Head and Neck with contrast(Personally reviewed): PENDING  MRI Brain(Personally reviewed): Right frontal and left thalamic punctate acute infarcts No hemorrhage or mass effect 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 No seizures or epileptiform discharges were seen throughout the recording.   ASSESSMENT   Mikiyah Glasner is a 78 y.o. female with past medical history significant for COPD, chronic respiratory failure, CAD, HLD, HTN, GERD who presented to the hospital 4/5 status post fall.   MRI brain shows punctate infarcts in bilateral cerebral hemispheres as well as brainstem  She has bilateral pubic rami fractures (closed) and a subcapital fracture of the neck of her left femur.  She is due for surgery today with Ortho.  She has multiple reasons for acute confusion/acute delirium this admission, including B12 deficiency, hypercapnia, use of pain medications including Fentanyl and dilaudid.  Unlikely that stroke seen on MRI is sole cause of the confusion.   RECOMMENDATIONS   - Continue B12 supplementation - Wean pain meds as able - Limit use of benzos and all deliriogenic medications - Avoid hypotension and dehydration - continue electrolyte management - day/night interventions, sleep schedules - continue stroke workup, as ordered - Frequent Neuro checks per stroke unit protocol - Vascular imaging - CT Angio - TTE - Lipid panel - Statin - will be started if LDL>70 or otherwise medically indicated - A1C - Antithrombotic - Aspirin and plavix for 3 weeks, then aspirin alone to  start after surgery - DVT ppx - SCDs, lovenox when cleared post-op - SBP goal - <220, PRN labetalol if HR>60 and PRN Hydralazine if HR<60 - Telemetry monitoring for arrhythmia  - Swallow screen - will be performed prior to PO intake -  Stroke education - will be given - PT/OT/SLP  ______________________________________________________________________   Pt seen by Neuro NP/APP and later by MD. Note/plan to be edited by MD as needed.    Lynnae January, DNP, AGACNP-BC [facility] Please use AMION for contact information & EPIC for messaging.  I have seen the patient and reviewed the above note.  Certainly in the setting of multiple fractures, with multiple punctate strokes, fat emboli as a possible etiology.  Decompensated small vessel disease would also be a consideration as would other embolic source.  She will need further workup as outlined above.  As to the safety of surgery, there may be some mildly increased risk if the patient were to become hypotensive as can happen with spinal anesthesia.  I think the risk of not addressing her underlying issues of pain, requiring more pain medications, etc. would also have significant risk.  I would favor not using the stroke as a contraindication to surgery.  Neurology to follow  Ritta Slot, MD [facility]   If 7pm- 7am, please page neurology on call as listed in AMION.

## 2023-07-19 NOTE — Progress Notes (Addendum)
 [facility] Progress Note Patient: Joyce Rodriguez WUJ:[id number] DOB: 03-09-1946 DOA: 07/17/2023  DOS: the patient was seen and examined on 07/19/2023  Brief Hospital Course: PMH of severe COPD, chronic respiratory failure, CAD, GERD, HLD, HTN presented to the hospital with complaints of a fall.  After the fall the patient has remained confused and currently being treated for acute metabolic encephalopathy. Secondary to fall is found to have subcapital fracture neck of left femur, bilateral pubic ramus fracture as well as fracture of the right side of the pubic symphysis along with extraperitoneal hematoma around the pubic symphysis, 3.5 cm x 11.6 cm x 9.3 cm hematoma adjacent to the anterior aspect of the urinary bladder.  Trauma surgery, orthopedic surgery, pulmonary consulted. MRI brain came back positive for punctate infarct, neurology consulted.  Assessment and Plan: Subcapital fracture of neck of left femur Bilateral pubic rami fractures, closed, initial encounter The patient presented with a mechanical fall. She appears to have sustained multiple fractures including bilateral pubic rami fracture as well as involvement of the pubic symphysis and subtotal fracture of the left femur. Patient originally presented to [facility] ED.  From there after consultation with the orthopedic surgery was transferred to [facility]. Dr. Steward Drone and Dr. Roda Shutters are consulted. Surgery currently understand the patient will be moderate to high risk most likely for respiratory outcomes.  As of right now no prohibitive risk for the surgery.  Hematoma of extraperitoneal space Appreciate trauma surgery consultation. Monitoring H&H.  Reassuringly hemoglobin is stable. Patient received 1 PRBC transfusion already. Transfuse hemoglobin less than 8 or hemodynamic instability.  Acute metabolic encephalopathy Acute punctate infarct. B12 deficiency Mentation is significantly altered from baseline. At baseline able to care for self  ambulates with a walker. Currently unable to verbalize, unable to follow commands.  Appears to be severely delirious. CT of the head is unremarkable for any acute abnormality. Does not appear that the suffering from any focal deficit. MRI brain ordered.  Which showed evidence of punctate acute infarct within the right frontal white matter and left thalamus.  Neurology consulted. EEG negative for any active seizures. ABG while shows hypercarbia does not show any evidence of decompensation.  Folate 13.8, B12 162, ammonia 29, free T4 0.9, TSH 0.37. Check B1 and initiate B12 therapy. Neurology consulted for acute stroke as well as for encephalopathy. At present neurology recommending to ensure that the blood pressure remains stable perioperatively but currently no prohibitive risk for the surgery for spinal anesthesia.  Preoperative medical evaluation Nolon Nations al. Estimated Risk Probability for Perioperative Myocardial Infarction or Cardiac Arrest: 1.2%  Pt can climb a flight of stair, ambulates around house using walker device, without any shortness of breath or chest pain. Stress test in 2021  negative for Has h/o severe COPD on 3 LPM Based on RCRI 30-day risk of death, MI, or cardiac arrest is 6 % Perioperative respiratory failure rate requiring prolonged ventilation 5 to 10% Echocardiogram ordered and pending, although given negative stress test in 2021, would not pose any prohibitive risk for the surgery.  Long discussion with husband and daughter at bedside, updated them about the new information of stroke and increased risk of stroke perioperatively.  They understands patient will be at moderate to high risk for the surgical procedure. Alternative surgical intervention were discussed. They accepts all risks and wants to proceed with surgical intervention.   COPD (chronic obstructive pulmonary disease) Chronic respiratory failure with hypoxia and hypercapnia COPD exacerbation  (HCC) Procalcitonin is mildly elevated. Patient is with  severe respite distress, currently requiring BiPAP therapy. Will treat as presumed COPD exacerbation with IV antibiotics, IV steroids and nebulization therapy and monitor. Appreciate pulmonary assistance.  Sinus tachycardia Likely secondary to pain. Echocardiogram ordered and pending.  Lopressor as needed. TSH and free T4 normal. Hemoglobin remained stable.  Fall Appears to be mechanical based on the history provided by husband.  She was trying to walk into her bathroom with a walker and then switched to cane and had a fall. Monitor  Urinary retention Likely secondary combination of delirium as well as hematoma.  Foley catheter inserted. Monitor.  Major depression, chronic On BuSpar, Xanax, Lexapro Monitor for now, judicious use of IV Ativan as needed.  Essential hypertension Blood pressure is mildly elevated. On Lopressor as needed.  Hyperlipidemia Continue statin once able to take p.o.  CAD (coronary artery disease), native coronary artery Stress test in 2021 was negative for any acute ischemia. CT chest shows moderate calcification in coronary arteries.  Normocytic anemia H&H relatively stable.  Received 1 PRBC transfusion in ER. Monitor.  Goals of care conversation. DNR present on admission Discussed with husband.  Patient remains DNR/DNI.Marland Kitchen Prognosis is very poor regardless of the decision given high risk for prolong ventilator needs after surgery as well as severe delirium before the surgery. Will monitor.  Leukocytosis Likely stress reaction. For now we will monitor On antibiotics.  Lactic acidosis Currently receiving IV fluid. Monitor with     Addendum: After surgery, patient remained intubated, was transferred to ICU.  I notified ICU team.  [facility] will sign off since the care will be transferred to the ICU team.  Highly appreciate their assistance.   Patient remains intubated.  On propofol.  Edsel Petrin  Rowyn Mustapha 5:06 PM 07/19/2023   Subjective: Mentation unchanged.  Unable to follow any commands.  Unable to verbalize anything.  Moving extremities and had spontaneously.  No other acute events overnight. Physical Exam: General: in moderate distress, No Rash Cardiovascular: S1 and S2 Present, No Murmur Respiratory: Good respiratory effort, Bilateral Air entry present. No Crackles, No wheezes Abdomen: Bowel Sound present, No tenderness Extremities: No edema Neuro: Alert, nonverbal, unable to follow any commands, withdraws to painful stimuli, pupils are equal and reactive to light.  Data Reviewed: I have Reviewed nursing notes, Vitals, and Lab results. Since last encounter, pertinent lab results CBC and BMP   . I have ordered test including CBC and BMP  . I have discussed pt's care plan and test results with orthopedic surgery as well as neuro  .   Disposition: Status is: Inpatient Remains inpatient appropriate because: Monitor for improvement of mentation and postop recovery.  Place and maintain sequential compression device Start: 07/18/23 1617 SCDs Start: 07/18/23 0513   Family Communication: Family at bedside. Level of care: Progressive   Vitals:   07/19/23 0105 07/19/23 0123 07/19/23 0251 07/19/23 0754  BP: 123/64  127/76 (!) 145/84  Pulse: (!) 133 (!) 113 (!) 122 (!) 126  Resp: 13 15 20 16   Temp:    98.4 F (36.9 C)  TempSrc:    Axillary  SpO2: 94% 97% 96% 97%  Weight:      Height:        The patient is critically ill with multiple organ systems failure and requires high complexity decision making for assessment and support, frequent evaluation and titration of therapies. Critical Care Time devoted to patient care services described in this note is 35 minutes  Author: Lynden Oxford, MD 07/19/2023 9:37 AM  Please look  on [email or web address] to find out who is on call.

## 2023-07-19 NOTE — Anesthesia Procedure Notes (Signed)
 Procedure Name: Intubation Date/Time: 07/19/2023 1:33 PM  Performed by: Randa Evens, CRNAPre-anesthesia Checklist: Patient identified, Emergency Drugs available, Suction available and Patient being monitored Patient Re-evaluated:Patient Re-evaluated prior to induction Oxygen Delivery Method: Circle System Utilized Preoxygenation: Pre-oxygenation with 100% oxygen Induction Type: IV induction Ventilation: Mask ventilation without difficulty Laryngoscope Size: Glidescope and 3 Grade View: Grade I Tube type: Oral Tube size: 7.0 mm Number of attempts: 1 Airway Equipment and Method: Stylet and Oral airway Placement Confirmation: ETT inserted through vocal cords under direct vision, positive ETCO2 and breath sounds checked- equal and bilateral Secured at: 21 cm Tube secured with: Tape Dental Injury: Teeth and Oropharynx as per pre-operative assessment

## 2023-07-19 NOTE — Op Note (Signed)
 ARTHROPLASTY, HIP, TOTAL, ANTERIOR APPROACH  Procedure Note Joyce Rodriguez   [id number]  Pre-op Diagnosis: Left displaced femoral neck fracture     Post-op Diagnosis: same   Operative Procedures  1. Prosthetic replacement for femoral neck fracture. CPT [phone]  Operative Findings 1. Acute subcapital femoral neck fracture  Personnel  Gershon Mussel, M.D.  ASSIST: Oneal Grout, [place]   Anesthesia: general  Prosthesis: Depuy Femur: Summit basic 6 Head: 45/28 mm size: +1.5 Bearing Type: bipolar  Hip Hemiarthroplasty (Anterior Approach) Op Note:  After informed consent was obtained and the operative extremity marked in the holding area, the patient was brought back to the operating room and placed supine on the HANA table. Next, the operative extremity was prepped and draped in normal sterile fashion. Surgical timeout occurred verifying patient identification, surgical site, surgical procedure and administration of antibiotics.  A 10 cm longitudinal incision was made starting from 2 fingerbreadths lateral and inferior to the ASIS towards the lateral aspect of the patella.  A Hueter approach to the hip was performed, using the interval between tensor fascia lata and sartorius.  Dissection was carried bluntly down onto the anterior hip capsule. The lateral femoral circumflex vessels were identified and coagulated. A capsulotomy was performed and fracture hematoma was evacuated and the capsular flaps tagged for later repair.  The neck osteotomy was performed below the fracture about 1 fingerbreadth above the lesser trochanter. The femoral head was removed and found a 45 mm head was the appropriate fit.    We then turned our attention to the femur.  After placing the femoral hook, the leg was taken to externally rotated, extended and adducted position taking care to perform soft tissue releases to allow for adequate mobilization of the femur. Soft tissue was cleared from the shoulder of the  greater trochanter and the hook elevator used to improve exposure of the proximal femur. Sequential broaching performed up to a size 6. Trial neck and head were placed. The leg was brought back up to neutral and the construct reduced. The position and sizing of components, offset and leg lengths were checked using fluoroscopy. Stability of the construct was checked in extension and external rotation without any subluxation or impingement of prosthesis. We dislocated the prosthesis, dropped the leg back into position, removed trial components, and irrigated copiously. The final stem and head was then placed, the leg brought back up, the system reduced and fluoroscopy used to verify positioning.  We irrigated, obtained hemostasis and closed the capsule using #2 ethibond suture.  The fascia was closed with #1 stratafix, the deep fat layer was closed with 0 vicryl, the subcutaneous layers closed with 2.0 Vicryl Plus and the skin closed with 2.0 nylon. A sterile dressing was applied. The patient was awakened in the operating room and taken to recovery in stable condition. All sponge, needle, and instrument counts were correct at the end of the case.   Tessa Lerner, my PA, was necessary for opening, closing, exposing, retracting, limb positioning and overall facilitation and completion of the surgery.  Position: supine  Complications: see description of procedure.  Time Out: performed   Drains/Packing: none  Estimated blood loss: see anesthesia record  Returned to Recovery Room: in good condition.   Antibiotics: yes   Mechanical VTE (DVT) Prophylaxis: sequential compression devices, TED thigh-high  Chemical VTE (DVT) Prophylaxis: lovenox  Fluid Replacement: Crystalloid: see anesthesia record  Specimens Removed: 1 to pathology   Sponge and Instrument Count Correct? yes   PACU:  portable radiograph - low AP   Admission: inpatient status, start PT & OT POD#1  Plan/RTC: Return in 2 weeks for  staple removal. Return in 6 weeks to see MD.  Weight Bearing/Load Lower Extremity: full  Hip precautions: none  N. Glee Arvin, MD Aldean Baker 2:32 PM   Implant Name Type Inv. Item Serial No. Manufacturer Lot No. LRB No. Used Action  HEAD FEM STD 28X+1.5 STRL - [id number] Hips HEAD FEM STD 28X+1.5 STRL  [employer] [id number] Left 1 Implanted  BIPOLAR PROS AML 45 - [id number] Hips BIPOLAR PROS AML 45  [employer] [id number] Left 1 Implanted  STEM SUMMIT PRESSFIT SZ 6 - [id number] Hips STEM SUMMIT PRESSFIT SZ 6  [employer] [id number] Left 1 Implanted

## 2023-07-19 NOTE — Progress Notes (Signed)
 Came by to see the patient but she had already been taken to the OR. There appears to be a family meeting with palliative care going on with family at present.  Noted pulmonary consult stating 42.1%  risk of perioperative pulmonary complication.   hgb remains down-trending, type and screen is active if she needs any blood in the perioperative period.   Trauma team will plan to round on the patient in the morning to ensure hgb is stabilizing.   Hosie Spangle, PA-C [facility] Please see Amion for pager number during day hours 7:00am-4:30pm

## 2023-07-19 NOTE — Plan of Care (Signed)
   Problem: Nutrition: Goal: Adequate nutrition will be maintained Outcome: Not Progressing

## 2023-07-19 NOTE — Anesthesia Preprocedure Evaluation (Addendum)
 Anesthesia Evaluation  Patient identified by MRN, date of birth, ID band Patient confused  General Assessment Comment:Pt on BiPAP  Reviewed: Allergy & Precautions, H&P , NPO status , Patient's Chart, lab work & pertinent test results  Airway Mallampati: II   Neck ROM: full    Dental   Pulmonary shortness of breath, COPD,  oxygen dependent, former smoker Acute respiratory decline during this hospital admission.  Pt is on BiPAP   breath sounds clear to auscultation       Cardiovascular hypertension, + CAD   Rhythm:regular Rate:Normal     Neuro/Psych  Headaches PSYCHIATRIC DISORDERS  Depression     Neuromuscular disease    GI/Hepatic hiatal hernia, PUD,GERD  ,,  Endo/Other    Renal/GU      Musculoskeletal  (+) Arthritis ,    Abdominal   Peds  Hematology   Anesthesia Other Findings   Reproductive/Obstetrics                             Anesthesia Physical Anesthesia Plan  ASA: 4  Anesthesia Plan: General   Post-op Pain Management:    Induction: Intravenous  PONV Risk Score and Plan: 3 and Ondansetron, Dexamethasone and Treatment may vary due to age or medical condition  Airway Management Planned: Oral ETT  Additional Equipment:   Intra-op Plan:   Post-operative Plan: Possible Post-op intubation/ventilation  Informed Consent: I have reviewed the patients History and Physical, chart, labs and discussed the procedure including the risks, benefits and alternatives for the proposed anesthesia with the patient or authorized representative who has indicated his/her understanding and acceptance.     Dental advisory given  Plan Discussed with: CRNA, Anesthesiologist and Surgeon  Anesthesia Plan Comments:        Anesthesia Quick Evaluation

## 2023-07-19 NOTE — Plan of Care (Signed)
 The patient remains on MC-55M at time of writing. The patient remains intubated and sedated with Precedex. No vasopressor support at time of writing. PIVs only. Foley catheter remains in place. Plan for WUA in the AM.   Problem: Education: Goal: Ability to describe self-care measures that may prevent or decrease complications (Diabetes Survival Skills Education) will improve Outcome: Progressing Goal: Individualized Educational Video(s) Outcome: Progressing   Problem: Coping: Goal: Ability to adjust to condition or change in health will improve Outcome: Progressing   Problem: Fluid Volume: Goal: Ability to maintain a balanced intake and output will improve Outcome: Progressing   Problem: Health Behavior/Discharge Planning: Goal: Ability to identify and utilize available resources and services will improve Outcome: Progressing Goal: Ability to manage health-related needs will improve Outcome: Progressing   Problem: Metabolic: Goal: Ability to maintain appropriate glucose levels will improve Outcome: Progressing   Problem: Nutritional: Goal: Maintenance of adequate nutrition will improve Outcome: Progressing Goal: Progress toward achieving an optimal weight will improve Outcome: Progressing   Problem: Skin Integrity: Goal: Risk for impaired skin integrity will decrease Outcome: Progressing   Problem: Tissue Perfusion: Goal: Adequacy of tissue perfusion will improve Outcome: Progressing   Problem: Education: Goal: Knowledge of General Education information will improve Description: Including pain rating scale, medication(s)/side effects and non-pharmacologic comfort measures Outcome: Progressing   Problem: Health Behavior/Discharge Planning: Goal: Ability to manage health-related needs will improve Outcome: Progressing   Problem: Clinical Measurements: Goal: Ability to maintain clinical measurements within normal limits will improve Outcome: Progressing Goal: Will  remain free from infection Outcome: Progressing Goal: Diagnostic test results will improve Outcome: Progressing Goal: Respiratory complications will improve Outcome: Progressing Goal: Cardiovascular complication will be avoided Outcome: Progressing   Problem: Activity: Goal: Risk for activity intolerance will decrease Outcome: Progressing   Problem: Nutrition: Goal: Adequate nutrition will be maintained Outcome: Progressing   Problem: Coping: Goal: Level of anxiety will decrease Outcome: Progressing   Problem: Elimination: Goal: Will not experience complications related to bowel motility Outcome: Progressing Goal: Will not experience complications related to urinary retention Outcome: Progressing   Problem: Pain Managment: Goal: General experience of comfort will improve and/or be controlled Outcome: Progressing   Problem: Safety: Goal: Ability to remain free from injury will improve Outcome: Progressing   Problem: Skin Integrity: Goal: Risk for impaired skin integrity will decrease Outcome: Progressing   Problem: Activity: Goal: Ability to tolerate increased activity will improve Outcome: Progressing   Problem: Respiratory: Goal: Ability to maintain a clear airway and adequate ventilation will improve Outcome: Progressing   Problem: Role Relationship: Goal: Method of communication will improve Outcome: Progressing   Problem: Activity: Goal: Ability to tolerate increased activity will improve Outcome: Progressing   Problem: Respiratory: Goal: Ability to maintain a clear airway and adequate ventilation will improve Outcome: Progressing   Problem: Role Relationship: Goal: Method of communication will improve Outcome: Progressing

## 2023-07-19 NOTE — Progress Notes (Signed)
 SLP Cancellation Note  Patient Details Name: Joyce Rodriguez MRN: [id number] DOB: 1946-03-27   Cancelled treatment:       Reason Eval/Treat Not Completed: Medical issues which prohibited therapy. Per chart and touched base with RN, pt planned for OR today. Will hold swallow eval as she is NPO for that.     Mahala Menghini., M.A. CCC-SLP [facility] Office [phone]  Secure chat preferred  07/19/2023, 8:44 AM

## 2023-07-19 NOTE — Progress Notes (Signed)
 eLink Physician-Brief Progress Note Patient Name: Joyce Rodriguez DOB: 05-03-1945 MRN: [id number]   Date of Service  07/19/2023  HPI/Events of Note  78 year old woman who suffered a fall with L hip fracture. She underwent left hip arthroplasty, but remains intubated for acute hypoxic and hypercarbic respiratory failure.   Initially had some hypercapnia and hypoxemia.  Hemodynamic stable.  eICU Interventions  Repeat ABG in the morning  KUB and chest radiograph reviewed.  OG tube okay to use okay to use     Intervention Category Intermediate Interventions: Respiratory distress - evaluation and management  Marko Skalski 07/19/2023, 8:55 PM

## 2023-07-19 NOTE — Consult Note (Signed)
 Reason for Consult:Left hip fx Referring Physician: Lynden Oxford Time called: 0730 Time at bedside: 0855   Joyce Rodriguez is an 78 y.o. female.  HPI: Caylah got up with a cane and fell at home. She had immediate left hip pain and could not get up. She was brought to the ED where x-rays showed a left hip fx and pubic ramus fx and orthopedic surgery was consulted. She lives at home with her husband and usually ambulates with a RW.  Past Medical History:  Diagnosis Date   Aortic atherosclerosis (HCC)    Arthritis    Complication of anesthesia    difficulty waking up   COPD (chronic obstructive pulmonary disease) (HCC)    Coronary artery disease    Dyspnea    Emphysema of lung (HCC)    SEVERE   GERD (gastroesophageal reflux disease)    Headache    H/O   History of hiatal hernia    Hyperlipidemia    Hypertension    H/O OFF MEDS SINCE 2020 BY PCP   Osteoporosis    Oxygen deficiency    uses O2 at night   Oxygen dependent    Pneumonia    Umbilical hernia     Past Surgical History:  Procedure Laterality Date   ABDOMINAL HYSTERECTOMY     CHOLECYSTECTOMY     ESOPHAGEAL MANOMETRY N/A 09/13/2017   Procedure: ESOPHAGEAL MANOMETRY (EM);  Surgeon: Napoleon Form, MD;  Location: [facility];  Service: Endoscopy;  Laterality: N/A;   ESOPHAGOGASTRODUODENOSCOPY (EGD) WITH PROPOFOL N/A 05/11/2017   Procedure: ESOPHAGOGASTRODUODENOSCOPY (EGD) WITH PROPOFOL;  Surgeon: Toney Reil, MD;  Location: [facility];  Service: Gastroenterology;  Laterality: N/A;   EYE SURGERY Bilateral    CATARACTS   FOOT SURGERY     MORTONS NEUROMA   LAPAROSCOPIC NISSEN FUNDOPLICATION N/A 10/19/2017   Procedure: LAPAROSCOPIC NISSEN FUNDOPLICATION;  Surgeon: Leafy Ro, MD;  Location: [facility] ORS;  Service: General;  Laterality: N/A;   NISSEN FUNDOPLICATION N/A 10/19/2017   Procedure: NISSEN FUNDOPLICATION;  Surgeon: Leafy Ro, MD;  Location: [facility] ORS;  Service: General;  Laterality: N/A;    REVERSE SHOULDER ARTHROPLASTY Left 09/10/2020   Procedure: REVERSE SHOULDER ARTHROPLASTY;  Surgeon: Christena Flake, MD;  Location: [facility] ORS;  Service: Orthopedics;  Laterality: Left;    Family History  Problem Relation Age of Onset   Ovarian cancer Mother    Emphysema Father    Melanoma Sister    Lung cancer Brother     Social History:  reports that she quit smoking about 22 years ago. Her smoking use included cigarettes. She started smoking about 52 years ago. She has a 45 pack-year smoking history. She has never used smokeless tobacco. She reports current alcohol use. She reports that she does not use drugs.  Allergies:  Allergies  Allergen Reactions   Atorvastatin Other (See Comments)    Myalgias made legs and back hurt    Atrovent Nasal Spray [Ipratropium]     Difficulty breathing   Codeine Nausea And Vomiting    Pt states that she can tolerate Tramadol fine.   Lipitor [Atorvastatin Calcium] Other (See Comments)    Myalgias    Medications: I have reviewed the patient's current medications.  Results for orders placed or performed during the hospital encounter of 07/17/23 (from the past 48 hours)  Comprehensive metabolic panel     Status: Abnormal   Collection Time: 07/18/23 12:28 AM  Result Value Ref Range   Sodium 136 135 - 145  mmol/L   Potassium 4.3 3.5 - 5.1 mmol/L   Chloride 98 98 - 111 mmol/L   CO2 31 22 - 32 mmol/L   Glucose, Bld 217 (H) 70 - 99 mg/dL    Comment: Glucose reference range applies only to samples taken after fasting for at least 8 hours.   BUN 17 8 - 23 mg/dL   Creatinine, Ser 9.14 0.44 - 1.00 mg/dL   Calcium 8.0 (L) 8.9 - 10.3 mg/dL   Total Protein 4.5 (L) 6.5 - 8.1 g/dL   Albumin 2.2 (L) 3.5 - 5.0 g/dL   AST 19 15 - 41 U/L   ALT 23 0 - 44 U/L   Alkaline Phosphatase 35 (L) 38 - 126 U/L   Total Bilirubin 0.5 0.0 - 1.2 mg/dL   GFR, Estimated >78 >29 mL/min    Comment: (NOTE) Calculated using the CKD-EPI Creatinine Equation (2021)    Anion gap  7 5 - 15    Comment: Performed at [facility], [address] [place], [place] [address]  Protime-INR     Status: None   Collection Time: 07/18/23 12:28 AM  Result Value Ref Range   Prothrombin Time 14.7 11.4 - 15.2 seconds   INR 1.1 0.8 - 1.2    Comment: (NOTE) INR goal varies based on device and disease states. Performed at [facility], [address], [place], [place] [address]   Type and screen [facility]     Status: None (Preliminary result)   Collection Time: 07/18/23 12:28 AM  Result Value Ref Range   ABO/RH(D) O POS    Antibody Screen NEG    Sample Expiration      07/22/2023,2359 Performed at [facility], [address] [place], [place] [address]    Unit Number [id number]    Blood Component Type RED CELLS,LR    Unit division 00    Status of Unit ISSUED    Transfusion Status OK TO TRANSFUSE    Crossmatch Result Compatible   CBC with Differential/Platelet     Status: Abnormal   Collection Time: 07/18/23 12:28 AM  Result Value Ref Range   WBC 28.5 (H) 4.0 - 10.5 K/uL   RBC 2.90 (L) 3.87 - 5.11 MIL/uL   Hemoglobin 9.0 (L) 12.0 - 15.0 g/dL   HCT 02.7 (L) 25.3 - 66.4 %   MCV 100.7 (H) 80.0 - 100.0 fL   MCH 31.0 26.0 - 34.0 pg   MCHC 30.8 30.0 - 36.0 g/dL   RDW 40.3 47.4 - 25.9 %   Platelets 200 150 - 400 K/uL   nRBC 0.0 0.0 - 0.2 %   Neutrophils Relative % 95 %   Neutro Abs 27.1 (H) 1.7 - 7.7 K/uL   Lymphocytes Relative 1 %   Lymphs Abs 0.2 (L) 0.7 - 4.0 K/uL   Monocytes Relative 3 %   Monocytes Absolute 1.0 0.1 - 1.0 K/uL   Eosinophils Relative 0 %   Eosinophils Absolute 0.0 0.0 - 0.5 K/uL   Basophils Relative 0 %   Basophils Absolute 0.1 0.0 - 0.1 K/uL   WBC Morphology MORPHOLOGY UNREMARKABLE    RBC Morphology MORPHOLOGY UNREMARKABLE    Smear Review MORPHOLOGY UNREMARKABLE    Immature Granulocytes 1 %   Abs Immature Granulocytes 0.20 (H) 0.00 - 0.07 K/uL    Comment: Performed at [facility], [address] [place], [place] [address]  Procalcitonin     Status: None  Collection Time: 07/18/23 12:28 AM  Result Value Ref Range   Procalcitonin 1.72 ng/mL    Comment:        Interpretation: PCT > 0.5 ng/mL and <= 2 ng/mL: Systemic infection (sepsis) is possible, but other conditions are known to elevate PCT as well. (NOTE)       Sepsis PCT Algorithm           Lower Respiratory Tract                                      Infection PCT Algorithm    ----------------------------     ----------------------------         PCT < 0.25 ng/mL                PCT < 0.10 ng/mL          Strongly encourage             Strongly discourage   discontinuation of antibiotics    initiation of antibiotics    ----------------------------     -----------------------------       PCT 0.25 - 0.50 ng/mL            PCT 0.10 - 0.25 ng/mL               OR       >80% decrease in PCT            Discourage initiation of                                            antibiotics      Encourage discontinuation           of antibiotics    ----------------------------     -----------------------------         PCT >= 0.50 ng/mL              PCT 0.26 - 0.50 ng/mL                AND       <80% decrease in PCT             Encourage initiation of                                             antibiotics       Encourage continuation           of antibiotics    ----------------------------     -----------------------------        PCT >= 0.50 ng/mL                  PCT > 0.50 ng/mL               AND         increase in PCT                  Strongly encourage                                      initiation of antibiotics  Strongly encourage escalation           of antibiotics                                     -----------------------------                                           PCT <= 0.25 ng/mL                                                 OR                                        > 80% decrease in PCT                                       Discontinue / Do not initiate                                             antibiotics  Performed at [facility], [address], [place], [place] [address]   I-Stat venous blood gas, ED     Status: Abnormal   Collection Time: 07/18/23 12:35 AM  Result Value Ref Range   pH, Ven 7.251 7.25 - 7.43   pCO2, Ven 79.1 (HH) 44 - 60 mmHg   pO2, Ven 32 32 - 45 mmHg   Bicarbonate 34.8 (H) 20.0 - 28.0 mmol/L   TCO2 37 (H) 22 - 32 mmol/L   O2 Saturation 50 %   Acid-Base Excess 6.0 (H) 0.0 - 2.0 mmol/L   Sodium 138 135 - 145 mmol/L   Potassium 4.4 3.5 - 5.1 mmol/L   Calcium, Ion 1.18 1.15 - 1.40 mmol/L   HCT 28.0 (L) 36.0 - 46.0 %   Hemoglobin 9.5 (L) 12.0 - 15.0 g/dL   Sample type VENOUS    Comment NOTIFIED PHYSICIAN   Prepare RBC     Status: None   Collection Time: 07/18/23  1:12 AM  Result Value Ref Range   Order Confirmation      ORDER PROCESSED BY BLOOD BANK Performed at [facility], [address], [place], [place] [address]   CBC     Status: Abnormal   Collection Time: 07/18/23  3:42 AM  Result Value Ref Range   WBC 29.3 (H) 4.0 - 10.5 K/uL   RBC 3.84 (L) 3.87 - 5.11 MIL/uL   Hemoglobin 11.6 (L) 12.0 - 15.0 g/dL    Comment: POST TRANSFUSION SPECIMEN   HCT 37.2 36.0 - 46.0 %   MCV 96.9 80.0 - 100.0 fL   MCH 30.2 26.0 - 34.0 pg   MCHC 31.2 30.0 - 36.0 g/dL   RDW 09.8 11.9 - 14.7 %   Platelets 206 150 - 400 K/uL   nRBC 0.0 0.0 - 0.2 %    Comment: Performed  at [facility], [address] [place], [place] [address]  Hemoglobin A1c     Status: Abnormal   Collection Time: 07/18/23  3:42 AM  Result Value Ref Range   Hgb A1c MFr Bld 5.7 (H) 4.8 - 5.6 %    Comment: (NOTE) Pre diabetes:          5.7%-6.4%  Diabetes:              >6.4%  Glycemic control for   <7.0% adults with diabetes    Mean Plasma Glucose 116.89 mg/dL    Comment: Performed at [facility], [address], [place], [place] [address]  I-Stat venous blood gas, ED      Status: Abnormal   Collection Time: 07/18/23  3:49 AM  Result Value Ref Range   pH, Ven 7.295 7.25 - 7.43   pCO2, Ven 73.8 (HH) 44 - 60 mmHg   pO2, Ven 31 (LL) 32 - 45 mmHg   Bicarbonate 35.9 (H) 20.0 - 28.0 mmol/L   TCO2 38 (H) 22 - 32 mmol/L   O2 Saturation 49 %   Acid-Base Excess 7.0 (H) 0.0 - 2.0 mmol/L   Sodium 138 135 - 145 mmol/L   Potassium 4.7 3.5 - 5.1 mmol/L   Calcium, Ion 1.15 1.15 - 1.40 mmol/L   HCT 36.0 36.0 - 46.0 %   Hemoglobin 12.2 12.0 - 15.0 g/dL   Sample type VENOUS    Comment NOTIFIED PHYSICIAN   Lactic acid, plasma     Status: None   Collection Time: 07/18/23  5:29 AM  Result Value Ref Range   Lactic Acid, Venous 1.9 0.5 - 1.9 mmol/L    Comment: Performed at [facility], [address] [place], [place] [address]  CBG monitoring, ED     Status: Abnormal   Collection Time: 07/18/23  6:05 AM  Result Value Ref Range   Glucose-Capillary 185 (H) 70 - 99 mg/dL    Comment: Glucose reference range applies only to samples taken after fasting for at least 8 hours.  Glucose, capillary     Status: Abnormal   Collection Time: 07/18/23 10:18 AM  Result Value Ref Range   Glucose-Capillary 161 (H) 70 - 99 mg/dL    Comment: Glucose reference range applies only to samples taken after fasting for at least 8 hours.  Blood gas, arterial     Status: Abnormal   Collection Time: 07/18/23 10:45 AM  Result Value Ref Range   pH, Arterial 7.35 7.35 - 7.45   pCO2 arterial 58 (H) 32 - 48 mmHg   pO2, Arterial 79 (L) 83 - 108 mmHg   Bicarbonate 32.6 (H) 20.0 - 28.0 mmol/L   Acid-Base Excess 5.2 (H) 0.0 - 2.0 mmol/L   O2 Saturation 96.8 %   Patient temperature 36.8    Collection site RIGHT RADIAL    Drawn by MA, RT    Allens test (pass/fail) PASS PASS    Comment: Performed at [facility], [address], [place], [place] [address]  Glucose, capillary     Status: Abnormal   Collection Time: 07/18/23 11:26 AM  Result Value Ref Range   Glucose-Capillary 154 (H) 70 -  99 mg/dL    Comment: Glucose reference range applies only to samples taken after fasting for at least 8 hours.  CBC     Status: Abnormal   Collection Time: 07/18/23  1:24 PM  Result Value Ref Range   WBC 24.9 (H)  4.0 - 10.5 K/uL   RBC 3.59 (L) 3.87 - 5.11 MIL/uL   Hemoglobin 10.7 (L) 12.0 - 15.0 g/dL   HCT 40.9 (L) 81.1 - 91.4 %   MCV 94.4 80.0 - 100.0 fL   MCH 29.8 26.0 - 34.0 pg   MCHC 31.6 30.0 - 36.0 g/dL   RDW 78.2 95.6 - 21.3 %   Platelets 182 150 - 400 K/uL   nRBC 0.0 0.0 - 0.2 %    Comment: Performed at [facility], [address], [place], [place] [address]  Glucose, capillary     Status: Abnormal   Collection Time: 07/18/23  4:27 PM  Result Value Ref Range   Glucose-Capillary 150 (H) 70 - 99 mg/dL    Comment: Glucose reference range applies only to samples taken after fasting for at least 8 hours.  CBC     Status: Abnormal   Collection Time: 07/18/23  5:46 PM  Result Value Ref Range   WBC 24.3 (H) 4.0 - 10.5 K/uL   RBC 3.48 (L) 3.87 - 5.11 MIL/uL   Hemoglobin 10.5 (L) 12.0 - 15.0 g/dL   HCT 84.6 (L) 96.2 - 95.2 %   MCV 95.7 80.0 - 100.0 fL   MCH 30.2 26.0 - 34.0 pg   MCHC 31.5 30.0 - 36.0 g/dL   RDW 84.1 (H) 32.4 - 40.1 %   Platelets 169 150 - 400 K/uL   nRBC 0.2 0.0 - 0.2 %    Comment: Performed at [facility], [address], [place], [place] [address]  TSH     Status: None   Collection Time: 07/18/23  5:46 PM  Result Value Ref Range   TSH 0.374 0.350 - 4.500 uIU/mL    Comment: Performed by a 3rd Generation assay with a functional sensitivity of <=0.01 uIU/mL. Performed at [facility], [address], [place], [place] [address]   T4, free     Status: None   Collection Time: 07/18/23  5:46 PM  Result Value Ref Range   Free T4 0.91 0.61 - 1.12 ng/dL    Comment: (NOTE) Biotin ingestion may interfere with free T4 tests. If the results are inconsistent with the TSH level, previous test results, or the clinical presentation, then consider  biotin interference. If needed, order repeat testing after stopping biotin. Performed at [facility], [address] [place], [place] [address]   Ammonia     Status: None   Collection Time: 07/18/23  5:46 PM  Result Value Ref Range   Ammonia 29 9 - 35 umol/L    Comment: Performed at [facility], [address], [place], [place] [address]  Vitamin B12     Status: Abnormal   Collection Time: 07/18/23  5:46 PM  Result Value Ref Range   Vitamin B-12 162 (L) 180 - 914 pg/mL    Comment: (NOTE) This assay is not validated for testing neonatal or myeloproliferative syndrome specimens for Vitamin B12 levels. Performed at [facility], [address] [place], [place] [address]   Folate     Status: None   Collection Time: 07/18/23  5:46 PM  Result Value Ref Range   Folate 13.8 >5.9 ng/mL    Comment: Performed at [facility], [address], [place], [place] [address]  Glucose, capillary     Status: Abnormal   Collection Time: 07/18/23  8:24 PM  Result Value Ref Range   Glucose-Capillary 138 (H)  70 - 99 mg/dL    Comment: Glucose reference range applies only to samples taken after fasting for at least 8 hours.  Glucose, capillary     Status: Abnormal   Collection Time: 07/19/23  1:14 AM  Result Value Ref Range   Glucose-Capillary 153 (H) 70 - 99 mg/dL    Comment: Glucose reference range applies only to samples taken after fasting for at least 8 hours.  CBC     Status: Abnormal   Collection Time: 07/19/23  1:26 AM  Result Value Ref Range   WBC 24.9 (H) 4.0 - 10.5 K/uL   RBC 3.21 (L) 3.87 - 5.11 MIL/uL   Hemoglobin 9.7 (L) 12.0 - 15.0 g/dL   HCT 16.1 (L) 09.6 - 04.5 %   MCV 96.9 80.0 - 100.0 fL   MCH 30.2 26.0 - 34.0 pg   MCHC 31.2 30.0 - 36.0 g/dL   RDW 40.9 81.1 - 91.4 %   Platelets 170 150 - 400 K/uL   nRBC 0.0 0.0 - 0.2 %    Comment: Performed at [facility], [address] [place], [place] [address]  Glucose, capillary     Status: Abnormal    Collection Time: 07/19/23  4:21 AM  Result Value Ref Range   Glucose-Capillary 126 (H) 70 - 99 mg/dL    Comment: Glucose reference range applies only to samples taken after fasting for at least 8 hours.  Glucose, capillary     Status: Abnormal   Collection Time: 07/19/23  7:53 AM  Result Value Ref Range   Glucose-Capillary 115 (H) 70 - 99 mg/dL    Comment: Glucose reference range applies only to samples taken after fasting for at least 8 hours.   Comment 1 Notify RN    Comment 2 Document in Chart     MR BRAIN WO CONTRAST Result Date: 07/19/2023 CLINICAL DATA:  Altered mental status EXAM: MRI HEAD WITHOUT CONTRAST TECHNIQUE: Multiplanar, multiecho pulse sequences of the brain and surrounding structures were obtained without intravenous contrast. COMPARISON:  None Available. FINDINGS: Brain: There are punctate foci of abnormal diffusion restriction within the right frontal white matter and left thalamus. No acute or chronic hemorrhage. There is multifocal hyperintense T2-weighted signal within the white matter. Parenchymal volume and CSF spaces are normal. The midline structures are normal. Old left cerebellar infarct. Vascular: Normal flow voids. Skull and upper cervical spine: Normal calvarium and skull base. Visualized upper cervical spine and soft tissues are normal. Sinuses/Orbits:No paranasal sinus fluid levels or advanced mucosal thickening. No mastoid or middle ear effusion. Normal orbits. IMPRESSION: 1. Punctate acute infarcts within the right frontal white matter and left thalamus. No hemorrhage or mass effect. 2. 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 Electronically Signed   By: Deatra Robinson M.D.   On: 07/19/2023 02:23   EEG adult Result Date: 07/18/2023 Charlsie Quest, MD     07/18/2023  3:58 PM Patient Name: Tala Eber MRN: [id number] Epilepsy Attending: Charlsie Quest Referring Physician/Provider: Rolly Salter, MD Date: 07/18/2023 Duration:  23.11 mins Patient history: 78yo F with ams. EEG to evaluate for seizure Level of alertness: Awake/ lethargic AEDs during EEG study: None Technical aspects: This EEG study was done with scalp electrodes positioned according to the 10-20 International system of electrode placement. Electrical activity was reviewed with band pass filter of 1-70Hz , sensitivity of 7 uV/mm, display speed of 70mm/sec with a 60Hz  notched filter applied as appropriate. EEG data were recorded  continuously and digitally stored.  Video monitoring was available and reviewed as appropriate. Description: EEG showed continuous generalized 3 to 6 Hz theta-delta slowing. Hyperventilation and photic stimulation were not performed.   ABNORMALITY - Continuous slow, generalized IMPRESSION: This study is suggestive of moderate diffuse encephalopathy. No seizures or epileptiform discharges were seen throughout the recording. Charlsie Quest   CT Angio Chest PE W and/or Wo Contrast Result Date: 07/18/2023 CLINICAL DATA:  Pulmonary embolism (PE), high prob; Abdominal trauma, blunt EXAM: CT ANGIOGRAPHY CHEST CT ABDOMEN AND PELVIS WITH CONTRAST TECHNIQUE: Multidetector CT imaging of the chest was performed using the standard protocol during bolus administration of intravenous contrast. Multiplanar CT image reconstructions and MIPs were obtained to evaluate the vascular anatomy. Multidetector CT imaging of the abdomen and pelvis was performed using the standard protocol during bolus administration of intravenous contrast. RADIATION DOSE REDUCTION: This exam was performed according to the departmental dose-optimization program which includes automated exposure control, adjustment of the mA and/or kV according to patient size and/or use of iterative reconstruction technique. CONTRAST:  75mL OMNIPAQUE IOHEXOL 350 MG/ML SOLN COMPARISON:  CT chest 12/11/2020, CT abdomen pelvis 05/19/2019 FINDINGS: CTA CHEST FINDINGS Cardiovascular: Adequate opacification of the  pulmonary arterial tree. No intraluminal filling defect identified to suggest acute pulmonary embolism. Central pulmonary arteries are of normal caliber. Moderate coronary artery calcification. Cardiac size within normal limits though enlargement of the right ventricle is noted suggesting elevated right heart pressures and there is reflux of contrast into the hepatic venous system in keeping with a least some degree of right heart failure. No pericardial effusion. Moderate atherosclerotic calcification within the thoracic aorta. No aortic aneurysm. Mediastinum/Nodes: No enlarged mediastinal, hilar, or axillary lymph nodes. Thyroid gland, trachea, and esophagus demonstrate no significant findings. Lungs/Pleura: Severe emphysema. Interval development of widespread superimposed ground-glass pulmonary infiltrate, infection versus edema. Stable scarring within the right middle lobe. Trace right and small left pleural effusions are present. No pneumothorax. Musculoskeletal: No chest wall abnormality. No acute or significant osseous findings. Review of the MIP images confirms the above findings. CT ABDOMEN and PELVIS FINDINGS Hepatobiliary: No focal liver abnormality is seen. Status post cholecystectomy. Mild intrahepatic biliary ductal dilation in keeping with post cholecystectomy change. No extra hepatic biliary dilatation. Pancreas: Unremarkable Spleen: Unremarkable Adrenals/Urinary Tract: The adrenal glands are unremarkable. The kidneys are normal in size and position. Simple cortical cyst is seen within the upper pole the right kidney for which no follow-up imaging is recommended. The bladder is mildly distended. There is extensive high attenuation fluid within the space of Retzius compatible with acute extraperitoneal hemorrhage related to right pubic symphyseal fracture with several small foci of active extravasation identified immediately superior to the fracture best seen on coronal image # 55/8. Stomach/Bowel:  Moderate sigmoid diverticulosis. Stomach, small bowel, and large bowel are otherwise unremarkable. Appendix absent. No evidence of obstruction or focal inflammation. No free intraperitoneal gas or fluid. Vascular/Lymphatic: Aortic atherosclerosis. No enlarged abdominal or pelvic lymph nodes. Reproductive: Status post hysterectomy. No adnexal masses. Other: Tiny fat containing umbilical hernia. No free intraperitoneal fluid. Musculoskeletal: Acute fracture of the right superior pubic ramus extending into the pubic symphysis. Acute subcapital left femoral neck fracture with moderate external rotation, override, and marked varus angulation of the distal fracture fragment. Femoral head is still seated within the left acetabulum. Mild bilateral degenerative hip arthritis. There is mixed lytic and sclerotic process within the left sacral ala, new since prior examination suspicious for a insufficiency fracture with mild cortical irregularity noted.  Review of the MIP images confirms the above findings. IMPRESSION: 1. No pulmonary embolism. 2. Moderate coronary artery calcification. Enlargement of the right ventricle suggesting elevated right heart pressures and there is reflux of contrast into the hepatic venous system in keeping with a least some degree of right heart failure. 3. Severe emphysema. Interval development of widespread superimposed ground-glass pulmonary infiltrate, infection versus edema. Trace right and small left pleural effusions. 4. Acute fracture of the right superior pubic ramus extending into the pubic symphysis with extensive associated extraperitoneal hemorrhage and several small foci of active extravasation immediately superior to the fracture. 5. Acute subcapital left femoral neck fracture with moderate external rotation, override, and marked varus angulation of the distal fracture fragment. 6. Mixed lytic and sclerotic process within the left sacral ala, new since prior examination suspicious for  a insufficiency fracture with mild cortical irregularity noted. This could be confirmed with MRI examination 7. Moderate sigmoid diverticulosis. Aortic Atherosclerosis (ICD10-I70.0) and Emphysema (ICD10-J43.9). Electronically Signed   By: Helyn Numbers M.D.   On: 07/18/2023 00:59   CT ABDOMEN PELVIS W CONTRAST Result Date: 07/18/2023 CLINICAL DATA:  Pulmonary embolism (PE), high prob; Abdominal trauma, blunt EXAM: CT ANGIOGRAPHY CHEST CT ABDOMEN AND PELVIS WITH CONTRAST TECHNIQUE: Multidetector CT imaging of the chest was performed using the standard protocol during bolus administration of intravenous contrast. Multiplanar CT image reconstructions and MIPs were obtained to evaluate the vascular anatomy. Multidetector CT imaging of the abdomen and pelvis was performed using the standard protocol during bolus administration of intravenous contrast. RADIATION DOSE REDUCTION: This exam was performed according to the departmental dose-optimization program which includes automated exposure control, adjustment of the mA and/or kV according to patient size and/or use of iterative reconstruction technique. CONTRAST:  75mL OMNIPAQUE IOHEXOL 350 MG/ML SOLN COMPARISON:  CT chest 12/11/2020, CT abdomen pelvis 05/19/2019 FINDINGS: CTA CHEST FINDINGS Cardiovascular: Adequate opacification of the pulmonary arterial tree. No intraluminal filling defect identified to suggest acute pulmonary embolism. Central pulmonary arteries are of normal caliber. Moderate coronary artery calcification. Cardiac size within normal limits though enlargement of the right ventricle is noted suggesting elevated right heart pressures and there is reflux of contrast into the hepatic venous system in keeping with a least some degree of right heart failure. No pericardial effusion. Moderate atherosclerotic calcification within the thoracic aorta. No aortic aneurysm. Mediastinum/Nodes: No enlarged mediastinal, hilar, or axillary lymph nodes. Thyroid  gland, trachea, and esophagus demonstrate no significant findings. Lungs/Pleura: Severe emphysema. Interval development of widespread superimposed ground-glass pulmonary infiltrate, infection versus edema. Stable scarring within the right middle lobe. Trace right and small left pleural effusions are present. No pneumothorax. Musculoskeletal: No chest wall abnormality. No acute or significant osseous findings. Review of the MIP images confirms the above findings. CT ABDOMEN and PELVIS FINDINGS Hepatobiliary: No focal liver abnormality is seen. Status post cholecystectomy. Mild intrahepatic biliary ductal dilation in keeping with post cholecystectomy change. No extra hepatic biliary dilatation. Pancreas: Unremarkable Spleen: Unremarkable Adrenals/Urinary Tract: The adrenal glands are unremarkable. The kidneys are normal in size and position. Simple cortical cyst is seen within the upper pole the right kidney for which no follow-up imaging is recommended. The bladder is mildly distended. There is extensive high attenuation fluid within the space of Retzius compatible with acute extraperitoneal hemorrhage related to right pubic symphyseal fracture with several small foci of active extravasation identified immediately superior to the fracture best seen on coronal image # 55/8. Stomach/Bowel: Moderate sigmoid diverticulosis. Stomach, small bowel, and large bowel are otherwise  unremarkable. Appendix absent. No evidence of obstruction or focal inflammation. No free intraperitoneal gas or fluid. Vascular/Lymphatic: Aortic atherosclerosis. No enlarged abdominal or pelvic lymph nodes. Reproductive: Status post hysterectomy. No adnexal masses. Other: Tiny fat containing umbilical hernia. No free intraperitoneal fluid. Musculoskeletal: Acute fracture of the right superior pubic ramus extending into the pubic symphysis. Acute subcapital left femoral neck fracture with moderate external rotation, override, and marked varus  angulation of the distal fracture fragment. Femoral head is still seated within the left acetabulum. Mild bilateral degenerative hip arthritis. There is mixed lytic and sclerotic process within the left sacral ala, new since prior examination suspicious for a insufficiency fracture with mild cortical irregularity noted. Review of the MIP images confirms the above findings. IMPRESSION: 1. No pulmonary embolism. 2. Moderate coronary artery calcification. Enlargement of the right ventricle suggesting elevated right heart pressures and there is reflux of contrast into the hepatic venous system in keeping with a least some degree of right heart failure. 3. Severe emphysema. Interval development of widespread superimposed ground-glass pulmonary infiltrate, infection versus edema. Trace right and small left pleural effusions. 4. Acute fracture of the right superior pubic ramus extending into the pubic symphysis with extensive associated extraperitoneal hemorrhage and several small foci of active extravasation immediately superior to the fracture. 5. Acute subcapital left femoral neck fracture with moderate external rotation, override, and marked varus angulation of the distal fracture fragment. 6. Mixed lytic and sclerotic process within the left sacral ala, new since prior examination suspicious for a insufficiency fracture with mild cortical irregularity noted. This could be confirmed with MRI examination 7. Moderate sigmoid diverticulosis. Aortic Atherosclerosis (ICD10-I70.0) and Emphysema (ICD10-J43.9). Electronically Signed   By: Helyn Numbers M.D.   On: 07/18/2023 00:59   CT PELVIS WO CONTRAST Result Date: 07/17/2023 CLINICAL DATA:  Ground level fall. EXAM: CT PELVIS WITHOUT CONTRAST TECHNIQUE: Multidetector CT imaging of the pelvis was performed following the standard protocol without intravenous contrast. RADIATION DOSE REDUCTION: This exam was performed according to the departmental dose-optimization program  which includes automated exposure control, adjustment of the mA and/or kV according to patient size and/or use of iterative reconstruction technique. COMPARISON:  May 19, 2019 FINDINGS: Urinary Tract: A 3.5 cm x 11.6 cm x 9.3 cm hematoma is seen adjacent to the anterior aspect of an intact, moderate to markedly distended urinary bladder. This extends inferiorly along the region adjacent to the urinary bladder on the right, to the posterior aspect of acute comminuted fracture deformity involving the symphysis pubis on the right. Bowel: There is no evidence of bowel dilatation. Noninflamed diverticula are seen throughout the sigmoid colon. Vascular/Lymphatic: There is marked severity calcification of the visualized portion of the infrarenal abdominal aorta and bilateral common iliac arteries. No abnormal abdominal or pelvic lymph nodes are identified. Reproductive: The uterus is surgically absent. The bilateral adnexa are unremarkable. Other: A 1.5 cm diameter fat containing umbilical hernia is seen. No abdominopelvic ascites. Musculoskeletal: There is an acute, comminuted, mildly displaced fracture deformity involving the medial aspect of the right superior pubic ramus. This extends to the level of the symphysis pubis. Additional acute, nondisplaced fractures are seen involving the left superior pubic ramus, bilateral inferior pubic rami, and the head and neck of the proximal left femur. There is no evidence of dislocation. IMPRESSION: 1. Acute, comminuted, mildly displaced fracture deformity involving the medial aspect of the right superior pubic ramus and symphysis pubis on the right. 2. Additional acute, nondisplaced fractures involving the left superior pubic ramus, bilateral  inferior pubic rami, and the head and neck of the proximal left femur. 3. 3.5 cm x 11.6 cm x 9.3 cm hematoma adjacent to the anterior aspect of the urinary bladder, as described above. 4. Sigmoid diverticulosis. 5. Aortic  atherosclerosis. Aortic Atherosclerosis (ICD10-I70.0). Electronically Signed   By: Aram Candela M.D.   On: 07/17/2023 19:49   DG Hip Unilat W or Wo Pelvis 2-3 Views Left Result Date: 07/17/2023 CLINICAL DATA:  Ground level fall hip injury EXAM: DG HIP (WITH OR WITHOUT PELVIS) 2-3V LEFT COMPARISON:  None Available. FINDINGS: SI joints are non widened. Pubic symphysis appears intact. Acute displaced left femoral neck fracture. No femoral head dislocation. Acute appearing mildly displaced right superior pubic ramus fracture. Linear lucency at the left inferior pubic ramus, cannot exclude nondisplaced fracture. IMPRESSION: 1. Acute displaced left femoral neck fracture. 2. Acute mildly displaced right superior pubic ramus fracture. 3. Linear lucency at the left inferior pubic ramus, cannot exclude nondisplaced fracture. Electronically Signed   By: Jasmine Pang M.D.   On: 07/17/2023 18:21   CT Cervical Spine Wo Contrast Result Date: 07/17/2023 CLINICAL DATA:  Ground level fall EXAM: CT CERVICAL SPINE WITHOUT CONTRAST TECHNIQUE: Multidetector CT imaging of the cervical spine was performed without intravenous contrast. Multiplanar CT image reconstructions were also generated. RADIATION DOSE REDUCTION: This exam was performed according to the departmental dose-optimization program which includes automated exposure control, adjustment of the mA and/or kV according to patient size and/or use of iterative reconstruction technique. COMPARISON:  Radiograph report 12/31/2017 FINDINGS: Alignment: Significant motion degradation. Straightening of the cervical spine. Facet alignment grossly maintained Skull base and vertebrae: Limited by motion. Normal vertebral body heights. No gross fracture Soft tissues and spinal canal: No prevertebral fluid or swelling. No visible canal hematoma. Disc levels: Moderate severe diffuse disc space narrowing C3 through T1. Partial ankylosis C4-C5. Hypertrophic facet degenerative changes at  multiple levels with foraminal narrowing Upper chest: Apical emphysema Other: None IMPRESSION: 1. Significant motion degradation. No gross acute osseous abnormality. 2. Multilevel degenerative changes. 3. Emphysema Electronically Signed   By: Jasmine Pang M.D.   On: 07/17/2023 18:19   CT Head Wo Contrast Result Date: 07/17/2023 CLINICAL DATA:  Ground level fall EXAM: CT HEAD WITHOUT CONTRAST TECHNIQUE: Contiguous axial images were obtained from the base of the skull through the vertex without intravenous contrast. RADIATION DOSE REDUCTION: This exam was performed according to the departmental dose-optimization program which includes automated exposure control, adjustment of the mA and/or kV according to patient size and/or use of iterative reconstruction technique. COMPARISON:  None Available. FINDINGS: Brain: No acute territorial infarction, hemorrhage or intracranial mass. Small chronic appearing left cerebellar infarcts. Atrophy and patchy white matter hypodensity likely chronic small vessel ischemic change. Nonenlarged ventricles Vascular: No hyperdense vessel or unexpected calcification. Skull: Normal. Negative for fracture or focal lesion. Sinuses/Orbits: No acute finding. Other: None IMPRESSION: 1. No CT evidence for acute intracranial abnormality. 2. Atrophy and chronic small vessel ischemic changes of the white matter. Small chronic appearing left cerebellar infarcts. Electronically Signed   By: Jasmine Pang M.D.   On: 07/17/2023 18:10   DG Chest Port 1 View Result Date: 07/17/2023 CLINICAL DATA:  Questionable sepsis - evaluate for abnormality Fall. EXAM: PORTABLE CHEST 1 VIEW COMPARISON:  Radiograph 04/21/2018 FINDINGS: The lungs are hyperinflated with at least moderate emphysema. Peribronchial thickening most prominent in the mid lower lung zones. Subsegmental atelectasis at the left lung base. Stable heart size and mediastinal contours. Aortic atherosclerosis. Calcified granuloma in the  right lung.  No pulmonary edema, pneumothorax, or large pleural effusion. Left shoulder arthroplasty. IMPRESSION: 1. Hyperinflation with at least moderate emphysema. Peribronchial thickening most prominent in the mid lower lung zones, may represent COPD exacerbation. 2. Subsegmental atelectasis at the left lung base. Electronically Signed   By: Narda Rutherford M.D.   On: 07/17/2023 17:51    Review of Systems  Unable to perform ROS: Patient nonverbal   Blood pressure (!) 145/84, pulse (!) 126, temperature 98.4 F (36.9 C), temperature source Axillary, resp. rate 16, height 5\' 6"  (1.676 m), weight 68 kg, SpO2 97%. Physical Exam Constitutional:      General: She is not in acute distress.    Appearance: She is well-developed. She is not diaphoretic.     Comments: Obtunded  HENT:     Head: Normocephalic and atraumatic.  Eyes:     General: No scleral icterus.       Right eye: No discharge.        Left eye: No discharge.     Conjunctiva/sclera: Conjunctivae normal.  Cardiovascular:     Rate and Rhythm: Regular rhythm. Tachycardia present.  Pulmonary:     Effort: No respiratory distress.     Comments: On Bipap Musculoskeletal:     Cervical back: Normal range of motion.     Comments: LLE No traumatic wounds, ecchymosis, or rash  No knee or ankle effusion  Knee stable to varus/ valgus and anterior/posterior stress  Sens DPN, SPN, TN could not assess  Motor EHL, ext, flex, evers could not assess  DP 1+, PT 0, No significant edema  Skin:    General: Skin is warm and dry.  Psychiatric:     Comments: Obtunded     Assessment/Plan: Left hip fx -- Plan hip hemi vs THA today with Dr. Roda Shutters. Please keep NPO. Pubic ramus fx -- Plan non-operative treatment with WBAT BLE.    Freeman Caldron, PA-C Orthopedic Surgery [phone] 07/19/2023, 9:05 AM

## 2023-07-19 NOTE — Progress Notes (Signed)
  Echocardiogram 2D Echocardiogram has been performed.  Joyce Rodriguez 07/19/2023, 10:28 AM

## 2023-07-20 ENCOUNTER — Inpatient Hospital Stay (HOSPITAL_COMMUNITY)

## 2023-07-20 DIAGNOSIS — J9622 Acute and chronic respiratory failure with hypercapnia: Secondary | ICD-10-CM | POA: Diagnosis not present

## 2023-07-20 DIAGNOSIS — G9341 Metabolic encephalopathy: Secondary | ICD-10-CM

## 2023-07-20 DIAGNOSIS — J449 Chronic obstructive pulmonary disease, unspecified: Secondary | ICD-10-CM | POA: Diagnosis not present

## 2023-07-20 DIAGNOSIS — I69391 Dysphagia following cerebral infarction: Secondary | ICD-10-CM

## 2023-07-20 DIAGNOSIS — N9489 Other specified conditions associated with female genital organs and menstrual cycle: Secondary | ICD-10-CM | POA: Diagnosis not present

## 2023-07-20 DIAGNOSIS — J9601 Acute respiratory failure with hypoxia: Secondary | ICD-10-CM

## 2023-07-20 DIAGNOSIS — R29721 NIHSS score 21: Secondary | ICD-10-CM

## 2023-07-20 DIAGNOSIS — Z515 Encounter for palliative care: Secondary | ICD-10-CM

## 2023-07-20 DIAGNOSIS — I739 Peripheral vascular disease, unspecified: Secondary | ICD-10-CM | POA: Diagnosis not present

## 2023-07-20 DIAGNOSIS — J9602 Acute respiratory failure with hypercapnia: Secondary | ICD-10-CM

## 2023-07-20 DIAGNOSIS — I6381 Other cerebral infarction due to occlusion or stenosis of small artery: Secondary | ICD-10-CM

## 2023-07-20 DIAGNOSIS — S72012A Unspecified intracapsular fracture of left femur, initial encounter for closed fracture: Secondary | ICD-10-CM | POA: Diagnosis not present

## 2023-07-20 DIAGNOSIS — S72002A Fracture of unspecified part of neck of left femur, initial encounter for closed fracture: Secondary | ICD-10-CM | POA: Diagnosis not present

## 2023-07-20 DIAGNOSIS — J9621 Acute and chronic respiratory failure with hypoxia: Secondary | ICD-10-CM | POA: Diagnosis not present

## 2023-07-20 DIAGNOSIS — Z7189 Other specified counseling: Secondary | ICD-10-CM | POA: Diagnosis not present

## 2023-07-20 DIAGNOSIS — S7292XA Unspecified fracture of left femur, initial encounter for closed fracture: Secondary | ICD-10-CM

## 2023-07-20 DIAGNOSIS — E785 Hyperlipidemia, unspecified: Secondary | ICD-10-CM

## 2023-07-20 DIAGNOSIS — G928 Other toxic encephalopathy: Secondary | ICD-10-CM

## 2023-07-20 DIAGNOSIS — K683 Retroperitoneal hematoma: Secondary | ICD-10-CM

## 2023-07-20 DIAGNOSIS — J9690 Respiratory failure, unspecified, unspecified whether with hypoxia or hypercapnia: Secondary | ICD-10-CM

## 2023-07-20 LAB — CBC
HCT: 23.3 % — ABNORMAL LOW (ref 36.0–46.0)
HCT: 23.4 % — ABNORMAL LOW (ref 36.0–46.0)
Hemoglobin: 7.1 g/dL — ABNORMAL LOW (ref 12.0–15.0)
Hemoglobin: 7.2 g/dL — ABNORMAL LOW (ref 12.0–15.0)
MCH: 29.8 pg (ref 26.0–34.0)
MCH: 30 pg (ref 26.0–34.0)
MCHC: 30.5 g/dL (ref 30.0–36.0)
MCHC: 30.8 g/dL (ref 30.0–36.0)
MCV: 97.9 fL (ref 80.0–100.0)
MCV: 97.5 fL (ref 80.0–100.0)
Platelets: 112 10*3/uL — ABNORMAL LOW (ref 150–400)
Platelets: 128 10*3/uL — ABNORMAL LOW (ref 150–400)
RBC: 2.38 MIL/uL — ABNORMAL LOW (ref 3.87–5.11)
RBC: 2.4 MIL/uL — ABNORMAL LOW (ref 3.87–5.11)
RDW: 15.2 % (ref 11.5–15.5)
RDW: 15.1 % (ref 11.5–15.5)
WBC: 15.9 10*3/uL — ABNORMAL HIGH (ref 4.0–10.5)
WBC: 18.2 10*3/uL — ABNORMAL HIGH (ref 4.0–10.5)
nRBC: 0.1 % (ref 0.0–0.2)
nRBC: 0.2 % (ref 0.0–0.2)

## 2023-07-20 LAB — POCT I-STAT 7, (LYTES, BLD GAS, ICA,H+H)
Acid-Base Excess: 4 mmol/L — ABNORMAL HIGH (ref 0.0–2.0)
Bicarbonate: 29.3 mmol/L — ABNORMAL HIGH (ref 20.0–28.0)
Calcium, Ion: 1.3 mmol/L (ref 1.15–1.40)
HCT: 20 % — ABNORMAL LOW (ref 36.0–46.0)
Hemoglobin: 6.8 g/dL — CL (ref 12.0–15.0)
O2 Saturation: 90 %
Patient temperature: 98.6
Potassium: 4.2 mmol/L (ref 3.5–5.1)
Sodium: 142 mmol/L (ref 135–145)
TCO2: 31 mmol/L (ref 22–32)
pCO2 arterial: 48.2 mmHg — ABNORMAL HIGH (ref 32–48)
pH, Arterial: 7.392 (ref 7.35–7.45)
pO2, Arterial: 59 mmHg — ABNORMAL LOW (ref 83–108)

## 2023-07-20 LAB — GLUCOSE, CAPILLARY
Glucose-Capillary: 131 mg/dL — ABNORMAL HIGH (ref 70–99)
Glucose-Capillary: 123 mg/dL — ABNORMAL HIGH (ref 70–99)
Glucose-Capillary: 114 mg/dL — ABNORMAL HIGH (ref 70–99)
Glucose-Capillary: 153 mg/dL — ABNORMAL HIGH (ref 70–99)
Glucose-Capillary: 164 mg/dL — ABNORMAL HIGH (ref 70–99)

## 2023-07-20 LAB — COMPREHENSIVE METABOLIC PANEL WITH GFR
ALT: 20 U/L (ref 0–44)
AST: 17 U/L (ref 15–41)
Albumin: 2.9 g/dL — ABNORMAL LOW (ref 3.5–5.0)
Alkaline Phosphatase: 22 U/L — ABNORMAL LOW (ref 38–126)
Anion gap: 11 (ref 5–15)
BUN: 35 mg/dL — ABNORMAL HIGH (ref 8–23)
CO2: 27 mmol/L (ref 22–32)
Calcium: 9 mg/dL (ref 8.9–10.3)
Chloride: 106 mmol/L (ref 98–111)
Creatinine, Ser: 0.74 mg/dL (ref 0.44–1.00)
GFR, Estimated: >60 mL/min (ref >60)
Glucose, Bld: 129 mg/dL — ABNORMAL HIGH (ref 70–99)
Potassium: 4.3 mmol/L (ref 3.5–5.1)
Sodium: 144 mmol/L (ref 135–145)
Total Bilirubin: 0.6 mg/dL (ref 0.0–1.2)
Total Protein: 5 g/dL — ABNORMAL LOW (ref 6.5–8.1)

## 2023-07-20 LAB — PREPARE RBC (CROSSMATCH)

## 2023-07-20 LAB — BLOOD GAS, VENOUS
Acid-Base Excess: 6.6 mmol/L — ABNORMAL HIGH (ref 0.0–2.0)
Bicarbonate: 31.8 mmol/L — ABNORMAL HIGH (ref 20.0–28.0)
Drawn by: 4956
O2 Saturation: 84.7 %
Patient temperature: 37.4
pCO2, Ven: 50 mmHg (ref 44–60)
pH, Ven: 7.41 (ref 7.25–7.43)
pO2, Ven: 50 mmHg — ABNORMAL HIGH (ref 32–45)

## 2023-07-20 LAB — LIPID PANEL
Cholesterol: 136 mg/dL (ref 0–200)
HDL: 46 mg/dL (ref >40)
LDL Cholesterol: 63 mg/dL (ref 0–99)
Total CHOL/HDL Ratio: 3 ratio
Triglycerides: 133 mg/dL (ref <150)
VLDL: 27 mg/dL (ref 0–40)

## 2023-07-20 LAB — MAGNESIUM: Magnesium: 2 mg/dL (ref 1.7–2.4)

## 2023-07-20 MED ORDER — FUROSEMIDE 10 MG/ML IJ SOLN
40.0000 mg | Freq: Once | INTRAMUSCULAR | Status: AC
  Administered 2023-07-20: 40 mg via INTRAVENOUS
  Filled 2023-07-20: qty 4

## 2023-07-20 MED ORDER — POLYETHYLENE GLYCOL 3350 17 G PO PACK: 17.0000 g | PACK | Freq: Every day | ORAL | Status: DC | PRN

## 2023-07-20 MED ORDER — SODIUM CHLORIDE 0.9% IV SOLUTION: Freq: Once | INTRAVENOUS | Status: AC

## 2023-07-20 MED ORDER — OSMOLITE 1.5 CAL PO LIQD
1000.0000 mL | ORAL | Status: DC
  Administered 2023-07-20: 1000 mL
  Filled 2023-07-20 (×2): qty 1000

## 2023-07-20 MED ORDER — PROSOURCE TF20 ENFIT COMPATIBL EN LIQD
60.0000 mL | Freq: Every day | ENTERAL | Status: DC
  Administered 2023-07-20: 60 mL
  Filled 2023-07-20: qty 60

## 2023-07-20 NOTE — Progress Notes (Addendum)
 NAME:  Joyce Rodriguez, MRN:  [id number], DOB:  1945/11/02, LOS: 2 ADMISSION DATE:  07/17/2023, CONSULTATION DATE:  07/19/2023 REFERRING MD:  Rolly Salter, MD, CHIEF COMPLAINT:  FALL   History of Present Illness:  Ms. Pitcher is a 78 year old female with a history of COPD, hypertension, hyperlipidemia, and GERD who was admitted for a fall and found to have left femoral neck fracture.  CT head was unremarkable for any acute abnormalities aside for small vessel that seemed chronic.  Her MRI head also showed acute infract in the right frontal white matter and left thalamus.  She had arthroplasty of the left hip and remained intubated postop due to acute hypoxic and hypercapnic respiratory failure and transferred to ICU for respiratory support.  Pertinent  Medical History   Past Medical History:  Diagnosis Date   Aortic atherosclerosis (HCC)    Arthritis    Complication of anesthesia    difficulty waking up   COPD (chronic obstructive pulmonary disease) (HCC)    Coronary artery disease    Dyspnea    Emphysema of lung (HCC)    SEVERE   GERD (gastroesophageal reflux disease)    Headache    H/O   History of hiatal hernia    Hyperlipidemia    Hypertension    H/O OFF MEDS SINCE 2020 BY PCP   Osteoporosis    Oxygen deficiency    uses O2 at night   Oxygen dependent    Pneumonia    Umbilical hernia      Significant Hospital Events: Including procedures, antibiotic start and stop dates in addition to other pertinent events   ARTHROPLASTY, HIP, TOTAL, ANTERIOR APPROACH (Left)   Interim History / Subjective:  Remained mechanically ventilated . Blinks to commands  Objective   Blood pressure 133/80, pulse (!) 105, temperature 98.4 F (36.9 C), temperature source Axillary, resp. rate 17, height 5\' 6"  (1.676 m), weight 62.4 kg, SpO2 99%.    Vent Mode: PSV;CPAP FiO2 (%):  [40 %-100 %] 40 % Set Rate:  [20 bmp-24 bmp] 24 bmp Vt Set:  [470 mL] 470 mL PEEP:  [5 cmH20] 5 cmH20 Pressure  Support:  [10 cmH20] 10 cmH20 Plateau Pressure:  [18 cmH20-22 cmH20] 20 cmH20   Intake/Output Summary (Last 24 hours) at 07/20/2023 0937 Last data filed at 07/20/2023 0800 Gross per 24 hour  Intake 3457.67 ml  Output 585 ml  Net 2872.67 ml   Filed Weights   07/17/23 2356 07/20/23 0400  Weight: 68 kg 62.4 kg    Examination: General: Minimally responsive Lungs: Lungs are clear to auscultation. Cardiovascular: Normal heart sounds, no murmurs appreciated Abdomen: Soft nontender Extremities: No lower extremity edema noted, Neuro: Alert, nonverbal, blinks to commands  Hospital Problem list   Left hip fracture status post arthroplasty COPD Chronic hypoxic respiratory failure  Assessment & Plan:  This patient has COPD that has progressively gotten worse over time and potentially made her fall in the setting on dyspnea and weakness.  She remained intubated postop and attempt to extubate seems unreasonable at this time given her current state.  Family is at bedside and palliative is involved regarding discussions of goals of care.  Will get a VBG to assess  hypercapnic status and attempt to extubate in the next 24 to 48 hours.  If patient continues to decline despite attempts to extubate, we will proceed with palliative discussions and comfort care measures as her wishes clearly states. - VBG - Reattempt to extubate when appropriate  - Gentle  diurese 40 mg IV Lasix - Trend Cr - Continue LAMA-LABA-Steroids  - Monitor urine output   - Continue discussions with palliative care - Pain control - F/U on blood cx  Best Practice (right click and "Reselect all SmartList Selections" daily)   Diet/type: NPO DVT prophylaxis: not indicated GI prophylaxis: N/A Lines: Arterial Line Foley:  Yes, and it is still needed Code Status:  DNR Last date of multidisciplinary goals of care discussion : Palliative is following possible discussions for comfort care to follow  Labs   CBC: Recent Labs  Lab  07/17/23 1707 07/18/23 0028 07/18/23 0035 07/18/23 1746 07/19/23 0126 07/19/23 1220 07/19/23 1744 07/19/23 1753 07/20/23 0451 07/20/23 0516  WBC 21.9* 28.5*   < > 24.3* 24.9* 23.5*  --  26.2* 15.9*  --   NEUTROABS 18.2* 27.1*  --   --   --   --   --   --   --   --   HGB 12.0 9.0*   < > 10.5* 9.7* 9.1* 7.8* 8.0* 7.1* 6.8*  HCT 39.2 29.2*   < > 33.3* 31.1* 29.0* 23.0* 26.8* 23.3* 20.0*  MCV 101.3* 100.7*   < > 95.7 96.9 97.3  --  101.5* 97.9  --   PLT 307 200   < > 169 170 152  --  129* 112*  --    < > = values in this interval not displayed.    Basic Metabolic Panel: Recent Labs  Lab 07/17/23 1707 07/18/23 0028 07/18/23 0035 07/18/23 0349 07/19/23 0734 07/19/23 1744 07/20/23 0323 07/20/23 0516  NA 139 136   < > 138 143 142 144 142  K 4.1 4.3   < > 4.7 5.0 4.3 4.3 4.2  CL 97* 98  --   --  104  --  106  --   CO2 34* 31  --   --  27  --  27  --   GLUCOSE 246* 217*  --   --  111*  --  129*  --   BUN 17 17  --   --  33*  --  35*  --   CREATININE 0.60 0.80  --   --  0.87  --  0.74  --   CALCIUM 8.9 8.0*  --   --  8.9  --  9.0  --   MG  --   --   --   --  2.2  --  2.0  --    < > = values in this interval not displayed.   GFR: Estimated Creatinine Clearance: 56 mL/min (by C-G formula based on SCr of 0.74 mg/dL). Recent Labs  Lab 07/17/23 1855 07/17/23 1907 07/18/23 0028 07/18/23 0342 07/18/23 0529 07/18/23 1324 07/19/23 0126 07/19/23 1220 07/19/23 1753 07/20/23 0451  PROCALCITON  --   --  1.72  --   --   --   --   --   --   --   WBC  --   --  28.5*   < >  --    < > 24.9* 23.5* 26.2* 15.9*  LATICACIDVEN 2.6* 2.1*  --   --  1.9  --   --   --   --   --    < > = values in this interval not displayed.    Liver Function Tests: Recent Labs  Lab 07/17/23 1707 07/18/23 0028 07/19/23 0734 07/20/23 0323  AST 23 19 18 17   ALT 27 23 20  20  ALKPHOS 54 35* 36* 22*  BILITOT 0.5 0.5 0.6 0.6  PROT 6.4* 4.5* 4.8* 5.0*  ALBUMIN 3.5 2.2* 2.6* 2.9*   No results for  input(s): "LIPASE", "AMYLASE" in the last 168 hours. Recent Labs  Lab 07/18/23 1746  AMMONIA 29    ABG    Component Value Date/Time   PHART 7.392 07/20/2023 0516   PCO2ART 48.2 (H) 07/20/2023 0516   PO2ART 59 (L) 07/20/2023 0516   HCO3 29.3 (H) 07/20/2023 0516   TCO2 31 07/20/2023 0516   O2SAT 90 07/20/2023 0516     Coagulation Profile: Recent Labs  Lab 07/17/23 1707 07/18/23 0028  INR 1.1 1.1    Cardiac Enzymes: No results for input(s): "CKTOTAL", "CKMB", "CKMBINDEX", "TROPONINI" in the last 168 hours.  HbA1C: Hgb A1c MFr Bld  Date/Time Value Ref Range Status  07/18/2023 03:42 AM 5.7 (H) 4.8 - 5.6 % Final    Comment:    (NOTE) Pre diabetes:          5.7%-6.4%  Diabetes:              >6.4%  Glycemic control for   <7.0% adults with diabetes     CBG: Recent Labs  Lab 07/19/23 1544 07/19/23 1955 07/19/23 2351 07/20/23 0410 07/20/23 0752  GLUCAP 95 131* 146* 131* 123*    Review of Systems:   Unable to assess   Past Medical History:  She,  has a past medical history of Aortic atherosclerosis (HCC), Arthritis, Complication of anesthesia, COPD (chronic obstructive pulmonary disease) (HCC), Coronary artery disease, Dyspnea, Emphysema of lung (HCC), GERD (gastroesophageal reflux disease), Headache, History of hiatal hernia, Hyperlipidemia, Hypertension, Osteoporosis, Oxygen deficiency, Oxygen dependent, Pneumonia, and Umbilical hernia.   Surgical History:   Past Surgical History:  Procedure Laterality Date   ABDOMINAL HYSTERECTOMY     CHOLECYSTECTOMY     ESOPHAGEAL MANOMETRY N/A 09/13/2017   Procedure: ESOPHAGEAL MANOMETRY (EM);  Surgeon: Napoleon Form, MD;  Location: [facility];  Service: Endoscopy;  Laterality: N/A;   ESOPHAGOGASTRODUODENOSCOPY (EGD) WITH PROPOFOL N/A 05/11/2017   Procedure: ESOPHAGOGASTRODUODENOSCOPY (EGD) WITH PROPOFOL;  Surgeon: Toney Reil, MD;  Location: [facility] ENDOSCOPY;  Service: Gastroenterology;  Laterality: N/A;    EYE SURGERY Bilateral    CATARACTS   FOOT SURGERY     MORTONS NEUROMA   LAPAROSCOPIC NISSEN FUNDOPLICATION N/A 10/19/2017   Procedure: LAPAROSCOPIC NISSEN FUNDOPLICATION;  Surgeon: Leafy Ro, MD;  Location: [facility] ORS;  Service: General;  Laterality: N/A;   NISSEN FUNDOPLICATION N/A 10/19/2017   Procedure: NISSEN FUNDOPLICATION;  Surgeon: Leafy Ro, MD;  Location: [facility] ORS;  Service: General;  Laterality: N/A;   REVERSE SHOULDER ARTHROPLASTY Left 09/10/2020   Procedure: REVERSE SHOULDER ARTHROPLASTY;  Surgeon: Christena Flake, MD;  Location: [facility] ORS;  Service: Orthopedics;  Laterality: Left;     Social History:   reports that she quit smoking about 22 years ago. Her smoking use included cigarettes. She started smoking about 52 years ago. She has a 45 pack-year smoking history. She has never used smokeless tobacco. She reports current alcohol use. She reports that she does not use drugs.   Family History:  Her family history includes Emphysema in her father; Lung cancer in her brother; Melanoma in her sister; Ovarian cancer in her mother.   Allergies Allergies  Allergen Reactions   Atorvastatin Other (See Comments)    Myalgias made legs and back hurt    Atrovent Nasal Spray [Ipratropium]     Difficulty breathing   Codeine  Nausea And Vomiting    Pt states that she can tolerate Tramadol fine.   Lipitor [Atorvastatin Calcium] Other (See Comments)    Myalgias     Home Medications  Prior to Admission medications   Medication Sig Start Date End Date Taking? Authorizing Provider  acetaminophen (TYLENOL) 500 MG tablet Take 1,000 mg by mouth every 6 (six) hours as needed for moderate pain.   Yes [provider]  albuterol (VENTOLIN HFA) 108 (90 Base) MCG/ACT inhaler Inhale 2 puffs into the lungs every 4 (four) hours as needed for wheezing or shortness of breath. 04/19/23 07/18/23 Yes Yevonne Pax, MD  ALPRAZolam Prudy Feeler) 0.25 MG tablet Take 0.5 tablets (0.125 mg total) by  mouth 2 (two) times daily as needed for anxiety. 08/13/22  Yes Yevonne Pax, MD  amitriptyline (ELAVIL) 25 MG tablet Take 25 mg by mouth at bedtime.   Yes [provider]  Budeson-Glycopyrrol-Formoterol (BREZTRI AEROSPHERE) 160-9-4.8 MCG/ACT AERO Inhale 2 puffs into the lungs 2 (two) times daily. 04/19/23  Yes Yevonne Pax, MD  busPIRone (BUSPAR) 10 MG tablet Take 1 tablet by mouth 2 (two) times daily. 06/23/23 06/22/24 Yes [provider]  carvedilol (COREG) 25 MG tablet Take 25 mg by mouth 2 (two) times daily.   Yes [provider]  Docusate Calcium (STOOL SOFTENER PO) Take 2 tablets by mouth daily.   Yes [provider]  escitalopram (LEXAPRO) 10 MG tablet Take 1 tablet (10 mg total) by mouth daily. Patient taking differently: Take 10 mg by mouth every morning. 11/24/18  Yes Crissman, Redge Gainer, MD  ezetimibe (ZETIA) 10 MG tablet Take 10 mg by mouth every morning. 11/02/19  Yes [provider]  furosemide (LASIX) 20 MG tablet Take 20 mg by mouth daily. 06/23/23  Yes [provider]  omeprazole (PRILOSEC) 20 MG capsule TAKE 1 CAPSULE BY MOUTH DAILY AS NEEDED FOR HEARTBURN 07/08/23  Yes Abernathy, Alyssa, NP  OXYGEN Inhale 2.5-3 L into the lungs at bedtime. 3 L portable   uses [employer] for oxygen   Yes [provider]  predniSONE (DELTASONE) 10 MG tablet Take 1 tablet (10 mg total) by mouth daily with breakfast. 04/19/23  Yes Yevonne Pax, MD  Roflumilast 250 MCG TABS Take 1 tablet by mouth daily. 02/26/23  Yes Abernathy, Arlyss Repress, NP  theophylline (THEO-24) 100 MG 24 hr capsule Take 1 capsule (100 mg total) by mouth daily. Take 1 capsule by mouth daily. 02/26/23  Yes Yevonne Pax, MD  traMADol (ULTRAM) 50 MG tablet Take 1-2 tablets (50-100 mg total) by mouth every 6 (six) hours as needed for moderate pain. 09/11/20  Yes Anson Oregon, PA-C  zinc gluconate 50 MG tablet Take 50 mg by mouth daily.   Yes [provider]      Critical care time: 40 minutes     Kathleen Lime MD [facility] Internal Medicine Program - PGY-1 07/20/2023, 9:37 AM Pager# [phone]

## 2023-07-20 NOTE — Progress Notes (Cosign Needed Addendum)
 Noted repair of pelvic fractures yesterday as well as new diagnosis of acute ischemic infarct within right frontal and left thalamic regions. Noted DNR/no plans for prolonged intubation per palliative meeting yesterday.  Patients hgb remains stable on re-check today (7.2 from 7.1). she is off of pressors and she is normotensive but she remains tachycardic. Given traumatic injury with pelvic hematoma, recent surgery, and infarct presumed related to transient hypoperfusion I think transfusing 1 u pRBC should be considered.   Remainder of care per pulmonary/critical care, neurology, palliative care, and orthopedic surgery. Please call trauma service as needed. We will sign off.    Hosie Spangle, PA-C [facility] Please see Amion for pager number during day hours 7:00am-4:30pm

## 2023-07-20 NOTE — Progress Notes (Signed)
   07/20/23 0753  Daily Weaning Assessment  Daily Assessment of Readiness to Wean Wean protocol criteria met (SBT performed)  SBT Method CPAP 5 cm H20 and PS 5 cm H20 (PS 10)  Weaning Start Time 0753   Pt was placed on PS/CPAP 10/5 and is tolerating well at this time.

## 2023-07-20 NOTE — Progress Notes (Signed)
 Daily Progress Note   Patient Name: Joyce Rodriguez       Date: 07/20/2023 DOB: 1946-01-13  Age: 78 y.o. MRN#: [id number] Attending Physician: Tarry Kos, MD Primary Care Physician: Marguarite Arbour, MD Admit Date: 07/17/2023  Reason for Consultation/Follow-up: Establishing goals of care  Subjective: Medical records reviewed including progress notes, labs, imaging. Patient assessed at the bedside. She is intubated, tolerating weaning trial though restless. Her 2 sons are present visiting.  Created space and opportunity for family's thoughts and feelings on her current illness. Her sons recognize that she is not comfortable with being intubated and feel "she is mad at [place]." They are very clear on her wishes to avoid prolonged intubation and hope for extubation later this evening. They speak to her strength and agree it was important to set limitations in advance during yesterday's family meeting. They do not have further questions or concerns at this time.  Questions and concerns addressed. PMT will continue to support holistically.   Length of Stay: 2   Physical Exam Vitals and nursing note reviewed.  Constitutional:      Appearance: She is ill-appearing.     Interventions: She is intubated.     Comments: Appears uncomfortable  Cardiovascular:     Rate and Rhythm: Normal rate.  Pulmonary:     Effort: Pulmonary effort is normal. She is intubated.     Comments: PS/CPAP Psychiatric:        Behavior: Behavior is agitated.            Vital Signs: BP 114/73   Pulse (!) 102   Temp 98.4 F (36.9 C) (Axillary)   Resp (!) 24   Ht 5\' 6"  (1.676 m)   Wt 62.4 kg   SpO2 99%   BMI 22.20 kg/m  SpO2: SpO2: 99 % O2 Device: O2 Device: Ventilator O2 Flow Rate: O2 Flow Rate (L/min): 4 L/min      Palliative Assessment/Data: tbd   Palliative  Care Assessment & Plan   Patient Profile: 78 y.o. female  with past medical history of severe COPD, chronic respiratory failure, CAD, GERD, HLD, and HTN who presented to [facility] ED on 07/17/2023 after a fall at home.  She was found to have a left femoral neck fracture, bilateral pelvic fractures, and extraperitoneal hematoma. She was placed on BiPAP in the ED due to increased work of breathing. She was transferred to [facility] for orthopedics consult and hip repair.    Since admission, patient's mental status has been significantly altered from baseline. MRI brain on 4/7 showed evidence of punctuate acute infarct within the right frontal white matter and left thalamus. Per neurology, unlikely that stroke seen on MRI is sole cause of the confusion.    Palliative Medicine was consulted for GOC and medical decision making.   Assessment: Goals of care conversation Hip fracture s/p arthroplasty  BL pelvic fractures severe COPD, acute exacerbation Acute on chronic respiratory failure  Recommendations/Plan: Continue DNR Goals are clear for avoidance of prolonged intubation, ultimately hope for medical stabilization and recovery Ongoing GOC discussions pending clinical course Psychosocial and emotional support provided PMT will continue to follow and support   Prognosis:  Unable to determine  Discharge  Planning: To Be Determined  Care plan was discussed with Patient's 2 sons   Total time: I spent 35 minutes in the care of the patient today in the above activities and documenting the encounter.          Franchesca Veneziano Jeni Salles, PA-C  Palliative Medicine Team Team phone # [phone]  Thank you for allowing the Palliative Medicine Team to assist in the care of this patient. Please utilize secure chat with additional questions, if there is no response within 30 minutes please call the above phone number.  Palliative Medicine Team providers are available by phone from 7am to 7pm daily and can be  reached through the team cell phone.  Should this patient require assistance outside of these hours, please call the patient's attending physician.

## 2023-07-20 NOTE — Progress Notes (Addendum)
 STROKE TEAM PROGRESS NOTE    SIGNIFICANT HOSPITAL EVENTS 4/5-patient admitted with left femoral fracture 4/7-repair of left femoral fracture completed, patient found to have several punctate infarcts  INTERIM HISTORY/SUBJECTIVE Multiple family members are at the bedside Patient has remained hemodynamically stable and afebrile overnight.  She underwent repair of her left femoral fracture yesterday.  She is in weaning mode on the ventilator and will hopefully be extubated soon. MRI scan of the brain shows tiny punctate acute infarcts in right frontal white matter and left thalamus.  Old left cerebellar infarct is noted.  There were changes of chronic small vessel disease.  Carotid ultrasound and transcranial Doppler studies are pending. OBJECTIVE  CBC    Component Value Date/Time   WBC 15.9 (H) 07/20/2023 0451   RBC 2.38 (L) 07/20/2023 0451   HGB 6.8 (LL) 07/20/2023 0516   HGB 13.0 01/23/2019 0938   HCT 20.0 (L) 07/20/2023 0516   HCT 39.8 01/23/2019 0938   PLT 112 (L) 07/20/2023 0451   PLT 399 01/23/2019 0938   MCV 97.9 07/20/2023 0451   MCV 95 01/23/2019 0938   MCH 29.8 07/20/2023 0451   MCHC 30.5 07/20/2023 0451   RDW 15.2 07/20/2023 0451   RDW 11.8 01/23/2019 0938   LYMPHSABS 0.2 (L) 07/18/2023 0028   LYMPHSABS 0.8 01/23/2019 0938   MONOABS 1.0 07/18/2023 0028   EOSABS 0.0 07/18/2023 0028   EOSABS 0.0 01/23/2019 0938   BASOSABS 0.1 07/18/2023 0028   BASOSABS 0.0 01/23/2019 0938    BMET    Component Value Date/Time   NA 142 07/20/2023 0516   NA 136 01/23/2019 0938   K 4.2 07/20/2023 0516   CL 106 07/20/2023 0323   CO2 27 07/20/2023 0323   GLUCOSE 129 (H) 07/20/2023 0323   BUN 35 (H) 07/20/2023 0323   BUN 15 01/23/2019 0938   CREATININE 0.74 07/20/2023 0323   CALCIUM 9.0 07/20/2023 0323   GFRNONAA >60 07/20/2023 0323    IMAGING past 24 hours Portable Chest xray Result Date: 07/20/2023 CLINICAL DATA:  Respirator dependence. EXAM: PORTABLE CHEST 1 VIEW COMPARISON:   July 19, 2023. FINDINGS: Stable cardiomediastinal silhouette. Endotracheal and nasogastric tubes are unchanged. Status post left shoulder arthroplasty. Lungs are clear. Bony thorax is unremarkable. IMPRESSION: Stable support apparatus. No acute cardiopulmonary abnormality seen. Electronically Signed   By: Lupita Raider M.D.   On: 07/20/2023 09:52   DG Abd Portable 1V Result Date: 07/19/2023 CLINICAL DATA:  Orogastric tube placement. EXAM: PORTABLE ABDOMEN - 1 VIEW COMPARISON:  None Available. FINDINGS: Tip and side port of the enteric tube below the diaphragm in the stomach. No bowel dilatation in the included upper abdomen. IMPRESSION: Tip and side port of the enteric tube below the diaphragm in the stomach. Electronically Signed   By: Narda Rutherford M.D.   On: 07/19/2023 21:44   DG Chest Port 1 View Result Date: 07/19/2023 CLINICAL DATA:  Acute hypoxemic respiratory failure. EXAM: PORTABLE CHEST 1 VIEW COMPARISON:  Radiograph 07/17/2023, CT 07/18/2023 FINDINGS: Endotracheal tube tip is approximately 5.2 cm from the carina. Tip and side port of the enteric tube below the diaphragm in the stomach. Hyperinflation with emphysema. Stable heart size and mediastinal contours. Small left pleural effusion. No pneumothorax. No confluent airspace disease. IMPRESSION: 1. Endotracheal tube tip approximately 5.2 cm from the carina. 2. Enteric tube tip and side port in the stomach. 3. Small left pleural effusion. 4. Hyperinflation with emphysema. Electronically Signed   By: Narda Rutherford M.D.   On:  07/19/2023 21:43   DG Pelvis Portable Result Date: 07/19/2023 CLINICAL DATA:  [id number] Deficient knowledge of open reduction and internal (ORIF) fixation of hip [id number] EXAM: PORTABLE PELVIS 1-2 VIEWS COMPARISON:  Pelvic CT 07/17/2023 FINDINGS: Left hip arthroplasty in expected alignment. No periprosthetic lucency or fracture. Recent postsurgical change includes air and edema in the soft tissues. The right pubic body and  pubic rami on prior CT are not well seen on the current exam. IMPRESSION: Left hip arthroplasty without immediate postoperative complication. Electronically Signed   By: Narda Rutherford M.D.   On: 07/19/2023 21:42   DG HIP UNILAT WITH PELVIS 1V LEFT Result Date: 07/19/2023 CLINICAL DATA:  Elective surgery. EXAM: DG HIP (WITH OR WITHOUT PELVIS) 1V*L* COMPARISON:  Preoperative imaging FINDINGS: Nine fluoroscopic spot views of the pelvis and left hip obtained in the operating room. Sequential images during hip arthroplasty. Fluoroscopy time 11 seconds. Dose 1.1 mGy. IMPRESSION: Intraoperative fluoroscopy during left hip arthroplasty. Electronically Signed   By: Narda Rutherford M.D.   On: 07/19/2023 14:55   DG C-Arm 1-60 Min-No Report Result Date: 07/19/2023 Fluoroscopy was utilized by the requesting physician.  No radiographic interpretation.    Vitals:   07/20/23 0600 07/20/23 0700 07/20/23 0749 07/20/23 0800  BP: 115/72 114/73  133/80  Pulse: (!) 107 (!) 102  (!) 105  Resp: (!) 24 (!) 24  17  Temp:   98.4 F (36.9 C)   TempSrc:   Axillary   SpO2: 99% 99%  99%  Weight:      Height:         PHYSICAL EXAM General: Ill-appearing, intubated elderly patient in no acute distress Psych:  Mood and affect appropriate for situation CV: Regular rate and rhythm on monitor Respiratory: Respirations synchronous with ventilator   NEURO (sedated on low-dose Precedex):  Patient will open eyes to voice but does not follow commands.  Pupils equal round and reactive to light in midline position.  Cough and gag present, patient is able to localize sternal rub with bilateral upper extremities, will withdraw bilateral lower extremities to noxious  Most Recent NIH  1a Level of Conscious.: 1 1b LOC Questions: 2 1c LOC Commands: 2 2 Best Gaze: 1 3 Visual: 0 4 Facial Palsy: 0 5a Motor Arm - left: 2 5b Motor Arm - Right: 2 6a Motor Leg - Left: 3 6b Motor Leg - Right: 3 7 Limb Ataxia: 0 8 Sensory: 0 9  Best Language: 3 10 Dysarthria: 2 11 Extinct. and Inatten.: 0 TOTAL: 21   ASSESSMENT/PLAN  Ms. Shamarie Call is a 78 y.o. female with history of COPD, chronic respiratory failure, CAD, hypertension, hyperlipidemia and GERD who was initially admitted after a fall with a left femoral fracture.  She was then found to be confused, and MRI demonstrates punctate infarcts in the left thalamus and right frontal lobe.  Infarcts are likely due to small vessel disease, possibly in the setting of transient hypotension from opioid pain medication use and do not explain patient's altered mental status.  More likely etiology of this is toxic metabolic encephalopathy.  She has now undergone repair of her left femoral fracture and is intubated and sedated with low-dose Precedex.  Will further evaluate deficits due to stroke when patient has been extubated.  Acute Ischemic Infarct: Punctate acute infarcts within right frontal white matter and left thalamus Etiology: Likely synchronous small vessel disease in the setting of probable transient hypotension from pain medication use.  Doubt fat embolism or cardiogenic embolism due  to mostly subcortical nature of the strokes CT head No acute abnormality. Small vessel disease. Atrophy.  Chronic left cerebellar infarct CTA head & neck pending MRI punctate acute infarcts in right frontal white matter and left thalamus, old cerebellar infarct on the left and chronic small vessel ischemic disease 2D Echo EF 55%, mild LVH, mild mitral valve regurgitation, mild to moderate tricuspid valve regurgitation, trivial aortic valve regurgitation, normal left atrial size, no atrial level shunt LDL 63 HgbA1c 5.7 VTE prophylaxis -SCDs No antithrombotic prior to admission, now on No antithrombotic due to postoperative hemoglobin of 6.8 today, would like to start aspirin 81 mg daily  when primary team approves Therapy recommendations:  Pending Disposition: Pending  Hx of  Stroke/TIA 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  Hypertension Home meds: Carvedilol 25 mg twice daily Stable Blood Pressure Goal: BP less than 220/110   Hyperlipidemia Home meds: Ezetimibe 10 mg daily LDL 63, goal < 70 High intensity statin not indicated as LDL below goal Continue statin at discharge  Respiratory failure Patient left intubated after repair of left femoral fracture Currently in weaning mode on the ventilator Ventilator management per CCM Extubate when able  Dysphagia Patient has post-stroke dysphagia, SLP consulted    Diet   Diet NPO time specified   Advance diet as tolerated  Other Stroke Risk Factors Coronary artery disease   Other Active Problems Toxic metabolic encephalopathy with likely component of delirium-delirium precautions and treatment of toxic metabolic encephalopathy per primary team  Hospital day # 2  Patient seen by NP with MD, MD to edit note as needed. Cortney E Ernestina Columbia , MSN, AGACNP-BC [facility] See Amion for schedule and pager information 07/20/2023 10:43 AM   I have personally obtained history,examined this patient, reviewed notes, independently viewed imaging studies, participated in medical decision making and plan of care.ROS completed by me personally and pertinent positives fully documented  I have made any additions or clarifications directly to the above note. Agree with note above.  Patient presented with a fall due to hip fracture and subsequently developed altered mental status after receiving Dilaudid.  MRI scan shows tiny punctate right frontal and left thalamic lacunar infarcts likely from small vessel disease in the setting of possible hypertension from pain medication usage.  Continue ongoing stroke workup and check carotid ultrasound transcranial Doppler studies.  Consider starting aspirin when hemodynamically stable and okay with the primary team.  Extubate as tolerated.  Long discussion with  patient and multiple family members at the bedside and answered questions.  Discussed with Dr. Denese Killings critical care medicine. This patient is critically ill and at significant risk of neurological worsening, death and care requires constant monitoring of vital signs, hemodynamics,respiratory and cardiac monitoring, extensive review of multiple databases, frequent neurological assessment, discussion with family, other specialists and medical decision making of high complexity.I have made any additions or clarifications directly to the above note.This critical care time does not reflect procedure time, or teaching time or supervisory time of PA/NP/Med Resident etc but could involve care discussion time.  I spent 30 minutes of neurocritical care time  in the care of  this patient.      Delia Heady, MD Medical Director [facility] Pager: [phone] 07/20/2023 3:07 PM   To contact Stroke Continuity provider, please refer to [email or web address]. After hours, contact General Neurology

## 2023-07-20 NOTE — Progress Notes (Signed)
 SLP Cancellation Note  Patient Details Name: Joyce Rodriguez MRN: [id number] DOB: April 27, 1945   Cancelled treatment:        Attempted to see pt for swallow assessment. Pt remains intubated post surgery at this time, but with plans for hopeful extubation later this date.  SLP will continue to follow for evaluation post-extubation.   Kerrie Pleasure, MA, CCC-SLP Acute Rehabilitation Services Office: [phone] 07/20/2023, 8:15 AM

## 2023-07-20 NOTE — Progress Notes (Signed)
 PT Cancellation Note  Patient Details Name: Joyce Rodriguez MRN: [id number] DOB: 01-28-1946   Cancelled Treatment:    Reason Eval/Treat Not Completed: Medical issues which prohibited therapy (pt remains intubated post-op and not yet medically ready for therapy)   Haileigh Pitz B Burleigh Brockmann 07/20/2023, 9:23 AM Merryl Hacker, PT Acute Rehabilitation Services Office: [phone]

## 2023-07-20 NOTE — Anesthesia Postprocedure Evaluation (Signed)
 Anesthesia Post Note  Patient: Joyce Rodriguez  Procedure(Rodriguez) Performed: ARTHROPLASTY, HIP, TOTAL, ANTERIOR APPROACH (Left: Hip)     Patient location during evaluation: SICU Anesthesia Type: General Level of consciousness: sedated Pain management: pain level controlled Vital Signs Assessment: post-procedure vital signs reviewed and stable Respiratory status: patient remains intubated per anesthesia plan Cardiovascular status: stable Postop Assessment: no apparent nausea or vomiting Anesthetic complications: no   No notable events documented.  Last Vitals:  Vitals:   07/20/23 0548 07/20/23 0600  BP:  115/72  Pulse: (!) 109 (!) 107  Resp: 10 (!) 24  Temp:    SpO2: 99% 99%    Last Pain:  Vitals:   07/20/23 0400  TempSrc: Axillary  PainSc:                  Joyce Rodriguez

## 2023-07-20 NOTE — TOC Initial Note (Signed)
 Transition of Care [facility]) - Initial/Assessment Note    Patient Details  Name: Joyce Rodriguez MRN: [id number] Date of Birth: [birth year]  Transition of Care [facility]) CM/SW Contact:    Lawerance Sabal, RN Phone Number: 07/20/2023, 9:34 AM  Clinical Narrative:                  Per chartreview Patient admitted from home after fall resulting in pelvic fracture Patient remains intubated after surgery, in ICU. PT OT evals pending TOC will follow for DC planning needs.  Expected Discharge Plan:  (TBD) Barriers to Discharge: Continued Medical Work up   Patient Goals and CMS Choice            Expected Discharge Plan and Services                                              Prior Living Arrangements/Services                       Activities of Daily Living      Permission Sought/Granted                  Emotional Assessment              Admission diagnosis:  Fall [W19.XXXA] Left displaced femoral neck fracture (HCC) [S72.002A] Pelvic hematoma in female [N94.89] Acute respiratory failure with hypoxia and hypercapnia (HCC) [J96.01, J96.02] Fx femoral neck (HCC) [S72.009A] Patient Active Problem List   Diagnosis Date Noted   Fx femoral neck (HCC) 07/19/2023   Fall 07/18/2023   Acute metabolic encephalopathy 07/18/2023   Bilateral pubic rami fractures, closed, initial encounter (HCC) 07/18/2023   Closed subcapital fracture of femur, left, initial encounter (HCC) 07/18/2023   Hematoma of extraperitoneal space 07/18/2023   Normocytic anemia 07/18/2023   Sinus tachycardia 07/18/2023   Status post reverse arthroplasty of shoulder, left 09/10/2020   Primary osteoarthritis of left shoulder 12/04/2019   Primary osteoarthritis of right shoulder 12/04/2019   Rotator cuff tendinitis, left 12/04/2019   Statin myopathy 11/02/2019   Umbilical hernia with obstruction, without gangrene 05/31/2019   Panlobular emphysema (HCC) 05/08/2019   Recurrent major  depressive disorder, in partial remission (HCC) 05/08/2019   Obstructive chronic bronchitis with exacerbation (HCC) 01/30/2019   Oxygen dependent 01/30/2019   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) 10/19/2017   CAD (coronary artery disease), native coronary artery 10/18/2017   Hiatal hernia 10/15/2017   Abnormal EKG 10/15/2017   Dysphagia    Schatzki's ring    Gastric erosion    Advanced care planning/counseling discussion 04/21/2017   Chronic respiratory failure with hypoxia and hypercapnia (HCC) 05/08/2016   COPD exacerbation (HCC) 05/08/2016   Hyperglycemia 05/08/2016   Dyspnea 05/06/2016   Encounter for hepatitis C screening test for low risk patient 03/19/2016   Essential hypertension 03/19/2015   COPD (chronic obstructive pulmonary disease) (HCC) 03/19/2015   Hyperlipidemia 03/19/2015   Osteoarthritis of both hands 03/19/2015   Osteoporosis 03/19/2015   Acid reflux 03/19/2015   Major depression, chronic 09/12/2014   PCP:  Marguarite Arbour, MD Pharmacy:   [facility] [phone] [address], [place] - [address] AT [facility] OF [address] [place] [address] [place] [address] Phone: [phone] Fax: [phone]     Social Drivers of Health (SDOH) Social History:  SDOH Screenings   Food Insecurity: Patient Unable To Answer (07/19/2023)  Housing: Low Risk  (06/23/2023)   Received from [facility]  Transportation Needs: No Transportation Needs (06/23/2023)   Received from [facility]  Utilities: Not At Risk (06/23/2023)   Received from [facility]  Alcohol Screen: Low Risk  (04/21/2018)  Depression (PHQ2-9): Low Risk  (01/23/2019)  Financial Resource Strain: Low Risk  (06/23/2023)   Received from [facility]  Physical Activity: Inactive (04/19/2017)  Social Connections: Unknown (07/18/2023)  Stress: No Stress Concern Present (04/19/2017)  Tobacco Use: Medium Risk  (07/17/2023)   SDOH Interventions:     Readmission Risk Interventions     No data to display

## 2023-07-20 NOTE — Progress Notes (Signed)
 Initial Nutrition Assessment  DOCUMENTATION CODES:  Not applicable  INTERVENTION:  Initiate tube feeding via OGT: Osmolite 1.5 at 45 ml/h (1080 ml per day) Start at 25mL and advance by 10mL q8h to goal Prosource TF20 60 ml 1x/d Provides 1700 kcal, 88 gm protein, 823 ml free water daily  NUTRITION DIAGNOSIS:  Increased nutrient needs related to post-op healing as evidenced by estimated needs.  GOAL:  Patient will meet greater than or equal to 90% of their needs  MONITOR:  TF tolerance, I & O's, Vent status, Labs  REASON FOR ASSESSMENT:  Ventilator    ASSESSMENT:  Pt with hx of COPD (emphysema), CAD, GERD (hx of Nissen procedure), HTN, and HLD presented to [facility] ED after a fall at home. Imaging showed a left hip fx and pubic ramus fx. Transferred to [facility] for repair.   Pt with ongoing AMS and delirium after admission. MRI obtained shows punctate infarcts in the left thalamus and right frontal lobe.  4/7 - OR for repair of left displaced femoral neck fracture, left intubated after surgery, admitted to ICU   Patient is currently intubated on ventilator support family present at bedside able to provide a nutrition hx. Daughter reports that at baseline, pt has a very good appetite. Lives at home with her husband who takes great care of her and always makes sure she is eating well. Eats 3 meals and snacks. Reports that weight has been stable.  Does report that pt's COPD has gotten progressively worse and that she get winded easily. Mostly sedentary during the day, but gets up to the chair, ambulates short distances in the house.  On exam, pt with good fat stores and only some mild muscle depletions which are likely due in part to normal aging and decreased mobility with SOB. Pt eating well up until the day of admission, will start feeds at 1/2 rate and advance to goal.   MV: 6.6 L/min Temp (24hrs), Avg:98.5 F (36.9 C), Min:96.8 F (36 C), Max:100.1 F (37.8 C) MAP (cuff ):  110 mmHg  Admit weight: 68 kg ? accuracy  Current weight: 62.4 kg   Intake/Output Summary (Last 24 hours) at 07/20/2023 1347 Last data filed at 07/20/2023 1300 Gross per 24 hour  Intake 3961.25 ml  Output 685 ml  Net 3276.25 ml  Net IO Since Admission: 4,487.79 mL [07/20/23 1347]  Drains/Lines: OGT (gastric) UOP x 24 hours  Average Meal Intake: 4/6: 0% intake x 1 recorded meals  Nutritionally Relevant Medications: Scheduled Meds:  cyanocobalamin  1,000 mcg Subcutaneous Q0600   docusate  100 mg Per Tube BID   insulin aspart  0-9 Units Subcutaneous Q4H   SOLU-MEDROL  40 mg Intravenous Daily   pantoprazole IV  40 mg Intravenous QHS   polyethylene glycol  17 g Per Tube Daily   thiamine (VITAMIN B1)  100 mg Intravenous Daily   Continuous Infusions:  cefTRIAXone (ROCEPHIN)  IV Stopped (07/19/23 2215)   doxycycline (VIBRAMYCIN) IV Stopped (07/20/23 0023)   lactated ringers 75 mL/hr at 07/20/23 0800   PRN Meds: ondansetron, polyethylene glycol  Labs Reviewed: BUN 35 CBG ranges from 95-153 mg/dL over the last 24 hours HgbA1c 5.7%  NUTRITION - FOCUSED PHYSICAL EXAM: Flowsheet Row Most Recent Value  Orbital Region No depletion  Upper Arm Region No depletion  Thoracic and Lumbar Region No depletion  Buccal Region No depletion  Temple Region No depletion  Clavicle Bone Region No depletion  Clavicle and Acromion Bone Region No depletion  Scapular Bone Region No depletion  Dorsal Hand No depletion  Patellar Region Mild depletion  Anterior Thigh Region Mild depletion  Posterior Calf Region Mild depletion  Edema (RD Assessment) Mild  Hair Reviewed  Eyes Reviewed  Mouth Reviewed  Skin Reviewed  Nails Reviewed    Diet Order:   Diet Order             Diet NPO time specified  Diet effective now                   EDUCATION NEEDS:  Not appropriate for education at this time  Skin:  Skin Assessment: Reviewed RN Assessment (surgical incisions)  Last BM:   unsure  Height:  Ht Readings from Last 1 Encounters:  07/17/23 5\' 6"  (1.676 m)    Weight:  Wt Readings from Last 1 Encounters:  07/20/23 62.4 kg    Ideal Body Weight:  59.1 kg  BMI:  Body mass index is 22.2 kg/m.  Estimated Nutritional Needs:  Kcal:  1500-1800 kcal/d Protein:  75-90 g/d Fluid:  1.5-1.8L/d    Greig Castilla, RD, LDN Registered Dietitian II Please reach out via secure chat

## 2023-07-20 NOTE — Progress Notes (Signed)
 eLink Physician-Brief Progress Note Patient Name: Joyce Rodriguez DOB: Nov 02, 1945 MRN: [id number]   Date of Service  07/20/2023  HPI/Events of Note  Hypotension and a hemoglobin that went from 6.8 gm to 7.2 gm / dl following transfusion of one unit of PRBC, patient is s/p total hip arthroplasty.  eICU Interventions  Transfusion of one unit of PRBC ordered.        Thomasene Lot Abriella Filkins 07/20/2023, 9:20 PM

## 2023-07-20 NOTE — Progress Notes (Addendum)
 Subjective: 1 Day Post-Op Procedure(s) (LRB): ARTHROPLASTY, HIP, TOTAL, ANTERIOR APPROACH (Left) Patient currently intubated.    Objective: Vital signs in last 24 hours: Temp:  [96.8 F (36 C)-98.9 F (37.2 C)] 98.4 F (36.9 C) (04/08 0749) Pulse Rate:  [84-127] 102 (04/08 0700) Resp:  [3-26] 24 (04/08 0700) BP: (77-156)/(52-88) 114/73 (04/08 0700) SpO2:  [93 %-100 %] 99 % (04/08 0700) FiO2 (%):  [50 %-100 %] 50 % (04/08 0400) Weight:  [62.4 kg] 62.4 kg (04/08 0400)  Intake/Output from previous day: 04/07 0701 - 04/08 0700 In: 3370.6 [I.V.:2257.3; IV Piggyback:1073.4] Out: 585 [Urine:385; Blood:200] Intake/Output this shift: No intake/output data recorded.  Recent Labs    07/19/23 1220 07/19/23 1744 07/19/23 1753 07/20/23 0451 07/20/23 0516  HGB 9.1* 7.8* 8.0* 7.1* 6.8*   Recent Labs    07/19/23 1753 07/20/23 0451 07/20/23 0516  WBC 26.2* 15.9*  --   RBC 2.64* 2.38*  --   HCT 26.8* 23.3* 20.0*  PLT 129* 112*  --    Recent Labs    07/19/23 0734 07/19/23 1744 07/20/23 0323 07/20/23 0516  NA 143   < > 144 142  K 5.0   < > 4.3 4.2  CL 104  --  106  --   CO2 27  --  27  --   BUN 33*  --  35*  --   CREATININE 0.87  --  0.74  --   GLUCOSE 111*  --  129*  --   CALCIUM 8.9  --  9.0  --    < > = values in this interval not displayed.   Recent Labs    07/17/23 1707 07/18/23 0028  INR 1.1 1.1    Neurovascular intact Intact pulses distally Incision: dressing C/D/I No cellulitis present Compartment soft   Assessment/Plan: 1 Day Post-Op Procedure(s) (LRB): ARTHROPLASTY, HIP, TOTAL, ANTERIOR APPROACH (Left) PLAN Up with PT when able WBAT LLE Dvt ppx-scds.  Unsure what pharmacologic anticoagulant recommended as it looks like possible asa/plavix vs lovenox po.   Post-op pain meds- trying to wean at this point.  Have not written a hard rx due to breathing issues Follow up with [facility] two weeks po      Cristie Hem 07/20/2023, 8:08 AM

## 2023-07-21 ENCOUNTER — Encounter (HOSPITAL_COMMUNITY): Payer: Self-pay | Admitting: Orthopaedic Surgery

## 2023-07-21 DIAGNOSIS — J9621 Acute and chronic respiratory failure with hypoxia: Secondary | ICD-10-CM | POA: Diagnosis not present

## 2023-07-21 DIAGNOSIS — N9489 Other specified conditions associated with female genital organs and menstrual cycle: Secondary | ICD-10-CM | POA: Diagnosis not present

## 2023-07-21 DIAGNOSIS — J449 Chronic obstructive pulmonary disease, unspecified: Secondary | ICD-10-CM | POA: Diagnosis not present

## 2023-07-21 DIAGNOSIS — S72012A Unspecified intracapsular fracture of left femur, initial encounter for closed fracture: Secondary | ICD-10-CM | POA: Diagnosis not present

## 2023-07-21 DIAGNOSIS — I6381 Other cerebral infarction due to occlusion or stenosis of small artery: Secondary | ICD-10-CM | POA: Diagnosis not present

## 2023-07-21 DIAGNOSIS — I69391 Dysphagia following cerebral infarction: Secondary | ICD-10-CM | POA: Diagnosis not present

## 2023-07-21 DIAGNOSIS — J9622 Acute and chronic respiratory failure with hypercapnia: Secondary | ICD-10-CM | POA: Diagnosis not present

## 2023-07-21 DIAGNOSIS — J9601 Acute respiratory failure with hypoxia: Secondary | ICD-10-CM | POA: Diagnosis not present

## 2023-07-21 DIAGNOSIS — I739 Peripheral vascular disease, unspecified: Secondary | ICD-10-CM | POA: Diagnosis not present

## 2023-07-21 DIAGNOSIS — R29721 NIHSS score 21: Secondary | ICD-10-CM | POA: Diagnosis not present

## 2023-07-21 DIAGNOSIS — S72002A Fracture of unspecified part of neck of left femur, initial encounter for closed fracture: Secondary | ICD-10-CM | POA: Diagnosis not present

## 2023-07-21 DIAGNOSIS — Z7189 Other specified counseling: Secondary | ICD-10-CM | POA: Diagnosis not present

## 2023-07-21 LAB — TYPE AND SCREEN
ABO/RH(D): O POS
Antibody Screen: NEGATIVE
Unit division: 0
Unit division: 0

## 2023-07-21 LAB — COMPREHENSIVE METABOLIC PANEL WITH GFR
ALT: 23 U/L (ref 0–44)
AST: 24 U/L (ref 15–41)
Albumin: 3.1 g/dL — ABNORMAL LOW (ref 3.5–5.0)
Alkaline Phosphatase: 30 U/L — ABNORMAL LOW (ref 38–126)
Anion gap: 13 (ref 5–15)
BUN: 45 mg/dL — ABNORMAL HIGH (ref 8–23)
CO2: 26 mmol/L (ref 22–32)
Calcium: 9.1 mg/dL (ref 8.9–10.3)
Chloride: 107 mmol/L (ref 98–111)
Creatinine, Ser: 0.84 mg/dL (ref 0.44–1.00)
GFR, Estimated: >60 mL/min (ref >60)
Glucose, Bld: 181 mg/dL — ABNORMAL HIGH (ref 70–99)
Potassium: 3.5 mmol/L (ref 3.5–5.1)
Sodium: 146 mmol/L — ABNORMAL HIGH (ref 135–145)
Total Bilirubin: 0.7 mg/dL (ref 0.0–1.2)
Total Protein: 5.4 g/dL — ABNORMAL LOW (ref 6.5–8.1)

## 2023-07-21 LAB — BPAM RBC
Blood Product Expiration Date: 202504302359
Blood Product Expiration Date: 202505072359
ISSUE DATE / TIME: 202504060115
ISSUING PHYSICIAN: 202504082131
Unit Type and Rh: 5100
Unit Type and Rh: 5100

## 2023-07-21 LAB — GLUCOSE, CAPILLARY
Glucose-Capillary: 162 mg/dL — ABNORMAL HIGH (ref 70–99)
Glucose-Capillary: 168 mg/dL — ABNORMAL HIGH (ref 70–99)
Glucose-Capillary: 190 mg/dL — ABNORMAL HIGH (ref 70–99)

## 2023-07-21 LAB — PHOSPHORUS: Phosphorus: 2.9 mg/dL (ref 2.5–4.6)

## 2023-07-21 LAB — MAGNESIUM: Magnesium: 2 mg/dL (ref 1.7–2.4)

## 2023-07-21 LAB — VITAMIN B1: Vitamin B1 (Thiamine): 129.9 nmol/L (ref 66.5–200.0)

## 2023-07-21 MED ORDER — LORAZEPAM 2 MG/ML IJ SOLN: 2.0000 mg | INTRAMUSCULAR | Status: DC | PRN

## 2023-07-21 MED ORDER — POLYVINYL ALCOHOL 1.4 % OP SOLN: 1.0000 [drp] | Freq: Four times a day (QID) | OPHTHALMIC | Status: DC | PRN

## 2023-07-21 MED ORDER — ACETAMINOPHEN 650 MG RE SUPP: 650.0000 mg | Freq: Four times a day (QID) | RECTAL | Status: DC | PRN

## 2023-07-21 MED ORDER — POTASSIUM CHLORIDE 20 MEQ PO PACK: 40.0000 meq | PACK | Freq: Once | ORAL | Status: DC

## 2023-07-21 MED ORDER — GLYCOPYRROLATE 0.2 MG/ML IJ SOLN: 0.2000 mg | INTRAMUSCULAR | Status: DC | PRN

## 2023-07-21 MED ORDER — SODIUM CHLORIDE 0.9 % IV SOLN: INTRAVENOUS | Status: DC

## 2023-07-21 MED ORDER — GLYCOPYRROLATE 1 MG PO TABS: 1.0000 mg | ORAL_TABLET | ORAL | Status: DC | PRN

## 2023-07-21 MED ORDER — FUROSEMIDE 10 MG/ML IJ SOLN: 80.0000 mg | Freq: Once | INTRAMUSCULAR | Status: DC

## 2023-07-21 MED ORDER — ACETAMINOPHEN 325 MG PO TABS: 650.0000 mg | ORAL_TABLET | Freq: Four times a day (QID) | ORAL | Status: DC | PRN

## 2023-07-21 MED ORDER — MORPHINE 100MG IN NS 100ML (1MG/ML) PREMIX INFUSION
0.0000 mg/h | INTRAVENOUS | Status: DC
  Administered 2023-07-21: 5 mg/h via INTRAVENOUS
  Filled 2023-07-21: qty 100

## 2023-07-21 MED ORDER — MORPHINE BOLUS VIA INFUSION
5.0000 mg | INTRAVENOUS | Status: DC | PRN
  Administered 2023-07-21: 5 mg via INTRAVENOUS

## 2023-07-22 LAB — CBC
HCT: 29.4 % — ABNORMAL LOW (ref 36.0–46.0)
Hemoglobin: 9.7 g/dL — ABNORMAL LOW (ref 12.0–15.0)
MCH: 30.3 pg (ref 26.0–34.0)
MCHC: 33 g/dL (ref 30.0–36.0)
MCV: 91.9 fL (ref 80.0–100.0)
Platelets: 124 10*3/uL — ABNORMAL LOW (ref 150–400)
RBC: 3.2 MIL/uL — ABNORMAL LOW (ref 3.87–5.11)
RDW: 16.9 % — ABNORMAL HIGH (ref 11.5–15.5)
WBC: 16.1 10*3/uL — ABNORMAL HIGH (ref 4.0–10.5)
nRBC: 0.4 % — ABNORMAL HIGH (ref 0.0–0.2)

## 2023-07-22 LAB — CULTURE, BLOOD (ROUTINE X 2)
Culture: NO GROWTH
Culture: NO GROWTH

## 2023-08-12 NOTE — Plan of Care (Signed)
 Likely transition to comfort care in next day or two.  Problem: Education: Goal: Ability to describe self-care measures that may prevent or decrease complications (Diabetes Survival Skills Education) will improve Outcome: Progressing   Problem: Coping: Goal: Ability to adjust to condition or change in health will improve Outcome: Progressing   Problem: Fluid Volume: Goal: Ability to maintain a balanced intake and output will improve Outcome: Progressing   Problem: Metabolic: Goal: Ability to maintain appropriate glucose levels will improve Outcome: Progressing   Problem: Nutritional: Goal: Maintenance of adequate nutrition will improve Outcome: Progressing Goal: Progress toward achieving an optimal weight will improve Outcome: Progressing   Problem: Skin Integrity: Goal: Risk for impaired skin integrity will decrease Outcome: Progressing   Problem: Clinical Measurements: Goal: Respiratory complications will improve Outcome: Progressing Goal: Cardiovascular complication will be avoided Outcome: Progressing

## 2023-08-12 NOTE — Progress Notes (Signed)
 Daily Progress Note   Patient Name: Joyce Rodriguez       Date: 07/15/2023 DOB: [birth year]  Age: 78 y.o. MRN#: [id number] Attending Physician: Tarry Kos, MD Primary Care Physician: Marguarite Arbour, MD Admit Date: 07/17/2023  Reason for Consultation/Follow-up: Establishing goals of care  Subjective: Medical records reviewed including progress notes, labs, imaging. Patient assessed at the bedside. She remains intubated, now on comfort care with morphine gtt. Two sons are present visiting, as well as her nephew.  Family confirm that decision has been made for compassionate extubation and transition comfort care today. They are waiting for more family to visit before proceeding with extubation. They are committed to patient's wishes and at peace with their decision. Family requests prayers and have no other needs at this time.  Questions and concerns addressed. Discussed with MD. PMT will continue to support holistically.   Length of Stay: 3   Physical Exam Vitals and nursing note reviewed.  Constitutional:      Appearance: She is ill-appearing.     Interventions: She is intubated.  Cardiovascular:     Rate and Rhythm: Normal rate.  Pulmonary:     Effort: Pulmonary effort is normal. She is intubated.  Skin:    General: Skin is cool and dry.  Neurological:     Mental Status: She is alert.            Vital Signs: BP (!) 142/89   Pulse (!) 102   Temp 98.9 F (37.2 C) (Oral)   Resp (!) 24   Ht 5\' 6"  (1.676 m)   Wt 63.2 kg   SpO2 95%   BMI 22.49 kg/m  SpO2: SpO2: 95 % O2 Device: O2 Device: Ventilator O2 Flow Rate: O2 Flow Rate (L/min): 4 L/min      Palliative Assessment/Data: 10-20%   Palliative Care Assessment & Plan   Patient Profile: 78 y.o. female  with past medical history of severe COPD, chronic respiratory failure,  CAD, GERD, HLD, and HTN who presented to [facility] ED on 07/17/2023 after a fall at home.  She was found to have a left femoral neck fracture, bilateral pelvic fractures, and extraperitoneal hematoma. She was placed on BiPAP in the ED due to increased work of breathing. She was transferred to [facility] for orthopedics consult and hip repair.    Since admission, patient's mental status has been significantly altered from baseline. MRI brain on 4/7 showed evidence of punctuate acute infarct within the right frontal white matter and left thalamus. Per neurology, unlikely that stroke seen on MRI is sole cause of the confusion.    Palliative Medicine was consulted for GOC and medical decision making.   Assessment: Goals of care conversation Hip fracture s/p arthroplasty  BL pelvic fractures severe COPD, acute exacerbation Acute on chronic respiratory failure  Recommendations/Plan: Continue DNR Family has decided for transition to comfort care and compassionate extubation after additional family visits throughout the day Agree with morphine gtt and other PRN comfort medications per [facility] Psychosocial and emotional support provided PMT will continue to follow and support   Prognosis:  Poor  Discharge Planning: To Be Determined  Care plan was discussed with Patient's family, MD  Anirudh Baiz Jeni Salles, PA-C  Palliative Medicine Team Team phone # [phone]  Thank you for allowing the Palliative Medicine Team to assist in the care of this patient. Please utilize secure chat with additional questions, if there is no response within 30 minutes please call the above phone number.  Palliative Medicine Team providers are available by phone from 7am to 7pm daily and can be reached through the team cell phone.  Should this patient require assistance outside of these hours, please call the patient's attending physician.

## 2023-08-12 NOTE — Progress Notes (Addendum)
 Pt extubated to comfort care with RN, MD and family at bedside per MD order.

## 2023-08-12 NOTE — Progress Notes (Addendum)
 NAME:  Joyce Rodriguez, MRN:  [id number], DOB:  02-17-1946, LOS: 3 ADMISSION DATE:  07/17/2023, CONSULTATION DATE:  07/19/2023 REFERRING MD:  Rolly Salter, MD, CHIEF COMPLAINT:  FALL   History of Present Illness:  Joyce Rodriguez is a 78 year old female with a history of COPD, hypertension, hyperlipidemia, and GERD who was admitted for a fall and found to have left femoral neck fracture.  CT head was unremarkable for any acute abnormalities aside for small vessel that seemed chronic.  Her MRI head also showed acute infract in the right frontal white matter and left thalamus.  She had arthroplasty of the left hip and remained intubated postop due to acute hypoxic and hypercapnic respiratory failure and transferred to ICU for respiratory support.  Pertinent  Medical History   Past Medical History:  Diagnosis Date   Aortic atherosclerosis (HCC)    Arthritis    Complication of anesthesia    difficulty waking up   COPD (chronic obstructive pulmonary disease) (HCC)    Coronary artery disease    Dyspnea    Emphysema of lung (HCC)    SEVERE   GERD (gastroesophageal reflux disease)    Headache    H/O   History of hiatal hernia    Hyperlipidemia    Hypertension    H/O OFF MEDS SINCE 2020 BY PCP   Osteoporosis    Oxygen deficiency    uses O2 at night   Oxygen dependent    Pneumonia    Umbilical hernia      Significant Hospital Events: Including procedures, antibiotic start and stop dates in addition to other pertinent events   ARTHROPLASTY, HIP, TOTAL, ANTERIOR APPROACH (Left)   Interim History / Subjective:  Remained mechanically ventilated . Blinks to commands  Objective   Blood pressure (!) 142/89, pulse (!) 102, temperature 98.9 F (37.2 C), temperature source Oral, resp. rate (!) 24, height 5\' 6"  (1.676 m), weight 63.2 kg, SpO2 95%.    Vent Mode: PRVC FiO2 (%):  [30 %-50 %] 30 % Set Rate:  [24 bmp] 24 bmp Vt Set:  [470 mL] 470 mL PEEP:  [5 cmH20] 5 cmH20 Plateau Pressure:   [20 cmH20-23 cmH20] 20 cmH20   Intake/Output Summary (Last 24 hours) at 08/03/2023 0911 Last data filed at 07/18/2023 0400 Gross per 24 hour  Intake 1701.36 ml  Output 700 ml  Net 1001.36 ml   Filed Weights   07/17/23 2356 07/20/23 0400 07/25/2023 0500  Weight: 68 kg 62.4 kg 63.2 kg    Examination: General: Minimally responsive Lungs: Lungs are clear to auscultation. Cardiovascular: Normal heart sounds, no murmurs appreciated Abdomen: Soft nontender Extremities: No lower extremity edema noted, Neuro: Alert, nonverbal, blinks to commands  Hospital Problem list   Left hip fracture status post arthroplasty COPD Chronic hypoxic respiratory failure  Assessment & Plan:  This patient has COPD that has progressively gotten worse over time and potentially made her fall in the setting on dyspnea and weakness.  She remained intubated postop and attempt to extubate seems unreasonable at this time given her current state.  Family is at bedside and palliative is involved regarding discussions of goals of care.  Repeat VBG shows continues hypercapnia . Family wishes to resort to comfort care at this time per patient's wishes  - Family plans to transition to comfort care today - Extubate and initiate morphine drip   Best Practice (right click and "Reselect all SmartList Selections" daily)   Diet/type: NPO DVT prophylaxis: not indicated GI prophylaxis: N/A  Lines: Arterial Line Foley:  Yes, and it is still needed Code Status:  DNR Last date of multidisciplinary goals of care discussion : Palliative is following possible discussions for comfort care to follow  Labs   CBC: Recent Labs  Lab 07/17/23 1707 07/18/23 0028 07/18/23 0035 07/19/23 1220 07/19/23 1744 07/19/23 1753 07/20/23 0451 07/20/23 0516 07/20/23 1035 08/07/2023 0348  WBC 21.9* 28.5*   < > 23.5*  --  26.2* 15.9*  --  18.2* 16.1*  NEUTROABS 18.2* 27.1*  --   --   --   --   --   --   --   --   HGB 12.0 9.0*   < > 9.1*   < >  8.0* 7.1* 6.8* 7.2* 9.7*  HCT 39.2 29.2*   < > 29.0*   < > 26.8* 23.3* 20.0* 23.4* 29.4*  MCV 101.3* 100.7*   < > 97.3  --  101.5* 97.9  --  97.5 91.9  PLT 307 200   < > 152  --  129* 112*  --  128* 124*   < > = values in this interval not displayed.    Basic Metabolic Panel: Recent Labs  Lab 07/17/23 1707 07/18/23 0028 07/18/23 0035 07/19/23 0734 07/19/23 1744 07/20/23 0323 07/20/23 0516 07/18/2023 0348  NA 139 136   < > 143 142 144 142 146*  K 4.1 4.3   < > 5.0 4.3 4.3 4.2 3.5  CL 97* 98  --  104  --  106  --  107  CO2 34* 31  --  27  --  27  --  26  GLUCOSE 246* 217*  --  111*  --  129*  --  181*  BUN 17 17  --  33*  --  35*  --  45*  CREATININE 0.60 0.80  --  0.87  --  0.74  --  0.84  CALCIUM 8.9 8.0*  --  8.9  --  9.0  --  9.1  MG  --   --   --  2.2  --  2.0  --  2.0  PHOS  --   --   --   --   --   --   --  2.9   < > = values in this interval not displayed.   GFR: Estimated Creatinine Clearance: 53.3 mL/min (by C-G formula based on SCr of 0.84 mg/dL). Recent Labs  Lab 07/17/23 1855 07/17/23 1907 07/18/23 0028 07/18/23 0342 07/18/23 0529 07/18/23 1324 07/19/23 1753 07/20/23 0451 07/20/23 1035 07/18/2023 0348  PROCALCITON  --   --  1.72  --   --   --   --   --   --   --   WBC  --   --  28.5*   < >  --    < > 26.2* 15.9* 18.2* 16.1*  LATICACIDVEN 2.6* 2.1*  --   --  1.9  --   --   --   --   --    < > = values in this interval not displayed.    Liver Function Tests: Recent Labs  Lab 07/17/23 1707 07/18/23 0028 07/19/23 0734 07/20/23 0323 08/10/2023 0348  AST 23 19 18 17 24   ALT 27 23 20 20 23   ALKPHOS 54 35* 36* 22* 30*  BILITOT 0.5 0.5 0.6 0.6 0.7  PROT 6.4* 4.5* 4.8* 5.0* 5.4*  ALBUMIN 3.5 2.2* 2.6* 2.9* 3.1*   No results for input(s): "LIPASE", "  AMYLASE" in the last 168 hours. Recent Labs  Lab 07/18/23 1746  AMMONIA 29    ABG    Component Value Date/Time   PHART 7.392 07/20/2023 0516   PCO2ART 48.2 (H) 07/20/2023 0516   PO2ART 59 (L)  07/20/2023 0516   HCO3 31.8 (H) 07/20/2023 1042   TCO2 31 07/20/2023 0516   O2SAT 84.7 07/20/2023 1042     Coagulation Profile: Recent Labs  Lab 07/17/23 1707 07/18/23 0028  INR 1.1 1.1    Cardiac Enzymes: No results for input(s): "CKTOTAL", "CKMB", "CKMBINDEX", "TROPONINI" in the last 168 hours.  HbA1C: Hgb A1c MFr Bld  Date/Time Value Ref Range Status  07/18/2023 03:42 AM 5.7 (H) 4.8 - 5.6 % Final    Comment:    (NOTE) Pre diabetes:          5.7%-6.4%  Diabetes:              >6.4%  Glycemic control for   <7.0% adults with diabetes     CBG: Recent Labs  Lab 07/20/23 1530 07/20/23 1951 08/06/2023 0007 07/27/2023 0405 07/28/2023 0734  GLUCAP 153* 164* 168* 190* 162*    Review of Systems:   Unable to assess   Past Medical History:  She,  has a past medical history of Aortic atherosclerosis (HCC), Arthritis, Complication of anesthesia, COPD (chronic obstructive pulmonary disease) (HCC), Coronary artery disease, Dyspnea, Emphysema of lung (HCC), GERD (gastroesophageal reflux disease), Headache, History of hiatal hernia, Hyperlipidemia, Hypertension, Osteoporosis, Oxygen deficiency, Oxygen dependent, Pneumonia, and Umbilical hernia.   Surgical History:   Past Surgical History:  Procedure Laterality Date   ABDOMINAL HYSTERECTOMY     CHOLECYSTECTOMY     ESOPHAGEAL MANOMETRY N/A 09/13/2017   Procedure: ESOPHAGEAL MANOMETRY (EM);  Surgeon: Napoleon Form, MD;  Location: [facility];  Service: Endoscopy;  Laterality: N/A;   ESOPHAGOGASTRODUODENOSCOPY (EGD) WITH PROPOFOL N/A 05/11/2017   Procedure: ESOPHAGOGASTRODUODENOSCOPY (EGD) WITH PROPOFOL;  Surgeon: Toney Reil, MD;  Location: [facility] ENDOSCOPY;  Service: Gastroenterology;  Laterality: N/A;   EYE SURGERY Bilateral    CATARACTS   FOOT SURGERY     MORTONS NEUROMA   LAPAROSCOPIC NISSEN FUNDOPLICATION N/A 10/19/2017   Procedure: LAPAROSCOPIC NISSEN FUNDOPLICATION;  Surgeon: Leafy Ro, MD;  Location:  [facility] ORS;  Service: General;  Laterality: N/A;   NISSEN FUNDOPLICATION N/A 10/19/2017   Procedure: NISSEN FUNDOPLICATION;  Surgeon: Leafy Ro, MD;  Location: [facility] ORS;  Service: General;  Laterality: N/A;   REVERSE SHOULDER ARTHROPLASTY Left 09/10/2020   Procedure: REVERSE SHOULDER ARTHROPLASTY;  Surgeon: Christena Flake, MD;  Location: [facility] ORS;  Service: Orthopedics;  Laterality: Left;   TOTAL HIP ARTHROPLASTY Left 07/19/2023   Procedure: ARTHROPLASTY, HIP, TOTAL, ANTERIOR APPROACH;  Surgeon: Tarry Kos, MD;  Location: [facility] OR;  Service: Orthopedics;  Laterality: Left;     Social History:   reports that she quit smoking about 22 years ago. Her smoking use included cigarettes. She started smoking about 52 years ago. She has a 45 pack-year smoking history. She has never used smokeless tobacco. She reports current alcohol use. She reports that she does not use drugs.   Family History:  Her family history includes Emphysema in her father; Lung cancer in her brother; Melanoma in her sister; Ovarian cancer in her mother.   Allergies Allergies  Allergen Reactions   Atorvastatin Other (See Comments)    Myalgias made legs and back hurt    Atrovent Nasal Spray [Ipratropium]     Difficulty breathing  Codeine Nausea And Vomiting    Pt states that she can tolerate Tramadol fine.   Lipitor [Atorvastatin Calcium] Other (See Comments)    Myalgias     Home Medications  Prior to Admission medications   Medication Sig Start Date End Date Taking? Authorizing Provider  acetaminophen (TYLENOL) 500 MG tablet Take 1,000 mg by mouth every 6 (six) hours as needed for moderate pain.   Yes [provider]  albuterol (VENTOLIN HFA) 108 (90 Base) MCG/ACT inhaler Inhale 2 puffs into the lungs every 4 (four) hours as needed for wheezing or shortness of breath. 04/19/23 07/18/23 Yes Yevonne Pax, MD  ALPRAZolam Prudy Feeler) 0.25 MG tablet Take 0.5 tablets (0.125 mg total) by mouth 2 (two) times daily as  needed for anxiety. 08/13/22  Yes Yevonne Pax, MD  amitriptyline (ELAVIL) 25 MG tablet Take 25 mg by mouth at bedtime.   Yes [provider]  Budeson-Glycopyrrol-Formoterol (BREZTRI AEROSPHERE) 160-9-4.8 MCG/ACT AERO Inhale 2 puffs into the lungs 2 (two) times daily. 04/19/23  Yes Yevonne Pax, MD  busPIRone (BUSPAR) 10 MG tablet Take 1 tablet by mouth 2 (two) times daily. 06/23/23 06/22/24 Yes [provider]  carvedilol (COREG) 25 MG tablet Take 25 mg by mouth 2 (two) times daily.   Yes [provider]  Docusate Calcium (STOOL SOFTENER PO) Take 2 tablets by mouth daily.   Yes [provider]  escitalopram (LEXAPRO) 10 MG tablet Take 1 tablet (10 mg total) by mouth daily. Patient taking differently: Take 10 mg by mouth every morning. 11/24/18  Yes Crissman, Redge Gainer, MD  ezetimibe (ZETIA) 10 MG tablet Take 10 mg by mouth every morning. 11/02/19  Yes [provider]  furosemide (LASIX) 20 MG tablet Take 20 mg by mouth daily. 06/23/23  Yes [provider]  omeprazole (PRILOSEC) 20 MG capsule TAKE 1 CAPSULE BY MOUTH DAILY AS NEEDED FOR HEARTBURN 07/08/23  Yes Abernathy, Alyssa, NP  OXYGEN Inhale 2.5-3 L into the lungs at bedtime. 3 L portable   uses [employer] for oxygen   Yes [provider]  predniSONE (DELTASONE) 10 MG tablet Take 1 tablet (10 mg total) by mouth daily with breakfast. 04/19/23  Yes Yevonne Pax, MD  Roflumilast 250 MCG TABS Take 1 tablet by mouth daily. 02/26/23  Yes Abernathy, Arlyss Repress, NP  theophylline (THEO-24) 100 MG 24 hr capsule Take 1 capsule (100 mg total) by mouth daily. Take 1 capsule by mouth daily. 02/26/23  Yes Yevonne Pax, MD  traMADol (ULTRAM) 50 MG tablet Take 1-2 tablets (50-100 mg total) by mouth every 6 (six) hours as needed for moderate pain. 09/11/20  Yes Anson Oregon, PA-C  zinc gluconate 50 MG tablet Take 50 mg by mouth daily.   Yes [provider]     Critical care time: 40  minutes     Kathleen Lime MD [facility] Internal Medicine Program - PGY-1 07/28/2023, 9:11 AM Pager# [phone]

## 2023-08-12 NOTE — Progress Notes (Signed)
 Pharmacy Electrolyte Replacement  Recent Labs:  Recent Labs    08/03/2023 0348  K 3.5  MG 2.0  PHOS 2.9  CREATININE 0.84    Low Critical Values (K </= 2.5, Phos </= 1, Mg </= 1) Present: None  MD Contacted: n/a  Plan: 40 mEq KCL per tube x1   Cedric Fishman, PharmD, BCPS, BCCCP Clinical Pharmacist

## 2023-08-12 NOTE — Progress Notes (Signed)
 STROKE TEAM PROGRESS NOTE    SIGNIFICANT HOSPITAL EVENTS 4/5-patient admitted with left femoral fracture 4/7-repair of left femoral fracture completed, patient found to have several punctate infarcts  INTERIM HISTORY/SUBJECTIVE Multiple family members are at the bedside.  Patient had expressed her wish of not to be kept on prolonged ventilatory support and aggressive care and family has decided to make her DNR and she is going to be extubated later this morning. Patient remains intubated and poorly responsive. OBJECTIVE  CBC    Component Value Date/Time   WBC 16.1 (H) 08/05/2023 0348   RBC 3.20 (L) 07/28/2023 0348   HGB 9.7 (L) 08/11/2023 0348   HGB 13.0 01/23/2019 0938   HCT 29.4 (L) 07/20/2023 0348   HCT 39.8 01/23/2019 0938   PLT 124 (L) 07/29/2023 0348   PLT 399 01/23/2019 0938   MCV 91.9 07/25/2023 0348   MCV 95 01/23/2019 0938   MCH 30.3 07/24/2023 0348   MCHC 33.0 07/27/2023 0348   RDW 16.9 (H) 08/08/2023 0348   RDW 11.8 01/23/2019 0938   LYMPHSABS 0.2 (L) 07/18/2023 0028   LYMPHSABS 0.8 01/23/2019 0938   MONOABS 1.0 07/18/2023 0028   EOSABS 0.0 07/18/2023 0028   EOSABS 0.0 01/23/2019 0938   BASOSABS 0.1 07/18/2023 0028   BASOSABS 0.0 01/23/2019 0938    BMET    Component Value Date/Time   NA 146 (H) 07/27/2023 0348   NA 136 01/23/2019 0938   K 3.5 08/11/2023 0348   CL 107 07/22/2023 0348   CO2 26 08/10/2023 0348   GLUCOSE 181 (H) 08/06/2023 0348   BUN 45 (H) 07/23/2023 0348   BUN 15 01/23/2019 0938   CREATININE 0.84 07/16/2023 0348   CALCIUM 9.1 08/06/2023 0348   GFRNONAA >60 07/24/2023 0348    IMAGING past 24 hours No results found.   Vitals:   07/20/2023 0830 08/04/2023 0900 08/11/2023 1000 08/02/2023 1030  BP: (!) 148/87 (!) 153/90 (!) 170/100 (!) 166/98  Pulse: (!) 110 (!) 109 (!) 111 (!) 108  Resp: (!) 24 (!) 21 (!) 24 (!) 24  Temp:      TempSrc:      SpO2: 96% 97% 96% 95%  Weight:      Height:         PHYSICAL EXAM General: Ill-appearing,  intubated elderly patient in no acute distress Psych:  Mood and affect appropriate for situation CV: Regular rate and rhythm on monitor Respiratory: Respirations synchronous with ventilator   NEURO intubated Patient will open eyes to voice but does not follow commands.  Pupils equal round and reactive to light in midline position.  Cough and gag present, patient is able to localize sternal rub with bilateral upper extremities, will withdraw bilateral lower extremities to noxious  Most Recent NIH  1a Level of Conscious.: 1 1b LOC Questions: 2 1c LOC Commands: 2 2 Best Gaze: 1 3 Visual: 0 4 Facial Palsy: 0 5a Motor Arm - left: 2 5b Motor Arm - Right: 2 6a Motor Leg - Left: 3 6b Motor Leg - Right: 3 7 Limb Ataxia: 0 8 Sensory: 0 9 Best Language: 3 10 Dysarthria: 2 11 Extinct. and Inatten.: 0 TOTAL: 21   ASSESSMENT/PLAN  Ms. Joyce Rodriguez is a 78 y.o. female with history of COPD, chronic respiratory failure, CAD, hypertension, hyperlipidemia and GERD who was initially admitted after a fall with a left femoral fracture.  She was then found to be confused, and MRI demonstrates punctate infarcts in the left thalamus and right frontal  lobe.  Infarcts are likely due to small vessel disease, possibly in the setting of transient hypotension from opioid pain medication use and do not explain patient's altered mental status.  More likely etiology of this is toxic metabolic encephalopathy.  She has now undergone repair of her left femoral fracture and is intubated and sedated with low-dose Precedex.  Will further evaluate deficits due to stroke when patient has been extubated.  Acute Ischemic Infarct: Punctate acute infarcts within right frontal white matter and left thalamus Etiology: Likely synchronous small vessel disease in the setting of probable transient hypotension from pain medication use.  Doubt fat embolism or cardiogenic embolism due to mostly subcortical nature of the strokes CT  head No acute abnormality. Small vessel disease. Atrophy.  Chronic left cerebellar infarct CTA head & neck pending MRI punctate acute infarcts in right frontal white matter and left thalamus, old cerebellar infarct on the left and chronic small vessel ischemic disease 2D Echo EF 55%, mild LVH, mild mitral valve regurgitation, mild to moderate tricuspid valve regurgitation, trivial aortic valve regurgitation, normal left atrial size, no atrial level shunt LDL 63 HgbA1c 5.7 VTE prophylaxis -SCDs No antithrombotic prior to admission, now on No antithrombotic due to postoperative hemoglobin of 6.8 today, would like to start aspirin 81 mg daily  when primary team approves Therapy recommendations:  Pending Disposition: Pending  Hx of Stroke/TIA 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  Hypertension Home meds: Carvedilol 25 mg twice daily Stable Blood Pressure Goal: BP less than 220/110   Hyperlipidemia Home meds: Ezetimibe 10 mg daily LDL 63, goal < 70 High intensity statin not indicated as LDL below goal Continue statin at discharge  Respiratory failure Patient left intubated after repair of left femoral fracture Currently in weaning mode on the ventilator Ventilator management per CCM Extubate when able  Dysphagia Patient has post-stroke dysphagia, SLP consulted    Diet   Diet NPO time specified   Advance diet as tolerated  Other Stroke Risk Factors Coronary artery disease   Other Active Problems Toxic metabolic encephalopathy with likely component of delirium-delirium precautions and treatment of toxic metabolic encephalopathy per primary team  Hospital day # 3   Patient presented with a fall due to hip fracture and subsequently developed altered mental status after receiving Dilaudid.  MRI scan shows tiny punctate right frontal and left thalamic lacunar infarcts likely from small vessel disease in the setting of possible hypertension from pain medication usage.  Family  has decided to make patient DNR and extubated and likely comfort care measures.  Long discussion with patient and multiple family members at the bedside and answered questions.  Discussed with Dr. Denese Killings critical care medicine.  Stroke team will sign off.  Kindly call for questions  This patient is critically ill and at significant risk of neurological worsening, death and care requires constant monitoring of vital signs, hemodynamics,respiratory and cardiac monitoring, extensive review of multiple databases, frequent neurological assessment, discussion with family, other specialists and medical decision making of high complexity.I have made any additions or clarifications directly to the above note.This critical care time does not reflect procedure time, or teaching time or supervisory time of PA/NP/Med Resident etc but could involve care discussion time.  I spent 30 minutes of neurocritical care time  in the care of  this patient.      Delia Heady, MD Medical Director [facility] Pager: [phone] 07/26/2023 12:49 PM   To contact Stroke Continuity provider, please refer to [email or web address].  After hours, contact General Neurology

## 2023-08-12 NOTE — Progress Notes (Signed)
 PT Cancellation Note  Patient Details Name: Joyce Rodriguez MRN: [id number] DOB: [birth year]   Cancelled Treatment:    Reason Eval/Treat Not Completed: Medical issues which prohibited therapy. Signing off for PT.   Angelina Ok [facility] 07/27/2023, 10:13 AM Skip Mayer PT Acute [employer] [phone]

## 2023-08-12 NOTE — Progress Notes (Signed)
 Subjective: 2 Days Post-Op Procedure(s) (LRB): ARTHROPLASTY, HIP, TOTAL, ANTERIOR APPROACH (Left) Patient currently intubated. Son at bedside this am.      Objective: Vital signs in last 24 hours: Temp:  [97.9 F (36.6 C)-100.1 F (37.8 C)] 98.9 F (37.2 C) (04/09 0736) Pulse Rate:  [84-146] 112 (04/09 0645) Resp:  [10-38] 35 (04/09 0645) BP: (79-187)/(48-107) 159/93 (04/09 0645) SpO2:  [91 %-100 %] 96 % (04/09 0645) FiO2 (%):  [30 %-50 %] 30 % (04/09 0500) Weight:  [63.2 kg] 63.2 kg (04/09 0500)  Intake/Output from previous day: 04/08 0701 - 04/09 0700 In: 1878.7 [I.V.:576.2; Blood:315; NG/GT:387.6; IV Piggyback:599.9] Out: 700 [Urine:700] Intake/Output this shift: No intake/output data recorded.  Recent Labs    07/19/23 1753 07/20/23 0451 07/20/23 0516 07/20/23 1035 08/08/2023 0348  HGB 8.0* 7.1* 6.8* 7.2* 9.7*   Recent Labs    07/20/23 1035 08/04/2023 0348  WBC 18.2* 16.1*  RBC 2.40* 3.20*  HCT 23.4* 29.4*  PLT 128* 124*   Recent Labs    07/20/23 0323 07/20/23 0516 08/08/2023 0348  NA 144 142 146*  K 4.3 4.2 3.5  CL 106  --  107  CO2 27  --  26  BUN 35*  --  45*  CREATININE 0.74  --  0.84  GLUCOSE 129*  --  181*  CALCIUM 9.0  --  9.1   No results for input(s): "LABPT", "INR" in the last 72 hours.  Incision: dressing C/D/I No cellulitis present Compartment soft   Assessment/Plan: 2 Days Post-Op Procedure(s) (LRB): ARTHROPLASTY, HIP, TOTAL, ANTERIOR APPROACH (Left) Weightbearing: WBAT LLE Insicional and dressing care: Dressings left intact until follow-up Orthopedic device(s): None Showering: pod #3 VTE prophylaxis: Aspirin 81mg  qd per primary/neuro rec Pain control: will defer to primary team Follow - up plan: 2 weeks Contact information:  Glee Arvin MD, Corrie Dandy Nakita Santerre PA-C       Cristie Hem 07/16/2023, 7:44 AM

## 2023-08-12 NOTE — Death Summary Note (Signed)
  Name: Joyce Rodriguez MRN: [id number] DOB: 05/28/1945 78 y.o.  Date of Admission: 07/17/2023 11:47 PM Date of Discharge: 08/07/2023 Attending Physician: Tarry Kos, MD  Hospital course: Principal Problem:   Closed subcapital fracture of femur, left, initial encounter [facility]) Active Problems:   Major depression, chronic   Essential hypertension   COPD (chronic obstructive pulmonary disease) (HCC)   Hyperlipidemia   Chronic respiratory failure with hypoxia and hypercapnia (HCC)   COPD exacerbation (HCC)   CAD (coronary artery disease), native coronary artery   Fall   Acute metabolic encephalopathy   Bilateral pubic rami fractures, closed, initial encounter (HCC)   Hematoma of extraperitoneal space   Normocytic anemia   Sinus tachycardia   Fx femoral neck (HCC)   Cause of death: Acute hypoxic respiratory failure  Time of death: 15:35 PM   Ms Joyce Rodriguez is 78 year old woman who presented with hip fracture as a result of a fall presumptively from hypoxia and hypercarbia from and acute exacerbation of severe COPD. She was on BIPAP and increasingly hypercarbic prior to surgery. Post operatively she  failed to tolerate even a brief SBT despite antibiotics, bronchodilators and steroids. Didn't respond to a trial of diuretics.  Upon various discussions with palliative care, family decided to pursue comfort care measures  only based on the patient's wishes.  The patient was extubated around 13:21 PM on 07/15/2023 and transitioned to morphine drip for comfort.She succumbed at 15:35 PM with her family surrounding her at bedside.    Signed: Kathleen Lime ,MD 07/22/2023, 4:38 PM

## 2023-08-12 NOTE — Progress Notes (Signed)
 Nutrition Brief Note  Chart reviewed. Pt now transitioning to comfort care.  No further nutrition interventions planned at this time.  Please re-consult as needed.   Greig Castilla, RD, LDN Registered Dietitian II Please reach out via secure chat

## 2023-08-12 DEATH — deceased

## 2023-08-25 ENCOUNTER — Other Ambulatory Visit: Payer: Self-pay | Admitting: Nurse Practitioner

## 2023-08-25 DIAGNOSIS — J441 Chronic obstructive pulmonary disease with (acute) exacerbation: Secondary | ICD-10-CM

## 2023-09-07 ENCOUNTER — Ambulatory Visit: Admitting: Internal Medicine

## 2023-09-13 ENCOUNTER — Ambulatory Visit: Admitting: Physician Assistant
